# Patient Record
Sex: Female | Born: 1935 | Race: Black or African American | Hispanic: No | Smoking: Former smoker
Health system: Southern US, Community
[De-identification: ages and names within clinical notes are randomized; demographics above are authoritative.]

## PROBLEM LIST (undated history)

## (undated) DIAGNOSIS — E785 Hyperlipidemia, unspecified: Secondary | ICD-10-CM

## (undated) DIAGNOSIS — M1990 Unspecified osteoarthritis, unspecified site: Secondary | ICD-10-CM

## (undated) DIAGNOSIS — I739 Peripheral vascular disease, unspecified: Secondary | ICD-10-CM

## (undated) DIAGNOSIS — H547 Unspecified visual loss: Secondary | ICD-10-CM

## (undated) DIAGNOSIS — I2510 Atherosclerotic heart disease of native coronary artery without angina pectoris: Secondary | ICD-10-CM

## (undated) DIAGNOSIS — T7840XA Allergy, unspecified, initial encounter: Secondary | ICD-10-CM

## (undated) DIAGNOSIS — E119 Type 2 diabetes mellitus without complications: Secondary | ICD-10-CM

## (undated) DIAGNOSIS — I10 Essential (primary) hypertension: Secondary | ICD-10-CM

## (undated) DIAGNOSIS — I219 Acute myocardial infarction, unspecified: Secondary | ICD-10-CM

## (undated) DIAGNOSIS — I639 Cerebral infarction, unspecified: Secondary | ICD-10-CM

## (undated) DIAGNOSIS — H409 Unspecified glaucoma: Secondary | ICD-10-CM

## (undated) HISTORY — DX: Unspecified visual loss: H54.7

## (undated) HISTORY — DX: Unspecified glaucoma: H40.9

## (undated) HISTORY — DX: Cerebral infarction, unspecified: I63.9

## (undated) HISTORY — DX: Atherosclerotic heart disease of native coronary artery without angina pectoris: I25.10

## (undated) HISTORY — DX: Unspecified osteoarthritis, unspecified site: M19.90

## (undated) HISTORY — DX: Allergy, unspecified, initial encounter: T78.40XA

## (undated) HISTORY — DX: Hyperlipidemia, unspecified: E78.5

## (undated) HISTORY — DX: Peripheral vascular disease, unspecified: I73.9

## (undated) HISTORY — PX: EYE SURGERY: SHX253

## (undated) HISTORY — DX: Acute myocardial infarction, unspecified: I21.9

---

## 2014-04-24 ENCOUNTER — Emergency Department (HOSPITAL_COMMUNITY)
Admission: EM | Admit: 2014-04-24 | Discharge: 2014-04-24 | Disposition: A | Discharge status: Home / Self Care | Payer: Medicare Other | Attending: Emergency Medicine | Admitting: Emergency Medicine

## 2014-04-24 ENCOUNTER — Encounter (HOSPITAL_COMMUNITY): Payer: Self-pay | Admitting: Emergency Medicine

## 2014-04-24 DIAGNOSIS — Z79899 Other long term (current) drug therapy: Secondary | ICD-10-CM | POA: Diagnosis not present

## 2014-04-24 DIAGNOSIS — I10 Essential (primary) hypertension: Secondary | ICD-10-CM | POA: Diagnosis not present

## 2014-04-24 DIAGNOSIS — Z7902 Long term (current) use of antithrombotics/antiplatelets: Secondary | ICD-10-CM | POA: Diagnosis not present

## 2014-04-24 DIAGNOSIS — E119 Type 2 diabetes mellitus without complications: Secondary | ICD-10-CM | POA: Insufficient documentation

## 2014-04-24 DIAGNOSIS — I959 Hypotension, unspecified: Secondary | ICD-10-CM | POA: Diagnosis not present

## 2014-04-24 HISTORY — DX: Type 2 diabetes mellitus without complications: E11.9

## 2014-04-24 HISTORY — DX: Essential (primary) hypertension: I10

## 2014-04-24 LAB — COMPREHENSIVE METABOLIC PANEL
ALK PHOS: 102 U/L (ref 39–117)
ALT: 17 U/L (ref 0–35)
AST: 12 U/L (ref 0–37)
Albumin: 3 g/dL — ABNORMAL LOW (ref 3.5–5.2)
Anion gap: 12 (ref 5–15)
BUN: 8 mg/dL (ref 6–23)
CO2: 23 meq/L (ref 19–32)
Calcium: 9.1 mg/dL (ref 8.4–10.5)
Chloride: 101 mEq/L (ref 96–112)
Creatinine, Ser: 0.64 mg/dL (ref 0.50–1.10)
GFR, EST NON AFRICAN AMERICAN: 81 mL/min — AB (ref >90)
GLUCOSE: 224 mg/dL — AB (ref 70–99)
POTASSIUM: 3.7 meq/L (ref 3.7–5.3)
Sodium: 136 mEq/L — ABNORMAL LOW (ref 137–147)
TOTAL PROTEIN: 6.5 g/dL (ref 6.0–8.3)
Total Bilirubin: 0.6 mg/dL (ref 0.3–1.2)

## 2014-04-24 LAB — CBC WITH DIFFERENTIAL/PLATELET
BASOS ABS: 0 10*3/uL (ref 0.0–0.1)
Basophils Relative: 0 % (ref 0–1)
Eosinophils Absolute: 0.1 10*3/uL (ref 0.0–0.7)
Eosinophils Relative: 1 % (ref 0–5)
HCT: 33.5 % — ABNORMAL LOW (ref 36.0–46.0)
Hemoglobin: 11.2 g/dL — ABNORMAL LOW (ref 12.0–15.0)
LYMPHS ABS: 0.6 10*3/uL — AB (ref 0.7–4.0)
Lymphocytes Relative: 10 % — ABNORMAL LOW (ref 12–46)
MCH: 31.3 pg (ref 26.0–34.0)
MCHC: 33.4 g/dL (ref 30.0–36.0)
MCV: 93.6 fL (ref 78.0–100.0)
Monocytes Absolute: 0.4 10*3/uL (ref 0.1–1.0)
Monocytes Relative: 7 % (ref 3–12)
NEUTROS ABS: 4.8 10*3/uL (ref 1.7–7.7)
NEUTROS PCT: 82 % — AB (ref 43–77)
PLATELETS: 173 10*3/uL (ref 150–400)
RBC: 3.58 MIL/uL — AB (ref 3.87–5.11)
RDW: 12.9 % (ref 11.5–15.5)
WBC: 5.9 10*3/uL (ref 4.0–10.5)

## 2014-04-24 LAB — URINALYSIS, ROUTINE W REFLEX MICROSCOPIC
Bilirubin Urine: NEGATIVE
GLUCOSE, UA: NEGATIVE mg/dL
Hgb urine dipstick: NEGATIVE
KETONES UR: NEGATIVE mg/dL
LEUKOCYTES UA: NEGATIVE
NITRITE: NEGATIVE
PH: 7 (ref 5.0–8.0)
Protein, ur: NEGATIVE mg/dL
Specific Gravity, Urine: 1.011 (ref 1.005–1.030)
Urobilinogen, UA: 1 mg/dL (ref 0.0–1.0)

## 2014-04-24 LAB — I-STAT TROPONIN, ED: Troponin i, poc: 0.01 ng/mL (ref 0.00–0.08)

## 2014-04-24 NOTE — ED Notes (Signed)
Per EMS: Pt from home.  Moved here from [place] on Monday.  States that her BP was 110 systolic last night and this morning it was 60/40.  Initial EMS pressure on scene was 68 systolic.  IV and fluids started now up to 132/54.  Denies pain.

## 2014-04-24 NOTE — Progress Notes (Signed)
  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  BP was 110 systolic last night and this morning it was 60/40. Initial EMS pressure on scene was 68 systolic     Subjective/Objective Assessment Detail:   Pt without a [facility] pcp When pt left [place] she was provided with home health orders but has not had services started     Action/Plan:   ED CM consulted by EDP, [place] to provide medicare pcps ED CM spoke with pt and niece at bedside about medicare pcps Provided a list of in network medicare providers within zip [phone]   Action/Plan Detail:   Discussed that when pt not working her medicare is now primary coverage vs bcbs Discussed other resources for pt See note below [facility] choices provided Pending return call from Niece to provide [facility] Spoke with EDP about HH orders   Anticipated DC Date:  04/24/2014     Status Recommendation to Physician:   Result of Recommendation:    Other ED Services  Consult Working Plan    DC Planning Services  Other  Outpatient Services - Pt will follow up  PCP issues   [facility]  HOME HEALTH   Choice offered to / List presented to:  C-1 Patient     HH arranged  HH-1 RN  HH-2 PT  HH-6 SOCIAL WORKER       Status of service:  Completed, signed off  ED Comments:   ED Comments Detail:  Choice offered to / List presented to pt/niece CM reviewed in details medicare guidelines, home health [facility](HH) (length of stay in home, types of [facility] available, coverage, primary caregiver, up to 24 hrs before services may be started), CM provided family with a list of [place] home health agencies, information on delivering pharmacies, visiting doctors, chs outpatient pharmacies, DSS, health department, [facility]  services [facility] the [facility], [facility], financial assistance and medication resources Discussed EDP renewing home health orders that can be called in the home health agency of choice EDP entered home health EPIC orders for [facility], PT, SW

## 2014-04-24 NOTE — Discharge Instructions (Signed)
Hypotension  As your heart beats, it forces blood through your arteries. This force is your blood pressure. If your blood pressure is too low for you to go about your normal activities or to support the organs of your body, you have hypotension. Hypotension is also referred to as low blood pressure. When your blood pressure becomes too low, you may not get enough blood to your brain. As a result, you may feel weak, feel lightheaded, or develop a rapid heart rate. In a more severe case, you may faint.  CAUSES  Various conditions can cause hypotension. These include:  · Blood loss.  · Dehydration.  · Heart or endocrine problems.  · Pregnancy.  · Severe infection.  · Not having a well-balanced diet filled with needed nutrients.  · Severe allergic reactions (anaphylaxis).  Some medicines, such as blood pressure medicine or water pills (diuretics), may lower your blood pressure below normal. Sometimes taking too much medicine or taking medicine not as directed can cause hypotension.  TREATMENT   Hospitalization is sometimes required for hypotension if fluid or blood replacement is needed, if time is needed for medicines to wear off, or if further monitoring is needed. Treatment might include changing your diet, changing your medicines (including medicines aimed at raising your blood pressure), and use of support stockings.  HOME CARE INSTRUCTIONS   · Drink enough fluids to keep your urine clear or pale yellow.  · Take your medicines as directed by your health care provider.  · Get up slowly from reclining or sitting positions. This gives your blood pressure a chance to adjust.  · Wear support stockings as directed by your health care provider.  · Maintain a healthy diet by including nutritious food, such as fruits, vegetables, nuts, whole grains, and lean meats.  SEEK MEDICAL CARE IF:  · You have vomiting or diarrhea.  · You have a fever for more than 2-3 days.  · You feel more thirsty than usual.  · You feel weak and  tired.  SEEK IMMEDIATE MEDICAL CARE IF:   · You have chest pain or a fast or irregular heartbeat.  · You have a loss of feeling in some part of your body, or you lose movement in your arms or legs.  · You have trouble speaking.  · You become sweaty or feel lightheaded.  · You faint.  MAKE SURE YOU:   · Understand these instructions.  · Will watch your condition.  · Will get help right away if you are not doing well or get worse.  Document Released: 09/05/2005 Document Revised: 06/26/2013 Document Reviewed: 03/08/2013  ExitCare® Patient Information ©2015 [employer]. This information is not intended to replace advice given to you by your health care provider. Make sure you discuss any questions you have with your health care provider.

## 2014-04-24 NOTE — ED Provider Notes (Signed)
  Face-to-face evaluation   History: EMS was called, this morning, because the patient was talking slowly and "almost passed out". EMS arrived and found her to be hypotensive. She arrived from [place] about 4 days ago, and family members are helping her. She has a large bag of medicine, with many duplicate medicines and the family member with her, is not sure if she is taking some or all of these medicines. When I saw the patient, at 13:45, she was alert, conversant, and stated she was hungry. She denied chest pain, nausea, vomiting, weakness, or dizziness. She recalls speaking slowly, earlier, but feels that she is talking normally at this time.  Physical exam: Elderly female in no apparent distress. Heart regular in rhythm. No murmur. Lungs clear to auscultation. Neurologic is without focal asymmetry. She is alert and cooperative. There is no dysarthria or aphasia.  Medical screening examination/treatment/procedure(s) were conducted as a shared visit with non-physician practitioner(s) and myself.  I personally evaluated the patient during the encounter  Flint MelterElliott L Cleatus Gabriel, MD 04/25/14 2136

## 2014-04-24 NOTE — ED Notes (Signed)
I tried getting blood from patient but was un successful.

## 2014-04-24 NOTE — ED Notes (Signed)
Bed: [id number] Expected date:  Expected time:  Means of arrival:  Comments: 60/40

## 2014-04-24 NOTE — ED Provider Notes (Signed)
CSN: [id number]     Arrival date & time 04/24/14  1124 History   First MD Initiated Contact with Patient 04/24/14 1137     Chief Complaint  Patient presents with  . Hypotension     (Consider location/radiation/quality/duration/timing/severity/associated sxs/prior Treatment) HPI Comments: Patient brought in today with a chief complaint of hypotension.  She reports that she has been having fatigue and generalized weakness since last evening.  She reports that this morning she was talking more slowly than usual and her niece then called EMS.  Upon EMS arrival the patient was found to have a blood pressure of 60/40.  She reports that at that time she felt like she was going to "pass out."  She was given IVF and blood pressure improved.  Blood pressure 142/82 upon arrival in the ED.  She denies focal weakness, dizziness, lightheadedness, vomiting, diarrhea, chest pain, headache, vision changes, fever, chills, SOB, or abdominal pain.  She reports that no new changes to her medications.  She is currently on Clonidine, Amlodipine, and Labetalol for HTN.  She thinks that she has been taking medications as prescribed.  She reports that she has been eating and drinking normally.  [place] one week ago.    The history is provided by the patient.    Past Medical History  Diagnosis Date  . Diabetes mellitus without complication   . Hypertension    History reviewed. No pertinent past surgical history. History reviewed. No pertinent family history. History  Substance Use Topics  . Smoking status: Never Smoker   . Smokeless tobacco: Not on file  . Alcohol Use: No   OB History   Grav Para Term Preterm Abortions TAB SAB Ect Mult Living                 Review of Systems  All other systems reviewed and are negative.     Allergies  Milk-related compounds  Home Medications   Prior to Admission medications   Medication Sig Start Date End Date Taking?  Authorizing Provider  acetaminophen (TYLENOL) 500 MG tablet Take 500 mg by mouth every 6 (six) hours as needed.   Yes Historical Provider, MD  ALPRAZolam Prudy Feeler(XANAX) 0.5 MG tablet Take 0.5 mg by mouth 3 (three) times daily as needed for anxiety.   Yes Historical Provider, MD  amLODipine (NORVASC) 10 MG tablet Take 10 mg by mouth daily.   Yes Historical Provider, MD  calcium carbonate (TUMS - DOSED IN MG ELEMENTAL CALCIUM) 500 MG chewable tablet Chew 1 tablet by mouth daily as needed for indigestion or heartburn.   Yes Historical Provider, MD  cloNIDine (CATAPRES) 0.2 MG tablet Take 0.2 mg by mouth every 8 (eight) hours.   Yes Historical Provider, MD  clopidogrel (PLAVIX) 75 MG tablet Take 75 mg by mouth daily.   Yes Historical Provider, MD  docusate sodium (COLACE) 100 MG capsule Take 300 mg by mouth at bedtime.   Yes Historical Provider, MD  famotidine (PEPCID) 20 MG tablet Take 20 mg by mouth daily.   Yes Historical Provider, MD  haloperidol (HALDOL) 0.5 MG tablet Take 0.5-1 mg by mouth 3 (three) times daily.   Yes Historical Provider, MD  hydrALAZINE (APRESOLINE) 10 MG tablet Take 10 mg by mouth 3 (three) times daily.   Yes Historical Provider, MD  labetalol (NORMODYNE) 200 MG tablet Take 400 mg by mouth 3 (three) times daily.   Yes Historical Provider, MD  omeprazole (PRILOSEC) 20 MG  capsule Take 20 mg by mouth daily.   Yes Historical Provider, MD  phenytoin (DILANTIN) 100 MG ER capsule Take 100 mg by mouth 2 (two) times daily.   Yes Historical Provider, MD  simvastatin (ZOCOR) 20 MG tablet Take 20 mg by mouth at bedtime.   Yes Historical Provider, MD  nitrofurantoin, macrocrystal-monohydrate, (MACROBID) 100 MG capsule Take 100 mg by mouth 2 (two) times daily. Starting 04/16/2014 for 7 days. 04/16/14   Historical Provider, MD   BP 142/82  Pulse 65  Temp(Src) 97.4 F (36.3 C) (Oral)  Resp 15  SpO2 100% Physical Exam  Nursing note and vitals reviewed. Constitutional: She is oriented to person,  place, and time. She appears well-developed and well-nourished.  HENT:  Head: Normocephalic and atraumatic.  Mouth/Throat: Oropharynx is clear and moist.  Eyes: EOM are normal. Pupils are equal, round, and reactive to light.  Neck: Normal range of motion. Neck supple.  Cardiovascular: Normal rate, regular rhythm and normal heart sounds.   Pulmonary/Chest: Effort normal and breath sounds normal.  Abdominal: Soft. Bowel sounds are normal. She exhibits no distension and no mass. There is no tenderness. There is no rebound and no guarding.  Musculoskeletal: Normal range of motion.  Neurological: She is alert and oriented to person, place, and time. She has normal strength. No cranial nerve deficit or sensory deficit. Coordination normal.  Normal gait with walker  Skin: Skin is warm and dry.  Psychiatric: She has a normal mood and affect.    ED Course  Procedures (including critical care time) Labs Review Labs Reviewed  CBC WITH DIFFERENTIAL  COMPREHENSIVE METABOLIC PANEL  URINALYSIS, ROUTINE W REFLEX MICROSCOPIC  I-STAT TROPOININ, ED    Imaging Review No results found.   EKG Interpretation None     Filed Vitals:   04/24/14 1359  BP: 164/67  Pulse: 63  Temp:   Resp: 13   2:00 Pm Reassessed patient.  Patient has been able to tolerate PO fluids and eat. 2:30 PM Patient ambulated without difficulty using her walker.  MDM   Final diagnoses:  None   Patient presenting with a chief complaint of hypotension.  She was found to be hypotensive by EMS, however, she was given IVF and blood pressure improved.  Blood pressure 142/82 upon arrival in the ED.  Blood pressure remained stable during ED course.  Labs today unremarkable.  UA negative.  No focal deficits on exam.  Patient able to eat and drink prior to discharge.  Patient able to ambulate using her walker.  Patient stable for discharge.  Return precautions given.  Patient also evaluated by Dr. Effie Shy who is in agreement with  the plan.    Santiago Glad, PA-C 04/25/14 [phone]

## 2014-04-24 NOTE — ED Notes (Signed)
Patient tried to urinate, but was unable to.

## 2014-04-24 NOTE — ED Notes (Signed)
Pt ambulated well with walker.

## 2014-05-03 ENCOUNTER — Emergency Department (HOSPITAL_COMMUNITY): Payer: Federal, State, Local not specified - PPO

## 2014-05-03 ENCOUNTER — Emergency Department (HOSPITAL_COMMUNITY)
Admission: EM | Admit: 2014-05-03 | Discharge: 2014-05-03 | Disposition: A | Discharge status: Home / Self Care | Payer: Federal, State, Local not specified - PPO | Attending: Emergency Medicine | Admitting: Emergency Medicine

## 2014-05-03 ENCOUNTER — Encounter (HOSPITAL_COMMUNITY): Payer: Self-pay | Admitting: Emergency Medicine

## 2014-05-03 DIAGNOSIS — I10 Essential (primary) hypertension: Secondary | ICD-10-CM | POA: Insufficient documentation

## 2014-05-03 DIAGNOSIS — Z79899 Other long term (current) drug therapy: Secondary | ICD-10-CM | POA: Insufficient documentation

## 2014-05-03 DIAGNOSIS — M6281 Muscle weakness (generalized): Secondary | ICD-10-CM | POA: Diagnosis not present

## 2014-05-03 DIAGNOSIS — E119 Type 2 diabetes mellitus without complications: Secondary | ICD-10-CM | POA: Insufficient documentation

## 2014-05-03 DIAGNOSIS — R55 Syncope and collapse: Secondary | ICD-10-CM | POA: Diagnosis not present

## 2014-05-03 DIAGNOSIS — R9431 Abnormal electrocardiogram [ECG] [EKG]: Secondary | ICD-10-CM

## 2014-05-03 DIAGNOSIS — Z7901 Long term (current) use of anticoagulants: Secondary | ICD-10-CM | POA: Insufficient documentation

## 2014-05-03 LAB — COMPREHENSIVE METABOLIC PANEL
ALK PHOS: 110 U/L (ref 39–117)
ALT: 8 U/L (ref 0–35)
AST: 11 U/L (ref 0–37)
Albumin: 3.4 g/dL — ABNORMAL LOW (ref 3.5–5.2)
Anion gap: 14 (ref 5–15)
BUN: 14 mg/dL (ref 6–23)
CHLORIDE: 102 meq/L (ref 96–112)
CO2: 24 meq/L (ref 19–32)
Calcium: 9.6 mg/dL (ref 8.4–10.5)
Creatinine, Ser: 0.76 mg/dL (ref 0.50–1.10)
GFR calc Af Amer: 89 mL/min — ABNORMAL LOW (ref >90)
GFR, EST NON AFRICAN AMERICAN: 77 mL/min — AB (ref >90)
Glucose, Bld: 155 mg/dL — ABNORMAL HIGH (ref 70–99)
POTASSIUM: 4.2 meq/L (ref 3.7–5.3)
SODIUM: 140 meq/L (ref 137–147)
Total Bilirubin: 0.4 mg/dL (ref 0.3–1.2)
Total Protein: 7.1 g/dL (ref 6.0–8.3)

## 2014-05-03 LAB — CBC WITH DIFFERENTIAL/PLATELET
Basophils Absolute: 0 10*3/uL (ref 0.0–0.1)
Basophils Relative: 0 % (ref 0–1)
Eosinophils Absolute: 0.1 10*3/uL (ref 0.0–0.7)
Eosinophils Relative: 1 % (ref 0–5)
HEMATOCRIT: 38.6 % (ref 36.0–46.0)
HEMOGLOBIN: 13.1 g/dL (ref 12.0–15.0)
Lymphocytes Relative: 9 % — ABNORMAL LOW (ref 12–46)
Lymphs Abs: 0.7 10*3/uL (ref 0.7–4.0)
MCH: 31.8 pg (ref 26.0–34.0)
MCHC: 33.9 g/dL (ref 30.0–36.0)
MCV: 93.7 fL (ref 78.0–100.0)
MONOS PCT: 6 % (ref 3–12)
Monocytes Absolute: 0.5 10*3/uL (ref 0.1–1.0)
Neutro Abs: 6.5 10*3/uL (ref 1.7–7.7)
Neutrophils Relative %: 84 % — ABNORMAL HIGH (ref 43–77)
Platelets: 216 10*3/uL (ref 150–400)
RBC: 4.12 MIL/uL (ref 3.87–5.11)
RDW: 13.1 % (ref 11.5–15.5)
WBC: 7.8 10*3/uL (ref 4.0–10.5)

## 2014-05-03 LAB — TROPONIN I
Troponin I: <0.3 ng/mL (ref <0.30)
Troponin I: <0.3 ng/mL (ref <0.30)

## 2014-05-03 LAB — PRO B NATRIURETIC PEPTIDE: Pro B Natriuretic peptide (BNP): 81.7 pg/mL (ref 0–450)

## 2014-05-03 MED ORDER — SODIUM CHLORIDE 0.9 % IV BOLUS (SEPSIS)
500.0000 mL | Freq: Once | INTRAVENOUS | Status: AC
  Administered 2014-05-03: 500 mL via INTRAVENOUS

## 2014-05-03 NOTE — ED Notes (Signed)
Bed: [id number] Expected date: 05/03/14 Expected time: 9:52 AM Means of arrival: Ambulance Comments: Near syncope

## 2014-05-03 NOTE — Consult Note (Signed)
[facility] Medical Consultation  Kaitlin Smith RUE:[id number] DOB: 03/26/1930 DOA: 05/03/2014 PCP: No PCP Per Patient   Requesting physician: Dr. Elesa Massed Date of consultation: 05/03/14 Reason for consultation: near syncope and T-waves inversions on EKG  Impression/Recommendations 1. Near syncope: most likely vaso-vagal in nature vs secondary to medications causing orthostatic changes. Family report that when EMs arrived and check Vital signs BP was low. Patient is also no walking or doing much of activity at all. -troponin neg, no SOB, EKG w/o any other acute ischemic process -cardiology curbside for EKG abnormality and recommending outpatient follow up.  -will need event monitoring -had already appointment set up to establish care with PCP on 05/05/14; at that moment will recommend medication adjustments for BP and evaluation of adrenal function. -check TSH -patient otherwise stable and ok to be discharge from ED.  2. Abnormal EKG: non-specific and with lack of further symptoms or abnormalities will not require further inpatient evaluation at this time. See above cardiology rec's  *Rest of medical problems remains stable and will recommend to continue current medication regimen and follow to establish care with primary provider and with cardiology as an outpatient.  Please contact me if I can be of assistance in the meanwhile. Thank you for this consultation.  Chief Complaint: near syncope   HPI:  Patient with pmh of HTN, HLD, diabetes and CAD; came to Ed after experiencing near syncope event. Patient relocated recently from [place] and will be staying in town now with nieces and nephews. Patient reports she woke this morning, walk downstairs and went to the bathroom and after standing when done felt as if her knees were giving up and felt herself fainting. She never loss consciousness or hit her head. Niece who was with her reports patient was able to heard them and follow commands during  event. Patient is wheelchair bound mainly and not used to do as much activities as she did today prior to event. Patient reports some constipation (which is not new for her); otherwise denies CP, palpitations, SOB, nausea, vomiting, HA's, abd pain, dysuria or any other complaints.  In the ED work up unremarkable and after IVF's give, patient was asymptomatic.  Review of Systems:  Negative except as mentioned otherwise on HPI  Past Medical History  Diagnosis Date  . Diabetes mellitus without complication   . Hypertension    History reviewed. No pertinent past surgical history. Social History:  reports that she has never smoked. She does not have any smokeless tobacco history on file. She reports that she does not drink alcohol or use illicit drugs.  Allergies  Allergen Reactions  . Milk-Related Compounds     Lactose intolerant.     FH: positive for HTN; otherwise non-contributory  Prior to Admission medications   Medication Sig Start Date End Date Taking? Authorizing Provider  acetaminophen (TYLENOL) 500 MG tablet Take 500 mg by mouth every 6 (six) hours as needed.   Yes Historical Provider, MD  amLODipine (NORVASC) 10 MG tablet Take 10 mg by mouth daily.   Yes Historical Provider, MD  cloNIDine (CATAPRES) 0.2 MG tablet Take 0.2 mg by mouth every 8 (eight) hours.   Yes Historical Provider, MD  clopidogrel (PLAVIX) 75 MG tablet Take 75 mg by mouth daily.   Yes Historical Provider, MD  docusate sodium (COLACE) 100 MG capsule Take 300 mg by mouth at bedtime.   Yes Historical Provider, MD  haloperidol (HALDOL) 0.5 MG tablet Take 0.5-1 mg by mouth 3 (three) times daily.  Yes Historical Provider, MD  hydrALAZINE (APRESOLINE) 10 MG tablet Take 10 mg by mouth 3 (three) times daily.   Yes Historical Provider, MD  labetalol (NORMODYNE) 200 MG tablet Take 400 mg by mouth 3 (three) times daily.   Yes Historical Provider, MD  omeprazole (PRILOSEC) 20 MG capsule Take 20 mg by mouth daily.   Yes  Historical Provider, MD  simvastatin (ZOCOR) 20 MG tablet Take 20 mg by mouth at bedtime.   Yes Historical Provider, MD   Physical Exam: Blood pressure 167/60, pulse 73, temperature 98.2 F (36.8 C), temperature source Oral, resp. rate 18, SpO2 100.00%. Filed Vitals:   05/03/14 1238  BP: 167/60  Pulse: 73  Temp: 98.2 F (36.8 C)  Resp: 18     General:  Legally blind, AAOX3, afebrile, able to follow commands properly and denying any CP, SOB, palpitations, nausea, vomiting, abd pain or dysuria  Eyes: no icterus, no nystagmus, pupils are equally round and reactive to light  ENT: no erythema, no exudates; no thrush; patient w/o nasal congestion or ears discharge  Neck: no JVD, no bruits.  Cardiovascular: regular rate, no rubs or gallops, s1 and s2 appreciated   Respiratory: CTA bilaterally  Abdomen: soft, NT, ND, positive BS  Skin: no rash, petechiae or open wounds appreciated on exam  Musculoskeletal: no joint swelling or erythema  Psychiatric: normal mood and affect  Neurologic: patient blind, otherwise no focal motor or sensory deficit seen on exam   Labs on Admission:  Basic Metabolic Panel:  Recent Labs Lab 05/03/14 1025  NA 140  K 4.2  CL 102  CO2 24  GLUCOSE 155*  BUN 14  CREATININE 0.76  CALCIUM 9.6   Liver Function Tests:  Recent Labs Lab 05/03/14 1025  AST 11  ALT 8  ALKPHOS 110  BILITOT 0.4  PROT 7.1  ALBUMIN 3.4*   CBC:  Recent Labs Lab 05/03/14 1025  WBC 7.8  NEUTROABS 6.5  HGB 13.1  HCT 38.6  MCV 93.7  PLT 216   Cardiac Enzymes:  Recent Labs Lab 05/03/14 1025  TROPONINI <0.30   Radiological Exams on Admission: Dg Chest 2 View  05/03/2014   CLINICAL DATA:  Near syncope.  Hypertension.  EXAM: CHEST  2 VIEW  COMPARISON:  None.  FINDINGS: Cardiac silhouette areas normal in size. No mediastinal or hilar masses. Lungs are clear. No pleural effusion or pneumothorax.  Bony thorax is demineralized but intact.  IMPRESSION: No acute  cardiopulmonary disease.   Electronically Signed   By: Amie Portlandavid  Ormond M.D.   On: 05/03/2014 11:01    EKG:  Slight PR interval prolongation; left axis, sinus rhythm and regular rate. Non-specific T waves inversions on lateral leads.  Time spent: 45 minutes  Vassie LollMadera, Aviv Lengacher [facility] Pager [phone]  If 7PM-7AM, please contact night-coverage [email or web address] Password [email or web address] 05/03/2014, 2:07 PM

## 2014-05-03 NOTE — Discharge Instructions (Signed)
Please call your doctor for a followup appointment within 24-48 hours. When you talk to your doctor please let them know that you were seen in the emergency department and have them acquire all of your records so that they can discuss the findings with you and formulate a treatment plan to fully care for your new and ongoing problems. Please keep appointment with primary care provider on 05/05/2014 - please review over medications Please call and set-up an appointment with Cardiology - may ned to be placed on an event monitor Please continue to rest and stay hydrated  Please avoid any physical or strenuous activity  Please continue to monitor symptoms closely and if symptoms are to worsen or change (fever greater than 101, chills, sweating, nausea, vomiting, chest pain, shortness of breath, difficulty breathing, weakness, numbness, tingling, neck pain, neck stiffness, fall, injury, fainting) please report back to the ED immediately   Near-Syncope Near-syncope (commonly known as near fainting) is sudden weakness, dizziness, or feeling like you might pass out. During an episode of near-syncope, you may also develop pale skin, have tunnel vision, or feel sick to your stomach (nauseous). Near-syncope may occur when getting up after sitting or while standing for a long time. It is caused by a sudden decrease in blood flow to the brain. This decrease can result from various causes or triggers, most of which are not serious. However, because near-syncope can sometimes be a sign of something serious, a medical evaluation is required. The specific cause is often not determined. HOME CARE INSTRUCTIONS  Monitor your condition for any changes. The following actions may help to alleviate any discomfort you are experiencing:  Have someone stay with you until you feel stable.  Lie down right away and prop your feet up if you start feeling like you might faint. Breathe deeply and steadily. Wait until all the symptoms  have passed. Most of these episodes last only a few minutes. You may feel tired for several hours.   Drink enough fluids to keep your urine clear or pale yellow.   If you are taking blood pressure or heart medicine, get up slowly when seated or lying down. Take several minutes to sit and then stand. This can reduce dizziness.  Follow up with your health care provider as directed. SEEK IMMEDIATE MEDICAL CARE IF:   You have a severe headache.   You have unusual pain in the chest, abdomen, or back.   You are bleeding from the mouth or rectum, or you have black or tarry stool.   You have an irregular or very fast heartbeat.   You have repeated fainting or have seizure-like jerking during an episode.   You faint when sitting or lying down.   You have confusion.   You have difficulty walking.   You have severe weakness.   You have vision problems.  MAKE SURE YOU:   Understand these instructions.  Will watch your condition.  Will get help right away if you are not doing well or get worse. Document Released: 09/05/2005 Document Revised: 09/10/2013 Document Reviewed: 02/08/2013 [facility] Patient Information 2015 [place], [place]. This information is not intended to replace advice given to you by your health care provider. Make sure you discuss any questions you have with your health care provider.   Emergency Department Resource Guide 1) Find a Doctor and Pay Out of Pocket Although you won't have to find out who is covered by your insurance plan, it is a good idea to ask around and  get recommendations. You will then need to call the office and see if the doctor you have chosen will accept you as a new patient and what types of options they offer for patients who are self-pay. Some doctors offer discounts or will set up payment plans for their patients who do not have insurance, but you will need to ask so you aren't surprised when you get to your appointment.  2) Contact  Your Local Health Department Not all health departments have doctors that can see patients for sick visits, but many do, so it is worth a call to see if yours does. If you don't know where your local health department is, you can check in your phone book. The CDC also has a tool to help you locate your state's health department, and many state websites also have listings of all of their local health departments.  3) Find a Walk-in Clinic If your illness is not likely to be very severe or complicated, you may want to try a walk in clinic. These are popping up all over the country in pharmacies, drugstores, and shopping centers. They're usually staffed by nurse practitioners or physician assistants that have been trained to treat common illnesses and complaints. They're usually fairly quick and inexpensive. However, if you have serious medical issues or chronic medical problems, these are probably not your best option.  No Primary Care Doctor: - Call Health Connect at  [phone] - they can help you locate a primary care doctor that  accepts your insurance, provides certain services, etc. - Physician Referral Service- [phone]  Chronic Pain Problems: Organization         Address  Phone   Notes  [facility]  [phone]) [phone] Patients need to be referred by their primary care doctor.   Medication Assistance: Organization         Address  Phone   Notes  [facility] [address] [place], [place] [address] [phone] --Must be a resident of [place] -- Must have NO insurance coverage whatsoever (no Medicaid/ Medicare, etc.) -- The pt. MUST have a primary care doctor that directs their care regularly and follows them in the community   Med[facility]ist  [phone]   [employer]  [phone]    Agencies that provide inexpensive medical care: Organization         Address  Phone   Notes  [facility]  [phone])  [phone]   [facility]    [phone]   [facility] [address] [place], [place] [address] [phone]) [phone]   [facility] [address], [place] 660 [phone]   [facility]anned Parenthood    [phone](336) [phone]   [facility]    [phone]   [facility]  [address], [place] Phone:  [phone]) [phone], Fax:  [phone](336) [phone] Hours of Operation:  9 am - 6 pm, M-F.  Also accepts Medicaid/Medicare and self-pay.  [facility]  [address], [place] Phone: [phone]) [phone], Fax: [phone]) [phone]. Hours of Operation:  8:30 am - 5:30 pm, M-F.  Also accepts Medicaid and self-pay.  [facility] [place] [address], [place] Phone: [phone](336) [phone]   [facility] [address], [place], [place] [phone], Ext. [phone] Mondays & Thursdays: 7-9 AM.  First 15 patients are seen on a  first come, first serve basis.    Medicaid-accepting [facility] Providers:  Organization         Address  Phone   Notes  [facility] [address], [place] [phone] Also accepts self-pay patients.  [facility] [address] Laurell Josephs [place], [place]  [phone]   [facility] [address], [place] [phone]   [facility] [address], [place] [phone]   Renaye Rakers [address], [place]   [phone] Only accepts [place] Access [place] patients after they have their name applied to their card.   Self-Pay (no insurance) in [facility]:  Organization         Address  Phone   Notes  Sickle Cell Patients, [facility] [address] [place], [place] [phone]   [facility] [address] [place], [place] [phone]   [facility] [place]  [address], [place] [phone]   [facility]/Dr. Osei-Bonsu  [address], [place] or [address], [place] [phone] Phone number for both [place] and [place] locations is the same.  [facility] and [facility] [address] [place] [phone]   [facility] [address] [place] or [address] [phone]   [facility] [address], [place] [phone], phone; [phone], fax Sees patients 1st and 3rd Saturday of every month.  Must not qualify for public or private insurance (i.e. Medicaid, Medicare, [place] Health Choice, Veterans' Benefits)  Household income should be no more than 200% of the poverty level The clinic cannot treat you if you are pregnant or think you are pregnant  Sexually transmitted diseases are not treated at the clinic.    Dental Care: Organization         Address  Phone  Notes  [facility] Department of [facility] [address] [place], [place] [phone] Accepts children up to age 9 who are enrolled in [place] or [place] Health Choice; pregnant women with a Medicaid card; and children who have applied for Medicaid or [place] Health Choice, but were declined, whose parents can pay a reduced fee at time of service.  [facility] Department of [facility]  [address], [place] [phone] Accepts children up to age 47 who are enrolled in [place] or [place] Health Choice; pregnant women with a Medicaid card; and children who have applied for Medicaid or  Health Choice, but were declined, whose parents can pay a reduced fee at time of service.  [facility]  [address] [place], [place] [phone] Patients are seen by appointment only. Walk-ins are not accepted. [facility] will see patients 38 years of age and older. Monday - Tuesday (8am-5pm) Most  Wednesdays (8:30-5pm) $30 per visit, cash only  [facility] Access PRO[facility]  [address], [facility] [phone] Patients are seen by appointment only. Walk-ins are not accepted. [facility] will see patients 34 years of age and older. One Wednesday Evening (Monthly: Volunteer Based).  $30 per visit, cash only  [employer]  [phone] for adults; Children under age 92, call Graduate Pediatric Dentistry at [phone]. Children aged  4-14, please call [phone] to request a pediatric application.  Dental services are provided in all areas of dental care including fillings, crowns and bridges, complete and partial dentures, implants, gum treatment, root canals, and extractions. Preventive care is also provided. Treatment is provided to both adults and children. Patients are selected via a lottery and there is often a waiting list.   [facility] [address], [place]  [phone] [email or web address]   [facility] [address] [place], [place] [phone], Ext. [phone] Second and Fourth Thursday of each month, opens at 6:30 AM; Clinic ends at 9 AM.  Patients are seen on a first-come first-served basis, and a limited number are seen during each clinic.   [facility]  [address] Ether Griffins [place], [place] [phone]   Eligibility Requirements You must have lived in [place], [place], or [place] counties for at least the last three months.   You cannot be eligible for state or federal sponsored [place], including [employer], [place], or [employer].   You generally cannot be eligible for healthcare insurance through your employer.    How to apply: Eligibility screenings are held every Tuesday and Wednesday afternoon from 1:00 pm until 4:00 pm. You do not need an appointment for the interview!  [facility] [address], [place], [place] [phone]     [facility] Health Department  [phone]   [facility] Health Department  [phone]   [facility] Health Department  [phone]    Behavioral Health Resources in the Community: Intensive Outpatient Programs Organization         Address  Phone  Notes  [facility] Services [address] [place], [place] [phone]   [facility]patient [address] [place], [place] [phone]   [facility]: [facility] S[facility] [address], [place], [place]  [phone]   [facility] Mental Health [address]  [place], [place] [phone] or [phone]   Substance Abuse Resources Organization         Address  Phone  Notes  Al[facility] Drug Services  [phone]   [facility]  [phone]   [facility]  [phone]   Floydene Flock  [phone]   Residential & Outpatient Substance Abuse Program  [phone]   Psychological Services Organization         Address  Phone  Notes  [facility] Behavioral Health  [phone]   [facility] Services  [phone]   [facility] Mental Health [address] [place] [phone] or [phone]    Mobile Crisis Teams Organization         Address  Phone  Notes  [facility], Mobile Crisis Care Unit  [phone]   [facility]  [address] [place], [place] [phone]   Doristine Locks [address] [place] [phone]    Self-Help/Support Groups Organization         Address  Phone             Notes  [facility] [place] - variety of support groups  [phone]- I7437963 Call for more information  Narcotics Anonymous (NA), [facility] [address], [employer] [place]  2 meetings at this location   Statistician         Address  Phone  Notes  [facility] [address],    [place]  [phone]  [facility]  [address] [place]  [phone], [place], [place]   [place], [place] [phone] Admissions: 8am-3pm M-F  Incentives Substance [place] [address],    [place], [place] [phone]   [facility] [address] [place], [place], [place]   [facility] [address]  [place], [place]   [facility] - Intensive Outpatient [address], [place], [place]   [facility] ([address].,  [place], [place] [phone] or [phone]   [facility] (RTS) [address], [place], [place] Accepts Medicaid  [facility] [address]  [place] [phone] Substance Abuse/Addiction Treatment   [facility] Organization         Address  Phone  Notes  [facility]  [phone]   Domenic Schwab, PhD [address] Arlis Porta [place], [place]   [phone] or [phone]   [place] Jennette [place], [place] [phone]   [facility] [address] [place], [place] [phone] Insurance/Medicaid/sponsorship through [facility] and Families [address], [address]                                    [place], [place] [phone] [place] [address], [place] [phone]    Dr. Adele Schilder  [phone]   [facility] Dept. 1) [address], [place] 2) [place] 3)  [address], [place] [phone]  [phone]   [place] [phone] or [phone] (After Hours)

## 2014-05-03 NOTE — ED Provider Notes (Signed)
Medical screening examination/treatment/procedure(s) were conducted as a shared visit with non-physician practitioner(s) and myself.  I personally evaluated the patient during the encounter.   EKG Interpretation   Date/Time:  Saturday May 03 2014 10:10:17 EDT Ventricular Rate:  71 PR Interval:  239 QRS Duration: 99 QT Interval:  399 QTC Calculation: 434 R Axis:   -35 Text Interpretation:  Sinus rhythm Prolonged PR interval Left axis  deviation Probable anteroseptal infarct, old T wave inversions in lateral  leads Confirmed by Roshanna Cimino,  DO, Mck[place]ley Cottier ([id number]) on 05/03/2014 10:25:06 AM      Pt is a 80 y.o. F with history of hypertension, diabetes, CHF, CAD on Plavix who presents to the emergency department after a near syncopal event. She states that she went to the bathroom and when she got up she felt very lightheaded. She walked down the stairs but did not syncopized. She denies any chest pain, shortness of breath or palpitations. She is not orthostatic in the emergency department. She is recently here from out of town living with her family. She has EKG abnormalities but no old for comparison. Labs are unremarkable including negative troponin x 2 and normal BNP. Chest x-ray clear. Patient is still asymptomatic and hemodynamically stable. Orthostatic vital signs are negative. Discussed with cardiology on call Dr. Melburn PopperNasher who feels this can be managed as an outpatient. Hospitalist Dr Gwenlyn PerkingMadera has seen the patient and agrees. Patient has an appointment with her PCP in 2 days. Family comfortable with this plan. Discussed return precautions.  Layla MawKristen N Tremell Reimers, DO 05/03/14 1429

## 2014-05-03 NOTE — ED Notes (Signed)
PA at bedside.

## 2014-05-03 NOTE — ED Notes (Signed)
Provided pt with a sandwich per PA request. Also provided family with drinks and snacks while they are waiting on labs to result and d/c papers

## 2014-05-03 NOTE — ED Provider Notes (Signed)
CSN: [id number]     Arrival date & time 05/03/14  4098 History   First MD Initiated Contact with Patient 05/03/14 [id number]     Chief Complaint  Patient presents with  . Near Syncope     (Consider location/radiation/quality/duration/timing/severity/associated sxs/prior Treatment) The history is provided by the patient. No language interpreter was used.  Kaitlin Smith is an 80 y/o F with PMHx of DM, HTN presenting to the ED with a near syncopal episode that occurred this morning prior to arrival to the ED. Patient reported that she went to the bathroom and while washing her hands she had sudden onset of generalized weakness and felt as if she was going to faint. Patient reported that she is currently living with her niece, but stated that she was at her nephew's house this morning who was the individual that called EMS. Stated that she feels generalized weakness all over. Stated that she is wheelchair bound. Denied, fall, injuries, head injury, LOC, syncopal episode, chest pain, shortness of breath, difficulty breathing, numbness, tingling, neck pain, neck stiffness, abdominal pain, nausea, vomiting, diarrhea, melena, hematochezia, urinary symptoms, headache, dizziness, visual changes.  PCP none   Past Medical History  Diagnosis Date  . Diabetes mellitus without complication   . Hypertension    History reviewed. No pertinent past surgical history. No family history on file. History  Substance Use Topics  . Smoking status: Never Smoker   . Smokeless tobacco: Not on file  . Alcohol Use: No   OB History   Grav Para Term Preterm Abortions TAB SAB Ect Mult Living                 Review of Systems  Constitutional: Negative for fever and chills.  Respiratory: Negative for cough, chest tightness and shortness of breath.   Cardiovascular: Negative for chest pain.  Gastrointestinal: Negative for nausea, vomiting, abdominal pain, diarrhea, constipation, blood in stool and anal bleeding.   Musculoskeletal: Negative for back pain, neck pain and neck stiffness.  Neurological: Positive for weakness (Generalized). Negative for dizziness, syncope, numbness and headaches.      Allergies  Milk-related compounds  Home Medications   Prior to Admission medications   Medication Sig Start Date End Date Taking? Authorizing Provider  acetaminophen (TYLENOL) 500 MG tablet Take 500 mg by mouth every 6 (six) hours as needed.   Yes Historical Provider, MD  amLODipine (NORVASC) 10 MG tablet Take 10 mg by mouth daily.   Yes Historical Provider, MD  cloNIDine (CATAPRES) 0.2 MG tablet Take 0.2 mg by mouth every 8 (eight) hours.   Yes Historical Provider, MD  clopidogrel (PLAVIX) 75 MG tablet Take 75 mg by mouth daily.   Yes Historical Provider, MD  docusate sodium (COLACE) 100 MG capsule Take 300 mg by mouth at bedtime.   Yes Historical Provider, MD  haloperidol (HALDOL) 0.5 MG tablet Take 0.5-1 mg by mouth 3 (three) times daily.   Yes Historical Provider, MD  hydrALAZINE (APRESOLINE) 10 MG tablet Take 10 mg by mouth 3 (three) times daily.   Yes Historical Provider, MD  labetalol (NORMODYNE) 200 MG tablet Take 400 mg by mouth 3 (three) times daily.   Yes Historical Provider, MD  omeprazole (PRILOSEC) 20 MG capsule Take 20 mg by mouth daily.   Yes Historical Provider, MD  simvastatin (ZOCOR) 20 MG tablet Take 20 mg by mouth at bedtime.   Yes Historical Provider, MD   BP 145/62  Pulse 77  Temp(Src) 98.2 F (36.8 C) (Oral)  Resp 18  SpO2 97% Physical Exam  Nursing note and vitals reviewed. Constitutional: She is oriented to person, place, and time. She appears well-developed and well-nourished. No distress.  Patient laying comfortably in bed without any signs of distress  HENT:  Head: Normocephalic and atraumatic.  Mouth/Throat: Oropharynx is clear and moist. No oropharyngeal exudate.  Eyes: Conjunctivae and EOM are normal. Pupils are equal, round, and reactive to light. Right eye  exhibits no discharge. Left eye exhibits no discharge.  Neck: Normal range of motion. Neck supple. No tracheal deviation present.  Negative neck stiffness Negative nuchal rigidity  Negative cervical lymphadenopathy  Negative meningeal signs   Cardiovascular: Normal rate, regular rhythm and normal heart sounds.  Exam reveals no friction rub.   No murmur heard. Cap refill < 3 seconds Negative swelling or pitting edema noted to the lower extremities bilaterally   Pulmonary/Chest: Effort normal and breath sounds normal. No respiratory distress. She has no wheezes. She has no rales.  Musculoskeletal: Normal range of motion. She exhibits no edema and no tenderness.  Full ROM to upper and lower extremities without difficulty noted, negative ataxia noted.  Lymphadenopathy:    She has no cervical adenopathy.  Neurological: She is alert and oriented to person, place, and time. No cranial nerve deficit. She exhibits normal muscle tone. Coordination normal. GCS eye subscore is 4. GCS verbal subscore is 5. GCS motor subscore is 6.  Cranial nerves III-XII grossly intact Strength 5+/5+ to upper and lower extremities bilaterally with resistance applied, equal distribution noted Equal grip strength bilaterally  Negative facial drooping Negative slurred speech  Negative aphasia GCS 15 Patient is able to follow commands well  Patient answers questions appropriately   Skin: Skin is warm and dry. No rash noted. She is not diaphoretic. No erythema.  Psychiatric: She has a normal mood and affect. Her behavior is normal. Thought content normal.    ED Course  Procedures (including critical care time)  12:13 PM This provider spoke with Dr. Gwenlyn PerkingMadera, [facility] - discussed case, labs, imaging in great detail. Discussed irregularity in EKG and history. As per physician, reported that patient does not necessarily needs to come into the hospital. Reported that this is more so a outpatient work-up. Reported  that patient does not need to have admission.   12:18 PM This provider spoke with Cardiologist, Nahser. As per Cardiologist reported that patient does not need to be admitted - reported that this can be managed and followed as an outpatient. Reported that he would not perform a stress test on this female. Stated that this is a benign, nonspecific finding in the EKG.   1:01 PM Discussed case and Cardiology recommendations with Dr. Gwenlyn PerkingMadera. Dr. Gwenlyn PerkingMadera agreed to come and assess the patient.   2:04 PM This provider spoke with Dr. Gwenlyn PerkingMadera, hospitalist who saw and assessed patient. As per physician, reported that patient does not need to be admitted to the hospital regarding this issue. Reported that patient has an appointment with a primary care provider on Monday of this coming week. Reported that he's not concerned and feels that the patient is safe for discharge.  Results for orders placed during the hospital encounter of 05/03/14  CBC WITH DIFFERENTIAL      Result Value Ref Range   WBC 7.8  4.0 - 10.5 K/uL   RBC 4.12  3.87 - 5.11 MIL/uL   Hemoglobin 13.1  12.0 - 15.0 g/dL   HCT 40.938.6  81.136.0 - 91.446.0 %   MCV  93.7  78.0 - 100.0 fL   MCH 31.8  26.0 - 34.0 pg   MCHC 33.9  30.0 - 36.0 g/dL   RDW 84.6  96.2 - 95.2 %   Platelets 216  150 - 400 K/uL   Neutrophils Relative % 84 (*) 43 - 77 %   Neutro Abs 6.5  1.7 - 7.7 K/uL   Lymphocytes Relative 9 (*) 12 - 46 %   Lymphs Abs 0.7  0.7 - 4.0 K/uL   Monocytes Relative 6  3 - 12 %   Monocytes Absolute 0.5  0.1 - 1.0 K/uL   Eosinophils Relative 1  0 - 5 %   Eosinophils Absolute 0.1  0.0 - 0.7 K/uL   Basophils Relative 0  0 - 1 %   Basophils Absolute 0.0  0.0 - 0.1 K/uL  COMPREHENSIVE METABOLIC PANEL      Result Value Ref Range   Sodium 140  137 - 147 mEq/L   Potassium 4.2  3.7 - 5.3 mEq/L   Chloride 102  96 - 112 mEq/L   CO2 24  19 - 32 mEq/L   Glucose, Bld 155 (*) 70 - 99 mg/dL   BUN 14  6 - 23 mg/dL   Creatinine, Ser 8.41  0.50 - 1.10 mg/dL    Calcium 9.6  8.4 - 32.4 mg/dL   Total Protein 7.1  6.0 - 8.3 g/dL   Albumin 3.4 (*) 3.5 - 5.2 g/dL   AST 11  0 - 37 U/L   ALT 8  0 - 35 U/L   Alkaline Phosphatase 110  39 - 117 U/L   Total Bilirubin 0.4  0.3 - 1.2 mg/dL   GFR calc non Af Amer 77 (*) >90 mL/min   GFR calc Af Amer 89 (*) >90 mL/min   Anion gap 14  5 - 15  TROPONIN I      Result Value Ref Range   Troponin I <0.30  <0.30 ng/mL  PRO B NATRIURETIC PEPTIDE      Result Value Ref Range   Pro B Natriuretic peptide (BNP) 81.7  0 - 450 pg/mL  TROPONIN I      Result Value Ref Range   Troponin I <0.30  <0.30 ng/mL    Labs Review Labs Reviewed  CBC WITH DIFFERENTIAL - Abnormal; Notable for the following:    Neutrophils Relative % 84 (*)    Lymphocytes Relative 9 (*)    All other components within normal limits  COMPREHENSIVE METABOLIC PANEL - Abnormal; Notable for the following:    Glucose, Bld 155 (*)    Albumin 3.4 (*)    GFR calc non Af Amer 77 (*)    GFR calc Af Amer 89 (*)    All other components within normal limits  TROPONIN I  PRO B NATRIURETIC PEPTIDE  TROPONIN I    Imaging Review Dg Chest 2 View  05/03/2014   CLINICAL DATA:  Near syncope.  Hypertension.  EXAM: CHEST  2 VIEW  COMPARISON:  None.  FINDINGS: Cardiac silhouette areas normal in size. No mediastinal or hilar masses. Lungs are clear. No pleural effusion or pneumothorax.  Bony thorax is demineralized but intact.  IMPRESSION: No acute cardiopulmonary disease.   Electronically Signed   By: Amie Portland M.D.   On: 05/03/2014 11:01     EKG Interpretation   Date/Time:  Saturday May 03 2014 10:10:17 EDT Ventricular Rate:  71 PR Interval:  239 QRS Duration: 99 QT Interval:  399 QTC  Calculation: 434 R Axis:   -35 Text Interpretation:  Sinus rhythm Prolonged PR interval Left axis  deviation Probable anteroseptal infarct, old T wave inversions in lateral  leads Confirmed by WARD,  DO, KRISTEN ([id number]) on 05/03/2014 10:25:06 AM      MDM   Final  diagnoses:  Near syncope  Vaso vagal episode    Medications  sodium chloride 0.9 % bolus 500 mL (0 mLs Intravenous Stopped 05/03/14 1316)    Filed Vitals:   05/03/14 1230 05/03/14 1238 05/03/14 1245 05/03/14 1300  BP: 167/60 167/60  145/62  Pulse: 77 73 78 77  Temp:  98.2 F (36.8 C)    TempSrc:  Oral    Resp:  18    SpO2: 97% 100% 98% 97%   This provider reviewed patient's chart. Patient was seen and assessed in ED setting on 04/20/2014 regarding hypotension and near syncopal episode. Patient was given IV fluids and discharged home. EKG noted sinus rhythm with a heart rate of 71 beats per minutes-prolonged PR interval with left axis deviation. T-wave inversions in the lateral leads. Troponin negative elevation. Seconds troponin negative elevation. CBC negative findings-hemoglobin and hematocrit within normal limits. CMP unremarkable. BNP negative elevation. Chest x-ray unremarkable. Initially wanted to admit the patient, but Cardiology consulted with EKG findings and reported that these are benign nonspecific findings. [facility] saw and assessed patient and does not believe that patient needs to be admitted. Cardiology and Hospitalist reported that patient does not need to be admitted and that patient is safe for discharge.  Suspicion to be vaso-vagal maneuver. Labs re-assuring. Orthostatics unremarkable. Patient reported that she is well and feels okay to be discharged - family okay to discharge home. Patient stable, afebrile. Patient not septic appearing. Vitals stable in the ED setting - negative episodes of hypotension. Discharged patient. Discussed with patient to rest and stay hydrated. Discussed with patient to keep appointment for Monday, 05/05/2014. Discussed with patient to closely monitor symptoms and if symptoms are to worsen or change to report back to the ED - strict return instructions given.  Patient agreed to plan of care, understood, all questions answered.   Raymon Mutton, PA-C 05/03/14 1600

## 2014-05-03 NOTE — ED Notes (Signed)
Pt from home via EMS- Per EMS, pt states that she went to [facility], walked downstairs and then felt very tired like she was going to pass out. Pt did not have LOC. Pt is A&O and in NAD. Pt has no other c/o. Pt has hx of CHF, DM, HTN

## 2014-05-03 NOTE — Progress Notes (Signed)
CARE MANAGEMENT NOTE 05/03/2014  Patient:  Kaitlin Smith,Kaitlin Smith   Account Number:  [id number]  Date Initiated:  05/03/2014  Documentation initiated by:  [facility],Norman Bier  Subjective/Objective Assessment:   syncope     Action/Plan:   Anticipated DC Date:  05/03/2014   Anticipated DC Plan:  HOME/SELF CARE      DC Planning Services  CM consult  PCP issues      Choice offered to / List presented to:             Status of service:  Completed, signed off Medicare Important Message given?  NA - LOS <3 / Initial given by admissions (If response is "NO", the following Medicare IM given date fields will be blank) Date Medicare IM given:   Medicare IM given by:   Date Additional Medicare IM given:   Additional Medicare IM given by:    Discharge Disposition:  HOME/SELF CARE  Per UR Regulation:    If discussed at Long Length of Stay Meetings, dates discussed:    Comments:  05/03/2014 1755 Attempted call to number listed for pt. Left message on voicemail. Isidoro DonningAlesia Stacie Knutzen RN CCM Case Mgmt phone [phone]

## 2014-05-05 ENCOUNTER — Ambulatory Visit: Payer: Medicare Other | Admitting: Internal Medicine

## 2014-05-12 ENCOUNTER — Encounter: Payer: Self-pay | Admitting: Internal Medicine

## 2014-05-12 ENCOUNTER — Ambulatory Visit: Payer: Federal, State, Local not specified - PPO | Attending: Internal Medicine | Admitting: Internal Medicine

## 2014-05-12 VITALS — BP 142/82 | HR 75 | Temp 98.9°F | Resp 16 | Ht 67.0 in | Wt 159.0 lb

## 2014-05-12 DIAGNOSIS — M1710 Unilateral primary osteoarthritis, unspecified knee: Secondary | ICD-10-CM | POA: Insufficient documentation

## 2014-05-12 DIAGNOSIS — I252 Old myocardial infarction: Secondary | ICD-10-CM | POA: Insufficient documentation

## 2014-05-12 DIAGNOSIS — I10 Essential (primary) hypertension: Secondary | ICD-10-CM | POA: Insufficient documentation

## 2014-05-12 DIAGNOSIS — IMO0001 Reserved for inherently not codable concepts without codable children: Secondary | ICD-10-CM

## 2014-05-12 DIAGNOSIS — IMO0002 Reserved for concepts with insufficient information to code with codable children: Secondary | ICD-10-CM

## 2014-05-12 DIAGNOSIS — R739 Hyperglycemia, unspecified: Secondary | ICD-10-CM

## 2014-05-12 DIAGNOSIS — E119 Type 2 diabetes mellitus without complications: Secondary | ICD-10-CM | POA: Insufficient documentation

## 2014-05-12 DIAGNOSIS — Z8673 Personal history of transient ischemic attack (TIA), and cerebral infarction without residual deficits: Secondary | ICD-10-CM | POA: Diagnosis not present

## 2014-05-12 DIAGNOSIS — R7309 Other abnormal glucose: Secondary | ICD-10-CM

## 2014-05-12 DIAGNOSIS — H269 Unspecified cataract: Secondary | ICD-10-CM

## 2014-05-12 DIAGNOSIS — E739 Lactose intolerance, unspecified: Secondary | ICD-10-CM | POA: Diagnosis not present

## 2014-05-12 DIAGNOSIS — M1711 Unilateral primary osteoarthritis, right knee: Secondary | ICD-10-CM

## 2014-05-12 DIAGNOSIS — E1165 Type 2 diabetes mellitus with hyperglycemia: Secondary | ICD-10-CM

## 2014-05-12 LAB — POCT GLYCOSYLATED HEMOGLOBIN (HGB A1C): Hemoglobin A1C: 5.4

## 2014-05-12 LAB — GLUCOSE, POCT (MANUAL RESULT ENTRY): POC GLUCOSE: 141 mg/dL — AB (ref 70–99)

## 2014-05-12 NOTE — Patient Instructions (Signed)
DASH Eating Plan °DASH stands for "Dietary Approaches to Stop Hypertension." The DASH eating plan is a healthy eating plan that has been shown to reduce high blood pressure (hypertension). Additional health benefits may include reducing the risk of type 2 diabetes mellitus, heart disease, and stroke. The DASH eating plan may also help with weight loss. °WHAT DO I NEED TO KNOW ABOUT THE DASH EATING PLAN? °For the DASH eating plan, you will follow these general guidelines: °· Choose foods with a percent daily value for sodium of less than 5% (as listed on the food label). °· Use salt-free seasonings or herbs instead of table salt or sea salt. °· Check with your health care provider or pharmacist before using salt substitutes. °· Eat lower-sodium products, often labeled as "lower sodium" or "no salt added." °· Eat fresh foods. °· Eat more vegetables, fruits, and low-fat dairy products. °· Choose whole grains. Look for the word "whole" as the first word in the ingredient list. °· Choose fish and skinless chicken or turkey more often than red meat. Limit fish, poultry, and meat to 6 oz (170 g) each day. °· Limit sweets, desserts, sugars, and sugary drinks. °· Choose heart-healthy fats. °· Limit cheese to 1 oz (28 g) per day. °· Eat more home-cooked food and less restaurant, buffet, and fast food. °· Limit fried foods. °· Cook foods using methods other than frying. °· Limit canned vegetables. If you do use them, rinse them well to decrease the sodium. °· When eating at a restaurant, ask that your food be prepared with less salt, or no salt if possible. °WHAT FOODS CAN I EAT? °Seek help from a dietitian for individual calorie needs. °Grains °Whole grain or whole wheat bread. Brown rice. Whole grain or whole wheat pasta. Quinoa, bulgur, and whole grain cereals. Low-sodium cereals. Corn or whole wheat flour tortillas. Whole grain cornbread. Whole grain crackers. Low-sodium crackers. °Vegetables °Fresh or frozen vegetables  (raw, steamed, roasted, or grilled). Low-sodium or reduced-sodium tomato and vegetable juices. Low-sodium or reduced-sodium tomato sauce and paste. Low-sodium or reduced-sodium canned vegetables.  °Fruits °All fresh, canned (in natural juice), or frozen fruits. °Meat and Other Protein Products °Ground beef (85% or leaner), grass-fed beef, or beef trimmed of fat. Skinless chicken or turkey. Ground chicken or turkey. Pork trimmed of fat. All fish and seafood. Eggs. Dried beans, peas, or lentils. Unsalted nuts and seeds. Unsalted canned beans. °Dairy °Low-fat dairy products, such as skim or 1% milk, 2% or reduced-fat cheeses, low-fat ricotta or cottage cheese, or plain low-fat yogurt. Low-sodium or reduced-sodium cheeses. °Fats and Oils °Tub margarines without trans fats. Light or reduced-fat mayonnaise and salad dressings (reduced sodium). Avocado. Safflower, olive, or canola oils. Natural peanut or almond butter. °Other °Unsalted popcorn and pretzels. °The items listed above may not be a complete list of recommended foods or beverages. Contact your dietitian for more options. °WHAT FOODS ARE NOT RECOMMENDED? °Grains °White bread. White pasta. White rice. Refined cornbread. Bagels and croissants. Crackers that contain trans fat. °Vegetables °Creamed or fried vegetables. Vegetables in a cheese sauce. Regular canned vegetables. Regular canned tomato sauce and paste. Regular tomato and vegetable juices. °Fruits °Dried fruits. Canned fruit in light or heavy syrup. Fruit juice. °Meat and Other Protein Products °Fatty cuts of meat. Ribs, chicken wings, bacon, sausage, bologna, salami, chitterlings, fatback, hot dogs, bratwurst, and packaged luncheon meats. Salted nuts and seeds. Canned beans with salt. °Dairy °Whole or 2% milk, cream, half-and-half, and cream cheese. Whole-fat or sweetened yogurt. Full-fat   cheeses or blue cheese. Nondairy creamers and whipped toppings. Processed cheese, cheese spreads, or cheese  curds. °Condiments °Onion and garlic salt, seasoned salt, table salt, and sea salt. Canned and packaged gravies. Worcestershire sauce. Tartar sauce. Barbecue sauce. Teriyaki sauce. Soy sauce, including reduced sodium. Steak sauce. Fish sauce. Oyster sauce. Cocktail sauce. Horseradish. Ketchup and mustard. Meat flavorings and tenderizers. Bouillon cubes. Hot sauce. Tabasco sauce. Marinades. Taco seasonings. Relishes. °Fats and Oils °Butter, stick margarine, lard, shortening, ghee, and bacon fat. Coconut, palm kernel, or palm oils. Regular salad dressings. °Other °Pickles and olives. Salted popcorn and pretzels. °The items listed above may not be a complete list of foods and beverages to avoid. Contact your dietitian for more information. °WHERE CAN I FIND MORE INFORMATION? °[employer], [employer]: www.nhlbi.nih.gov/health/health-topics/topics/dash/ °Document Released: 08/25/2011 Document Revised: 01/20/2014 Document Reviewed: 07/10/2013 °[employer]® Patient Information ©2015 [employer]. This information is not intended to replace advice given to you by your health care provider. Make sure you discuss any questions you have with your health care provider. ° °

## 2014-05-12 NOTE — Progress Notes (Signed)
Patient ID: Kaitlin Smith, female   DOB: [birth year], 80 y.o.   MRN: [id number]  NWG:[id number]  [id number]:[id number]  DOB - [birth year]  CC:  Chief Complaint  Patient presents with  . Establish Care       HPI: Kaitlin Smith is a 80 y.o. female here today to establish medical care.  Patient presents today with a past medical history of DM, HTN, CVA, and MI.  Patient just moved here from [place] and is living with her niece.  She reports that she was seen at [facility] and many of her BP medications were changed while inpatient.  Her niece reports that she checks her BP 2-3 times per day and it usually ranges in 120-130 in early day and night time it jumps to 150/87.  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 She currently does have difficulty walking since stroke.  She is legally blind in both eyes for the past year due to bilateral cataracts.  She c/o of arthritis in right knee with history of injections that worked well for her.  Allergies  Allergen Reactions  . Milk-Related Compounds     Lactose intolerant.     Past Medical History  Diagnosis Date  . Diabetes mellitus without complication   . Hypertension   . Stroke   . Myocardial infarction    Current Outpatient Prescriptions on File Prior to Visit  Medication Sig Dispense Refill  . acetaminophen (TYLENOL) 500 MG tablet Take 500 mg by mouth every 6 (six) hours as needed.      Marland Kitchen amLODipine (NORVASC) 10 MG tablet Take 10 mg by mouth daily.      . cloNIDine (CATAPRES) 0.2 MG tablet Take 0.2 mg by mouth every 8 (eight) hours.      . clopidogrel (PLAVIX) 75 MG tablet Take 75 mg by mouth daily.      Marland Kitchen docusate sodium (COLACE) 100 MG capsule Take 300 mg by mouth at bedtime.      . haloperidol (HALDOL) 0.5 MG tablet Take 0.5-1 mg by mouth 3 (three) times daily.      . hydrALAZINE (APRESOLINE) 10 MG tablet Take 10 mg by mouth 3 (three) times daily.      Marland Kitchen labetalol (NORMODYNE) 200 MG tablet Take 400 mg  by mouth 3 (three) times daily.      Marland Kitchen omeprazole (PRILOSEC) 20 MG capsule Take 20 mg by mouth daily.      . simvastatin (ZOCOR) 20 MG tablet Take 20 mg by mouth at bedtime.       No current facility-administered medications on file prior to visit.   Family History  Problem Relation Age of Onset  . Diabetes Mother   . Stroke Mother   . Cancer Brother   . Diabetes Brother   . Alcohol abuse Brother   . Diabetes Sister    History   Social History  . Marital Status: Widowed    Spouse Name: N/A    Number of Children: N/A  . Years of Education: N/A   Occupational History  . Not on file.   Social History Main Topics  . Smoking status: Never Smoker   . Smokeless tobacco: Not on file  . Alcohol Use: No  . Drug Use: No  . Sexual Activity: Not on file   Other Topics Concern  . Not on file   Social History Narrative  . No narrative on file    Review of Systems: Constitutional: Negative  for fever, chills, diaphoresis, activity change, appetite change and fatigue. HENT: Negative for ear pain, nosebleeds, congestion, facial swelling, rhinorrhea, neck pain, neck stiffness and ear discharge.  Eyes: Negative for pain, discharge, redness, itching and visual disturbance. Respiratory: Negative for cough, choking, chest tightness, shortness of breath, wheezing and stridor.  Cardiovascular: Negative for chest pain, palpitations and leg swelling. Gastrointestinal: Negative for abdominal distention. Genitourinary: Negative for dysuria, urgency, frequency, hematuria, flank pain, decreased urine volume, difficulty urinating and dyspareunia.  Musculoskeletal: Negative for back pain, joint swelling, arthralgia. + gait problem.  Neurological: Negative for dizziness, tremors, seizures, syncope, facial asymmetry, speech difficulty, weakness, light-headedness, numbness and headaches.  Hematological: Negative for adenopathy. Does not bruise/bleed easily. Psychiatric/Behavioral: Negative for  hallucinations, behavioral problems, confusion, dysphoric mood, decreased concentration and agitation.    Objective:   Filed Vitals:   05/12/14 1524  BP: 181/88  Pulse: 75  Temp: 98.9 F (37.2 C)  Resp: 16    Physical Exam: Constitutional: Patient appears well-developed and well-nourished. No distress. HENT: Normocephalic, atraumatic, External right and left ear normal. Oropharynx is clear and moist.  Eyes: Conjunctivae and EOM are normal. PERRLA, no scleral icterus. Neck: Normal ROM. Neck supple. No JVD. No tracheal deviation. No thyromegaly. CVS: RRR, S1/S2 +, no murmurs, no gallops, no carotid bruit.  Pulmonary: Effort and breath sounds normal, no stridor, rhonchi, wheezes, rales.  Abdominal: Soft. BS +, no distension, tenderness, rebound or guarding.  Musculoskeletal: Normal range of motion. No edema and no tenderness. Motor strength 5 in all extremities   Lymphadenopathy: No lymphadenopathy noted, cervical Neuro: Alert. Normal reflexes, muscle tone coordination. No cranial nerve deficit. Skin: Skin is warm and dry. No rash noted. Not diaphoretic. No erythema. No pallor. Psychiatric: Normal mood and affect. Behavior, judgment, thought content normal.  Lab Results  Component Value Date   WBC 7.8 05/03/2014   HGB 13.1 05/03/2014   HCT 38.6 05/03/2014   MCV 93.7 05/03/2014   PLT 216 05/03/2014   Lab Results  Component Value Date   CREATININE 0.76 05/03/2014   BUN 14 05/03/2014   NA 140 05/03/2014   K 4.2 05/03/2014   CL 102 05/03/2014   CO2 24 05/03/2014    No results found for this basename: HGBA1C   Lipid Panel  No results found for this basename: chol, trig, hdl, cholhdl, vldl, ldlcalc       Assessment and plan:   Kaitlin Smith was seen today for establish care.  Diagnoses and associated orders for this visit:  Essential hypertension Continue current medications Type II or unspecified type diabetes mellitus without mention of complication, uncontrolled Continue with  diet control, A1c was 5.4 History of CVA (cerebrovascular accident) - Ambulatory referral to Vascular Surgery - Doppler carotid; Future--will repeat first  History of MI (myocardial infarction) - Ambulatory referral to Cardiology per patient. She reports that she wants to continue care with Cardiologist here.  Cataracts, bilateral - Ambulatory referral to Ophthalmology-for cataract evaluation  Osteoarthritis of right knee, unspecified osteoarthritis type - Ambulatory referral to Orthopedic Surgery--per patient would like to have injections again to improve mobility  Hyperglycemia - POCT A1C - Glucose (CBG)   Return in about 3 months (around 08/12/2014).    Holland Commons, NP-C [facility] [phone] 05/12/2014, 3:45 PM

## 2014-05-12 NOTE — Progress Notes (Signed)
Establish Care, need referrals

## 2014-05-13 ENCOUNTER — Ambulatory Visit (HOSPITAL_COMMUNITY)
Admission: RE | Admit: 2014-05-13 | Discharge: 2014-05-13 | Disposition: A | Discharge status: Home / Self Care | Payer: Federal, State, Local not specified - PPO | Source: Ambulatory Visit | Attending: Internal Medicine | Admitting: Internal Medicine

## 2014-05-13 ENCOUNTER — Other Ambulatory Visit (HOSPITAL_COMMUNITY): Payer: Self-pay | Admitting: Internal Medicine

## 2014-05-13 DIAGNOSIS — I6350 Cerebral infarction due to unspecified occlusion or stenosis of unspecified cerebral artery: Secondary | ICD-10-CM | POA: Diagnosis present

## 2014-05-13 DIAGNOSIS — I639 Cerebral infarction, unspecified: Secondary | ICD-10-CM

## 2014-05-13 DIAGNOSIS — E1165 Type 2 diabetes mellitus with hyperglycemia: Secondary | ICD-10-CM

## 2014-05-13 DIAGNOSIS — I6529 Occlusion and stenosis of unspecified carotid artery: Secondary | ICD-10-CM | POA: Insufficient documentation

## 2014-05-13 DIAGNOSIS — I252 Old myocardial infarction: Secondary | ICD-10-CM | POA: Insufficient documentation

## 2014-05-13 DIAGNOSIS — Z8679 Personal history of other diseases of the circulatory system: Secondary | ICD-10-CM

## 2014-05-13 DIAGNOSIS — Z8673 Personal history of transient ischemic attack (TIA), and cerebral infarction without residual deficits: Secondary | ICD-10-CM | POA: Insufficient documentation

## 2014-05-13 DIAGNOSIS — E119 Type 2 diabetes mellitus without complications: Secondary | ICD-10-CM | POA: Insufficient documentation

## 2014-05-13 NOTE — Progress Notes (Signed)
*  PRELIMINARY RESULTS* Vascular Ultrasound Carotid Duplex (Doppler) has been completed.  Preliminary findings: Technically difficult and limited due to patient anatomy and high bifurcation. Right = Based on proximal waveforms, there is evidence of ICA occlusion. Left = There appears to be 80-99% ICA stenosis, based on limited views.   Farrel Demark, RDMS, RVT  05/13/2014, 3:24 PM

## 2014-05-16 ENCOUNTER — Telehealth: Payer: Self-pay | Admitting: *Deleted

## 2014-05-16 ENCOUNTER — Telehealth: Payer: Self-pay | Admitting: Emergency Medicine

## 2014-05-16 NOTE — Telephone Encounter (Signed)
Message copied by Darlis Loan on Fri May 16, 2014 11:29 AM ------      Message from: Holland Commons A      Created: Thu May 15, 2014 11:38 PM       Left Carotid stenosis of 80-99% occlusion, Unable to clearly view right carotin but evidence of some stenosis. Have sent referral to vascular ------

## 2014-05-16 NOTE — Telephone Encounter (Signed)
Left message for pt to call when message received. Vascular referral sent per provider

## 2014-05-16 NOTE — Telephone Encounter (Signed)
Informed Kaitlin Smith that Kaitlin Smith test  showed Left Carotid stenosis of 80-99% occlusion, and they wereuUnable to clearly view right carotid but evidence of some stenosis. Informed her that patient does have a vascular referral in the system.  Kaitlin Smith states the patient also needs a refill on syringes. I did not see this on medication list but she did tell me which one the patient uses.

## 2014-05-18 ENCOUNTER — Emergency Department (HOSPITAL_COMMUNITY): Payer: Federal, State, Local not specified - PPO

## 2014-05-18 ENCOUNTER — Encounter (HOSPITAL_COMMUNITY): Payer: Self-pay | Admitting: Emergency Medicine

## 2014-05-18 ENCOUNTER — Emergency Department (HOSPITAL_COMMUNITY)
Admission: EM | Admit: 2014-05-18 | Discharge: 2014-05-18 | Disposition: A | Discharge status: Home / Self Care | Payer: Federal, State, Local not specified - PPO | Attending: Emergency Medicine | Admitting: Emergency Medicine

## 2014-05-18 DIAGNOSIS — Z79899 Other long term (current) drug therapy: Secondary | ICD-10-CM | POA: Diagnosis not present

## 2014-05-18 DIAGNOSIS — N3000 Acute cystitis without hematuria: Secondary | ICD-10-CM | POA: Diagnosis not present

## 2014-05-18 DIAGNOSIS — Z7902 Long term (current) use of antithrombotics/antiplatelets: Secondary | ICD-10-CM | POA: Diagnosis not present

## 2014-05-18 DIAGNOSIS — I252 Old myocardial infarction: Secondary | ICD-10-CM | POA: Insufficient documentation

## 2014-05-18 DIAGNOSIS — H9190 Unspecified hearing loss, unspecified ear: Secondary | ICD-10-CM | POA: Insufficient documentation

## 2014-05-18 DIAGNOSIS — E119 Type 2 diabetes mellitus without complications: Secondary | ICD-10-CM | POA: Diagnosis not present

## 2014-05-18 DIAGNOSIS — Z8673 Personal history of transient ischemic attack (TIA), and cerebral infarction without residual deficits: Secondary | ICD-10-CM | POA: Insufficient documentation

## 2014-05-18 DIAGNOSIS — K5900 Constipation, unspecified: Secondary | ICD-10-CM | POA: Diagnosis present

## 2014-05-18 DIAGNOSIS — I10 Essential (primary) hypertension: Secondary | ICD-10-CM | POA: Diagnosis not present

## 2014-05-18 LAB — URINALYSIS, ROUTINE W REFLEX MICROSCOPIC
BILIRUBIN URINE: NEGATIVE
Glucose, UA: NEGATIVE mg/dL
Hgb urine dipstick: NEGATIVE
Ketones, ur: NEGATIVE mg/dL
NITRITE: POSITIVE — AB
PROTEIN: NEGATIVE mg/dL
SPECIFIC GRAVITY, URINE: 1.012 (ref 1.005–1.030)
UROBILINOGEN UA: 1 mg/dL (ref 0.0–1.0)
pH: 6 (ref 5.0–8.0)

## 2014-05-18 LAB — URINE MICROSCOPIC-ADD ON

## 2014-05-18 LAB — CBG MONITORING, ED: GLUCOSE-CAPILLARY: 137 mg/dL — AB (ref 70–99)

## 2014-05-18 MED ORDER — POLYETHYLENE GLYCOL 3350 17 G PO PACK: 17.0000 g | PACK | Freq: Every day | ORAL | Status: DC | PRN

## 2014-05-18 MED ORDER — CIPROFLOXACIN HCL 500 MG PO TABS
500.0000 mg | ORAL_TABLET | Freq: Once | ORAL | Status: AC
  Administered 2014-05-18: 500 mg via ORAL
  Filled 2014-05-18: qty 1

## 2014-05-18 MED ORDER — CIPROFLOXACIN HCL 250 MG PO TABS: 250.0000 mg | ORAL_TABLET | Freq: Two times a day (BID) | ORAL | Status: DC

## 2014-05-18 NOTE — ED Provider Notes (Signed)
CSN: [id number]     Arrival date & time 05/18/14  0007 History   First MD Initiated Contact with Patient 05/18/14 0045     Chief Complaint  Patient presents with  . Constipation     (Consider location/radiation/quality/duration/timing/severity/associated sxs/prior Treatment) HPI This is an 80 year old female who recently relocated here from [place]. She normally has 2 bowel movements a day. She is here complaining that she has not had a bowel movement or been able to pass gas for 2 days. Her daughter gave her Dulcolax without relief. She denies frank pain but is having discomfort in her rectal area due to the sensation that she needs to pass stool or gas. She denies fever, chills, nausea, vomiting or dysuria. Her daughter states she has had malodorous urine. The patient's discomfort is mild to moderate.  Past Medical History  Diagnosis Date  . Diabetes mellitus without complication   . Hypertension   . Stroke   . Myocardial infarction    History reviewed. No pertinent past surgical history. Family History  Problem Relation Age of Onset  . Diabetes Mother   . Stroke Mother   . Cancer Brother   . Diabetes Brother   . Alcohol abuse Brother   . Diabetes Sister    History  Substance Use Topics  . Smoking status: Never Smoker   . Smokeless tobacco: Not on file  . Alcohol Use: No   OB History   Grav Para Term Preterm Abortions TAB SAB Ect Mult Living                 Review of Systems  All other systems reviewed and are negative.   Allergies  Milk-related compounds  Home Medications   Prior to Admission medications   Medication Sig Start Date End Date Taking? Authorizing Provider  acetaminophen (TYLENOL) 500 MG tablet Take 500 mg by mouth every 6 (six) hours as needed for moderate pain.    Yes Historical Provider, MD  amLODipine (NORVASC) 10 MG tablet Take 10 mg by mouth daily.   Yes Historical Provider, MD  cloNIDine (CATAPRES) 0.2 MG tablet Take 0.2 mg by mouth every  8 (eight) hours.   Yes Historical Provider, MD  clopidogrel (PLAVIX) 75 MG tablet Take 75 mg by mouth daily.   Yes Historical Provider, MD  docusate sodium (COLACE) 100 MG capsule Take 300 mg by mouth at bedtime.   Yes Historical Provider, MD  haloperidol (HALDOL) 0.5 MG tablet Take 0.5-1 mg by mouth 3 (three) times daily.   Yes Historical Provider, MD  hydrALAZINE (APRESOLINE) 10 MG tablet Take 10 mg by mouth 3 (three) times daily.   Yes Historical Provider, MD  labetalol (NORMODYNE) 200 MG tablet Take 400 mg by mouth 3 (three) times daily.   Yes Historical Provider, MD  omeprazole (PRILOSEC) 20 MG capsule Take 20 mg by mouth daily.   Yes Historical Provider, MD  simvastatin (ZOCOR) 20 MG tablet Take 20 mg by mouth at bedtime.   Yes Historical Provider, MD   BP 131/66  Pulse 70  Temp(Src) 98.9 F (37.2 C) (Oral)  Resp 16  SpO2 99%  Physical Exam General: Well-developed, well-nourished female in no acute distress; appearance consistent with age of record HENT: normocephalic; atraumatic Eyes: pupils equal, round and reactive to light but sluggish; decreased visual acuity; bilateral cataract Neck: supple Heart: regular rate and rhythm Lungs: clear to auscultation bilaterally Abdomen: soft; nondistended; nontender; no masses or hepatosplenomegaly; bowel sounds present Rectal: Normal sphincter tone; no fecal  impaction; stool on examining glove brown without gross blood Extremities: No deformity; pulses normal; trace edema of lower legs Neurologic: Awake, alert; motor function intact in all extremities; no facial droop Skin: Warm and dry Psychiatric: Flat affect    ED Course  Procedures (including critical care time)   MDM   Nursing notes and vitals signs, including pulse oximetry, reviewed.  Summary of this visit's results, reviewed by myself:  Labs:  Results for orders placed during the hospital encounter of 05/18/14 (from the past 24 hour(s))  CBG MONITORING, ED     Status:  Abnormal   Collection Time    05/18/14 12:16 AM      Result Value Ref Range   Glucose-Capillary 137 (*) 70 - 99 mg/dL   Comment 1 Notify RN    URINALYSIS, ROUTINE W REFLEX MICROSCOPIC     Status: Abnormal   Collection Time    05/18/14  3:12 AM      Result Value Ref Range   Color, Urine YELLOW  YELLOW   APPearance CLOUDY (*) CLEAR   Specific Gravity, Urine 1.012  1.005 - 1.030   pH 6.0  5.0 - 8.0   Glucose, UA NEGATIVE  NEGATIVE mg/dL   Hgb urine dipstick NEGATIVE  NEGATIVE   Bilirubin Urine NEGATIVE  NEGATIVE   Ketones, ur NEGATIVE  NEGATIVE mg/dL   Protein, ur NEGATIVE  NEGATIVE mg/dL   Urobilinogen, UA 1.0  0.0 - 1.0 mg/dL   Nitrite POSITIVE (*) NEGATIVE   Leukocytes, UA SMALL (*) NEGATIVE  URINE MICROSCOPIC-ADD ON     Status: Abnormal   Collection Time    05/18/14  3:12 AM      Result Value Ref Range   Squamous Epithelial / LPF FEW (*) RARE   WBC, UA 7-10  <3 WBC/hpf   RBC / HPF 0-2  <3 RBC/hpf   Bacteria, UA MANY (*) RARE    Imaging Studies: Dg Abd Acute W/chest  05/18/2014   CLINICAL DATA:  Constipation for 1 week.  EXAM: ACUTE ABDOMEN SERIES (ABDOMEN 2 VIEW & CHEST 1 VIEW)  COMPARISON:  Chest 05/03/2014  FINDINGS: Normal heart size and pulmonary vascularity. No focal airspace disease or consolidation in the lungs. No blunting of costophrenic angles. No pneumothorax. Mediastinal contours appear intact. Calcified and tortuous aorta.  Scattered gas and stool in the colon. No small or large bowel distention. No free intra-abdominal air. No abnormal air-fluid levels. Vascular calcifications. Calcified phleboliths in the pelvis. Right upper quadrant calcification is probably dystrophic. Gallstone not excluded. Visualized bones appear intact.  IMPRESSION: No evidence of active pulmonary disease. Nonobstructive bowel gas pattern. Calcification in the right upper quadrant is likely dystrophic but gallstone not excluded.   Electronically Signed   By: Burman Nieves M.D.   On:  05/18/2014 01:31   Recommended Miralax to patient's daughter. Will also treat for UTI, as this may be contributing to patient's symptoms of feeling full.     Hanley Seamen, MD 05/18/14 [phone]

## 2014-05-18 NOTE — Discharge Instructions (Signed)

## 2014-05-18 NOTE — ED Notes (Signed)
Let pt know that a urine sample is needed from her, pt states that she does not have to use the restroom right now. Will come back later for it.

## 2014-05-18 NOTE — ED Notes (Signed)
Pt arrived to the ED with a complaint of rectal pain.  Pt states the pain is more of a discomfort that is causing her the inability to sleep.  Pt states she had a stool yesterday.  Pt takes a daily stool softener which is effective.  The pt's daughter states the pt's stool did not have any evidence of blood.

## 2014-05-20 ENCOUNTER — Other Ambulatory Visit: Payer: Self-pay | Admitting: *Deleted

## 2014-05-20 DIAGNOSIS — I6529 Occlusion and stenosis of unspecified carotid artery: Secondary | ICD-10-CM

## 2014-05-20 DIAGNOSIS — Z01810 Encounter for preprocedural cardiovascular examination: Secondary | ICD-10-CM

## 2014-05-27 ENCOUNTER — Telehealth: Payer: Self-pay | Admitting: Internal Medicine

## 2014-05-27 NOTE — Telephone Encounter (Signed)
After hospital visit pt has been taking mirolax for constipation but states she is having regular small bowel movements but still feels pressure, with no pain, in rectal area. Also pt has urge to urinate but takes a while to be able to do so.  Please f/u.

## 2014-05-28 NOTE — Telephone Encounter (Signed)
Returned pt call, left voice message

## 2014-06-03 ENCOUNTER — Telehealth: Payer: Self-pay | Admitting: *Deleted

## 2014-06-03 NOTE — Telephone Encounter (Signed)
Patient's niece left a VM regarding patient having rectal pressure and inability to pass gas. Stated that patient is having BMs without difficulty. Return call made. Left message on VM for return call.

## 2014-06-05 ENCOUNTER — Encounter: Payer: Self-pay | Admitting: Vascular Surgery

## 2014-06-06 ENCOUNTER — Ambulatory Visit (INDEPENDENT_AMBULATORY_CARE_PROVIDER_SITE_OTHER): Payer: Federal, State, Local not specified - PPO | Admitting: Vascular Surgery

## 2014-06-06 ENCOUNTER — Encounter: Payer: Self-pay | Admitting: Vascular Surgery

## 2014-06-06 ENCOUNTER — Ambulatory Visit (HOSPITAL_COMMUNITY)
Admission: RE | Admit: 2014-06-06 | Discharge: 2014-06-06 | Disposition: A | Discharge status: Home / Self Care | Payer: Federal, State, Local not specified - PPO | Source: Ambulatory Visit | Attending: Vascular Surgery | Admitting: Vascular Surgery

## 2014-06-06 VITALS — BP 93/63 | HR 54 | Ht 67.0 in | Wt 159.0 lb

## 2014-06-06 DIAGNOSIS — I739 Peripheral vascular disease, unspecified: Secondary | ICD-10-CM

## 2014-06-06 DIAGNOSIS — I779 Disorder of arteries and arterioles, unspecified: Secondary | ICD-10-CM | POA: Insufficient documentation

## 2014-06-06 DIAGNOSIS — I6529 Occlusion and stenosis of unspecified carotid artery: Secondary | ICD-10-CM

## 2014-06-06 DIAGNOSIS — Z8673 Personal history of transient ischemic attack (TIA), and cerebral infarction without residual deficits: Secondary | ICD-10-CM

## 2014-06-06 DIAGNOSIS — Z01810 Encounter for preprocedural cardiovascular examination: Secondary | ICD-10-CM

## 2014-06-06 NOTE — Progress Notes (Signed)
New Carotid Patient  Referred by:  [name], NP [address] [place], [place] [address]  Reason for referral: L carotid stenosis  History of Present Illness  Kaitlin Smith is a 80 y.o. (Jan 29, 1930) female who presents with chief complaint: "blockage in neck".  Previous carotid studies demonstrated: RICA occlusion , LICA >80% stenosis.  Patient has prior history of CVA in April 2015.  The patient is not certain what her sx was at that time.  She was apparently told she would need a carotid stent at that time.  The patient is legally blind due to B cataracts.  The patient has never had facial drooping or hemiplegia.  The patient has never had receptive or expressive aphasia.   The patient's risks factors for carotid disease include: DM, HTN, and prior smoking.  Additionally, she notes one month before her CVA she had a MI.  Past Medical History  Diagnosis Date  . Diabetes mellitus without complication   . Hypertension   . Stroke   . Myocardial infarction   . Peripheral vascular disease    History   Social History  . Marital Status: Widowed    Spouse Name: N/A    Number of Children: N/A  . Years of Education: N/A   Occupational History  . Not on file.   Social History Main Topics  . Smoking status: Former Smoker    Types: Cigarettes    Quit date: 06/06/2002  . Smokeless tobacco: Not on file  . Alcohol Use: No  . Drug Use: No  . Sexual Activity: Not on file   Other Topics Concern  . Not on file   Social History Narrative  . No narrative on file    Family History  Problem Relation Age of Onset  . Diabetes Mother   . Stroke Mother   . Peripheral vascular disease Mother     amputation  . Cancer Brother   . Diabetes Brother   . Alcohol abuse Brother   . Varicose Veins Brother   . Diabetes Sister   . Hypertension Father   . Varicose Veins Father   . Heart attack Father    Current Outpatient Prescriptions on File Prior to Visit  Medication Sig Dispense  Refill  . acetaminophen (TYLENOL) 500 MG tablet Take 500 mg by mouth every 6 (six) hours as needed for moderate pain.       Marland Kitchen amLODipine (NORVASC) 10 MG tablet Take 10 mg by mouth daily.      . ciprofloxacin (CIPRO) 250 MG tablet Take 1 tablet (250 mg total) by mouth every 12 (twelve) hours.  6 tablet  0  . cloNIDine (CATAPRES) 0.2 MG tablet Take 0.2 mg by mouth every 8 (eight) hours.      . clopidogrel (PLAVIX) 75 MG tablet Take 75 mg by mouth daily.      Marland Kitchen docusate sodium (COLACE) 100 MG capsule Take 300 mg by mouth at bedtime.      . haloperidol (HALDOL) 0.5 MG tablet Take 0.5-1 mg by mouth 3 (three) times daily.      . hydrALAZINE (APRESOLINE) 10 MG tablet Take 10 mg by mouth 3 (three) times daily.      Marland Kitchen labetalol (NORMODYNE) 200 MG tablet Take 400 mg by mouth 3 (three) times daily.      Marland Kitchen omeprazole (PRILOSEC) 20 MG capsule Take 20 mg by mouth daily.      . polyethylene glycol (MIRALAX / GLYCOLAX) packet Take 17 g by  mouth daily as needed (constipation).      . simvastatin (ZOCOR) 20 MG tablet Take 20 mg by mouth at bedtime.       No current facility-administered medications on file prior to visit.    Allergies  Allergen Reactions  . Milk-Related Compounds     Lactose intolerant.      REVIEW OF SYSTEMS:  (Positives checked otherwise negative)  CARDIOVASCULAR:   chest pain,  chest pressure,  palpitations,  shortness of breath when laying flat,  shortness of breath with exertion,   pain in feet when walking,  pain in feet when laying flat,  history of blood clot in veins (DVT),  history of phlebitis,  swelling in legs,  varicose veins  PULMONARY:   productive cough,  asthma,  wheezing  NEUROLOGIC:   weakness in arms or legs,  numbness in arms or legs,  difficulty speaking or slurred speech,  loss of vision in one eye,  dizziness  HEMATOLOGIC:   bleeding problems,  problems with blood clotting too easily  MUSCULOSKEL:   joint pain,   joint swelling  GASTROINTEST:   vomiting blood,  blood in stool     GENITOURINARY:   burning with urination,  blood in urine  PSYCHIATRIC:   history of major depression  INTEGUMENTARY:   rashes,  ulcers  CONSTITUTIONAL:   fever,  chills  For VQI Use Only  PRE-ADM LIVING: Home  AMB STATUS: Wheelchair  CAD Sx: History of MI, but no symptoms MI < 6 months ago  PRIOR CHF: None  STRESS TEST:  No,  Normal,  + ischemia,  + MI,  Both   Physical Examination  Filed Vitals:   06/06/14 1408 06/06/14 1410  BP: 107/61 93/63  Pulse: 54   Height:  (1.702 m)   Weight: 159 lb (72.122 kg)   SpO2: 98%    Body mass index is 24.9 kg/(m^2).  General: A&O x 3, WD, thin  Head: [place]/AT  Ear/Nose/Throat: Hearing grossly intact, nares w/o erythema or drainage, oropharynx w/o Erythema/Exudate, Mallampati score: 3  Eyes: EOMI to instructions, no pupillary reaction, cataracts in bilateral eyes  Neck: Supple, no nuchal rigidity, no palpable LAD  Pulmonary: Sym exp, good air movt, CTAB, no rales, rhonchi, & wheezing  Cardiac: RRR, Nl S1, S2, no Murmurs, rubs or gallops  Vascular: Vessel Right Left  Radial Palpable Palpable  Brachial Palpable Palpable  Carotid Palpable, without bruit Palpable, without bruit  Aorta Not palpable N/A  Femoral Palpable Palpable  Popliteal Not palpable Not palpable  PT Not Palpable Not Palpable  DP Not Palpable Not Palpable   Gastrointestinal: soft, NTND, -G/R, - HSM, - masses, - CVAT B  Musculoskeletal: M/S 5/5 throughout , Extremities without ischemic changes , RLE 1+ edema  Neurologic: CN 2-12 intact except as noted above in EYE, Pain and light touch intact in extremities   Psychiatric: Judgment intact, Mood & affect appropriate for pt's clinical situation  Dermatologic: See M/S exam for extremity exam, no rashes otherwise noted  Lymph : No Cervical, Axillary, or Inguinal lymphadenopathy   Non-Invasive  Vascular Imaging  L CAROTID DUPLEX (Date: 06/06/2014):   L ICA stenosis: 80-99% (492/191 c/s)  L VA: patent and antegrade  Bifurcation is noted to be very high  Outside Studies/Documentation 8 pages of outside documents were reviewed including: outside PCP chart.  Medical Decision Making  Afton Lavalle is a 80 y.o. female who presents with: known R ICA occlusion, asx LICA stenosis >80%.  This is the second opinion that this patient likely will need a carotid stent, so further evaluation of such is indicated.  The carotid angiogram has been deemed an essential component in the diagnostic work-upto determine feasibility of carotid stenting at this facility.  Based on the patient's vascular studies and examination, I have offered the patient: L carotid and cerebral angiogram. I discussed with the patient the nature of angiographic procedures, especially the limited patencies of any endovascular intervention.  The patient is aware of that the risks of an angiographic procedure include but are not limited to: bleeding, infection, access site complications, renal failure, embolization, rupture of vessel, dissection, possible need for emergent surgical intervention, possible need for surgical procedures to treat the patient's pathology, anaphylactic reaction to contrast, and stroke and death.   I discussed also a national reported 1% stroke rate with carotid angiography.  The patient and family are going to discuss whether to proceed with the angiogram vs. Maximal medical mgmt.  I discussed in depth with the patient the nature of atherosclerosis, and emphasized the importance of maximal medical management including strict control of blood pressure, blood glucose, and lipid levels, obtaining regular exercise, antiplatelet agents, and cessation of smoking.   The patient is currently on a statin: Zocor.  The patient is currently on an anti-platelet: Plavix.  The patient is aware that without  maximal medical management the underlying atherosclerotic disease process will progress, limiting the benefit of any interventions.  Thank you for allowing [place] to participate in this patient's care.  Leonides Sake, MD [facility] Office: [phone] Pager: [phone]  06/06/2014, 2:43 PM

## 2014-06-13 ENCOUNTER — Telehealth: Payer: Self-pay | Admitting: Internal Medicine

## 2014-06-13 NOTE — Telephone Encounter (Signed)
Patient's niece call to speak to nurse stating that pt is experiencing irregular bowels. She states that pt is taking stool softeners daily and mirolax as needed. She states that she gave patient a suppository today. Nothing seems to be aiding pt with bowls. Please follow up.

## 2014-06-14 ENCOUNTER — Emergency Department (HOSPITAL_COMMUNITY): Payer: Federal, State, Local not specified - PPO

## 2014-06-14 ENCOUNTER — Encounter (HOSPITAL_COMMUNITY): Payer: Self-pay | Admitting: Emergency Medicine

## 2014-06-14 ENCOUNTER — Emergency Department (HOSPITAL_COMMUNITY)
Admission: EM | Admit: 2014-06-14 | Discharge: 2014-06-14 | Disposition: A | Discharge status: Home / Self Care | Payer: Federal, State, Local not specified - PPO | Attending: Emergency Medicine | Admitting: Emergency Medicine

## 2014-06-14 DIAGNOSIS — K5900 Constipation, unspecified: Secondary | ICD-10-CM | POA: Diagnosis not present

## 2014-06-14 DIAGNOSIS — R404 Transient alteration of awareness: Secondary | ICD-10-CM | POA: Diagnosis present

## 2014-06-14 DIAGNOSIS — R110 Nausea: Secondary | ICD-10-CM | POA: Insufficient documentation

## 2014-06-14 DIAGNOSIS — R55 Syncope and collapse: Secondary | ICD-10-CM | POA: Diagnosis not present

## 2014-06-14 DIAGNOSIS — E119 Type 2 diabetes mellitus without complications: Secondary | ICD-10-CM | POA: Insufficient documentation

## 2014-06-14 DIAGNOSIS — I252 Old myocardial infarction: Secondary | ICD-10-CM | POA: Insufficient documentation

## 2014-06-14 DIAGNOSIS — Z7902 Long term (current) use of antithrombotics/antiplatelets: Secondary | ICD-10-CM | POA: Diagnosis not present

## 2014-06-14 DIAGNOSIS — I10 Essential (primary) hypertension: Secondary | ICD-10-CM | POA: Insufficient documentation

## 2014-06-14 DIAGNOSIS — Z792 Long term (current) use of antibiotics: Secondary | ICD-10-CM | POA: Diagnosis not present

## 2014-06-14 DIAGNOSIS — Z87891 Personal history of nicotine dependence: Secondary | ICD-10-CM | POA: Diagnosis not present

## 2014-06-14 DIAGNOSIS — N390 Urinary tract infection, site not specified: Secondary | ICD-10-CM

## 2014-06-14 DIAGNOSIS — Z8673 Personal history of transient ischemic attack (TIA), and cerebral infarction without residual deficits: Secondary | ICD-10-CM | POA: Insufficient documentation

## 2014-06-14 DIAGNOSIS — Z79899 Other long term (current) drug therapy: Secondary | ICD-10-CM | POA: Diagnosis not present

## 2014-06-14 LAB — COMPREHENSIVE METABOLIC PANEL
ALBUMIN: 3.8 g/dL (ref 3.5–5.2)
ALK PHOS: 137 U/L — AB (ref 39–117)
ALT: 12 U/L (ref 0–35)
AST: 16 U/L (ref 0–37)
Anion gap: 12 (ref 5–15)
BILIRUBIN TOTAL: 0.7 mg/dL (ref 0.3–1.2)
BUN: 10 mg/dL (ref 6–23)
CHLORIDE: 97 meq/L (ref 96–112)
CO2: 28 mEq/L (ref 19–32)
Calcium: 10.4 mg/dL (ref 8.4–10.5)
Creatinine, Ser: 0.68 mg/dL (ref 0.50–1.10)
GFR, EST NON AFRICAN AMERICAN: 79 mL/min — AB (ref >90)
GLUCOSE: 128 mg/dL — AB (ref 70–99)
POTASSIUM: 4.6 meq/L (ref 3.7–5.3)
Sodium: 137 mEq/L (ref 137–147)
Total Protein: 8.2 g/dL (ref 6.0–8.3)

## 2014-06-14 LAB — URINALYSIS, ROUTINE W REFLEX MICROSCOPIC
BILIRUBIN URINE: NEGATIVE
GLUCOSE, UA: NEGATIVE mg/dL
Hgb urine dipstick: NEGATIVE
KETONES UR: NEGATIVE mg/dL
Nitrite: POSITIVE — AB
Protein, ur: NEGATIVE mg/dL
Specific Gravity, Urine: 1.008 (ref 1.005–1.030)
UROBILINOGEN UA: 0.2 mg/dL (ref 0.0–1.0)
pH: 7 (ref 5.0–8.0)

## 2014-06-14 LAB — CBC WITH DIFFERENTIAL/PLATELET
BASOS PCT: 0 % (ref 0–1)
Basophils Absolute: 0 10*3/uL (ref 0.0–0.1)
Eosinophils Absolute: 0.1 10*3/uL (ref 0.0–0.7)
Eosinophils Relative: 1 % (ref 0–5)
HEMATOCRIT: 43.4 % (ref 36.0–46.0)
Hemoglobin: 14.9 g/dL (ref 12.0–15.0)
LYMPHS ABS: 1 10*3/uL (ref 0.7–4.0)
Lymphocytes Relative: 12 % (ref 12–46)
MCH: 31.6 pg (ref 26.0–34.0)
MCHC: 34.3 g/dL (ref 30.0–36.0)
MCV: 92.1 fL (ref 78.0–100.0)
MONO ABS: 0.8 10*3/uL (ref 0.1–1.0)
MONOS PCT: 10 % (ref 3–12)
NEUTROS ABS: 6.5 10*3/uL (ref 1.7–7.7)
NEUTROS PCT: 77 % (ref 43–77)
Platelets: 253 10*3/uL (ref 150–400)
RBC: 4.71 MIL/uL (ref 3.87–5.11)
RDW: 12.5 % (ref 11.5–15.5)
WBC: 8.4 10*3/uL (ref 4.0–10.5)

## 2014-06-14 LAB — URINE MICROSCOPIC-ADD ON

## 2014-06-14 LAB — LIPASE, BLOOD: LIPASE: 25 U/L (ref 11–59)

## 2014-06-14 LAB — I-STAT TROPONIN, ED: TROPONIN I, POC: 0.01 ng/mL (ref 0.00–0.08)

## 2014-06-14 LAB — CBG MONITORING, ED: GLUCOSE-CAPILLARY: 166 mg/dL — AB (ref 70–99)

## 2014-06-14 LAB — I-STAT CG4 LACTIC ACID, ED: LACTIC ACID, VENOUS: 1.22 mmol/L (ref 0.5–2.2)

## 2014-06-14 MED ORDER — IOHEXOL 300 MG/ML  SOLN
50.0000 mL | Freq: Once | INTRAMUSCULAR | Status: AC | PRN
  Administered 2014-06-14: 50 mL via ORAL

## 2014-06-14 MED ORDER — IOHEXOL 300 MG/ML  SOLN
100.0000 mL | Freq: Once | INTRAMUSCULAR | Status: AC | PRN
  Administered 2014-06-14: 100 mL via INTRAVENOUS

## 2014-06-14 MED ORDER — CEPHALEXIN 500 MG PO TABS: 500.0000 mg | ORAL_TABLET | Freq: Four times a day (QID) | ORAL | Status: DC

## 2014-06-14 MED ORDER — ONDANSETRON HCL 4 MG/2ML IJ SOLN
4.0000 mg | Freq: Once | INTRAMUSCULAR | Status: AC
  Administered 2014-06-14: 4 mg via INTRAVENOUS
  Filled 2014-06-14: qty 2

## 2014-06-14 MED ORDER — MILK AND MOLASSES ENEMA
1.0000 | Freq: Once | RECTAL | Status: AC
  Administered 2014-06-14: 250 mL via RECTAL
  Filled 2014-06-14: qty 250

## 2014-06-14 MED ORDER — SODIUM CHLORIDE 0.9 % IV BOLUS (SEPSIS)
1000.0000 mL | Freq: Once | INTRAVENOUS | Status: AC
  Administered 2014-06-14: 1000 mL via INTRAVENOUS

## 2014-06-14 NOTE — ED Notes (Signed)
IV start with lab draw attempted x 2 without success.  Will notify charge RN

## 2014-06-14 NOTE — ED Notes (Signed)
Bed: ZO10 Expected date: 06/14/14 Expected time: 9:43 AM Means of arrival: Ambulance Comments: Syncopal episode

## 2014-06-14 NOTE — ED Notes (Signed)
Pt lives at home with her niece (caregiver).  Per report, she was getting ready for a bath, c/o abdominal pain and had syncopal episode and was incontinent of stool.  Was out approx 3 mins but was AxO upon ems arrival.  Recently finished cipro for UTI.  Hx DM with EMS CBG of 145.  Pt is legally blind and needs assistance to walk, but did ambulate to the EMS stretcher.

## 2014-06-14 NOTE — Discharge Instructions (Signed)
Syncope Syncope means a person passes out (faints). The person usually wakes up in less than 5 minutes. It is important to seek medical care for syncope. HOME CARE  Have someone stay with you until you feel normal.  Do not drive, use machines, or play sports until your doctor says it is okay.  Keep all doctor visits as told.  Lie down when you feel like you might pass out. Take deep breaths. Wait until you feel normal before standing up.  Drink enough fluids to keep your pee (urine) clear or pale yellow.  If you take blood pressure or heart medicine, get up slowly. Take several minutes to sit and then stand. GET HELP RIGHT AWAY IF:   You have a severe headache.  You have pain in the chest, belly (abdomen), or back.  You are bleeding from the mouth or butt (rectum).  You have black or tarry poop (stool).  You have an irregular or very fast heartbeat.  You have pain with breathing.  You keep passing out, or you have shaking (seizures) when you pass out.  You pass out when sitting or lying down.  You feel confused.  You have trouble walking.  You have severe weakness.  You have vision problems. If you fainted, call your local emergency services (911 in [place] Do not drive yourself to the hospital. MAKE SURE YOU:   Understand these instructions.  Will watch your condition.  Will get help right away if you are not doing well or get worse. Document Released: 02/22/2008 Document Revised: 03/06/2012 Document Reviewed: 11/04/2011 [facility] Patient Information 2015 [place], [place]. This information is not intended to replace advice given to you by your health care provider. Make sure you discuss any questions you have with your health care provider.  Urinary Tract Infection A urinary tract infection (UTI) can occur any place along the urinary tract. The tract includes the kidneys, ureters, bladder, and urethra. A type of germ called bacteria often causes a UTI. UTIs are often  helped with antibiotic medicine.  HOME CARE   If given, take antibiotics as told by your doctor. Finish them even if you start to feel better.  Drink enough fluids to keep your pee (urine) clear or pale yellow.  Avoid tea, drinks with caffeine, and bubbly (carbonated) drinks.  Pee often. Avoid holding your pee in for a long time.  Pee before and after having sex (intercourse).  Wipe from front to back after you poop (bowel movement) if you are a woman. Use each tissue only once. GET HELP RIGHT AWAY IF:   You have back pain.  You have lower belly (abdominal) pain.  You have chills.  You feel sick to your stomach (nauseous).  You throw up (vomit).  Your burning or discomfort with peeing does not go away.  You have a fever.  Your symptoms are not better in 3 days. MAKE SURE YOU:   Understand these instructions.  Will watch your condition.  Will get help right away if you are not doing well or get worse. Document Released: 02/22/2008 Document Revised: 05/30/2012 Document Reviewed: 04/05/2012 [facility] Patient Information 2015 [place], [place]. This information is not intended to replace advice given to you by your health care provider. Make sure you discuss any questions you have with your health care provider.  Constipation Constipation is when a person:  Poops (has a bowel movement) less than 3 times a week.  Has a hard time pooping.  Has poop that is dry, hard,  or bigger than normal. HOME CARE   Eat foods with a lot of fiber in them. This includes fruits, vegetables, beans, and whole grains such as brown rice.  Avoid fatty foods and foods with a lot of sugar. This includes french fries, hamburgers, cookies, candy, and soda.  If you are not getting enough fiber from food, take products with added fiber in them (supplements).  Drink enough fluid to keep your pee (urine) clear or pale yellow.  Exercise on a regular basis, or as told by your doctor.  Go to the  restroom when you feel like you need to poop. Do not hold it.  Only take medicine as told by your doctor. Do not take medicines that help you poop (laxatives) without talking to your doctor first. GET HELP RIGHT AWAY IF:   You have bright red blood in your poop (stool).  Your constipation lasts more than 4 days or gets worse.  You have belly (abdominal) or butt (rectal) pain.  You have thin poop (as thin as a pencil).  You lose weight, and it cannot be explained. MAKE SURE YOU:   Understand these instructions.  Will watch your condition.  Will get help right away if you are not doing well or get worse. Document Released: 02/22/2008 Document Revised: 09/10/2013 Document Reviewed: 06/17/2013 [facility] Patient Information 2015 [place], [place]. This information is not intended to replace advice given to you by your health care provider. Make sure you discuss any questions you have with your health care provider.

## 2014-06-14 NOTE — ED Notes (Signed)
Patient transported to CT 

## 2014-06-14 NOTE — ED Provider Notes (Signed)
CSN: [id number]     Arrival date & time 06/14/14  4098 History   First MD Initiated Contact with Patient 06/14/14 [phone]     Chief Complaint  Patient presents with  . Loss of Consciousness     (Consider location/radiation/quality/duration/timing/severity/associated sxs/prior Treatment) HPI Comments: Patient is an 80 year old female with history of diabetes, hypertension, stroke, MI, PVD, and syncope who presents to the ED today after a syncopal episode. She reports that she was feeling well, other than tossing and turning all night when she went to get in the shower. She was sitting down and her niece came in to help her with her bath. She became diaphoretic, nauseous, and syncopized. She denies abdominal pain, although her niece reported she complained of this prior to losing consciousness. She lost consciousness for approximately 3 minutes. She was incontinent of stool. She explicitly denies chest pain, shortness of breath. This is the 3rd time this has happened. Each time associated with low BP.   Patient is a 80 y.o. female presenting with syncope. The history is provided by the patient. No language interpreter was used.  Loss of Consciousness Associated symptoms: nausea   Associated symptoms: no chest pain, no fever, no shortness of breath and no vomiting     Past Medical History  Diagnosis Date  . Diabetes mellitus without complication   . Hypertension   . Stroke   . Myocardial infarction   . Peripheral vascular disease    History reviewed. No pertinent past surgical history. Family History  Problem Relation Age of Onset  . Diabetes Mother   . Stroke Mother   . Peripheral vascular disease Mother     amputation  . Cancer Brother   . Diabetes Brother   . Alcohol abuse Brother   . Varicose Veins Brother   . Diabetes Sister   . Hypertension Father   . Varicose Veins Father   . Heart attack Father    History  Substance Use Topics  . Smoking status: Former Smoker    Types:  Cigarettes    Quit date: 06/06/2002  . Smokeless tobacco: Not on file  . Alcohol Use: No   OB History   Grav Para Term Preterm Abortions TAB SAB Ect Mult Living                 Review of Systems  Constitutional: Negative for fever and chills.  Respiratory: Negative for shortness of breath.   Cardiovascular: Positive for syncope. Negative for chest pain.  Gastrointestinal: Positive for nausea. Negative for vomiting and abdominal pain.  Neurological: Positive for syncope.  All other systems reviewed and are negative.     Allergies  Milk-related compounds  Home Medications   Prior to Admission medications   Medication Sig Start Date End Date Taking? Authorizing Provider  acetaminophen (TYLENOL) 500 MG tablet Take 500 mg by mouth every 6 (six) hours as needed for moderate pain.     Historical Provider, MD  ALPRAZolam Prudy Feeler) 0.5 MG tablet  04/14/14   Historical Provider, MD  amLODipine (NORVASC) 10 MG tablet Take 10 mg by mouth daily.    Historical Provider, MD  ciprofloxacin (CIPRO) 250 MG tablet Take 1 tablet (250 mg total) by mouth every 12 (twelve) hours. 05/18/14   Carlisle Beers Molpus, MD  cloNIDine (CATAPRES) 0.2 MG tablet Take 0.2 mg by mouth every 8 (eight) hours.    Historical Provider, MD  clopidogrel (PLAVIX) 75 MG tablet Take 75 mg by mouth daily.  Historical Provider, MD  docusate sodium (COLACE) 100 MG capsule Take 300 mg by mouth at bedtime.    Historical Provider, MD  haloperidol (HALDOL) 0.5 MG tablet Take 0.5-1 mg by mouth 3 (three) times daily.    Historical Provider, MD  hydrALAZINE (APRESOLINE) 10 MG tablet Take 10 mg by mouth 3 (three) times daily.    Historical Provider, MD  labetalol (NORMODYNE) 200 MG tablet Take 400 mg by mouth 3 (three) times daily.    Historical Provider, MD  labetalol (NORMODYNE) 300 MG tablet  04/02/14   Historical Provider, MD  LANTUS 100 UNIT/ML injection  04/03/14   Historical Provider, MD  nitrofurantoin, macrocrystal-monohydrate,  (MACROBID) 100 MG capsule  04/16/14   Historical Provider, MD  omeprazole (PRILOSEC) 20 MG capsule Take 20 mg by mouth daily.    Historical Provider, MD  phenytoin (DILANTIN) 100 MG ER capsule  04/16/14   Historical Provider, MD  polyethylene glycol (MIRALAX / GLYCOLAX) packet Take 17 g by mouth daily as needed (constipation). 05/18/14   Carlisle Beers Molpus, MD  simvastatin (ZOCOR) 20 MG tablet Take 20 mg by mouth at bedtime.    Historical Provider, MD  simvastatin (ZOCOR) 40 MG tablet  04/02/14   Historical Provider, MD   BP 142/62  Pulse 69  Temp(Src) 98.2 F (36.8 C) (Oral)  Resp 15  SpO2 97% Physical Exam  Nursing note and vitals reviewed. Constitutional: She is oriented to person, place, and time. She appears well-developed and well-nourished. No distress.  HENT:  Head: Normocephalic and atraumatic.  Right Ear: External ear normal.  Left Ear: External ear normal.  Nose: Nose normal.  Mouth/Throat: Uvula is midline and oropharynx is clear and moist.  Eyes: Conjunctivae, EOM and lids are normal.  Patient is blind  Neck: Normal range of motion. No spinous process tenderness and no muscular tenderness present.  Cardiovascular: Normal rate, regular rhythm, normal heart sounds, intact distal pulses and normal pulses.   Pulses:      Radial pulses are 2+ on the right side, and 2+ on the left side.       Posterior tibial pulses are 2+ on the right side, and 2+ on the left side.  Pulmonary/Chest: Effort normal and breath sounds normal. No stridor. No respiratory distress. She has no wheezes. She has no rales.  Abdominal: Soft. She exhibits no distension. There is no tenderness.  Genitourinary:  Soft stool felt at distal tip of my finger. Attempted to remove stool ball, but too proximal in colon.   Musculoskeletal: Normal range of motion.  Neurological: She is alert and oriented to person, place, and time. She has normal strength.  Skin: Skin is warm and dry. She is not diaphoretic. No erythema.   Psychiatric: She has a normal mood and affect. Her behavior is normal.    ED Course  Procedures (including critical care time) Labs Review Labs Reviewed  COMPREHENSIVE METABOLIC PANEL - Abnormal; Notable for the following:    Glucose, Bld 128 (*)    Alkaline Phosphatase 137 (*)    GFR calc non Af Amer 79 (*)    All other components within normal limits  URINALYSIS, ROUTINE W REFLEX MICROSCOPIC - Abnormal; Notable for the following:    APPearance CLOUDY (*)    Nitrite POSITIVE (*)    Leukocytes, UA SMALL (*)    All other components within normal limits  URINE MICROSCOPIC-ADD ON - Abnormal; Notable for the following:    Bacteria, UA MANY (*)    All other  components within normal limits  CBG MONITORING, ED - Abnormal; Notable for the following:    Glucose-Capillary 166 (*)    All other components within normal limits  URINE CULTURE  CBC WITH DIFFERENTIAL  LIPASE, BLOOD  POCT CBG (FASTING - GLUCOSE)-MANUAL ENTRY  I-STAT TROPOININ, ED  I-STAT CG4 LACTIC ACID, ED    Imaging Review Dg Chest 2 View  06/14/2014   CLINICAL DATA:  Loss of consciousness.  EXAM: CHEST  2 VIEW  COMPARISON:  05/18/2014  FINDINGS: Heart and mediastinum are within normal limits. Lungs are clear bilaterally. Negative for a pneumothorax. No acute bone abnormality. Atherosclerotic calcifications in the abdominal aorta.  IMPRESSION: No active cardiopulmonary disease.   Electronically Signed   By: Richarda Overlie M.D.   On: 06/14/2014 10:58   Ct Abdomen Pelvis W Contrast  06/14/2014   CLINICAL DATA:  Single episode, abdominal pain, stool incontinence  EXAM: CT ABDOMEN AND PELVIS WITH CONTRAST  TECHNIQUE: Multidetector CT imaging of the abdomen and pelvis was performed using the standard protocol following bolus administration of intravenous contrast.  CONTRAST:  OMNIPAQUE IOHEXOL 300 MG/ML  SOLN  COMPARISON:  05/18/2014  FINDINGS: Clear lung bases. Normal heart size for age. Small hiatal hernia noted. No pericardial  or pleural effusion. Descending thoracic aorta is tortuous and atherosclerotic.  Abdomen: Small scattered indeterminate hypodense areas in the the left hepatic lobe, suspect small cysts largest measures 12 mm, image 13. No biliary dilatation. Gallbladder demonstrates hyperdense layering material suspect small gallstones. No biliary dilatation.  Pancreas, spleen, and adrenal glands within normal limits for age and noncontrast imaging.  Kidneys demonstrate normal enhancement and excretion without obstruction or hydronephrosis. Small scattered incidental sub cm renal cortical cysts in the right kidney.  Calcific atherosclerosis of the aorta without significant aneurysm. No retroperitoneal abnormality.  Negative for bowel obstruction, significant dilatation, ileus, or free air.  No abdominal free fluid, fluid collection, hemorrhage, abscess, or adenopathy.  Normal appendix demonstrated in the right lower quadrant.  Scattered colonic diverticulosis without acute inflammatory process.  Pelvis: No pelvic free fluid, fluid collection, hemorrhage, abscess, adenopathy, inguinal abnormality, or hernia. Small calcified degenerated uterine fibroids noted. No definite adnexal abnormality. Urinary bladder unremarkable. Rectum is moderately stool filled, suspect fecal impaction.  Extensive iliac atherosclerosis.  In the presacral space, they are well circumscribed rounded mixed density lesions with soft tissue and fat density roughly measuring 5.6 x 3.3 cm, image 63. Smaller adjacent similar presacral nodules are noted measuring 2.5 cm, image 69. No surrounding adenopathy or osseous abnormality. Lesions have a nonaggressive appearance and presacral myelolipomas are favored.  IMPRESSION: No acute intra-abdominal or pelvic finding.  Colonic diverticulosis without acute process.  Suspect cholelithiasis  Normal appendix  Stool-filled rectum, suspect fecal impaction  Mixed soft tissue and fat density presacral masses, favor adrenal  myelolipomas.   Electronically Signed   By: Ruel Favors M.D.   On: 06/14/2014 14:33     EKG Interpretation   Date/Time:  Saturday June 14 2014 09:57:59 EDT Ventricular Rate:  67 PR Interval:  215 QRS Duration: 92 QT Interval:  370 QTC Calculation: 390 R Axis:   -55 Text Interpretation:  Sinus rhythm Borderline prolonged PR interval  Inferior infarct, old Anterior infarct, old Lateral leads are also  involved No significant change since last tracing Confirmed by POLLINA   MD, CHRISTOPHER [phone]) on 06/14/2014 11:25:13 AM      MDM   Final diagnoses:  UTI (lower urinary tract infection)  Constipation, unspecified constipation type  Syncope, unspecified syncope type    Patient presents to ED after syncopal episode. Hx of the same. Likely vasovagal. Workup shows UTI and fecal impaction. Will culture urine and given abx. Unable to successfully disimpact patient as stool was slightly too proximal for my finger to reach. Will give milk and molasses enema. Patient encouraged to continue Miralax at home. Patient signed out to [place], PA-C at change of shift. Waiting on patient to get enema at this time.     Mora Bellman, PA-C 06/14/14 [phone]

## 2014-06-14 NOTE — ED Notes (Addendum)
I attempted x1 to obtain blood, was unsuccessful x1. RN Debbie notified. Main lab has been called

## 2014-06-14 NOTE — ED Notes (Signed)
Pt still cannot void, but stated "when I feel like I need to go I will let someone know"

## 2014-06-14 NOTE — ED Provider Notes (Signed)
4:00 Pm Patient signed out by Junious Silk, PA-C at shift change.  Patient presenting today after a syncopal event.  Patient also was complaining of abdominal pain.  CT ab/pelvis showed fecal impaction.  Junious Silk, PA-C attempted to disimpact the patient, but was not able to.  Enema ordered.  Plan is for the patient to be discharged once she has a BM.    5:31 PM Patient had a large bowel movement.  Abdominal pain improved.  Patient stable for discharge.  Return precautions given.  Santiago Glad, PA-C 06/14/14 2048

## 2014-06-14 NOTE — ED Provider Notes (Addendum)
Patient presented to the ER with syncope. This is the third time patient has presented with syncope. The patient reports that each time she was found to have a temporary low blood pressures causing the passing out. Patient also reports that she has been having a lot of problems with her bowels. She has issue of constipation, takes laxatives. Patient did have abdominal pain immediately preceding her episode today.  Face to face Exam: HEENT - PERRLA Lungs - CTAB Heart - RRR, no M/R/G Abd - S/NT/ND Neuro - alert, oriented x3  Plan: Patient with recurrent syncope, likely vasovagal in nature. Records reviewed, she has been diagnosed with vasovagal syncope with each of her previous presentations. Patient complaining of abdominal discomfort. Will workup including CT scan.  Angiocath insertion Performed by: Gilda Crease  Consent: Verbal consent obtained. Risks and benefits: risks, benefits and alternatives were discussed Time out: Immediately prior to procedure a "time out" was called to verify the correct patient, procedure, equipment, support staff and site/side marked as required.  Preparation: Patient was prepped and draped in the usual sterile fashion.  Vein Location: right antecub  Ultrasound Guided  Gauge: 20G  Normal blood return and flush without difficulty Patient tolerance: Patient tolerated the procedure well with no immediate complications.    Gilda Crease, MD 06/14/14 1123  Gilda Crease, MD 06/14/14 1218

## 2014-06-15 NOTE — ED Provider Notes (Signed)
Medical screening examination/treatment/procedure(s) were conducted as a shared visit with non-physician practitioner(s) and myself.  I personally evaluated the patient during the encounter.  Please see separate associated note for evaluation and plan.    EKG Interpretation   Date/Time:  Saturday June 14 2014 09:57:59 EDT Ventricular Rate:  67 PR Interval:  215 QRS Duration: 92 QT Interval:  370 QTC Calculation: 390 R Axis:   -55 Text Interpretation:  Sinus rhythm Borderline prolonged PR interval  Inferior infarct, old Anterior infarct, old Lateral leads are also  involved No significant change since last tracing Confirmed by [facility]   MD, Keiarah Orlowski [phone]) on 06/14/2014 11:25:13 AM       Gilda Crease, MD 06/15/14 [phone]

## 2014-06-17 LAB — URINE CULTURE: Colony Count: >=100000

## 2014-06-18 ENCOUNTER — Telehealth (HOSPITAL_COMMUNITY): Payer: Self-pay | Admitting: *Deleted

## 2014-06-18 NOTE — ED Notes (Signed)
(+)  Urine culture, treated wti Cephalexin, [facility]

## 2014-06-19 NOTE — ED Provider Notes (Signed)
Medical screening examination/treatment/procedure(s) were performed by non-physician practitioner and as supervising physician I was immediately available for consultation/collaboration.   EKG Interpretation   Date/Time:  Saturday June 14 2014 09:57:59 EDT Ventricular Rate:  67 PR Interval:  215 QRS Duration: 92 QT Interval:  370 QTC Calculation: 390 R Axis:   -55 Text Interpretation:  Sinus rhythm Borderline prolonged PR interval  Inferior infarct, old Anterior infarct, old Lateral leads are also  involved ED PHYSICIAN INTERPRETATION AVAILABLE IN [facility]  Confirmed by TEST, Record ([id number]) on 06/16/2014 7:17:41 AM       Raeford RazorStephen Emelly Wurtz, MD 06/19/14 1347

## 2014-06-23 ENCOUNTER — Ambulatory Visit: Payer: Federal, State, Local not specified - PPO | Admitting: Internal Medicine

## 2014-06-23 ENCOUNTER — Ambulatory Visit: Payer: Federal, State, Local not specified - PPO | Admitting: Podiatry

## 2014-07-01 ENCOUNTER — Telehealth: Payer: Self-pay | Admitting: *Deleted

## 2014-07-01 NOTE — Telephone Encounter (Signed)
Niece called re: getting refills on Mrs. Kaitlin Smith' Amlodipine and Haloperidol prescriptions. I called her back and left a message for her to contact her PCP for these refills. Epic has Kaitlin FairlyValererie Keck, NP as her PCP.

## 2014-07-09 ENCOUNTER — Encounter: Payer: Self-pay | Admitting: Podiatry

## 2014-07-09 ENCOUNTER — Ambulatory Visit (INDEPENDENT_AMBULATORY_CARE_PROVIDER_SITE_OTHER): Payer: Federal, State, Local not specified - PPO | Admitting: Podiatry

## 2014-07-09 VITALS — BP 154/96 | HR 86 | Resp 12

## 2014-07-09 DIAGNOSIS — R0989 Other specified symptoms and signs involving the circulatory and respiratory systems: Secondary | ICD-10-CM

## 2014-07-09 DIAGNOSIS — M79676 Pain in unspecified toe(s): Secondary | ICD-10-CM

## 2014-07-09 DIAGNOSIS — B351 Tinea unguium: Secondary | ICD-10-CM

## 2014-07-09 NOTE — Progress Notes (Signed)
   Subjective:    Patient ID: Kaitlin Smith, female    DOB: 1930/06/26, 80 y.o.   MRN: [id number]  HPI  N-SORE, THICK, DISCOLORATION L-B/L TOENAILS D-LONG TERM O-SLOWLY C-WORSE A-PRESSURE T-NONE  Review of Systems  HENT: Positive for sinus pressure.   Eyes: Positive for itching and visual disturbance.  Gastrointestinal: Positive for constipation.  Musculoskeletal: Positive for gait problem.  Psychiatric/Behavioral: The patient is nervous/anxious.   All other systems reviewed and are negative.  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 Notify patient on receipt of vascular lab  Reappoint at three-month intervals for nail debridement

## 2014-07-09 NOTE — Patient Instructions (Signed)
The vascular lab we'll contact you to schedule a lower extremity arterial Doppler and our office will notify you of the results  Diabetes and Foot Care Diabetes may cause you to have problems because of poor blood supply (circulation) to your feet and legs. This may cause the skin on your feet to become thinner, break easier, and heal more slowly. Your skin may become dry, and the skin may peel and crack. You may also have nerve damage in your legs and feet causing decreased feeling in them. You may not notice minor injuries to your feet that could lead to infections or more serious problems. Taking care of your feet is one of the most important things you can do for yourself.  HOME CARE INSTRUCTIONS  Wear shoes at all times, even in the house. Do not go barefoot. Bare feet are easily injured.  Check your feet daily for blisters, cuts, and redness. If you cannot see the bottom of your feet, use a mirror or ask someone for help.  Wash your feet with warm water (do not use hot water) and mild soap. Then pat your feet and the areas between your toes until they are completely dry. Do not soak your feet as this can dry your skin.  Apply a moisturizing lotion or petroleum jelly (that does not contain alcohol and is unscented) to the skin on your feet and to dry, brittle toenails. Do not apply lotion between your toes.  Trim your toenails straight across. Do not dig under them or around the cuticle. File the edges of your nails with an emery board or nail file.  Do not cut corns or calluses or try to remove them with medicine.  Wear clean socks or stockings every day. Make sure they are not too tight. Do not wear knee-high stockings since they may decrease blood flow to your legs.  Wear shoes that fit properly and have enough cushioning. To break in new shoes, wear them for just a few hours a day. This prevents you from injuring your feet. Always look in your shoes before you put them on to be sure  there are no objects inside.  Do not cross your legs. This may decrease the blood flow to your feet.  If you find a minor scrape, cut, or break in the skin on your feet, keep it and the skin around it clean and dry. These areas may be cleansed with mild soap and water. Do not cleanse the area with peroxide, alcohol, or iodine.  When you remove an adhesive bandage, be sure not to damage the skin around it.  If you have a wound, look at it several times a day to make sure it is healing.  Do not use heating pads or hot water bottles. They may burn your skin. If you have lost feeling in your feet or legs, you may not know it is happening until it is too late.  Make sure your health care provider performs a complete foot exam at least annually or more often if you have foot problems. Report any cuts, sores, or bruises to your health care provider immediately. SEEK MEDICAL CARE IF:   You have an injury that is not healing.  You have cuts or breaks in the skin.  You have an ingrown nail.  You notice redness on your legs or feet.  You feel burning or tingling in your legs or feet.  You have pain or cramps in your legs and feet.  Your legs or feet are numb.  Your feet always feel cold. SEEK IMMEDIATE MEDICAL CARE IF:   There is increasing redness, swelling, or pain in or around a wound.  There is a red line that goes up your leg.  Pus is coming from a wound.  You develop a fever or as directed by your health care provider.  You notice a bad smell coming from an ulcer or wound. Document Released: 09/02/2000 Document Revised: 05/08/2013 Document Reviewed: 02/12/2013 [facility] Patient Information 2015 [place], [place]. This information is not intended to replace advice given to you by your health care provider. Make sure you discuss any questions you have with your health care provider.

## 2014-07-10 ENCOUNTER — Telehealth (HOSPITAL_COMMUNITY): Payer: Self-pay | Admitting: *Deleted

## 2014-07-11 ENCOUNTER — Telehealth (HOSPITAL_COMMUNITY): Payer: Self-pay | Admitting: *Deleted

## 2014-07-14 ENCOUNTER — Ambulatory Visit: Payer: Federal, State, Local not specified - PPO | Admitting: Internal Medicine

## 2014-07-17 ENCOUNTER — Ambulatory Visit: Payer: Federal, State, Local not specified - PPO | Admitting: Internal Medicine

## 2014-07-18 ENCOUNTER — Encounter: Payer: Self-pay | Admitting: Internal Medicine

## 2014-07-18 ENCOUNTER — Ambulatory Visit: Payer: Federal, State, Local not specified - PPO | Attending: Internal Medicine | Admitting: Internal Medicine

## 2014-07-18 VITALS — BP 120/71 | HR 70 | Temp 98.4°F | Resp 14

## 2014-07-18 DIAGNOSIS — N3941 Urge incontinence: Secondary | ICD-10-CM | POA: Diagnosis not present

## 2014-07-18 DIAGNOSIS — Y939 Activity, unspecified: Secondary | ICD-10-CM | POA: Insufficient documentation

## 2014-07-18 DIAGNOSIS — I252 Old myocardial infarction: Secondary | ICD-10-CM | POA: Insufficient documentation

## 2014-07-18 DIAGNOSIS — X58XXXA Exposure to other specified factors, initial encounter: Secondary | ICD-10-CM | POA: Diagnosis not present

## 2014-07-18 DIAGNOSIS — Z8673 Personal history of transient ischemic attack (TIA), and cerebral infarction without residual deficits: Secondary | ICD-10-CM | POA: Insufficient documentation

## 2014-07-18 DIAGNOSIS — K219 Gastro-esophageal reflux disease without esophagitis: Secondary | ICD-10-CM

## 2014-07-18 DIAGNOSIS — I6529 Occlusion and stenosis of unspecified carotid artery: Secondary | ICD-10-CM

## 2014-07-18 DIAGNOSIS — I739 Peripheral vascular disease, unspecified: Secondary | ICD-10-CM | POA: Insufficient documentation

## 2014-07-18 DIAGNOSIS — S30820A Blister (nonthermal) of lower back and pelvis, initial encounter: Secondary | ICD-10-CM | POA: Insufficient documentation

## 2014-07-18 DIAGNOSIS — E119 Type 2 diabetes mellitus without complications: Secondary | ICD-10-CM

## 2014-07-18 DIAGNOSIS — Z87440 Personal history of urinary (tract) infections: Secondary | ICD-10-CM | POA: Insufficient documentation

## 2014-07-18 DIAGNOSIS — Z87891 Personal history of nicotine dependence: Secondary | ICD-10-CM | POA: Insufficient documentation

## 2014-07-18 DIAGNOSIS — E785 Hyperlipidemia, unspecified: Secondary | ICD-10-CM | POA: Diagnosis not present

## 2014-07-18 DIAGNOSIS — I10 Essential (primary) hypertension: Secondary | ICD-10-CM | POA: Diagnosis not present

## 2014-07-18 DIAGNOSIS — Y929 Unspecified place or not applicable: Secondary | ICD-10-CM | POA: Insufficient documentation

## 2014-07-18 DIAGNOSIS — R3915 Urgency of urination: Secondary | ICD-10-CM

## 2014-07-18 LAB — POCT URINALYSIS DIPSTICK
Bilirubin, UA: NEGATIVE
Glucose, UA: NEGATIVE
Ketones, UA: NEGATIVE
Leukocytes, UA: NEGATIVE
Nitrite, UA: NEGATIVE
PH UA: 7.5
PROTEIN UA: NEGATIVE
RBC UA: NEGATIVE
SPEC GRAV UA: 1.01
UROBILINOGEN UA: 0.2

## 2014-07-18 LAB — GLUCOSE, POCT (MANUAL RESULT ENTRY): POC Glucose: 152 mg/dl — AB (ref 70–99)

## 2014-07-18 MED ORDER — AMLODIPINE BESYLATE 10 MG PO TABS: 10.0000 mg | ORAL_TABLET | Freq: Every day | ORAL | Status: DC

## 2014-07-18 MED ORDER — SIMVASTATIN 20 MG PO TABS: 20.0000 mg | ORAL_TABLET | Freq: Every day | ORAL | Status: DC

## 2014-07-18 MED ORDER — CLONIDINE HCL 0.2 MG PO TABS: 0.2000 mg | ORAL_TABLET | Freq: Every day | ORAL | Status: DC

## 2014-07-18 MED ORDER — HYDRALAZINE HCL 10 MG PO TABS: 10.0000 mg | ORAL_TABLET | Freq: Two times a day (BID) | ORAL | Status: DC

## 2014-07-18 MED ORDER — CLOPIDOGREL BISULFATE 75 MG PO TABS: 75.0000 mg | ORAL_TABLET | Freq: Every day | ORAL | Status: DC

## 2014-07-18 MED ORDER — PANTOPRAZOLE SODIUM 40 MG PO TBEC: 40.0000 mg | DELAYED_RELEASE_TABLET | Freq: Every day | ORAL | Status: DC

## 2014-07-18 NOTE — Progress Notes (Signed)
Pt is here concerned that she is having polyuria and wakes up multiple times a night with urgency.  Pt states that she has a cyst on her buttocks. She also want to check her blood work because she thinks that she is taking too much medications.

## 2014-07-18 NOTE — Progress Notes (Signed)
Patient ID: Kaitlin Smith, female   DOB: 1929/12/31, 80 y.o.   MRN: [id number]  CC: urinary urgency and abscess  HPI:  Patient would like blood work because she does not feel she needs so many medications. She has been having urinary pressure and hesitancy for one month.  Patient reports that she has had several UTI's in the past and has had multiple antibiotics.  Patient reports that on her last hospital visit she had to have a molasses enema to help with constipation.  She denies fever, chills, hematuria.  She states that she has a blister on her buttock for one week. She believes the depends has been rubbing against the area to make it sore.  She would like a multi-vitamin.   On Labetalol 100 mg only when pressure is 160 or over.   Allergies  Allergen Reactions  . Milk-Related Compounds     Lactose intolerant.     Past Medical History  Diagnosis Date  . Diabetes mellitus without complication   . Hypertension   . Stroke   . Myocardial infarction   . Peripheral vascular disease    Current Outpatient Prescriptions on File Prior to Visit  Medication Sig Dispense Refill  . acetaminophen (TYLENOL) 500 MG tablet Take 500 mg by mouth every 6 (six) hours as needed for moderate pain.       Marland Kitchen. amLODipine (NORVASC) 10 MG tablet Take 10 mg by mouth daily.      . cloNIDine (CATAPRES) 0.2 MG tablet Take 0.2 mg by mouth daily.       Marland Kitchen. docusate sodium (COLACE) 100 MG capsule Take 100 mg by mouth daily with breakfast.       . haloperidol (HALDOL) 0.5 MG tablet Take 0.5-1 mg by mouth daily as needed (for hallucanations). Takes 1mg  in the morning and may take another tablet in the afternoon if needed      . hydrALAZINE (APRESOLINE) 10 MG tablet Take 10 mg by mouth 2 (two) times daily.       Marland Kitchen. labetalol (NORMODYNE) 200 MG tablet Take 200 mg by mouth daily as needed (for blood pressure).       Marland Kitchen. LANTUS 100 UNIT/ML injection Inject 0-10 Units into the skin at bedtime. Sliding scale      . omeprazole (PRILOSEC)  20 MG capsule Take 20 mg by mouth daily with breakfast.       . polyethylene glycol (MIRALAX / GLYCOLAX) packet Take 17 g by mouth daily as needed (constipation).      . simvastatin (ZOCOR) 20 MG tablet Take 20 mg by mouth at bedtime.      . Cephalexin 500 MG tablet Take 1 tablet (500 mg total) by mouth 4 (four) times daily.  28 tablet  0  . clopidogrel (PLAVIX) 75 MG tablet Take 75 mg by mouth daily.      . Triamcinolone Acetonide (KENALOG IJ) Inject as directed. Had at Dr's office in her right knee       No current facility-administered medications on file prior to visit.   Family History  Problem Relation Age of Onset  . Diabetes Mother   . Stroke Mother   . Peripheral vascular disease Mother     amputation  . Cancer Brother   . Diabetes Brother   . Alcohol abuse Brother   . Varicose Veins Brother   . Diabetes Sister   . Hypertension Father   . Varicose Veins Father   . Heart attack Father    History  Social History  . Marital Status: Widowed    Spouse Name: N/A    Number of Children: N/A  . Years of Education: N/A   Occupational History  . Not on file.   Social History Main Topics  . Smoking status: Former Smoker    Types: Cigarettes    Quit date: 06/06/2002  . Smokeless tobacco: Not on file  . Alcohol Use: No  . Drug Use: No  . Sexual Activity: Not on file   Other Topics Concern  . Not on file   Social History Narrative  . No narrative on file    Review of Systems: Constitutional: Negative for fever, chills, diaphoresis, activity change, appetite change and fatigue. HENT: Negative for ear pain, nosebleeds, congestion, facial swelling, rhinorrhea, neck pain, neck stiffness and ear discharge.  Eyes: Negative for pain, discharge, redness, itching and visual disturbance. Respiratory: Negative for cough, choking, chest tightness, shortness of breath, wheezing and stridor.  Cardiovascular: Negative for chest pain, palpitations and leg  swelling. Gastrointestinal: Negative for abdominal distention. Genitourinary: Negative for dysuria, urgency, frequency, hematuria, flank pain, decreased urine volume, difficulty urinating and dyspareunia.  Musculoskeletal: Negative for back pain, joint swelling, arthralgias and gait problem. Neurological: Negative for dizziness, tremors, seizures, syncope, facial asymmetry, speech difficulty, weakness, light-headedness, numbness and headaches.  Hematological: Negative for adenopathy. Does not bruise/bleed easily. Psychiatric/Behavioral: Negative for hallucinations, behavioral problems, confusion, dysphoric mood, decreased concentration and agitation.    Objective:   Filed Vitals:   07/18/14 1231  BP: 120/71  Pulse: 70  Temp: 98.4 F (36.9 C)  Resp: 14    Physical Exam  Constitutional: She is oriented to person, place, and time.  HENT:  Right Ear: External ear normal.  Left Ear: External ear normal.  Mouth/Throat: Oropharynx is clear and moist.  Neck: Normal range of motion.  Cardiovascular: Normal rate, regular rhythm and normal heart sounds.   Pulmonary/Chest: Effort normal and breath sounds normal.  Abdominal: Soft. Bowel sounds are normal. She exhibits no distension. There is no tenderness.  Musculoskeletal: Normal range of motion. She exhibits no edema.  Neurological: She is alert and oriented to person, place, and time.  Skin: Skin is warm and dry.        Lab Results  Component Value Date   WBC 8.4 06/14/2014   HGB 14.9 06/14/2014   HCT 43.4 06/14/2014   MCV 92.1 06/14/2014   PLT 253 06/14/2014   Lab Results  Component Value Date   CREATININE 0.68 06/14/2014   BUN 10 06/14/2014   NA 137 06/14/2014   K 4.6 06/14/2014   CL 97 06/14/2014   CO2 28 06/14/2014    Lab Results  Component Value Date   HGBA1C 5.4 05/12/2014   Lipid Panel  No results found for this basename: chol, trig, hdl, cholhdl, vldl, ldlcalc       Assessment and plan:   Kaitlin Smith was seen today for  follow-up.  Diagnoses and associated orders for this visit:  Type 2 diabetes mellitus without complication - Glucose (CBG)  Essential hypertension - hydrALAZINE (APRESOLINE) 10 MG tablet; Take 1 tablet (10 mg total) by mouth 2 (two) times daily. - amLODipine (NORVASC) 10 MG tablet; Take 1 tablet (10 mg total) by mouth daily. - cloNIDine (CATAPRES) 0.2 MG tablet; Take 1 tablet (0.2 mg total) by mouth daily. - Lipid panel; Future Patient blood pressure is stable and may continue on current medication.  Education on diet, exercise, and modifiable risk factors discussed. Will obtain appropriate labs as  needed. Will follow up in 3-6 months.   HLD (hyperlipidemia) - simvastatin (ZOCOR) 20 MG tablet; Take 1 tablet (20 mg total) by mouth at bedtime. Education provided on proper lifestyle changes in order to lower cholesterol. Patient advised to maintain healthy weight and to keep total fat intake at 25-35% of total calories and carbohydrates 50-60% of total daily calories. Explained how high cholesterol places patient at risk for heart disease. Patient placed on appropriate medication and repeat labs in 6 months   Urinary urgency - POCT urinalysis dipstick - Ambulatory referral to Urology  History of CVA (cerebrovascular accident) - clopidogrel (PLAVIX) 75 MG tablet; Take 1 tablet (75 mg total) by mouth daily.  Gastroesophageal reflux disease, esophagitis presence not specified - pantoprazole (PROTONIX) 40 MG tablet; Take 1 tablet (40 mg total) by mouth daily. Discussed diet and weight with patient relating to acid reflux.  Went over things that may exacerbate acid reflux such as tomatoes, spicy foods, coffee, carbonated beverages, chocolates, etc.  Advised patient to avoid laying down at least two hours after meals and sleep with HOB elevated.    Return in about 3 months (around 10/18/2014).        Holland CommonsKECK, VALERIE, NP-C [facility] [phone] 07/18/2014, 12:37 PM

## 2014-07-22 ENCOUNTER — Encounter (HOSPITAL_COMMUNITY): Payer: Federal, State, Local not specified - PPO

## 2014-07-31 ENCOUNTER — Ambulatory Visit (HOSPITAL_COMMUNITY)
Admission: RE | Admit: 2014-07-31 | Discharge: 2014-07-31 | Disposition: A | Discharge status: Home / Self Care | Payer: Federal, State, Local not specified - PPO | Source: Ambulatory Visit | Attending: Cardiovascular Disease | Admitting: Cardiovascular Disease

## 2014-07-31 DIAGNOSIS — I70208 Unspecified atherosclerosis of native arteries of extremities, other extremity: Secondary | ICD-10-CM | POA: Insufficient documentation

## 2014-07-31 DIAGNOSIS — R0989 Other specified symptoms and signs involving the circulatory and respiratory systems: Secondary | ICD-10-CM | POA: Diagnosis present

## 2014-07-31 DIAGNOSIS — I779 Disorder of arteries and arterioles, unspecified: Secondary | ICD-10-CM | POA: Diagnosis not present

## 2014-07-31 DIAGNOSIS — E119 Type 2 diabetes mellitus without complications: Secondary | ICD-10-CM | POA: Insufficient documentation

## 2014-07-31 NOTE — Progress Notes (Signed)
Arterial Duplex Lower Ext. Completed. Ernst Cumpston, BS, RDMS, RVT  

## 2014-08-05 ENCOUNTER — Telehealth: Payer: Self-pay | Admitting: Cardiovascular Disease

## 2014-08-08 ENCOUNTER — Telehealth: Payer: Self-pay | Admitting: *Deleted

## 2014-08-08 NOTE — Telephone Encounter (Signed)
-----   Message from Santiago Bumpersichard Tuchman, DPM sent at 08/06/2014  8:01 AM EST ----- Lower extremity arterial Doppler dated 08/04/2014 Results demonstrate arterial insufficiency A follow-up consult was arranged with Dr. Allyson SabalBerry directly with the patient Confirm with patient that she is aware of a schedule follow-up

## 2014-08-08 NOTE — Telephone Encounter (Signed)
I called and left the patient a message that I was following up per Dr. Leeanne Deeduchman.  He wants to make sure you are aware of you appointment with Dr. Nanetta BattyJonathan Berry on 08/20/2014 at 12N.  Feel free to give us a call if you have any questions or concerns.

## 2014-08-12 NOTE — Telephone Encounter (Signed)
Closed encounter °

## 2014-08-20 ENCOUNTER — Encounter: Payer: Self-pay | Admitting: Cardiovascular Disease

## 2014-08-20 ENCOUNTER — Ambulatory Visit (INDEPENDENT_AMBULATORY_CARE_PROVIDER_SITE_OTHER): Payer: Federal, State, Local not specified - PPO | Admitting: Cardiovascular Disease

## 2014-08-20 VITALS — BP 138/70 | HR 71 | Ht 67.0 in | Wt 146.0 lb

## 2014-08-20 DIAGNOSIS — I252 Old myocardial infarction: Secondary | ICD-10-CM

## 2014-08-20 DIAGNOSIS — I10 Essential (primary) hypertension: Secondary | ICD-10-CM

## 2014-08-20 DIAGNOSIS — I739 Peripheral vascular disease, unspecified: Secondary | ICD-10-CM | POA: Insufficient documentation

## 2014-08-20 DIAGNOSIS — Z79899 Other long term (current) drug therapy: Secondary | ICD-10-CM

## 2014-08-20 DIAGNOSIS — E785 Hyperlipidemia, unspecified: Secondary | ICD-10-CM | POA: Insufficient documentation

## 2014-08-20 NOTE — Assessment & Plan Note (Signed)
The patient gives a history of "silent myocardial infarction sometime last year. She was never formally evaluated for this. She gets occasional atypical chest pain.

## 2014-08-20 NOTE — Assessment & Plan Note (Signed)
History of hyperlipidemia on simvastatin 20 mg a day. We will check a lipid and liver profile

## 2014-08-20 NOTE — Assessment & Plan Note (Signed)
History of hypertension with blood pressure measured today in the office at 138/70. She is on amlodipine, clonidine, hydralazine. Continue current medications at current doses

## 2014-08-20 NOTE — Patient Instructions (Addendum)
Dr. Allyson SabalBerry has ordered for you to have lab work done in the next week or two weeks and you MUST be FASTING.  Your physician wants you to follow-up in 6 months with Dr. Allyson SabalBerry.. You will receive a reminder letter in the mail 2 months in advance. If you do not receive a letter, please call our office to schedule the follow-up appointment.

## 2014-08-20 NOTE — Progress Notes (Signed)
08/20/2014 Kaitlin Smith   06-Dec-1929  [id number]  Primary Physician Kaitlin Smith, Kaitlin Smith, Kaitlin Smith Primary Cardiologist: Kaitlin GessJonathan J. Skylynne Schlechter MD Kaitlin Smith,FACC,FAHA, FSCAI   HPI:  [name] is a 80 year old mildly overweight widowed African-American female with no children who lives with her niece who accompanies her today. [place]. She was referred by Dr. Leeanne Smith, her podiatrist, for peripheral vascular evaluation because of nonpalpable pulses. Her cardiovascular risk factor profile is notable for hyperlipidemia, diabetes and hypertension. She apparently set a stroke in March of this year with known carotid artery disease. She relates occlusion of her right carotid and known left carotid disease but she declined revascularization. She apparently also had a myocardial infarction in 2014 but was unaware of this. She gets occasional chest pain. She stopped smoking 2 years ago but was an infrequent smoker. She denies claudication. She does have symptoms compatible with diabetic peripheral neuropathy. Recent lower extremity arterial Doppler studies performed in our office 07/31/14 revealed ABIs of 0.8 bilaterally with high-grade bilateral SFA disease and severe tibial vessel disease. Her TBIs  were any 0.5 range bilaterally as well.   Current Outpatient Prescriptions  Medication Sig Dispense Refill  . amLODipine (NORVASC) 10 MG tablet Take 1 tablet (10 mg total) by mouth daily. 30 tablet 3  . cloNIDine (CATAPRES) 0.2 MG tablet Take 1 tablet (0.2 mg total) by mouth daily. 30 tablet 3  . clopidogrel (PLAVIX) 75 MG tablet Take 1 tablet (75 mg total) by mouth daily. 30 tablet 3  . docusate sodium (COLACE) 100 MG capsule Take 100 mg by mouth daily with breakfast.     . hydrALAZINE (APRESOLINE) 10 MG tablet Take 1 tablet (10 mg total) by mouth 2 (two) times daily. 60 tablet 3  . LANTUS 100 UNIT/ML injection Inject 0-10 Units into the skin at bedtime. Sliding scale    .  LUMIGAN 0.01 % SOLN     . pantoprazole (PROTONIX) 40 MG tablet Take 1 tablet (40 mg total) by mouth daily. 30 tablet 3  . polyethylene glycol (MIRALAX / GLYCOLAX) packet Take 17 g by mouth daily as needed (constipation).    Marland Kitchen. SIMBRINZA 1-0.2 % SUSP     . simvastatin (ZOCOR) 20 MG tablet Take 1 tablet (20 mg total) by mouth at bedtime. 30 tablet 3  . Triamcinolone Acetonide (KENALOG IJ) Inject as directed. Had at Dr's office in her right knee     No current facility-administered medications for this visit.    Allergies  Allergen Reactions  . Milk-Related Compounds     Lactose intolerant.      History   Social History  . Marital Status: Widowed    Spouse Name: N/A    Number of Children: N/A  . Years of Education: N/A   Occupational History  . Not on file.   Social History Main Topics  . Smoking status: Former Smoker    Types: Cigarettes    Quit date: 06/06/2002  . Smokeless tobacco: Not on file  . Alcohol Use: No  . Drug Use: No  . Sexual Activity: Not on file   Other Topics Concern  . Not on file   Social History Narrative     Review of Systems: General: negative for chills, fever, night sweats or weight changes.  Cardiovascular: negative for chest pain, dyspnea on exertion, edema, orthopnea, palpitations, paroxysmal nocturnal dyspnea or shortness of breath Dermatological: negative for rash Respiratory: negative for cough or wheezing Urologic: negative for  hematuria Abdominal: negative for nausea, vomiting, diarrhea, bright red blood per rectum, melena, or hematemesis Neurologic: negative for visual changes, syncope, or dizziness All other systems reviewed and are otherwise negative except as noted above.    Blood pressure 138/70, pulse 71, height 5\' 7"  (1.702 m), weight 146 lb (66.225 kg).  General appearance: alert and no distress Neck: no adenopathy, no JVD, supple, symmetrical, trachea midline, thyroid not enlarged, symmetric, no tenderness/mass/nodules and  soft left carotid bruit Lungs: clear to auscultation bilaterally Heart: regular rate and rhythm, S1, S2 normal, no murmur, click, rub or gallop Extremities: extremities normal, atraumatic, no cyanosis or edema and absent pedal pulses  EKG normal sinus rhythm at 71 with left anterior fascicular block and septal Q waves. I personally reviewed his EKG  ASSESSMENT AND PLAN:   Essential hypertension History of hypertension with blood pressure measured today in the office at 138/70. She is on amlodipine, clonidine, hydralazine. Continue current medications at current doses  Peripheral arterial disease Recent lower extremity arterial Dopplers performed on 07/31/14, ordered by Dr. Leeanne Smith, revealed ABIs in the low 0.8 range bilaterally with high frequency signals in both mid SFAs and severe infrapopliteal disease with occluded dorsalis pedis and peroneal arteries. Her T ABIs were 0.59 on the right and 0.51 on the left making her perfusion.For performing any podiatry procedures. At this point she has no open wounds nor does she complain of claudication. Conservative therapy is recommended.  Occlusion and stenosis of carotid artery without mention of cerebral infarction The patient had a recent stroke in March of this year. Apparently she hasn't occluded right carotid with left carotid disease as well. She was offered revascularization but declined.  Hyperlipidemia History of hyperlipidemia on simvastatin 20 mg a day. We will check a lipid and liver profile  History of MI (myocardial infarction) The patient gives a history of "silent myocardial infarction sometime last year. She was never formally evaluated for this. She gets occasional atypical chest pain.      Kaitlin GessJonathan J. Kaylany Tesoriero MD FACP,FACC,FAHA, [facility]SCAI 08/20/2014 12:52 PM

## 2014-08-20 NOTE — Assessment & Plan Note (Signed)
Recent lower extremity arterial Dopplers performed on 07/31/14, ordered by Dr. Leeanne Deeduchman, revealed ABIs in the low 0.8 range bilaterally with high frequency signals in both mid SFAs and severe infrapopliteal disease with occluded dorsalis pedis and peroneal arteries. Her T ABIs were 0.59 on the right and 0.51 on the left making her perfusion.For performing any podiatry procedures. At this point she has no open wounds nor does she complain of claudication. Conservative therapy is recommended.

## 2014-08-20 NOTE — Assessment & Plan Note (Signed)
The patient had a recent stroke in March of this year. Apparently she hasn't occluded right carotid with left carotid disease as well. She was offered revascularization but declined.

## 2014-08-26 LAB — LIPID PANEL
Cholesterol: 184 mg/dL (ref 0–200)
HDL: 62 mg/dL (ref >39)
LDL CALC: 106 mg/dL — AB (ref 0–99)
Total CHOL/HDL Ratio: 3 Ratio
Triglycerides: 78 mg/dL (ref <150)
VLDL: 16 mg/dL (ref 0–40)

## 2014-08-26 LAB — HEPATIC FUNCTION PANEL
ALT: 8 U/L (ref 0–35)
AST: 13 U/L (ref 0–37)
Albumin: 3.9 g/dL (ref 3.5–5.2)
Alkaline Phosphatase: 92 U/L (ref 39–117)
BILIRUBIN DIRECT: 0.1 mg/dL (ref 0.0–0.3)
Indirect Bilirubin: 0.6 mg/dL (ref 0.2–1.2)
Total Bilirubin: 0.7 mg/dL (ref 0.2–1.2)
Total Protein: 6.6 g/dL (ref 6.0–8.3)

## 2014-08-27 ENCOUNTER — Telehealth: Payer: Self-pay | Admitting: *Deleted

## 2014-08-27 DIAGNOSIS — E785 Hyperlipidemia, unspecified: Secondary | ICD-10-CM

## 2014-08-27 DIAGNOSIS — Z79899 Other long term (current) drug therapy: Secondary | ICD-10-CM

## 2014-08-27 MED ORDER — EZETIMIBE 10 MG PO TABS: 10.0000 mg | ORAL_TABLET | Freq: Every day | ORAL | Status: DC

## 2014-08-27 NOTE — Telephone Encounter (Signed)
DPR says that I may leave detailed message. Did so, saying that cholesterol numbers were elevated and Dr. Allyson SabalBerry wanted to start Zetia 10 and that it has been called in. Also to have lipid and liver numbers retested in 2 months after starting the medication so around [facility](Feb/early March 2016).

## 2014-08-27 NOTE — Telephone Encounter (Signed)
-----   Message from Runell GessJonathan J Berry, MD sent at 08/26/2014  3:45 PM EST ----- Add zetia 10 and re check.

## 2014-08-27 NOTE — Telephone Encounter (Signed)
Mailing lab slips. Prescription called into the pharmacy.

## 2014-08-27 NOTE — Telephone Encounter (Signed)
Returning your call. °

## 2014-09-26 ENCOUNTER — Institutional Professional Consult (permissible substitution): Payer: Federal, State, Local not specified - PPO | Admitting: Cardiovascular Disease

## 2014-10-08 ENCOUNTER — Ambulatory Visit (INDEPENDENT_AMBULATORY_CARE_PROVIDER_SITE_OTHER): Payer: Federal, State, Local not specified - PPO | Admitting: Podiatry

## 2014-10-08 ENCOUNTER — Encounter: Payer: Self-pay | Admitting: Podiatry

## 2014-10-08 DIAGNOSIS — B351 Tinea unguium: Secondary | ICD-10-CM

## 2014-10-08 DIAGNOSIS — M79676 Pain in unspecified toe(s): Secondary | ICD-10-CM

## 2014-10-08 NOTE — Progress Notes (Signed)
Patient ID: Kaitlin DikeLannie Smith, female   DOB: 03-Aug-1930, 81 y.o.   MRN: [id number]  Subjective: This patient presents today complaining of painful toenails walking wearing shoes  Objective: The toenails are hypertrophic, elongated, incurvated, discolored and tender to palpation 6-10  Assessment: Symptomatic onychomycoses 6-10 Type II diabetic with diminished pedal pulses bilaterally  Plan: Debridement of toenails 10 without a bleeding  Reappoint 3 months

## 2014-10-09 ENCOUNTER — Ambulatory Visit: Payer: Federal, State, Local not specified - PPO | Attending: Internal Medicine | Admitting: Internal Medicine

## 2014-10-09 ENCOUNTER — Encounter: Payer: Self-pay | Admitting: Internal Medicine

## 2014-10-09 VITALS — BP 105/63 | HR 59 | Temp 98.1°F | Resp 114

## 2014-10-09 DIAGNOSIS — R443 Hallucinations, unspecified: Secondary | ICD-10-CM

## 2014-10-09 DIAGNOSIS — K5900 Constipation, unspecified: Secondary | ICD-10-CM

## 2014-10-09 DIAGNOSIS — Z794 Long term (current) use of insulin: Secondary | ICD-10-CM | POA: Diagnosis not present

## 2014-10-09 DIAGNOSIS — E785 Hyperlipidemia, unspecified: Secondary | ICD-10-CM | POA: Insufficient documentation

## 2014-10-09 DIAGNOSIS — I10 Essential (primary) hypertension: Secondary | ICD-10-CM | POA: Diagnosis not present

## 2014-10-09 DIAGNOSIS — E119 Type 2 diabetes mellitus without complications: Secondary | ICD-10-CM | POA: Diagnosis present

## 2014-10-09 DIAGNOSIS — I252 Old myocardial infarction: Secondary | ICD-10-CM | POA: Insufficient documentation

## 2014-10-09 DIAGNOSIS — Z87891 Personal history of nicotine dependence: Secondary | ICD-10-CM | POA: Insufficient documentation

## 2014-10-09 DIAGNOSIS — I739 Peripheral vascular disease, unspecified: Secondary | ICD-10-CM | POA: Insufficient documentation

## 2014-10-09 DIAGNOSIS — Z8673 Personal history of transient ischemic attack (TIA), and cerebral infarction without residual deficits: Secondary | ICD-10-CM | POA: Diagnosis not present

## 2014-10-09 DIAGNOSIS — Z79899 Other long term (current) drug therapy: Secondary | ICD-10-CM | POA: Diagnosis not present

## 2014-10-09 DIAGNOSIS — K219 Gastro-esophageal reflux disease without esophagitis: Secondary | ICD-10-CM | POA: Insufficient documentation

## 2014-10-09 LAB — POCT GLYCOSYLATED HEMOGLOBIN (HGB A1C): Hemoglobin A1C: 5.4

## 2014-10-09 LAB — GLUCOSE, POCT (MANUAL RESULT ENTRY): POC Glucose: 163 mg/dl — AB (ref 70–99)

## 2014-10-09 MED ORDER — CLONIDINE HCL 0.2 MG PO TABS: 0.2000 mg | ORAL_TABLET | Freq: Every day | ORAL | Status: DC

## 2014-10-09 MED ORDER — EZETIMIBE 10 MG PO TABS: 10.0000 mg | ORAL_TABLET | Freq: Every day | ORAL | Status: DC

## 2014-10-09 MED ORDER — CLOPIDOGREL BISULFATE 75 MG PO TABS: 75.0000 mg | ORAL_TABLET | Freq: Every day | ORAL | Status: DC

## 2014-10-09 MED ORDER — POLYETHYLENE GLYCOL 3350 17 G PO PACK: 17.0000 g | PACK | Freq: Every day | ORAL | Status: DC | PRN

## 2014-10-09 MED ORDER — HYDRALAZINE HCL 10 MG PO TABS: 10.0000 mg | ORAL_TABLET | Freq: Two times a day (BID) | ORAL | Status: DC

## 2014-10-09 MED ORDER — PANTOPRAZOLE SODIUM 40 MG PO TBEC: 40.0000 mg | DELAYED_RELEASE_TABLET | Freq: Every day | ORAL | Status: DC

## 2014-10-09 MED ORDER — INSULIN GLARGINE 100 UNIT/ML ~~LOC~~ SOLN: 0.0000 [IU] | Freq: Every day | SUBCUTANEOUS | Status: DC

## 2014-10-09 MED ORDER — AMLODIPINE BESYLATE 10 MG PO TABS: 10.0000 mg | ORAL_TABLET | Freq: Every day | ORAL | Status: DC

## 2014-10-09 MED ORDER — DOCUSATE SODIUM 100 MG PO CAPS: 100.0000 mg | ORAL_CAPSULE | Freq: Every day | ORAL | Status: DC

## 2014-10-09 NOTE — Progress Notes (Signed)
Pt is here following up on her hyperlipidemia, HTN and her diabetes. Pt reports having body aches and pains mostly at night time.  Pt needs her medications refilled.

## 2014-10-09 NOTE — Progress Notes (Signed)
Patient ID: Kaitlin Smith, female   DOB: Jun 02, 1930, 81 y.o.   MRN: [id number]  CC: Follow up  HPI: Kaitlin Smith is a 81 y.o. female here today for a follow up visit.  Patient has past medical history of hypertension, T2DM, stroke, MI, PVD, HLD, and blindness.  Patient presents to clinic today for a follow up of chronic disease.  She states that she takes her Lantus nightly without any complication and follows a diabetic diet most of the time.  She currently takes 5-10 units of Lantus nightly per sliding scale.  She reports fasting blood sugars of 105-171. Her niece is her primary caregiver. She denies polyuria, polydipsia, neuropathy, headaches, or dizziness.  She takes her anti-hypertensives daily without complications of edema, chest pain, or palpitations.  Today she presents for medication refills and for generalized body aches at night that have been present for 3-4 weeks.  Patient is requesting a CNA to help assist her with baths and ADL's.  She is unable to do many of these task due to residual from a stroke in 11/2013.  Her niece reports that the patient is beginning to have hallucinations again for the past 3 weeks. She reports seeing or talking to people who are not present.  The patient is usually alert and mostly oriented during these events.     Allergies  Allergen Reactions  . Milk-Related Compounds     Lactose intolerant.     Past Medical History  Diagnosis Date  . Diabetes mellitus without complication   . Hypertension   . Stroke   . Myocardial infarction   . Peripheral vascular disease   . Hyperlipidemia   . Blindness    Current Outpatient Prescriptions on File Prior to Visit  Medication Sig Dispense Refill  . amLODipine (NORVASC) 10 MG tablet Take 1 tablet (10 mg total) by mouth daily. 30 tablet 3  . cloNIDine (CATAPRES) 0.2 MG tablet Take 1 tablet (0.2 mg total) by mouth daily. 30 tablet 3  . clopidogrel (PLAVIX) 75 MG tablet Take 1 tablet (75 mg total) by mouth daily. 30  tablet 3  . docusate sodium (COLACE) 100 MG capsule Take 100 mg by mouth daily with breakfast.     . ezetimibe (ZETIA) 10 MG tablet Take 1 tablet (10 mg total) by mouth daily. 30 tablet 6  . hydrALAZINE (APRESOLINE) 10 MG tablet Take 1 tablet (10 mg total) by mouth 2 (two) times daily. 60 tablet 3  . LANTUS 100 UNIT/ML injection Inject 0-10 Units into the skin at bedtime. Sliding scale    . LUMIGAN 0.01 % SOLN     . pantoprazole (PROTONIX) 40 MG tablet Take 1 tablet (40 mg total) by mouth daily. 30 tablet 3  . polyethylene glycol (MIRALAX / GLYCOLAX) packet Take 17 g by mouth daily as needed (constipation).    Marland Kitchen SIMBRINZA 1-0.2 % SUSP     . simvastatin (ZOCOR) 20 MG tablet Take 1 tablet (20 mg total) by mouth at bedtime. (Patient not taking: Reported on 10/09/2014) 30 tablet 3  . Triamcinolone Acetonide (KENALOG IJ) Inject as directed. Had at Dr's office in her right knee     No current facility-administered medications on file prior to visit.   Family History  Problem Relation Age of Onset  . Diabetes Mother   . Stroke Mother   . Peripheral vascular disease Mother     amputation  . Cancer Brother   . Diabetes Brother   . Alcohol abuse Brother   .  Varicose Veins Brother   . Diabetes Sister   . Hypertension Father   . Varicose Veins Father   . Heart attack Father    History   Social History  . Marital Status: Widowed    Spouse Name: N/A    Number of Children: N/A  . Years of Education: N/A   Occupational History  . Not on file.   Social History Main Topics  . Smoking status: Former Smoker    Types: Cigarettes    Quit date: 06/06/2002  . Smokeless tobacco: Not on file  . Alcohol Use: No  . Drug Use: No  . Sexual Activity: Not on file   Other Topics Concern  . Not on file   Social History Narrative    Review of Systems: See HPI   Objective:   Filed Vitals:   10/09/14 1206  BP: 105/63  Pulse: 59  Temp: 98.1 F (36.7 C)  Resp: 114    Physical Exam   Constitutional: She is oriented to person, place, and time.  Cardiovascular: Normal rate, regular rhythm and normal heart sounds.   Pulmonary/Chest: Effort normal and breath sounds normal.  Abdominal: Soft. Bowel sounds are normal.  Neurological: She is alert and oriented to person, place, and time.  Skin: Skin is warm and dry.     Lab Results  Component Value Date   WBC 8.4 06/14/2014   HGB 14.9 06/14/2014   HCT 43.4 06/14/2014   MCV 92.1 06/14/2014   PLT 253 06/14/2014   Lab Results  Component Value Date   CREATININE 0.68 06/14/2014   BUN 10 06/14/2014   NA 137 06/14/2014   K 4.6 06/14/2014   CL 97 06/14/2014   CO2 28 06/14/2014    Lab Results  Component Value Date   HGBA1C 5.4 05/12/2014   Lipid Panel     Component Value Date/Time   CHOL 184 08/25/2014 1051   TRIG 78 08/25/2014 1051   HDL 62 08/25/2014 1051   CHOLHDL 3.0 08/25/2014 1051   VLDL 16 08/25/2014 1051   LDLCALC 106* 08/25/2014 1051       Assessment and plan:   Kaitlin Smith was seen today for follow-up.  Diagnoses and associated orders for this visit:  Type 2 diabetes mellitus without complication - Glucose (CBG) - HgB A1c - insulin glargine (LANTUS) 100 UNIT/ML injection; Inject 0-0.1 mLs (0-10 Units total) into the skin at bedtime. Sliding scale Patients diabetes is well control as evidence by consistently low a1c.  Patient will continue with current therapy and continue to make necessary lifestyle changes.  Reviewed foot care, diet, exercise, annual health maintenance with patient.   Essential hypertension - amLODipine (NORVASC) 10 MG tablet; Take 1 tablet (10 mg total) by mouth daily. - cloNIDine (CATAPRES) 0.2 MG tablet; Take 1 tablet (0.2 mg total) by mouth daily. - hydrALAZINE (APRESOLINE) 10 MG tablet; Take 1 tablet (10 mg total) by mouth 2 (two) times daily. Patient blood pressure is stable and may continue on current medication.  Education on diet, exercise, and modifiable risk factors  discussed. Will obtain appropriate labs as needed. Will follow up in 3-6 months.   History of CVA (cerebrovascular accident) - clopidogrel (PLAVIX) 75 MG tablet; Take 1 tablet (75 mg total) by mouth daily. - ezetimibe (ZETIA) 10 MG tablet; Take 1 tablet (10 mg total) by mouth daily. - Ambulatory referral to Physical Therapy  Hallucinations - Ambulatory referral to Neurology--r/o dementia, etc  Gastroesophageal reflux disease, esophagitis presence not specified - pantoprazole (PROTONIX)  40 MG tablet; Take 1 tablet (40 mg total) by mouth daily.  Constipation, unspecified constipation type - docusate sodium (COLACE) 100 MG capsule; Take 1 capsule (100 mg total) by mouth daily. - polyethylene glycol (MIRALAX / GLYCOLAX) packet; Take 17 g by mouth daily as needed (constipation).   Return in about 3 months (around 01/08/2015).        Holland CommonsKECK, Kailee Essman, NP-C [facility] [phone] 10/09/2014, 12:14 PM

## 2014-10-23 ENCOUNTER — Encounter: Payer: Self-pay | Admitting: Cardiology

## 2014-10-23 ENCOUNTER — Ambulatory Visit (INDEPENDENT_AMBULATORY_CARE_PROVIDER_SITE_OTHER): Payer: Federal, State, Local not specified - PPO | Admitting: Cardiology

## 2014-10-23 VITALS — BP 208/92 | HR 75 | Ht 65.0 in | Wt 148.7 lb

## 2014-10-23 DIAGNOSIS — M25512 Pain in left shoulder: Secondary | ICD-10-CM

## 2014-10-23 DIAGNOSIS — I10 Essential (primary) hypertension: Secondary | ICD-10-CM

## 2014-10-23 NOTE — Patient Instructions (Signed)
Your physician recommends that you schedule a follow-up appointment in: After test with Vernona RiegerLaura or Dr Allyson SabalBerry  Your physician has requested that you have a lexiscan myoview. For further information please visit [email or web address]. Please follow instruction sheet, as given.

## 2014-10-23 NOTE — Progress Notes (Signed)
Cardiology Office Note   Date:  10/23/2014   ID:  Kaitlin Smith, DOB September 13, 1930, MRN [id number]  PCP:  Holland Commons, NP  Cardiologist:  Dr. Allyson Sabal    Chief Complaint  Patient presents with  . Chest Pain    described as pain in both arm mostly left arm, pt denied chest pain and SOB      History of Present Illness: Kaitlin Smith is a 81 y.o. female who presents for evaluation of mostly left arm pain but both arms that only occurs at rest. She has no history of arthritis in her shoulders previously she has no actual chest pain and no shortness of breath but previously she has had a silent myocardial infarctions she's concerned this may be her heart and his angina.  This is been occurring for 3-4 weeks. It bothers her for some time at night. She has no associated symptoms of nausea dizziness diaphoresis or shortness of breath. She notes she has not had any recent stress test.  Today her blood pressure is quite elevated she had abruptly soup for lunch and she's concerned there was too much salt she's also been very busy today with a dental appointment.  Patient was originally seen by Dr. Gery Pray in December after moving down from [place].  Her cardiovascular risk factor profile is notable for hyperlipidemia, diabetes and hypertension. She apparently had a stroke in March of 2015 with known carotid artery disease. She relates occlusion of her right carotid and known left carotid disease but she declined revascularization. She apparently also had a myocardial infarction in 2014 but was unaware of this. She gets occasional chest pain. She stopped smoking 2 years ago but was an infrequent smoker. She denies claudication. She does have symptoms compatible with diabetic peripheral neuropathy. Recent lower extremity arterial Doppler studies performed in our office 07/31/14 revealed ABIs of 0.8 bilaterally with high-grade bilateral SFA disease and severe tibial vessel disease. Her TBIs were any 0.5 range  bilaterally as well  Her last lipid panel LDL was 106 she is on Zetia.  She previously had been on Zocor I am not quite sure why she stopped it but she should be on both Zocor and Zetia.  Past Medical History  Diagnosis Date  . Diabetes mellitus without complication   . Hypertension   . Stroke   . Myocardial infarction   . Peripheral vascular disease   . Hyperlipidemia   . Blindness     No past surgical history on file.   Current Outpatient Prescriptions  Medication Sig Dispense Refill  . amLODipine (NORVASC) 10 MG tablet Take 1 tablet (10 mg total) by mouth daily. 30 tablet 3  . ciprofloxacin (CILOXAN) 0.3 % ophthalmic solution Place 1 drop into the right eye 4 (four) times daily. Administer 1 drop, every 2 hours, while awake, for 2 days. Then 1 drop, every 4 hours, while awake, for the next 5 days.    . cloNIDine (CATAPRES) 0.2 MG tablet Take 1 tablet (0.2 mg total) by mouth daily. 30 tablet 3  . clopidogrel (PLAVIX) 75 MG tablet Take 1 tablet (75 mg total) by mouth daily. 30 tablet 3  . docusate sodium (COLACE) 100 MG capsule Take 1 capsule (100 mg total) by mouth daily. 60 capsule 3  . DUREZOL 0.05 % EMUL Place 1 tablet into the right eye 4 (four) times daily.    Marland Kitchen ezetimibe (ZETIA) 10 MG tablet Take 1 tablet (10 mg total) by mouth daily. 30 tablet 6  .  hydrALAZINE (APRESOLINE) 10 MG tablet Take 1 tablet (10 mg total) by mouth 2 (two) times daily. 60 tablet 3  . ILEVRO 0.3 % SUSP Place 1 tablet into the right eye daily.    . insulin glargine (LANTUS) 100 UNIT/ML injection Inject 0-0.1 mLs (0-10 Units total) into the skin at bedtime. Sliding scale 10 mL 3  . LUMIGAN 0.01 % SOLN     . pantoprazole (PROTONIX) 40 MG tablet Take 1 tablet (40 mg total) by mouth daily. 30 tablet 3  . polyethylene glycol (MIRALAX / GLYCOLAX) packet Take 17 g by mouth daily as needed (constipation). 14 each   . SIMBRINZA 1-0.2 % SUSP     . Triamcinolone Acetonide (KENALOG IJ) Inject as directed. Had at  Dr's office in her right knee     No current facility-administered medications for this visit.    Allergies:   Milk-related compounds    Social History:  The patient  reports that she quit smoking about 12 years ago. Her smoking use included Cigarettes. She does not have any smokeless tobacco history on file. She reports that she does not drink alcohol or use illicit drugs.   Family History:  The patient's family history includes Alcohol abuse in her brother; Cancer in her brother; Diabetes in her brother, mother, and sister; Heart attack in her father; Hypertension in her father; Peripheral vascular disease in her mother; Stroke in her mother; Varicose Veins in her brother and father.    ROS:  General:no colds or fevers, no weight changes Skin:no rashes or ulcers HEENT:no blurred vision, no congestion CV:see HPI PUL:see HPI GI:no diarrhea constipation or melena, no indigestion GU:no hematuria, no dysuria MS:no joint pain, no claudication, chronic rt ankle pain Neuro:no syncope, no lightheadedness Endo:+ diabetes  stable, no thyroid disease   Wt Readings from Last 3 Encounters:  10/23/14 148 lb 11.2 oz (67.45 kg)  08/20/14 146 lb (66.225 kg)  06/06/14 159 lb (72.122 kg)     PHYSICAL EXAM: VS:  BP 208/92 mmHg  Pulse 75  Ht  (1.651 m)  Wt 148 lb 11.2 oz (67.45 kg)  BMI 24.75 kg/m2 , BMI Body mass index is 24.75 kg/(m^2). General:Pleasant affect, NAD Skin:Warm and dry, brisk capillary refill HEENT:normocephalic, sclera clear, mucus membranes moist Neck:supple, no JVD, + lt carotid bruit, no adenopathy  Heart:S1S2 RRR without murmur, gallup, rub or click Lungs:clear without rales, rhonchi, or wheezes GUY:QIHK, non tender, + BS, do not palpate liver spleen or masses Ext:+ rt ankle edema other wise no lower ext edema,  2+ radial pulses Neuro:alert and oriented X 3, MAE, follows commands, + facial symmetry   EKG:  EKG is ordered today. The ekg ordered today demonstrates  SR with 1st degree AV block LVH LAD, slightly increased 1st degree av block   Recent Labs: 05/03/2014: Pro B Natriuretic peptide (BNP) 81.7 06/14/2014: BUN 10; Creatinine 0.68; Hemoglobin 14.9; Platelets 253; Potassium 4.6; Sodium 137 08/25/2014: ALT 8    Lipid Panel    Component Value Date/Time   CHOL 184 08/25/2014 1051   TRIG 78 08/25/2014 1051   HDL 62 08/25/2014 1051   CHOLHDL 3.0 08/25/2014 1051   VLDL 16 08/25/2014 1051   LDLCALC 106* 08/25/2014 1051       Other studies Reviewed: Additional studies/ records that were reviewed today include: none.   ASSESSMENT AND PLAN:  1.  Shoulder pain at night, no hx of arthritis there, previous silent MI and numerous risk factors for coronary disease. We'll plan  Lexiscan Myoview was patient cannot ambulate on a treadmill. She will follow-up with Dr. Allyson SabalBerry or an APP post testing.  2. Hx of MI  See above.  3.  PVD with carotid disease-  Recent lower extremity arterial Dopplers performed on 07/31/14, ordered by Dr. Leeanne Deeduchman, revealed ABIs in the low 0.8 range bilaterally with high frequency signals in both mid SFAs and severe infrapopliteal disease with occluded dorsalis pedis and peroneal arteries. Her T ABIs were 0.59 on the right and 0.51 on the left making her perfusion.For performing any podiatry procedures. At this point she has no open wounds nor does she complain of claudication. Conservative therapy is recommended.  4. Hyperlipidemia, needs improved control of lipids will make sure pt on zetia and zocor.  5.  DM-2 followed by PCP  6. HTN, essential uncontrolled today though on recheck 180/90. She'll go home and take another blood pressure hydralazine. Her niece will monitor her blood pressures and call if the systolic is staying greater than 150..  I discussed that with the patient and her niece.   Current medicines are reviewed at length with the patient today.  The patient has no concerns regarding medicines they were reviewed  with pt's niece as well who follows the meds..  The following changes have been made:  see above  Labs/ tests ordered today include:see above  Disposition:   FU:  see above  Signed, Leone BrandINGOLD,Kennice Finnie R, NP  10/23/2014 7:05 PM    [facility] [address], [place], [place]  [address]/ [address] [place], [place] Phone: [phone]; Fax: [phone]  [phone]

## 2014-10-27 ENCOUNTER — Telehealth: Payer: Self-pay | Admitting: *Deleted

## 2014-10-27 NOTE — Telephone Encounter (Signed)
Spoke with Ricki Rodriguezdriana at [phone] Prior Auth approved 90 for 90 for pantoprazole 40 mg daily Message left on [facility] prescriber line notifying approval received

## 2014-11-04 ENCOUNTER — Telehealth (HOSPITAL_COMMUNITY): Payer: Self-pay

## 2014-11-04 NOTE — Telephone Encounter (Signed)
Encounter complete. 

## 2014-11-05 ENCOUNTER — Telehealth (HOSPITAL_COMMUNITY): Payer: Self-pay

## 2014-11-05 NOTE — Telephone Encounter (Signed)
Encounter complete. 

## 2014-11-06 ENCOUNTER — Ambulatory Visit (HOSPITAL_COMMUNITY)
Admission: RE | Admit: 2014-11-06 | Discharge: 2014-11-06 | Disposition: A | Discharge status: Home / Self Care | Payer: Federal, State, Local not specified - PPO | Source: Ambulatory Visit | Attending: Cardiology | Admitting: Cardiology

## 2014-11-06 DIAGNOSIS — M25512 Pain in left shoulder: Secondary | ICD-10-CM | POA: Diagnosis not present

## 2014-11-06 DIAGNOSIS — I739 Peripheral vascular disease, unspecified: Secondary | ICD-10-CM | POA: Insufficient documentation

## 2014-11-06 DIAGNOSIS — R9431 Abnormal electrocardiogram [ECG] [EKG]: Secondary | ICD-10-CM | POA: Insufficient documentation

## 2014-11-06 DIAGNOSIS — Z87891 Personal history of nicotine dependence: Secondary | ICD-10-CM | POA: Insufficient documentation

## 2014-11-06 DIAGNOSIS — M25511 Pain in right shoulder: Secondary | ICD-10-CM | POA: Insufficient documentation

## 2014-11-06 DIAGNOSIS — E785 Hyperlipidemia, unspecified: Secondary | ICD-10-CM | POA: Diagnosis not present

## 2014-11-06 DIAGNOSIS — I10 Essential (primary) hypertension: Secondary | ICD-10-CM

## 2014-11-06 MED ORDER — TECHNETIUM TC 99M SESTAMIBI GENERIC - CARDIOLITE
29.4000 | Freq: Once | INTRAVENOUS | Status: AC | PRN
  Administered 2014-11-06: 29 via INTRAVENOUS

## 2014-11-06 MED ORDER — TECHNETIUM TC 99M SESTAMIBI GENERIC - CARDIOLITE
10.9000 | Freq: Once | INTRAVENOUS | Status: AC | PRN
  Administered 2014-11-06: 10.9 via INTRAVENOUS

## 2014-11-06 MED ORDER — REGADENOSON 0.4 MG/5ML IV SOLN
0.4000 mg | Freq: Once | INTRAVENOUS | Status: AC
  Administered 2014-11-06: 0.4 mg via INTRAVENOUS

## 2014-11-06 NOTE — Procedures (Addendum)
[facility] [address] [place] [address] [phone]  Cardiology Nuclear Med Study  Ascencion DikeLannie Smith is a 81 y.o. female     MRN : [id number]     DOB: Jan 13, 1930  Procedure Date: 11/06/2014  Nuclear Med Background Indication for Stress Test:  Evaluation for Ischemia and Abnormal EKG History:  CAD;MI-2015;No prior NUC MPI for comparison;No prior respiratory history reported Cardiac Risk Factors: Carotid Disease, CVA, History of Smoking, Hypertension, Lipids, PVD and SFA's;Inferior occluded dorsalis pedis & peroneal  Symptoms:  bilateral shoulder pain   Nuclear Pre-Procedure Caffeine/Decaff Intake:  1:00am NPO After: 11am   IV Site: R Forearm  IV 0.9% NS with Angio Cath:  22g  Chest Size (in):  n/a IV Started by: Berdie OgrenAmanda Wease, RN  Height: 5\' 5"  (1.651 m)  Cup Size: C  BMI:  Body mass index is 24.63 kg/(m^2). Weight:  148 lb (67.132 kg)   Tech Comments:  n/a    Nuclear Med Study 1 or 2 day study: 1 day  Stress Test Type:  Lexiscan  Order Authorizing Provider:  Nanetta BattyJonathan Berry, MD   Resting Radionuclide: Technetium 605m Sestamibi  Resting Radionuclide Dose: 10.9 mCi   Stress Radionuclide:  Technetium 105m Sestamibi  Stress Radionuclide Dose: 29.4 mCi           Stress Protocol Rest HR: 65 Stress HR: 99  Rest BP: 192/82 Stress BP: 161/70  Exercise Time (min): n/a METS: n/a   Predicted Max HR: 138 bpm % Max HR: 71.74 bpm Rate Pressure Product: [id number]  Dose of Adenosine (mg):  n/a Dose of Lexiscan: 0.4 mg  Dose of Atropine (mg): n/a Dose of Dobutamine: n/a mcg/kg/min (at max HR)  Stress Test Technologist: Esperanza Sheetserry-Marie Martin, CCT Nuclear Technologist: Gonzella LexPam Phillips, CNMT   Rest Procedure:  Myocardial perfusion imaging was performed at rest 45 minutes following the intravenous administration of Technetium 475m Sestamibi. Stress Procedure:  The patient received IV Lexiscan 0.4 mg over 15-seconds.  Technetium 725m Sestamibi  injected at 30-seconds.  There were no significant changes with Lexiscan.  Quantitative spect images were obtained after a 45 minute delay.  Transient Ischemic Dilatation (Normal <1.22):  0.93  QGS EDV:  80 ml QGS ESV:  29 ml LV Ejection Fraction: 64%    Rest ECG: NSR with first degree AV block.  Stress ECG: No significant ST segment change suggestive of ischemia.  QPS Raw Data Images:  Acquisition technically good; normal left ventricular size. Stress Images:  Normal homogeneous uptake in all areas of the myocardium. Rest Images:  Normal homogeneous uptake in all areas of the myocardium. Subtraction (SDS):  No evidence of ischemia.  Impression Exercise Capacity:  Lexiscan with no exercise. BP Response:  Normal blood pressure response. Clinical Symptoms:  No significant symptoms noted. ECG Impression:  No significant ST segment change suggestive of ischemia. Comparison with Prior Nuclear Study: No previous nuclear study performed  Overall Impression:  Normal stress nuclear study.  LV Wall Motion:  NL LV Function; NL Wall Motion   Olga MillersBrian Crenshaw, MD  11/07/2014 7:49 AM

## 2014-11-20 ENCOUNTER — Ambulatory Visit: Payer: Federal, State, Local not specified - PPO | Admitting: Cardiology

## 2014-11-20 ENCOUNTER — Ambulatory Visit (INDEPENDENT_AMBULATORY_CARE_PROVIDER_SITE_OTHER): Payer: Medicare Other | Admitting: Cardiology

## 2014-11-20 ENCOUNTER — Encounter: Payer: Self-pay | Admitting: Cardiology

## 2014-11-20 VITALS — BP 126/72 | HR 64 | Ht 65.0 in | Wt 145.9 lb

## 2014-11-20 DIAGNOSIS — I10 Essential (primary) hypertension: Secondary | ICD-10-CM

## 2014-11-20 DIAGNOSIS — M25512 Pain in left shoulder: Secondary | ICD-10-CM

## 2014-11-20 DIAGNOSIS — M25511 Pain in right shoulder: Secondary | ICD-10-CM

## 2014-11-20 MED ORDER — SIMVASTATIN 20 MG PO TABS: 20.0000 mg | ORAL_TABLET | Freq: Every day | ORAL | Status: DC

## 2014-11-20 MED ORDER — DICLOFENAC SODIUM 1 % TD GEL: 4.0000 g | Freq: Two times a day (BID) | TRANSDERMAL | Status: DC | PRN

## 2014-11-20 NOTE — Progress Notes (Signed)
Cardiology Office Note   Date:  11/20/2014   ID:  Kaitlin Smith, DOB 1929/10/23, MRN [id number]  PCP:  Kaitlin CommonsKECK, VALERIE, NP  Cardiologist:  Dr. Erlene QuanJ. Smith     Chief Complaint  Patient presents with  . Hypertension    recent Nuclear Study on 2/18; no cardiac complaints      History of Present Illness: Kaitlin Smith is a 81 y.o. female who presents for results of stress test and BP eval.  Recent hx of mostly left arm pain but both arms at times that only occurs at rest. She has no history of arthritis in her shoulders previously she has no actual chest pain and no shortness of breath but previously she has had a silent myocardial infarctions she's concerned this may be her heart and his angina. This is been occurring for 3-4 weeks. It bothers her for some time at night. She has no associated symptoms of nausea dizziness diaphoresis or shortness of breath. She notes she has not had any recent stress test.  On the last visit her blood pressure was quite elevated she had broccili soup for lunch and she's concerned there was too much salt she's ahaso been very busy today with a dental appointment.  Patient was originally seen by Dr. Allyson SabalBerry in December [place]. Her cardiovascular risk factor profile is notable for hyperlipidemia, diabetes and hypertension. She apparently had a stroke in March of 2015 with known carotid artery disease. She relates occlusion of her right carotid and known left carotid disease but she declined revascularization. She apparently also had a myocardial infarction in 2014 but was unaware of this. She gets occasional chest pain. She stopped smoking 2 years ago but was an infrequent smoker. She denies claudication. She does have symptoms compatible with diabetic peripheral neuropathy. Recent lower extremity arterial Doppler studies performed in our office 07/31/14 revealed ABIs of 0.8 bilaterally with high-grade bilateral SFA disease and severe tibial  vessel disease. Her TBIs were any 0.5 range bilaterally as well   Her stress test was normal.  She tells me the pain goes away with 2 tylenol.  We discussed using Bio freeze or voltaren cream as well.  BP improved today.  She and her niece tell me that BP has been controlled at home.   Past Medical History  Diagnosis Date  . Diabetes mellitus without complication   . Hypertension   . Stroke   . Myocardial infarction   . Peripheral vascular disease   . Hyperlipidemia   . Blindness     No past surgical history on file.   Current Outpatient Prescriptions  Medication Sig Dispense Refill  . amLODipine (NORVASC) 10 MG tablet Take 1 tablet (10 mg total) by mouth daily. 30 tablet 3  . cloNIDine (CATAPRES) 0.2 MG tablet Take 1 tablet (0.2 mg total) by mouth daily. 30 tablet 3  . clopidogrel (PLAVIX) 75 MG tablet Take 1 tablet (75 mg total) by mouth daily. 30 tablet 3  . docusate sodium (COLACE) 100 MG capsule Take 1 capsule (100 mg total) by mouth daily. 60 capsule 3  . DUREZOL 0.05 % EMUL Place 1 tablet into the right eye 4 (four) times daily.    Marland Kitchen. ezetimibe (ZETIA) 10 MG tablet Take 1 tablet (10 mg total) by mouth daily. 30 tablet 6  . hydrALAZINE (APRESOLINE) 10 MG tablet Take 1 tablet (10 mg total) by mouth 2 (two) times daily. 60 tablet 3  . insulin glargine (LANTUS) 100 UNIT/ML  injection Inject 0-0.1 mLs (0-10 Units total) into the skin at bedtime. Sliding scale 10 mL 3  . LUMIGAN 0.01 % SOLN     . pantoprazole (PROTONIX) 40 MG tablet Take 1 tablet (40 mg total) by mouth daily. 30 tablet 3  . polyethylene glycol (MIRALAX / GLYCOLAX) packet Take 17 g by mouth daily as needed (constipation). 14 each   . SIMBRINZA 1-0.2 % SUSP     . Triamcinolone Acetonide (KENALOG IJ) Inject as directed. Had at Dr's office in her right knee     No current facility-administered medications for this visit.    Allergies:   Milk-related compounds    Social History:  The patient  reports that she quit  smoking about 12 years ago. Her smoking use included Cigarettes. She does not have any smokeless tobacco history on file. She reports that she does not drink alcohol or use illicit drugs.   Family History:  The patient's family history includes Alcohol abuse in her brother; Cancer in her brother; Diabetes in her brother, mother, and sister; Heart attack in her father; Hypertension in her father; Peripheral vascular disease in her mother; Stroke in her mother; Varicose Veins in her brother and father.    ROS:  General:no colds or fevers, no weight changes Skin:no rashes or ulcers CV:see HPI PUL:see HPI MS:+ joint pain- shoulders, no claudication   Wt Readings from Last 3 Encounters:  11/20/14 145 lb 14.4 oz (66.18 kg)  11/06/14 148 lb (67.132 kg)  10/23/14 148 lb 11.2 oz (67.45 kg)     PHYSICAL EXAM: VS:  BP 126/72 mmHg  Pulse 64  Ht  (1.651 m)  Wt 145 lb 14.4 oz (66.18 kg)  BMI 24.28 kg/m2 , BMI Body mass index is 24.28 kg/(m^2). General:Pleasant affect, NAD Skin:Warm and dry, brisk capillary refill Heart:S1S2 RRR without murmur, gallup, rub or click Lungs:clear without rales, rhonchi, or wheezes Ext:no lower ext edema  Neuro:alert and oriented, MAE, follows commands, + facial symmetry    EKG:  EKG is NOT ordered today.    Recent Labs: 05/03/2014: Pro B Natriuretic peptide (BNP) 81.7 06/14/2014: BUN 10; Creatinine 0.68; Hemoglobin 14.9; Platelets 253; Potassium 4.6; Sodium 137 08/25/2014: ALT 8    Lipid Panel    Component Value Date/Time   CHOL 184 08/25/2014 1051   TRIG 78 08/25/2014 1051   HDL 62 08/25/2014 1051   CHOLHDL 3.0 08/25/2014 1051   VLDL 16 08/25/2014 1051   LDLCALC 106* 08/25/2014 1051       Other studies Reviewed: Additional studies/ records that were reviewed today include: as above.   ASSESSMENT AND PLAN:  1. Shoulder pain at night, no hx of arthritis there, previous silent MI and numerous risk factors for coronary disease.  Lexiscan  Myoview was ordered to evaluate and it was normal.  2 tylenol relieve her shoulder pain.  Will add voltaren cream bid and to use bio freeze prn as well.   Follow up with Dr. Erlene Quan in June of this year.  2. Hx of MI See above.  3. HTN, much improved from previous visit.     Current medicines are reviewed with the patient today.  The patient Has no concerns regarding medicines.  The following changes have been made:  See above Labs/ tests ordered today include:see above  Disposition:   FU:  see above  Signed, [name], NP  11/20/2014 2:30 PM    [facility] [address] [place], [place], [place]  [place], [place] Phone: [phone]; Fax: [phone]

## 2014-11-20 NOTE — Patient Instructions (Signed)
Please follow up with Dr. Allyson SabalBerry in June.   Restart Simvastatin 20 mg, once a day in the evening. Take this medication and Zetia 10 mg. These medicines work together.  Voltaren Gel is to be applied to your shoulder twice a day as needed.  Biofreeze can be purchased over the counter and use as directed on the product.

## 2014-12-01 ENCOUNTER — Encounter: Payer: Self-pay | Admitting: Neurology

## 2014-12-01 ENCOUNTER — Ambulatory Visit (INDEPENDENT_AMBULATORY_CARE_PROVIDER_SITE_OTHER): Payer: Medicare Other | Admitting: Neurology

## 2014-12-01 VITALS — BP 140/66 | HR 68 | Temp 97.0°F | Resp 16 | Ht 65.0 in | Wt 146.0 lb

## 2014-12-01 DIAGNOSIS — H5316 Psychophysical visual disturbances: Secondary | ICD-10-CM

## 2014-12-01 DIAGNOSIS — I63231 Cerebral infarction due to unspecified occlusion or stenosis of right carotid arteries: Secondary | ICD-10-CM

## 2014-12-01 DIAGNOSIS — I779 Disorder of arteries and arterioles, unspecified: Secondary | ICD-10-CM | POA: Diagnosis not present

## 2014-12-01 DIAGNOSIS — I739 Peripheral vascular disease, unspecified: Secondary | ICD-10-CM

## 2014-12-01 NOTE — Progress Notes (Signed)
NEUROLOGY CONSULTATION NOTE  Kaitlin Smith MRN: [id number] DOB: 02-11-1930  Referring provider: Holland Commons, NP Primary care provider: Holland Commons, NP  Reason for consult:  Assess for dementia  HISTORY OF PRESENT ILLNESS: Kaitlin Smith is an 81 year old right-handed woman with type 2 diabetes, hypertension, hyperlipidemia, peripheral vascular disease, who is blind and history of smoking, stroke and MI who presents for dementia.  Records, carotid doppler report and labs reviewed.  She had a stroke in March 2015 while living in [place], which presented with left sided weakness.  She is blind and couldn't maneuver out of the corner, causing distress and elevated blood pressure.  After the stroke, she moved down to [place] to live with her niece.  She requires some assistance with ADLs due to her blindness.  Her memory is fairly well-preserved.  She has not exhibited change in behavior or personality.  She does not exhibit episodes of sundowning.  Sometimes, she has visual hallucinations such as seeing faces or animals.  She is aware that they are not real.  She has right ICA occlusion.  She has severe 80-99% left ICA stenosis, as demonstrated on carotid doppler from 06/06/14.  She saw vascular surgery, who felt that a stent was indicated.  She decided not to have surgery due to risk of stroke related to the procedure.  LDL from 08/25/14 was 106.  Hgb A1c from 10/09/14 was 5.4.  She was advised to continue Plavix and Zetia was added to her Zocor.   PAST MEDICAL HISTORY: Past Medical History  Diagnosis Date  . Diabetes mellitus without complication   . Hypertension   . Stroke   . Myocardial infarction   . Peripheral vascular disease   . Hyperlipidemia   . Blindness   . Coronary artery disease     PAST SURGICAL HISTORY: No past surgical history on file.  MEDICATIONS: Current Outpatient Prescriptions on File Prior to Visit  Medication Sig Dispense Refill  . amLODipine (NORVASC) 10  MG tablet Take 1 tablet (10 mg total) by mouth daily. 30 tablet 3  . cloNIDine (CATAPRES) 0.2 MG tablet Take 1 tablet (0.2 mg total) by mouth daily. 30 tablet 3  . clopidogrel (PLAVIX) 75 MG tablet Take 1 tablet (75 mg total) by mouth daily. 30 tablet 3  . diclofenac sodium (VOLTAREN) 1 % GEL Apply 4 g topically 2 (two) times daily as needed. 1 Tube 3  . docusate sodium (COLACE) 100 MG capsule Take 1 capsule (100 mg total) by mouth daily. 60 capsule 3  . DUREZOL 0.05 % EMUL Place 1 tablet into the right eye 4 (four) times daily.    Marland Kitchen ezetimibe (ZETIA) 10 MG tablet Take 1 tablet (10 mg total) by mouth daily. 30 tablet 6  . hydrALAZINE (APRESOLINE) 10 MG tablet Take 1 tablet (10 mg total) by mouth 2 (two) times daily. 60 tablet 3  . insulin glargine (LANTUS) 100 UNIT/ML injection Inject 0-0.1 mLs (0-10 Units total) into the skin at bedtime. Sliding scale 10 mL 3  . LUMIGAN 0.01 % SOLN     . pantoprazole (PROTONIX) 40 MG tablet Take 1 tablet (40 mg total) by mouth daily. 30 tablet 3  . polyethylene glycol (MIRALAX / GLYCOLAX) packet Take 17 g by mouth daily as needed (constipation). 14 each   . SIMBRINZA 1-0.2 % SUSP     . simvastatin (ZOCOR) 20 MG tablet Take 1 tablet (20 mg total) by mouth at bedtime. 90 tablet 3  . Triamcinolone  Acetonide (KENALOG IJ) Inject as directed. Had at Dr's office in her right knee     No current facility-administered medications on file prior to visit.    ALLERGIES: Allergies  Allergen Reactions  . Milk-Related Compounds     Lactose intolerant.      FAMILY HISTORY: Family History  Problem Relation Age of Onset  . Diabetes Mother   . Stroke Mother   . Peripheral vascular disease Mother     amputation  . Cancer Brother   . Diabetes Brother   . Alcohol abuse Brother   . Varicose Veins Brother   . Diabetes Sister   . Hypertension Father   . Varicose Veins Father   . Heart attack Father     SOCIAL HISTORY: History   Social History  . Marital  Status: Widowed    Spouse Name: N/A  . Number of Children: N/A  . Years of Education: N/A   Occupational History  . Not on file.   Social History Main Topics  . Smoking status: Former Smoker    Types: Cigarettes    Quit date: 06/06/2002  . Smokeless tobacco: Not on file  . Alcohol Use: No  . Drug Use: No  . Sexual Activity: No   Other Topics Concern  . Not on file   Social History Narrative    REVIEW OF SYSTEMS: Constitutional: No fevers, chills, or sweats, no generalized fatigue, change in appetite Eyes: No visual changes, double vision, eye pain Ear, nose and throat: No hearing loss, ear pain, nasal congestion, sore throat Cardiovascular: No chest pain, palpitations Respiratory:  No shortness of breath at rest or with exertion, wheezes GastrointestinaI: No nausea, vomiting, diarrhea, abdominal pain, fecal incontinence Genitourinary:  No dysuria, urinary retention or frequency Musculoskeletal:  No neck pain, back pain Integumentary: No rash, pruritus, skin lesions Neurological: as above Psychiatric: No depression, insomnia, anxiety Endocrine: No palpitations, fatigue, diaphoresis, mood swings, change in appetite, change in weight, increased thirst Hematologic/Lymphatic:  No anemia, purpura, petechiae. Allergic/Immunologic: no itchy/runny eyes, nasal congestion, recent allergic reactions, rashes  PHYSICAL EXAM: Filed Vitals:   12/01/14 1333  BP: 140/66  Pulse: 68  Temp: 97 F (36.1 C)  Resp: 16   General: No acute distress Head:  Normocephalic/atraumatic Eyes:  fundi unremarkable, without vessel changes, exudates, hemorrhages or papilledema. Neck: supple, no paraspinal tenderness, full range of motion Back: No paraspinal tenderness Heart: regular rate and rhythm Lungs: Clear to auscultation bilaterally. Vascular: No carotid bruits. Neurological Exam: Mental status: alert and oriented to person and time, but not place, delayed recall poor, remote memory intact,  fund of knowledge intact, attention and concentration fair but difficulty with serial 7 subtraction, speech fluent and not dysarthric, language intact. Cranial nerves: CN I: not tested CN II: pupils equal, round and reactive to light, visual fields intact, fundi unremarkable, without vessel changes, exudates, hemorrhages or papilledema. CN III, IV, VI:  full range of motion, no nystagmus, no ptosis CN V: facial sensation intact CN VII: upper and lower face symmetric CN VIII: hearing intact CN IX, X: gag intact, uvula midline CN XI: sternocleidomastoid and trapezius muscles intact CN XII: tongue midline Bulk & Tone: normal, no fasciculations. Motor:  5/5 throughout Sensation:  Reduced pinprick in hands, vibration intact Deep Tendon Reflexes:  2+ throughout, left Babinski, right toes downward Finger to nose testing:  No dysmetria Gait:  Needs assistance ambulating due to blindness.  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  Continue Plavix 2.  Continue simvastatin and Zetia (LDL goal should be less than 70) 3.  Given that with the exception of blindness, she is a highly functional woman, I discussed the pros and cons of carotid surgery, such as the risk of large bilateral strokes versus stroke related to procedure.   4.  Follow up in 3 months.  Thank you for allowing me to take part in the care of this patient.  Shon MilletAdam Latarsha Zani, DO  CC:  Holland CommonsValerie Keck, NP

## 2014-12-01 NOTE — Patient Instructions (Signed)
I would consider the possibility of carotid surgery Continue plavix and cholesterol medication The hallucinations are related to blindness and not dementia Follow up in 3 months

## 2014-12-16 ENCOUNTER — Telehealth: Payer: Self-pay | Admitting: Emergency Medicine

## 2014-12-16 NOTE — Telephone Encounter (Signed)
Sign and fax.

## 2015-01-09 ENCOUNTER — Encounter (HOSPITAL_COMMUNITY): Payer: Self-pay

## 2015-01-09 ENCOUNTER — Emergency Department (HOSPITAL_COMMUNITY)
Admission: EM | Admit: 2015-01-09 | Discharge: 2015-01-09 | Disposition: A | Discharge status: Home / Self Care | Payer: Federal, State, Local not specified - PPO | Attending: Emergency Medicine | Admitting: Emergency Medicine

## 2015-01-09 DIAGNOSIS — E785 Hyperlipidemia, unspecified: Secondary | ICD-10-CM | POA: Insufficient documentation

## 2015-01-09 DIAGNOSIS — I252 Old myocardial infarction: Secondary | ICD-10-CM | POA: Diagnosis not present

## 2015-01-09 DIAGNOSIS — H540 Blindness, both eyes: Secondary | ICD-10-CM | POA: Insufficient documentation

## 2015-01-09 DIAGNOSIS — H547 Unspecified visual loss: Secondary | ICD-10-CM

## 2015-01-09 DIAGNOSIS — E119 Type 2 diabetes mellitus without complications: Secondary | ICD-10-CM | POA: Diagnosis not present

## 2015-01-09 DIAGNOSIS — Z79899 Other long term (current) drug therapy: Secondary | ICD-10-CM | POA: Diagnosis not present

## 2015-01-09 DIAGNOSIS — Z7902 Long term (current) use of antithrombotics/antiplatelets: Secondary | ICD-10-CM | POA: Diagnosis not present

## 2015-01-09 DIAGNOSIS — I2510 Atherosclerotic heart disease of native coronary artery without angina pectoris: Secondary | ICD-10-CM | POA: Insufficient documentation

## 2015-01-09 DIAGNOSIS — Z8673 Personal history of transient ischemic attack (TIA), and cerebral infarction without residual deficits: Secondary | ICD-10-CM | POA: Diagnosis not present

## 2015-01-09 DIAGNOSIS — Z794 Long term (current) use of insulin: Secondary | ICD-10-CM | POA: Insufficient documentation

## 2015-01-09 DIAGNOSIS — Z87891 Personal history of nicotine dependence: Secondary | ICD-10-CM | POA: Diagnosis not present

## 2015-01-09 DIAGNOSIS — I10 Essential (primary) hypertension: Secondary | ICD-10-CM

## 2015-01-09 MED ORDER — TETRACAINE HCL 0.5 % OP SOLN
2.0000 [drp] | Freq: Once | OPHTHALMIC | Status: AC
  Administered 2015-01-09: 2 [drp] via OPHTHALMIC
  Filled 2015-01-09: qty 2

## 2015-01-09 NOTE — ED Notes (Signed)
Pt presents with c/o hypertension. Pt reports she woke up with her "eyes cloudy". Pt is legally blind but can see shadows normally when her eyes are not cloudy. Pt's family member reports her blood pressure was 221/117 when it was taken at home.

## 2015-01-09 NOTE — ED Provider Notes (Signed)
CSN: [id number]     Arrival date & time 01/09/15  0221 History   First MD Initiated Contact with Patient 01/09/15 [phone]     Chief Complaint  Patient presents with  . Hypertension     (Consider location/radiation/quality/duration/timing/severity/associated sxs/prior Treatment) HPI Kaitlin Smith is a 81 y.o. female with PMH of HTN, DM, CVA, AMI, PVD, HLD, Blindness, CAD presenting today with worsening blindness. Patient states she woke up around 1:30 this morning and felt like she saw flower designs. Normally she only has vision to light and dark. She does have history of glaucoma but this has been controlled by her ophthalmologist with eyedrops. Earlier this morning patient also reports a systolic blood pressure to 20. She does take multiple high blood pressure medications. She recently had medications decreased. Currently patient states her vision is back to normal she has no complaints. She has no neurological complaints. Patient denies recent illness, she has no further complaints.  10 Systems reviewed and are negative for acute change except as noted in the HPI.    Past Medical History  Diagnosis Date  . Diabetes mellitus without complication   . Hypertension   . Stroke   . Myocardial infarction   . Peripheral vascular disease   . Hyperlipidemia   . Blindness   . Coronary artery disease    History reviewed. No pertinent past surgical history. Family History  Problem Relation Age of Onset  . Diabetes Mother   . Stroke Mother   . Peripheral vascular disease Mother     amputation  . Cancer Brother   . Diabetes Brother   . Alcohol abuse Brother   . Varicose Veins Brother   . Diabetes Sister   . Hypertension Father   . Varicose Veins Father   . Heart attack Father    History  Substance Use Topics  . Smoking status: Former Smoker    Types: Cigarettes    Quit date: 06/06/2002  . Smokeless tobacco: Not on file  . Alcohol Use: No   OB History    No data available      Review of Systems    Allergies  Milk-related compounds  Home Medications   Prior to Admission medications   Medication Sig Start Date End Date Taking? Authorizing Provider  amLODipine (NORVASC) 10 MG tablet Take 1 tablet (10 mg total) by mouth daily. 10/09/14   Ambrose FinlandValerie A Keck, NP  cloNIDine (CATAPRES) 0.2 MG tablet Take 1 tablet (0.2 mg total) by mouth daily. 10/09/14   Ambrose FinlandValerie A Keck, NP  clopidogrel (PLAVIX) 75 MG tablet Take 1 tablet (75 mg total) by mouth daily. 10/09/14   Ambrose FinlandValerie A Keck, NP  diclofenac sodium (VOLTAREN) 1 % GEL Apply 4 g topically 2 (two) times daily as needed. 11/20/14   Leone BrandLaura R Ingold, NP  docusate sodium (COLACE) 100 MG capsule Take 1 capsule (100 mg total) by mouth daily. 10/09/14   Ambrose FinlandValerie A Keck, NP  DUREZOL 0.05 % EMUL Place 1 tablet into the right eye 4 (four) times daily. 10/09/14   Historical Provider, MD  ezetimibe (ZETIA) 10 MG tablet Take 1 tablet (10 mg total) by mouth daily. 10/09/14   Ambrose FinlandValerie A Keck, NP  hydrALAZINE (APRESOLINE) 10 MG tablet Take 1 tablet (10 mg total) by mouth 2 (two) times daily. 10/09/14   Ambrose FinlandValerie A Keck, NP  insulin glargine (LANTUS) 100 UNIT/ML injection Inject 0-0.1 mLs (0-10 Units total) into the skin at bedtime. Sliding scale 10/09/14   Ambrose FinlandValerie A Keck, NP  LUMIGAN 0.01 % SOLN  08/11/14   Historical Provider, MD  pantoprazole (PROTONIX) 40 MG tablet Take 1 tablet (40 mg total) by mouth daily. 10/09/14   [name], NP  polyethylene glycol (MIRALAX / GLYCOLAX) packet Take 17 g by mouth daily as needed (constipation). 10/09/14   [name], NP  SIMBRINZA 1-0.2 % SUSP  08/11/14   Historical Provider, MD  simvastatin (ZOCOR) 20 MG tablet Take 1 tablet (20 mg total) by mouth at bedtime. 11/20/14   [name], NP  Triamcinolone Acetonide (KENALOG IJ) Inject as directed. Had at Dr's office in her right knee    Historical Provider, MD   BP 173/92 mmHg  Pulse 87  Temp(Src) 98.3 F (36.8 C) (Oral)  Resp 18  SpO2 98% Physical Exam   Constitutional: She is oriented to person, place, and time. She appears well-developed and well-nourished. No distress.  HENT:  Head: Normocephalic and atraumatic.  Nose: Nose normal.  Mouth/Throat: Oropharynx is clear and moist. No oropharyngeal exudate.  Eyes: Conjunctivae and EOM are normal. Pupils are equal, round, and reactive to light. No scleral icterus.  R pupil is nonreactive.  Visual acuity is to light only bilaterally  Neck: Normal range of motion. Neck supple. No JVD present. No tracheal deviation present. No thyromegaly present.  Cardiovascular: Normal rate, regular rhythm and normal heart sounds.  Exam reveals no gallop and no friction rub.   No murmur heard. Pulmonary/Chest: Effort normal and breath sounds normal. No respiratory distress. She has no wheezes. She exhibits no tenderness.  Abdominal: Soft. Bowel sounds are normal. She exhibits no distension and no mass. There is no tenderness. There is no rebound and no guarding.  Musculoskeletal: Normal range of motion. She exhibits no edema or tenderness.  Lymphadenopathy:    She has no cervical adenopathy.  Neurological: She is alert and oriented to person, place, and time. No cranial nerve deficit. She exhibits normal muscle tone.  Normal strength and sensation in all extremities.   Skin: Skin is warm and dry. No rash noted. No erythema. No pallor.  Nursing note and vitals reviewed.   ED Course  Procedures (including critical care time) Labs Review Labs Reviewed - No data to display  Imaging Review No results found.   EKG Interpretation None      MDM   Final diagnoses:  None   patient since emergency department for worsening vision which is now resolved on its own. Patient also had hypertension during the day. Here her systolic blood pressure is 170/90. I performed bilateral ocular pressures which were 19. This is within normal limits. Patient is advised to follow-up with her ophthalmologist during her  appointment on May 3. Primary care follow-up was also advised within 3 days for control of her blood pressure. Will obtain a repeat blood pressure prior to discharge. At this time the patient's back to her baseline, her vital signs were within her normal limits and she is safe for discharge with close follow-up.  Tomasita Crumble, MD 01/09/15 [phone]

## 2015-01-09 NOTE — ED Notes (Signed)
Patient states she woke and her eyes felt like they had designs in them. Requested a family member to check bp, SBP was 221. Patient states this happened in the past as well. Denies pain. Patient is legally blind. Patient's eyes were red per family at bedside, redness has resolved at this time.   TonoPen at bedside.

## 2015-01-09 NOTE — Discharge Instructions (Signed)
Glaucoma Kaitlin Smith, see her primary care physician within 3 days for continued management of her blood pressure. See her ophthalmologist as well for repeat evaluation of your eyes. If any symptoms worsen come back to emergency department immediately. Thank you. Glaucoma happens when the fluid pressure in the eyeball is too high. The pressure cannot stay high for too long, or the eyeball may become damaged. Signs of glaucoma include:  Having a hard time seeing in a dark room after being in a bright one.  Having trouble seeing things out to the sides of you.  Blurry sight.  Seeing bright white lights or colors in front of your eyes.  Headaches.  Feeling sick to your stomach (nauseous) or throwing up (vomiting).  Sudden vision loss. Glaucoma testing is an important part of taking care of your eyesight. HOME CARE  Always use your eyedrops or pills as told by your doctor. Do not run out.  Do not go away from home without your eyedrops or pills.  Keep your appointments.  Always tell a new doctor that you have glaucoma and how long you have had it. Tell the doctor about the eyedrops and pills you take.  Do not use eyedrops or pills that have not been prescribed by your doctor. GET HELP RIGHT AWAY IF:  You develop severe pain in the affected eye.  You develop vision problems.  You develop a bad headache in the area around the eye.  You feel sick to your stomach or throw up.  You start to have problems with the other eye. MAKE SURE YOU:   Understand these instructions.  Will watch your condition.  Will get help right away if you are not doing well or get worse. Document Released: 06/14/2008 Document Revised: 11/28/2011 Document Reviewed: 06/14/2008 [facility] Patient Information 2015 [place], [place]. This information is not intended to replace advice given to you by your health care provider. Make sure you discuss any questions you have with your health care  provider. Hypertension Hypertension is another name for high blood pressure. High blood pressure forces your heart to work harder to pump blood. A blood pressure reading has two numbers, which includes a higher number over a lower number (example: 110/72). HOME CARE   Have your blood pressure rechecked by your doctor.  Only take medicine as told by your doctor. Follow the directions carefully. The medicine does not work as well if you skip doses. Skipping doses also puts you at risk for problems.  Do not smoke.  Monitor your blood pressure at home as told by your doctor. GET HELP IF:  You think you are having a reaction to the medicine you are taking.  You have repeat headaches or feel dizzy.  You have puffiness (swelling) in your ankles.  You have trouble with your vision. GET HELP RIGHT AWAY IF:   You get a very bad headache and are confused.  You feel weak, numb, or faint.  You get chest or belly (abdominal) pain.  You throw up (vomit).  You cannot breathe very well. MAKE SURE YOU:   Understand these instructions.  Will watch your condition.  Will get help right away if you are not doing well or get worse. Document Released: 02/22/2008 Document Revised: 09/10/2013 Document Reviewed: 06/28/2013 [facility] Patient Information 2015 [place], [place]. This information is not intended to replace advice given to you by your health care provider. Make sure you discuss any questions you have with your health care provider.

## 2015-01-12 ENCOUNTER — Ambulatory Visit: Payer: Medicare Other | Admitting: Internal Medicine

## 2015-01-14 ENCOUNTER — Ambulatory Visit (INDEPENDENT_AMBULATORY_CARE_PROVIDER_SITE_OTHER): Payer: Federal, State, Local not specified - PPO | Admitting: Podiatry

## 2015-01-14 ENCOUNTER — Encounter: Payer: Self-pay | Admitting: Podiatry

## 2015-01-14 DIAGNOSIS — M79676 Pain in unspecified toe(s): Secondary | ICD-10-CM | POA: Diagnosis not present

## 2015-01-14 DIAGNOSIS — B351 Tinea unguium: Secondary | ICD-10-CM

## 2015-01-14 NOTE — Patient Instructions (Signed)
Diabetes and Foot Care Diabetes may cause you to have problems because of poor blood supply (circulation) to your feet and legs. This may cause the skin on your feet to become thinner, break easier, and heal more slowly. Your skin may become dry, and the skin may peel and crack. You may also have nerve damage in your legs and feet causing decreased feeling in them. You may not notice minor injuries to your feet that could lead to infections or more serious problems. Taking care of your feet is one of the most important things you can do for yourself.  HOME CARE INSTRUCTIONS  Wear shoes at all times, even in the house. Do not go barefoot. Bare feet are easily injured.  Check your feet daily for blisters, cuts, and redness. If you cannot see the bottom of your feet, use a mirror or ask someone for help.  Wash your feet with warm water (do not use hot water) and mild soap. Then pat your feet and the areas between your toes until they are completely dry. Do not soak your feet as this can dry your skin.  Apply a moisturizing lotion or petroleum jelly (that does not contain alcohol and is unscented) to the skin on your feet and to dry, brittle toenails. Do not apply lotion between your toes.  Trim your toenails straight across. Do not dig under them or around the cuticle. File the edges of your nails with an emery board or nail file.  Do not cut corns or calluses or try to remove them with medicine.  Wear clean socks or stockings every day. Make sure they are not too tight. Do not wear knee-high stockings since they may decrease blood flow to your legs.  Wear shoes that fit properly and have enough cushioning. To break in new shoes, wear them for just a few hours a day. This prevents you from injuring your feet. Always look in your shoes before you put them on to be sure there are no objects inside.  Do not cross your legs. This may decrease the blood flow to your feet.  If you find a minor scrape,  cut, or break in the skin on your feet, keep it and the skin around it clean and dry. These areas may be cleansed with mild soap and water. Do not cleanse the area with peroxide, alcohol, or iodine.  When you remove an adhesive bandage, be sure not to damage the skin around it.  If you have a wound, look at it several times a day to make sure it is healing.  Do not use heating pads or hot water bottles. They may burn your skin. If you have lost feeling in your feet or legs, you may not know it is happening until it is too late.  Make sure your health care provider performs a complete foot exam at least annually or more often if you have foot problems. Report any cuts, sores, or bruises to your health care provider immediately. SEEK MEDICAL CARE IF:   You have an injury that is not healing.  You have cuts or breaks in the skin.  You have an ingrown nail.  You notice redness on your legs or feet.  You feel burning or tingling in your legs or feet.  You have pain or cramps in your legs and feet.  Your legs or feet are numb.  Your feet always feel cold. SEEK IMMEDIATE MEDICAL CARE IF:   There is increasing redness,   swelling, or pain in or around a wound.  There is a red line that goes up your leg.  Pus is coming from a wound.  You develop a fever or as directed by your health care provider.  You notice a bad smell coming from an ulcer or wound. Document Released: 09/02/2000 Document Revised: 05/08/2013 Document Reviewed: 02/12/2013 ExitCare Patient Information 2015 [employer], LLC. This information is not intended to replace advice given to you by your health care provider. Make sure you discuss any questions you have with your health care provider.  

## 2015-01-14 NOTE — Progress Notes (Signed)
Patient ID: Kaitlin Smith, female   DOB: [birth year], 81 y.o.   MRN: [id number]  Subjective: This patient presents again today complaining of painful toenails and requests toenail debridement  Objective: The toenails are elongated, hypertrophic, discolored, brittle and tender to palpation 6-10  Assessment: Symptomatic onychomycoses 6-10 Type II diabetic with decreased pedal pulses  Plan: Debridement of toenails 10 without any bleeding  Reappoint 3 months

## 2015-01-16 ENCOUNTER — Ambulatory Visit: Payer: Federal, State, Local not specified - PPO | Attending: Internal Medicine | Admitting: Internal Medicine

## 2015-01-16 ENCOUNTER — Encounter: Payer: Self-pay | Admitting: Internal Medicine

## 2015-01-16 VITALS — BP 117/72 | HR 57 | Temp 98.0°F | Resp 16

## 2015-01-16 DIAGNOSIS — E119 Type 2 diabetes mellitus without complications: Secondary | ICD-10-CM

## 2015-01-16 DIAGNOSIS — Z8673 Personal history of transient ischemic attack (TIA), and cerebral infarction without residual deficits: Secondary | ICD-10-CM | POA: Insufficient documentation

## 2015-01-16 DIAGNOSIS — I10 Essential (primary) hypertension: Secondary | ICD-10-CM | POA: Insufficient documentation

## 2015-01-16 DIAGNOSIS — K5900 Constipation, unspecified: Secondary | ICD-10-CM

## 2015-01-16 DIAGNOSIS — Z87891 Personal history of nicotine dependence: Secondary | ICD-10-CM | POA: Diagnosis not present

## 2015-01-16 DIAGNOSIS — K219 Gastro-esophageal reflux disease without esophagitis: Secondary | ICD-10-CM | POA: Diagnosis not present

## 2015-01-16 LAB — GLUCOSE, POCT (MANUAL RESULT ENTRY): POC Glucose: 143 mg/dl — AB (ref 70–99)

## 2015-01-16 LAB — POCT GLYCOSYLATED HEMOGLOBIN (HGB A1C): Hemoglobin A1C: 5.3

## 2015-01-16 MED ORDER — CLOPIDOGREL BISULFATE 75 MG PO TABS: 75.0000 mg | ORAL_TABLET | Freq: Every day | ORAL | Status: DC

## 2015-01-16 MED ORDER — SIMVASTATIN 20 MG PO TABS: 20.0000 mg | ORAL_TABLET | Freq: Every day | ORAL | Status: DC

## 2015-01-16 MED ORDER — PANTOPRAZOLE SODIUM 40 MG PO TBEC: 40.0000 mg | DELAYED_RELEASE_TABLET | Freq: Every day | ORAL | Status: DC

## 2015-01-16 MED ORDER — DOCUSATE SODIUM 100 MG PO CAPS: 100.0000 mg | ORAL_CAPSULE | Freq: Every day | ORAL | Status: DC

## 2015-01-16 MED ORDER — EZETIMIBE 10 MG PO TABS: 10.0000 mg | ORAL_TABLET | Freq: Every day | ORAL | Status: DC

## 2015-01-16 MED ORDER — AMLODIPINE BESYLATE 10 MG PO TABS: 10.0000 mg | ORAL_TABLET | Freq: Every day | ORAL | Status: DC

## 2015-01-16 MED ORDER — GLUCOSE BLOOD VI STRP: ORAL_STRIP | Status: DC

## 2015-01-16 MED ORDER — HYDRALAZINE HCL 10 MG PO TABS: 10.0000 mg | ORAL_TABLET | Freq: Two times a day (BID) | ORAL | Status: DC

## 2015-01-16 MED ORDER — POLYETHYLENE GLYCOL 3350 17 GM/SCOOP PO POWD: 17.0000 g | Freq: Every day | ORAL | Status: DC

## 2015-01-16 MED ORDER — FREESTYLE LANCETS MISC: Status: DC

## 2015-01-16 MED ORDER — CLONIDINE HCL 0.2 MG PO TABS: 0.2000 mg | ORAL_TABLET | Freq: Every day | ORAL | Status: DC

## 2015-01-16 NOTE — Progress Notes (Signed)
Patient ID: Kaitlin Smith, female   DOB: 09-22-1929, 81 y.o.   MRN: [id number] 1. HTN: Medication: Norvasc, clonidine, hydralazine Home BP monitoring: does not check Positive ROS confusion after waking up in middle of night Negative ZHY:QMVHQIONROS:headache, dizziness, edema,    2. DM2:  Medication: Lantus 1-10 units as needed depending on sliding scale, Hardly needs to use at all Home CBG monitoring: 110 and less fasting  Hypoglycemic event: none Positive ROS none Negative GEX:BMWUXLKGMWROS:neuropathy, blurred vision, polyuria/dipsia  3. HLD: Medication: Simvastatin Tolerance: well Positive ROS none Negative NUU:VOZDGUYQIHKVROS:claudication, myalgias  Social History reviewed: Smoker former Exercise none, needs assistance with mobility  Physical Exam  Constitutional: She is oriented to person, place, and time.  Cardiovascular: Normal rate, regular rhythm and normal heart sounds.   Pulmonary/Chest: Effort normal and breath sounds normal.  Abdominal: Soft. Bowel sounds are normal.  Neurological: She is alert and oriented to person, place, and time.  Skin: Skin is warm and dry.  Psychiatric: She has a normal mood and affect.    Kaitlin Smith was seen today for follow-up.  Diagnoses and all orders for this visit:  Type 2 diabetes mellitus without complication Orders: -     Glucose (CBG) -     HgB A1c -     glucose blood (FREESTYLE LITE) test strip; Check 3 times per day for E11.9 -     Lancets (FREESTYLE) lancets; Check 3 times per day for E11.9 -     POCT glucose (manual entry)  Essential hypertension Orders: -     hydrALAZINE (APRESOLINE) 10 MG tablet; Take 1 tablet (10 mg total) by mouth 2 (two) times daily. -     cloNIDine (CATAPRES) 0.2 MG tablet; Take 1 tablet (0.2 mg total) by mouth daily. -     amLODipine (NORVASC) 10 MG tablet; Take 1 tablet (10 mg total) by mouth daily. Patients diabetes is well control as evidence by consistently low a1c.  Patient will continue with current therapy and continue to make  necessary lifestyle changes.  Reviewed foot care, diet, exercise, annual health maintenance with patient. She may continue sliding scale as needed.  History of CVA (cerebrovascular accident) Orders: -     simvastatin (ZOCOR) 20 MG tablet; Take 1 tablet (20 mg total) by mouth at bedtime. -     ezetimibe (ZETIA) 10 MG tablet; Take 1 tablet (10 mg total) by mouth daily. -     clopidogrel (PLAVIX) 75 MG tablet; Take 1 tablet (75 mg total) by mouth daily. Continue prophylactic treatment. Will recheck cholesterol on next office visit  Constipation, unspecified constipation type Orders: -     docusate sodium (COLACE) 100 MG capsule; Take 1 capsule (100 mg total) by mouth daily. -     polyethylene glycol powder (GLYCOLAX/MIRALAX) powder; Take 17 g by mouth daily. To use as needed. Discussed increased water intake and mobility   Gastroesophageal reflux disease, esophagitis presence not specified Orders: -    Refill pantoprazole (PROTONIX) 40 MG tablet; Take 1 tablet (40 mg total) by mouth daily.   Return in about 3 months (around 04/17/2015) for DM/HTN.  Holland CommonsKECK, Dannie Woolen, NP 02/08/2015 7:53 PM

## 2015-01-16 NOTE — Progress Notes (Signed)
Pt is here following up on her HTN, hyperlipidemia and her diabetes. Pt reports that her arthritis is getting worse. Pt states that she has 2 spots on her left side that is hard and itchy.

## 2015-02-08 ENCOUNTER — Encounter: Payer: Self-pay | Admitting: Internal Medicine

## 2015-02-25 ENCOUNTER — Encounter: Payer: Self-pay | Admitting: Cardiovascular Disease

## 2015-02-25 ENCOUNTER — Ambulatory Visit (INDEPENDENT_AMBULATORY_CARE_PROVIDER_SITE_OTHER): Payer: Medicare Other | Admitting: Cardiovascular Disease

## 2015-02-25 VITALS — BP 142/70 | HR 72 | Ht 64.0 in | Wt 143.0 lb

## 2015-02-25 DIAGNOSIS — I252 Old myocardial infarction: Secondary | ICD-10-CM | POA: Diagnosis not present

## 2015-02-25 DIAGNOSIS — I739 Peripheral vascular disease, unspecified: Secondary | ICD-10-CM | POA: Diagnosis not present

## 2015-02-25 DIAGNOSIS — I10 Essential (primary) hypertension: Secondary | ICD-10-CM | POA: Diagnosis not present

## 2015-02-25 DIAGNOSIS — E785 Hyperlipidemia, unspecified: Secondary | ICD-10-CM

## 2015-02-25 DIAGNOSIS — I63231 Cerebral infarction due to unspecified occlusion or stenosis of right carotid arteries: Secondary | ICD-10-CM

## 2015-02-25 NOTE — Patient Instructions (Signed)
Your physician wants you to follow-up in: 1 year with Dr Berry. You will receive a reminder letter in the mail two months in advance. If you don't receive a letter, please call our office to schedule the follow-up appointment.  

## 2015-02-25 NOTE — Assessment & Plan Note (Signed)
History of hyperlipidemia on Zetia and simvastatin. Her most recent lipid profile performed 08/25/14 revealed a total cholesterol 184, LDL 106 and HDL of 62

## 2015-02-25 NOTE — Assessment & Plan Note (Signed)
History of hypertension with blood pressure measured at 142/70. She is on amlodipine, clonidine and hydralazine and continue current meds at current dosing.

## 2015-02-25 NOTE — Assessment & Plan Note (Signed)
History of carotid artery disease with occlusion of right carotid and known left internal carotid artery stenosis. She has declined revascularization in the past. She is neurologically asymptomatic

## 2015-02-25 NOTE — Assessment & Plan Note (Signed)
The patient was complaining of chest pain when she was seen in the office by Nada BoozerLaura Ingold 10/23/14. A follow-up Myoview stress test was entirely normal. She has had no recurrent chest pain

## 2015-02-25 NOTE — Progress Notes (Signed)
02/25/2015 Kaitlin Smith   Nov 25, 1929  [id number]  Primary Physician Kaitlin CommonsKECK, VALERIE, NP Primary Cardiologist: Runell GessJonathan J. Kroy Sprung MD Kaitlin RenoFACP,Kaitlin Smith,Kaitlin Smith, Kaitlin Smith   HPI:  [name] is a 81 year old mildly overweight widowed African-American female with no children who lives with her niece who accompanies her today. I last saw her in the office 08/20/14. [place] She was referred by Dr. Leeanne Smith, her podiatrist, for peripheral vascular evaluation because of nonpalpable pulses. Her cardiovascular risk factor profile is notable for hyperlipidemia, diabetes and hypertension. She apparently set a stroke in March of this year with known carotid artery disease. She relates occlusion of her right carotid and known left carotid disease but she declined revascularization. She apparently also had a myocardial infarction in 2014 but was unaware of this. She gets occasional chest pain. She stopped smoking 2 years ago but was an infrequent smoker. She denies claudication. She does have symptoms compatible with diabetic peripheral neuropathy. Recent lower extremity arterial Doppler studies performed in our office 07/31/14 revealed ABIs of 0.8 bilaterally with high-grade bilateral SFA disease and severe tibial vessel disease. Her TBIs were any 0.5 range bilaterally as well. She really denies claudication but is limited by bilateral knee arthritis. She was seen in the office by Kaitlin BoozerLaura Smith in early February complaining of chest pain. Myoview stress test was entirely normal and her chest pain resolved.   Current Outpatient Prescriptions  Medication Sig Dispense Refill  . amLODipine (NORVASC) 10 MG tablet Take 1 tablet (10 mg total) by mouth daily. 30 tablet 5  . cloNIDine (CATAPRES) 0.2 MG tablet Take 1 tablet (0.2 mg total) by mouth daily. 30 tablet 5  . clopidogrel (PLAVIX) 75 MG tablet Take 1 tablet (75 mg total) by mouth daily. 30 tablet 5  . diclofenac sodium  (VOLTAREN) 1 % GEL Apply 4 g topically 2 (two) times daily as needed. 1 Tube 3  . docusate sodium (COLACE) 100 MG capsule Take 1 capsule (100 mg total) by mouth daily. 60 capsule 5  . DUREZOL 0.05 % EMUL Place 1 tablet into the right eye daily.     Marland Kitchen. ezetimibe (ZETIA) 10 MG tablet Take 1 tablet (10 mg total) by mouth daily. 30 tablet 6  . glucose blood (FREESTYLE LITE) test strip Check 3 times per day for E11.9 100 each 12  . hydrALAZINE (APRESOLINE) 10 MG tablet Take 1 tablet (10 mg total) by mouth 2 (two) times daily. 60 tablet 5  . insulin glargine (LANTUS) 100 UNIT/ML injection Inject 0-0.1 mLs (0-10 Units total) into the skin at bedtime. Sliding scale 10 mL 3  . Lancets (FREESTYLE) lancets Check 3 times per day for E11.9 100 each 12  . LUMIGAN 0.01 % SOLN     . pantoprazole (PROTONIX) 40 MG tablet Take 1 tablet (40 mg total) by mouth daily. 30 tablet 3  . polyethylene glycol (MIRALAX / GLYCOLAX) packet Take 17 g by mouth daily as needed (constipation). 14 each   . polyethylene glycol powder (GLYCOLAX/MIRALAX) powder Take 17 g by mouth daily. 850 g 4  . SIMBRINZA 1-0.2 % SUSP 2 (two) times daily.     . simvastatin (ZOCOR) 20 MG tablet Take 1 tablet (20 mg total) by mouth at bedtime. 30 tablet 5  . Triamcinolone Acetonide (KENALOG IJ) Inject as directed. Had at Dr's office in her right knee     No current facility-administered medications for this visit.    Allergies  Allergen Reactions  .  Milk-Related Compounds     Lactose intolerant.      History   Social History  . Marital Status: Widowed    Spouse Name: N/A  . Number of Children: N/A  . Years of Education: N/A   Occupational History  . Not on file.   Social History Main Topics  . Smoking status: Former Smoker    Types: Cigarettes    Quit date: 06/06/2002  . Smokeless tobacco: Not on file  . Alcohol Use: No  . Drug Use: No  . Sexual Activity: No   Other Topics Concern  . Not on file   Social History Narrative       Review of Systems: General: negative for chills, fever, night sweats or weight changes.  Cardiovascular: negative for chest pain, dyspnea on exertion, edema, orthopnea, palpitations, paroxysmal nocturnal dyspnea or shortness of breath Dermatological: negative for rash Respiratory: negative for cough or wheezing Urologic: negative for hematuria Abdominal: negative for nausea, vomiting, diarrhea, bright red blood per rectum, melena, or hematemesis Neurologic: negative for visual changes, syncope, or dizziness All other systems reviewed and are otherwise negative except as noted above.    Blood pressure 142/70, pulse 72, height  (1.626 m), weight 143 lb (64.864 kg).  General appearance: alert and no distress Neck: no adenopathy, no carotid bruit, no JVD, supple, symmetrical, trachea midline and thyroid not enlarged, symmetric, no tenderness/mass/nodules Lungs: clear to auscultation bilaterally Heart: regular rate and rhythm, S1, S2 normal, no murmur, click, rub or gallop Extremities: extremities normal, atraumatic, no cyanosis or edema  EKG not performed today  ASSESSMENT AND PLAN:   Peripheral arterial disease History of peripheral arterial disease by duplex ultrasound performed 07/31/14 with ABIs in the low 0.8 range bilaterally, high-grade mid SFA disease with 1 vessel runoff via the posterior tibial. She has no open wounds and has no claudication but is more limited by arthritis in both knees.  Occlusion and stenosis of carotid artery without mention of cerebral infarction History of carotid artery disease with occlusion of right carotid and known left internal carotid artery stenosis. She has declined revascularization in the past. She is neurologically asymptomatic  Hyperlipidemia History of hyperlipidemia on Zetia and simvastatin. Her most recent lipid profile performed 08/25/14 revealed a total cholesterol 184, LDL 106 and HDL of 62  History of MI (myocardial  infarction) The patient was complaining of chest pain when she was seen in the office by Kaitlin Smith 10/23/14. A follow-up Myoview stress test was entirely normal. She has had no recurrent chest pain  Essential hypertension History of hypertension with blood pressure measured at 142/70. She is on amlodipine, clonidine and hydralazine and continue current meds at current dosing.      Runell Gess MD Kaitlin Smith,Kaitlin Smith,Kaitlin Smith, [facility] 02/25/2015 2:47 PM

## 2015-02-25 NOTE — Assessment & Plan Note (Signed)
History of peripheral arterial disease by duplex ultrasound performed 07/31/14 with ABIs in the low 0.8 range bilaterally, high-grade mid SFA disease with 1 vessel runoff via the posterior tibial. She has no open wounds and has no claudication but is more limited by arthritis in both knees.

## 2015-02-28 IMAGING — CT CT ABD-PELV W/ CM
1 of 3 series · 13 of 32 positions shown, 18 images · IV contrast (OMNIPAQUE 300)
Comparison: 05/18/2014

CLINICAL DATA: Single episode, abdominal pain, stool incontinence

EXAM:
CT ABDOMEN AND PELVIS WITH CONTRAST
TECHNIQUE: Multidetector CT imaging of the abdomen and pelvis was performed
using the standard protocol following bolus administration of
intravenous contrast.
CONTRAST:  100mL OMNIPAQUE IOHEXOL 300 MG/ML  SOLN

[Series 2: abd/pel with · axial · 0.74mm/px · z∈[-466,-76]mm · 13 of 88 slices shown, 18 images]
[im 5/88  soft-tissue]
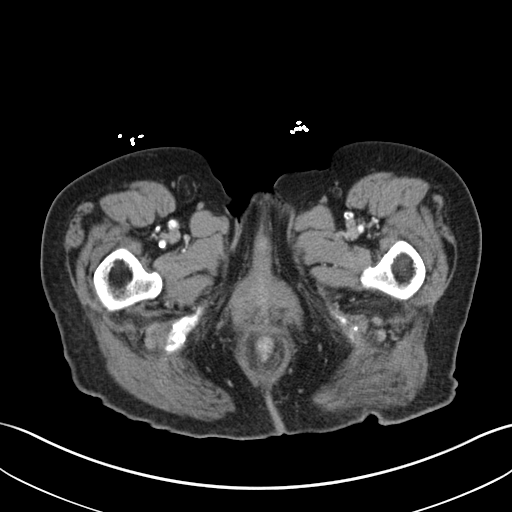
[im 5/88  bone]
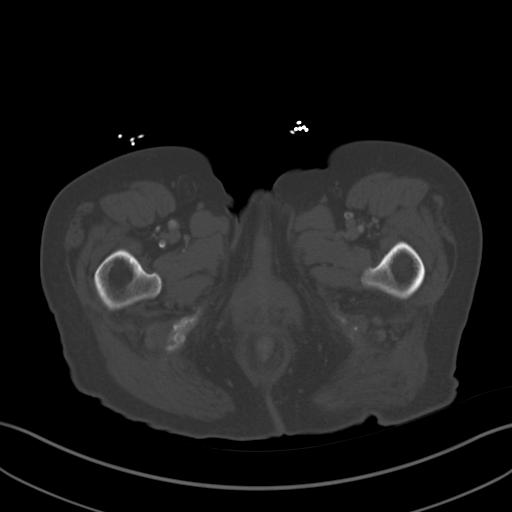
[im 14/88  soft-tissue]
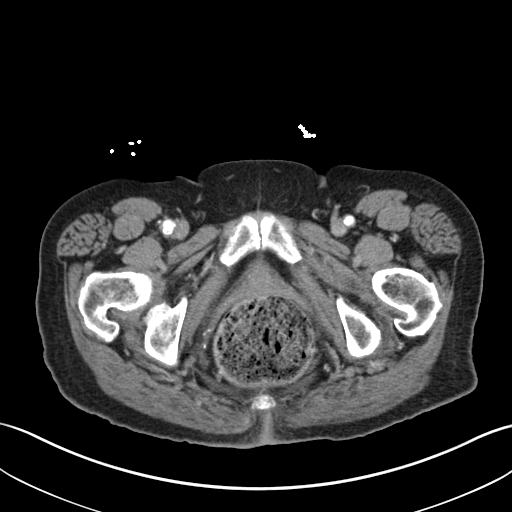
[im 19/88  soft-tissue]
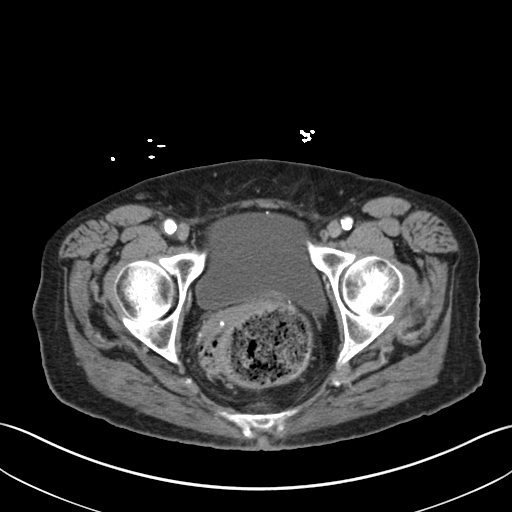
[im 28/88  soft-tissue]
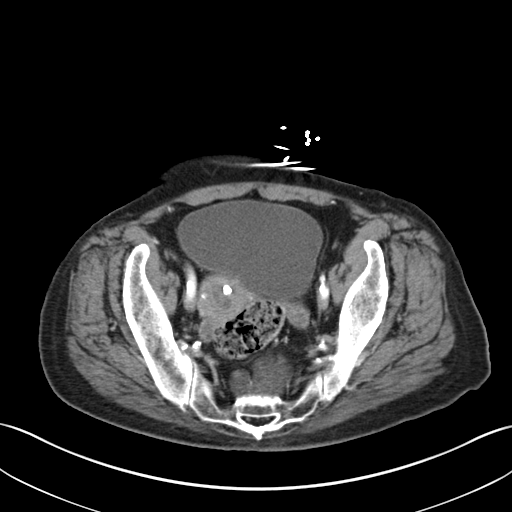
[im 33/88  soft-tissue]
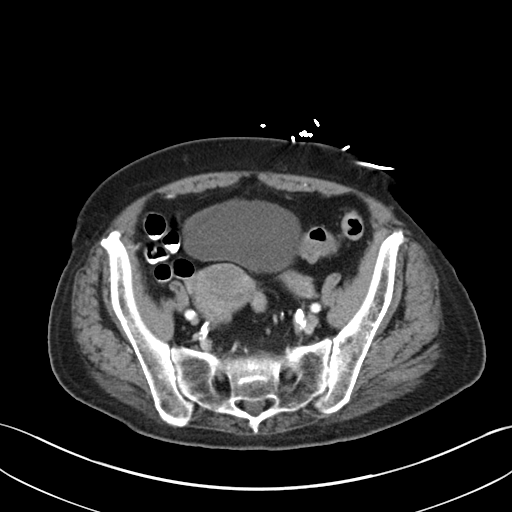
[im 42/88  soft-tissue]
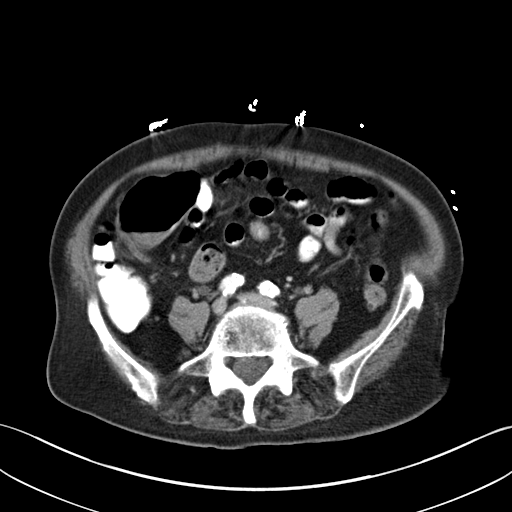
[im 46/88  soft-tissue]
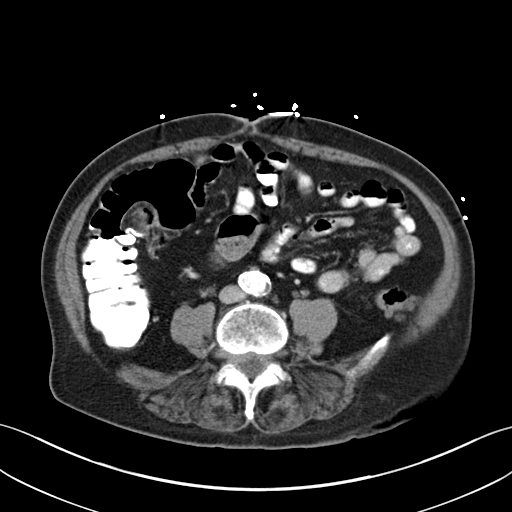
[im 55/88  soft-tissue]
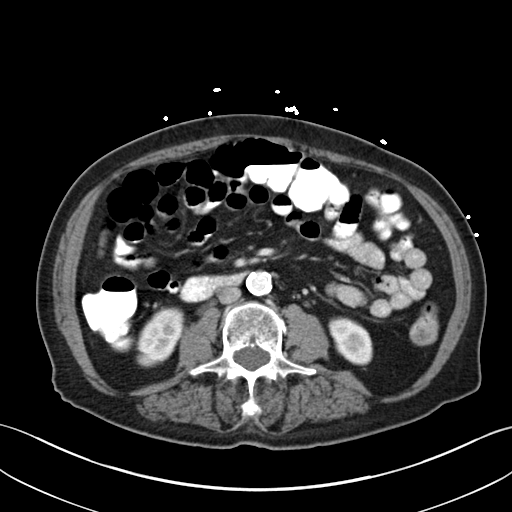
[im 60/88  soft-tissue]
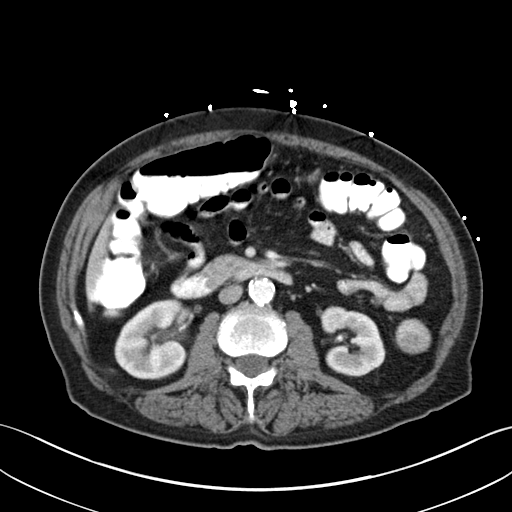
[im 60/88  bone]
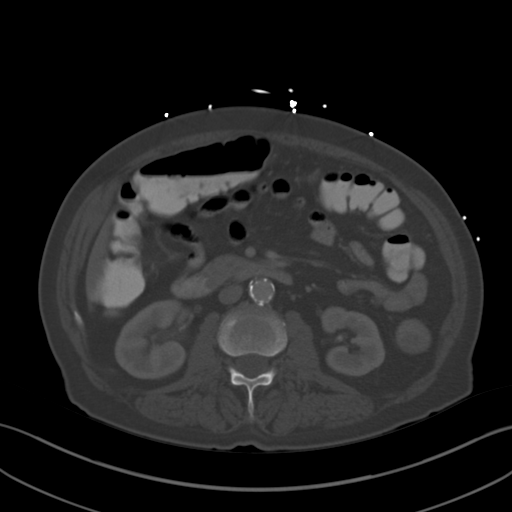
[im 69/88  soft-tissue]
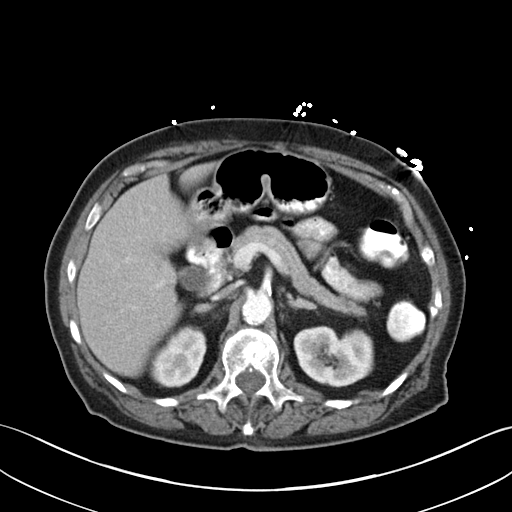
[im 69/88  lung]
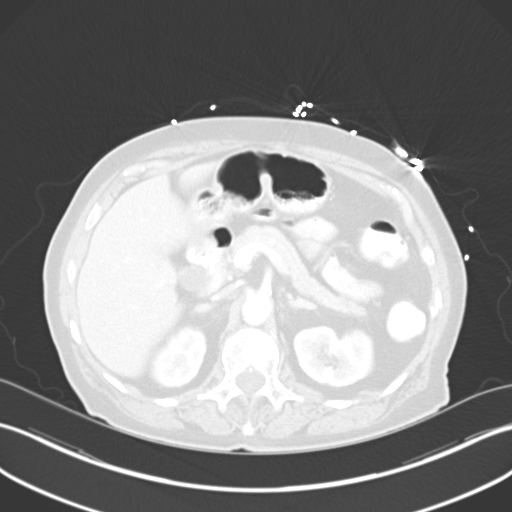
[im 74/88  soft-tissue]
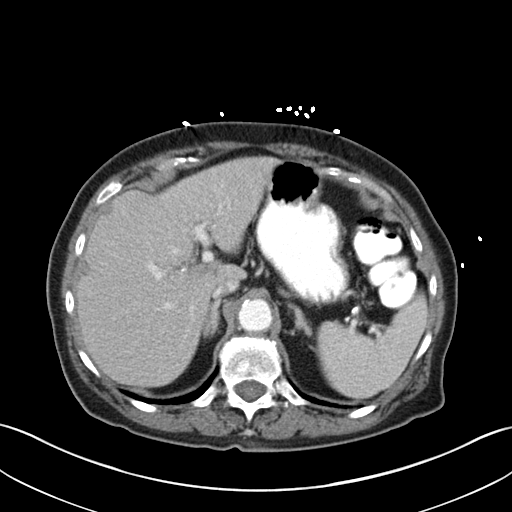
[im 74/88  lung]
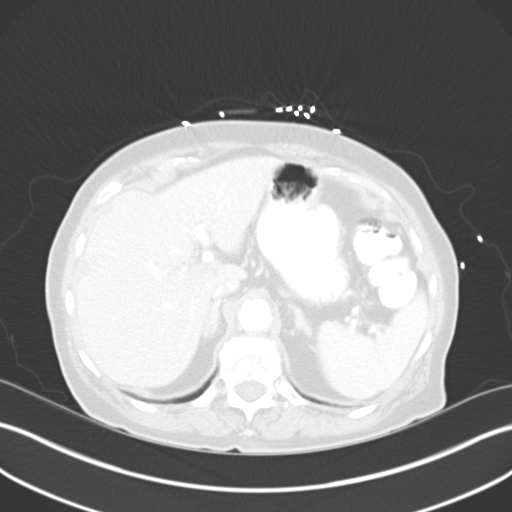
[im 78/88  lung]
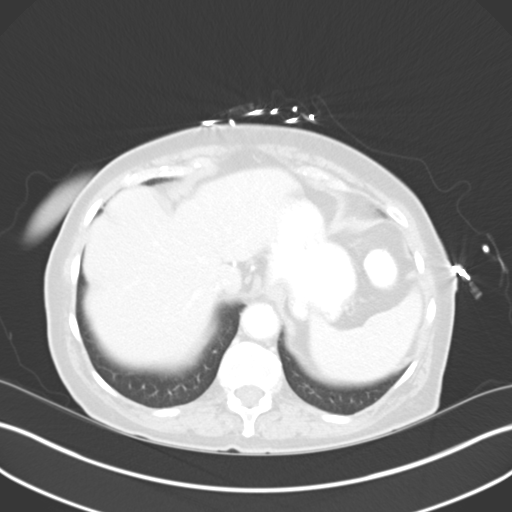
[im 83/88  soft-tissue]
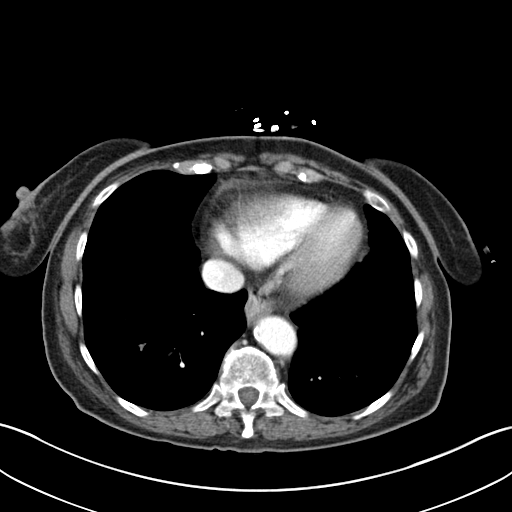
[im 83/88  lung]
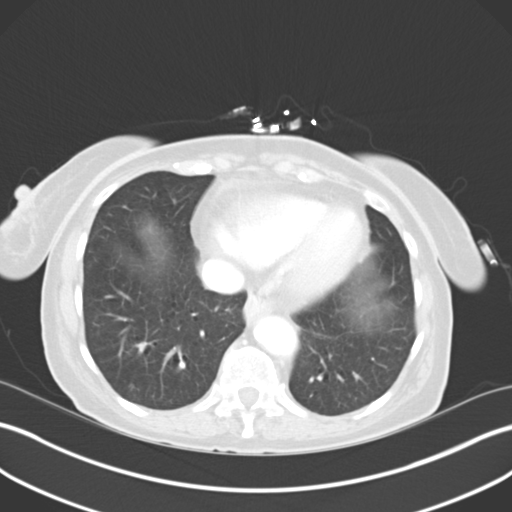

[13 of 32 positions shown; findings below may reference images not displayed]

FINDINGS: Clear lung bases. Normal heart size for age. Small hiatal hernia
noted. No pericardial or pleural effusion. Descending thoracic aorta
is tortuous and atherosclerotic.

Abdomen: Small scattered indeterminate hypodense areas in the the
left hepatic lobe, suspect small cysts largest measures 12 mm, image
13. No biliary dilatation.. Gallbladder demonstrates hyperdense
layering material suspect small gallstones. No biliary dilatation.

Pancreas, spleen, and adrenal glands within normal limits for age
and noncontrast imaging.

Kidneys demonstrate normal enhancement and excretion without
obstruction or hydronephrosis. Small scattered incidental sub cm
renal cortical cysts in the right kidney.

Calcific atherosclerosis of the aorta without significant aneurysm.
No retroperitoneal abnormality.

Negative for bowel obstruction, significant dilatation, ileus, or
free air.

No abdominal free fluid, fluid collection, hemorrhage, abscess, or
adenopathy.

Normal appendix demonstrated in the right lower quadrant.

Scattered colonic diverticulosis without acute inflammatory process.

Pelvis: No pelvic free fluid, fluid collection, hemorrhage, abscess,
adenopathy, inguinal abnormality, or hernia. Small calcified
degenerated uterine fibroids noted. No definite adnexal abnormality.
Urinary bladder unremarkable. Rectum is moderately stool filled,
suspect fecal impaction.

Extensive iliac atherosclerosis.

In the presacral space, they are well circumscribed rounded mixed
density lesions with soft tissue and fat density roughly measuring
5.6 x 3.3 cm, image 63. Smaller adjacent similar presacral nodules
are noted measuring 2.5 cm, image 69. No surrounding adenopathy or
osseous abnormality. Lesions have a nonaggressive appearance and
presacral myelolipomas are favored.
IMPRESSION: No acute intra-abdominal or pelvic finding.

Colonic diverticulosis without acute process.

Suspect cholelithiasis

Normal appendix

Stool-filled rectum, suspect fecal impaction

Mixed soft tissue and fat density presacral masses, favor adrenal
myelolipomas.

## 2015-03-17 ENCOUNTER — Ambulatory Visit: Payer: Medicare Other | Admitting: Neurology

## 2015-04-27 ENCOUNTER — Ambulatory Visit: Payer: Federal, State, Local not specified - PPO | Admitting: Podiatry

## 2015-04-28 ENCOUNTER — Ambulatory Visit (INDEPENDENT_AMBULATORY_CARE_PROVIDER_SITE_OTHER): Payer: Federal, State, Local not specified - PPO | Admitting: Podiatry

## 2015-04-28 ENCOUNTER — Encounter: Payer: Self-pay | Admitting: Podiatry

## 2015-04-28 DIAGNOSIS — M79676 Pain in unspecified toe(s): Secondary | ICD-10-CM

## 2015-04-28 DIAGNOSIS — B351 Tinea unguium: Secondary | ICD-10-CM

## 2015-04-28 NOTE — Progress Notes (Signed)
Patient ID: Kaitlin Smith, female   DOB: 1930/02/10, 81 y.o.   MRN: [id number]  Subjective: This patient presents for scheduled visit today complaining of painful toenails her request toenail debridement  Objective: The toenails are hypertrophic, discolored, incurvated, elongated and tender to direct palpation 6-10  Assessment: Symptomatic onychomycoses 6-10 Diabetic with a history of peripheral arterial disease  Plan: Debridement of toenails 10 mechanically and electrically without any bleeding  Reappoint 3 months

## 2015-04-28 NOTE — Patient Instructions (Signed)
Diabetes and Foot Care Diabetes may cause you to have problems because of poor blood supply (circulation) to your feet and legs. This may cause the skin on your feet to become thinner, break easier, and heal more slowly. Your skin may become dry, and the skin may peel and crack. You may also have nerve damage in your legs and feet causing decreased feeling in them. You may not notice minor injuries to your feet that could lead to infections or more serious problems. Taking care of your feet is one of the most important things you can do for yourself.  HOME CARE INSTRUCTIONS  Wear shoes at all times, even in the house. Do not go barefoot. Bare feet are easily injured.  Check your feet daily for blisters, cuts, and redness. If you cannot see the bottom of your feet, use a mirror or ask someone for help.  Wash your feet with warm water (do not use hot water) and mild soap. Then pat your feet and the areas between your toes until they are completely dry. Do not soak your feet as this can dry your skin.  Apply a moisturizing lotion or petroleum jelly (that does not contain alcohol and is unscented) to the skin on your feet and to dry, brittle toenails. Do not apply lotion between your toes.  Trim your toenails straight across. Do not dig under them or around the cuticle. File the edges of your nails with an emery board or nail file.  Do not cut corns or calluses or try to remove them with medicine.  Wear clean socks or stockings every day. Make sure they are not too tight. Do not wear knee-high stockings since they may decrease blood flow to your legs.  Wear shoes that fit properly and have enough cushioning. To break in new shoes, wear them for just a few hours a day. This prevents you from injuring your feet. Always look in your shoes before you put them on to be sure there are no objects inside.  Do not cross your legs. This may decrease the blood flow to your feet.  If you find a minor scrape,  cut, or break in the skin on your feet, keep it and the skin around it clean and dry. These areas may be cleansed with mild soap and water. Do not cleanse the area with peroxide, alcohol, or iodine.  When you remove an adhesive bandage, be sure not to damage the skin around it.  If you have a wound, look at it several times a day to make sure it is healing.  Do not use heating pads or hot water bottles. They may burn your skin. If you have lost feeling in your feet or legs, you may not know it is happening until it is too late.  Make sure your health care provider performs a complete foot exam at least annually or more often if you have foot problems. Report any cuts, sores, or bruises to your health care provider immediately. SEEK MEDICAL CARE IF:   You have an injury that is not healing.  You have cuts or breaks in the skin.  You have an ingrown nail.  You notice redness on your legs or feet.  You feel burning or tingling in your legs or feet.  You have pain or cramps in your legs and feet.  Your legs or feet are numb.  Your feet always feel cold. SEEK IMMEDIATE MEDICAL CARE IF:   There is increasing redness,   swelling, or pain in or around a wound.  There is a red line that goes up your leg.  Pus is coming from a wound.  You develop a fever or as directed by your health care provider.  You notice a bad smell coming from an ulcer or wound. Document Released: 09/02/2000 Document Revised: 05/08/2013 Document Reviewed: 02/12/2013 ExitCare Patient Information 2015 [employer], LLC. This information is not intended to replace advice given to you by your health care provider. Make sure you discuss any questions you have with your health care provider.  

## 2015-05-06 ENCOUNTER — Other Ambulatory Visit: Payer: Self-pay | Admitting: Internal Medicine

## 2015-05-08 NOTE — Telephone Encounter (Signed)
Kaitlin Smith from [facility] on [address] called requesting refill for protonix. After reviewing patient's chart a VO for 30 day refill was given with no refills. Patient was last seen by PCP on 01/16/15. Kaitlin Smith will notify patient to schedule 3 month f/u appt with PCP

## 2015-06-10 ENCOUNTER — Ambulatory Visit: Payer: Federal, State, Local not specified - PPO | Attending: Internal Medicine | Admitting: Internal Medicine

## 2015-06-10 ENCOUNTER — Encounter: Payer: Self-pay | Admitting: Internal Medicine

## 2015-06-10 VITALS — BP 156/78 | HR 78 | Temp 98.9°F | Resp 16 | Ht 64.0 in | Wt 148.6 lb

## 2015-06-10 DIAGNOSIS — E119 Type 2 diabetes mellitus without complications: Secondary | ICD-10-CM | POA: Diagnosis not present

## 2015-06-10 DIAGNOSIS — I10 Essential (primary) hypertension: Secondary | ICD-10-CM | POA: Insufficient documentation

## 2015-06-10 DIAGNOSIS — D649 Anemia, unspecified: Secondary | ICD-10-CM | POA: Diagnosis not present

## 2015-06-10 DIAGNOSIS — Z794 Long term (current) use of insulin: Secondary | ICD-10-CM | POA: Diagnosis not present

## 2015-06-10 DIAGNOSIS — Z8673 Personal history of transient ischemic attack (TIA), and cerebral infarction without residual deficits: Secondary | ICD-10-CM | POA: Diagnosis not present

## 2015-06-10 DIAGNOSIS — K219 Gastro-esophageal reflux disease without esophagitis: Secondary | ICD-10-CM | POA: Insufficient documentation

## 2015-06-10 DIAGNOSIS — E785 Hyperlipidemia, unspecified: Secondary | ICD-10-CM | POA: Diagnosis not present

## 2015-06-10 LAB — CBC WITH DIFFERENTIAL/PLATELET
BASOS ABS: 0 10*3/uL (ref 0.0–0.1)
Basophils Relative: 0 % (ref 0–1)
EOS PCT: 2 % (ref 0–5)
Eosinophils Absolute: 0.1 10*3/uL (ref 0.0–0.7)
HEMATOCRIT: 41.3 % (ref 36.0–46.0)
HEMOGLOBIN: 13.7 g/dL (ref 12.0–15.0)
LYMPHS ABS: 1.2 10*3/uL (ref 0.7–4.0)
LYMPHS PCT: 29 % (ref 12–46)
MCH: 31.9 pg (ref 26.0–34.0)
MCHC: 33.2 g/dL (ref 30.0–36.0)
MCV: 96 fL (ref 78.0–100.0)
MONO ABS: 0.4 10*3/uL (ref 0.1–1.0)
MPV: 9.3 fL (ref 8.6–12.4)
Monocytes Relative: 10 % (ref 3–12)
NEUTROS ABS: 2.5 10*3/uL (ref 1.7–7.7)
Neutrophils Relative %: 59 % (ref 43–77)
Platelets: 213 10*3/uL (ref 150–400)
RBC: 4.3 MIL/uL (ref 3.87–5.11)
RDW: 13.2 % (ref 11.5–15.5)
WBC: 4.2 10*3/uL (ref 4.0–10.5)

## 2015-06-10 LAB — GLUCOSE, POCT (MANUAL RESULT ENTRY): POC Glucose: 132 mg/dl — AB (ref 70–99)

## 2015-06-10 LAB — COMPLETE METABOLIC PANEL WITH GFR
ALT: 11 U/L (ref 6–29)
AST: 14 U/L (ref 10–35)
Albumin: 3.7 g/dL (ref 3.6–5.1)
Alkaline Phosphatase: 102 U/L (ref 33–130)
BUN: 13 mg/dL (ref 7–25)
CHLORIDE: 104 mmol/L (ref 98–110)
CO2: 28 mmol/L (ref 20–31)
CREATININE: 0.69 mg/dL (ref 0.60–0.88)
Calcium: 9.2 mg/dL (ref 8.6–10.4)
GFR, Est African American: >89 mL/min (ref >=60)
GFR, Est Non African American: 81 mL/min (ref >=60)
Glucose, Bld: 96 mg/dL (ref 65–99)
POTASSIUM: 4.4 mmol/L (ref 3.5–5.3)
Sodium: 139 mmol/L (ref 135–146)
Total Bilirubin: 0.5 mg/dL (ref 0.2–1.2)
Total Protein: 6.2 g/dL (ref 6.1–8.1)

## 2015-06-10 LAB — POCT GLYCOSYLATED HEMOGLOBIN (HGB A1C): HEMOGLOBIN A1C: 5.2

## 2015-06-10 MED ORDER — SIMVASTATIN 20 MG PO TABS: 20.0000 mg | ORAL_TABLET | Freq: Every day | ORAL | Status: DC

## 2015-06-10 MED ORDER — CLONIDINE HCL 0.2 MG PO TABS: 0.2000 mg | ORAL_TABLET | Freq: Every day | ORAL | Status: DC

## 2015-06-10 MED ORDER — PANTOPRAZOLE SODIUM 40 MG PO TBEC: 40.0000 mg | DELAYED_RELEASE_TABLET | Freq: Every day | ORAL | Status: DC

## 2015-06-10 MED ORDER — EZETIMIBE 10 MG PO TABS: 10.0000 mg | ORAL_TABLET | Freq: Every day | ORAL | Status: DC

## 2015-06-10 MED ORDER — CLOPIDOGREL BISULFATE 75 MG PO TABS: 75.0000 mg | ORAL_TABLET | Freq: Every day | ORAL | Status: DC

## 2015-06-10 MED ORDER — "INSULIN SYRINGE-NEEDLE U-100 31G X 15/64"" 0.5 ML MISC": Status: DC

## 2015-06-10 MED ORDER — HYDRALAZINE HCL 10 MG PO TABS: 10.0000 mg | ORAL_TABLET | Freq: Two times a day (BID) | ORAL | Status: DC

## 2015-06-10 NOTE — Progress Notes (Signed)
Patient here with family Patient here for follow up on her diabetes and HTN And for medication refill

## 2015-06-10 NOTE — Progress Notes (Signed)
Patient ID: Kaitlin Smith, female   DOB: February 27, 1930, 81 y.o.   MRN: [id number] . HTN: Medication: Norvasc, clonidine, hydralazine Home BP monitoring: BP always greater that 160 systolic upon awakening, but back down to 130's by lunch timeI have  Positive ROS confusion after waking up in middle of night.  Negative XLK:[id number], dizziness, edema ---has not been taking amlodipine, only takes when her BP is severely elevated.  2. DM2:  Medication: Lantus 1-10 units as needed depending on sliding scale, Hardly needs to use at all--maybe 3 times in the past 6 months. Home CBG monitoring: 110 and less fasting  Hypoglycemic event: none Positive ROS none Negative ZDG:[id number], blurred vision, polyuria/dipsia  3. HLD: Medication: Simvastatin Tolerance: well Positive ROS none Negative ZDG:[id number], myalgias  Social History reviewed: Smoker former Exercise none, needs assistance with mobility  Filed Vitals:   06/10/15 1220  BP: 156/78  Pulse: 78  Temp: 98.9 F (37.2 C)  Resp: 16     Physical Exam  Constitutional: She is oriented to person, place, and time.  Cardiovascular: Normal rate, regular rhythm and normal heart sounds.  Pulmonary/Chest: Effort normal and breath sounds normal.  Abdominal: Soft. Bowel sounds are normal.  Neurological: She is alert and oriented to person, place, and time.  Skin: Skin is warm and dry.  Psychiatric: She has a normal mood and affect.    Kaitlin Smith was seen today for follow-up.  Diagnoses and all orders for this visit:  Rmani was seen today for follow-up.  Diagnoses and all orders for this visit:  Type 2 diabetes mellitus without complication -     Glucose (CBG) -     HgB A1c -     Insulin Syringe-Needle U-100 (BD INSULIN SYRINGE ULTRAFINE) 31G X 15/64" 0.5 ML MISC; Take insulin once nightly as needed Patients diabetes is well control as evidence by consistently low a1c.  Patient will continue with current therapy and continue to make  necessary lifestyle changes.  Reviewed foot care, diet, exercise, annual health maintenance with patient. I have explained that patient my stop Lantus because her sugars have been very well controlled since she has not taken medication in past 3 months  Essential hypertension -     hydrALAZINE (APRESOLINE) 10 MG tablet; Take 1 tablet (10 mg total) by mouth 2 (two) times daily. -     cloNIDine (CATAPRES) 0.2 MG tablet; Take 1 tablet (0.2 mg total) by mouth daily. -     COMPLETE METABOLIC PANEL WITH GFR Patient may take amlodipine at bed time to help give her coverage overnight and in the morning.   History of CVA (cerebrovascular accident) -     simvastatin (ZOCOR) 20 MG tablet; Take 1 tablet (20 mg total) by mouth at bedtime. -     ezetimibe (ZETIA) 10 MG tablet; Take 1 tablet (10 mg total) by mouth daily. -     clopidogrel (PLAVIX) 75 MG tablet; Take 1 tablet (75 mg total) by mouth daily. Continue meds, she is stable.   Gastroesophageal reflux disease, esophagitis presence not specified -     pantoprazole (PROTONIX) 40 MG tablet; Take 1 tablet (40 mg total) by mouth daily. Discussed diet and weight with patient relating to acid reflux.  Went over things that may exacerbate acid reflux such as tomatoes, spicy foods, coffee, carbonated beverages, chocolates, etc.  Advised patient to avoid laying down at least two hours after meals and sleep with HOB elevated.   Anemia, unspecified anemia type -     CBC  with Differential Will recheck levels to make sure hemoglobin is remaining stable.    Return in about 6 months (around 12/08/2015) for DM/HTN.  [name], NP 06/11/2015 1:45 PM

## 2015-06-15 ENCOUNTER — Telehealth: Payer: Self-pay

## 2015-06-15 NOTE — Telephone Encounter (Signed)
Patient not available Left message on voice to return our call

## 2015-06-15 NOTE — Telephone Encounter (Signed)
-----   Message from [name], NP sent at 06/15/2015  1:02 PM EDT ----- Labs are within normal limits

## 2015-06-17 ENCOUNTER — Telehealth: Payer: Self-pay

## 2015-06-17 NOTE — Telephone Encounter (Signed)
Returned patient phone call Patient not available Left message on voice mail to return our call 

## 2015-06-30 ENCOUNTER — Encounter: Payer: Self-pay | Admitting: Neurology

## 2015-06-30 ENCOUNTER — Ambulatory Visit (INDEPENDENT_AMBULATORY_CARE_PROVIDER_SITE_OTHER): Payer: Federal, State, Local not specified - PPO | Admitting: Neurology

## 2015-06-30 VITALS — BP 120/60 | HR 70 | Ht 64.0 in | Wt 147.1 lb

## 2015-06-30 DIAGNOSIS — H5316 Psychophysical visual disturbances: Secondary | ICD-10-CM | POA: Diagnosis not present

## 2015-06-30 DIAGNOSIS — I779 Disorder of arteries and arterioles, unspecified: Secondary | ICD-10-CM

## 2015-06-30 DIAGNOSIS — I739 Peripheral vascular disease, unspecified: Secondary | ICD-10-CM

## 2015-06-30 DIAGNOSIS — R441 Visual hallucinations: Secondary | ICD-10-CM

## 2015-06-30 DIAGNOSIS — E785 Hyperlipidemia, unspecified: Secondary | ICD-10-CM

## 2015-06-30 DIAGNOSIS — I679 Cerebrovascular disease, unspecified: Secondary | ICD-10-CM | POA: Diagnosis not present

## 2015-06-30 DIAGNOSIS — G3184 Mild cognitive impairment, so stated: Secondary | ICD-10-CM | POA: Diagnosis not present

## 2015-06-30 NOTE — Patient Instructions (Signed)
I think you may have mild cognitive impairment, which doesn't require starting any medication.  I DO NOT think you have dementia.  As discussed before, the visual hallucinations are related to [facility] Syndrome, which can be found in some people who are blind.  Follow up as needed.

## 2015-06-30 NOTE — Progress Notes (Signed)
NEUROLOGY FOLLOW UP OFFICE NOTE  [name] [phone]  HISTORY OF PRESENT ILLNESS: Kaitlin Smith is an 81 year old right-handed woman with type 2 diabetes, hypertension, hyperlipidemia, peripheral vascular disease, who is blind and history of smoking, stroke and MI who follows up for Maureen Ralphs Syndrome and cerebrovascular disease.  Labs reviewed.  She is accompanied by her daughter who provides some history.  UPDATE: There has been no change.  She still has visual hallucinations but is not bothered by it.  Recent labs include Hgb A1c of 5.20.    HISTORY: She had a stroke in March 2015 while living in [place], which presented with left sided weakness.  She is blind and couldn't maneuver out of the corner, causing distress and elevated blood pressure.  After the stroke, she moved down to [place] to live with her niece.  She requires some assistance with ADLs due to her blindness.  Her memory is fairly well-preserved.  She has not exhibited change in behavior or personality.  She does not exhibit episodes of sundowning.  Sometimes, she has visual hallucinations such as seeing faces or animals.  She is aware that they are not real.  She has right ICA occlusion.  She has severe 80-99% left ICA stenosis, as demonstrated on carotid doppler from 06/06/14.  She saw vascular surgery, who felt that a stent was indicated.  She decided not to have surgery due to risk of stroke related to the procedure.    PAST MEDICAL HISTORY: Past Medical History  Diagnosis Date  . Diabetes mellitus without complication (HCC)   . Hypertension   . Stroke (HCC)   . Myocardial infarction (HCC)   . Peripheral vascular disease (HCC)   . Hyperlipidemia   . Blindness   . Coronary artery disease     MEDICATIONS: Current Outpatient Prescriptions on File Prior to Visit  Medication Sig Dispense Refill  . amLODipine (NORVASC) 10 MG tablet Take 1 tablet (10 mg total) by mouth daily. 30 tablet 5  . cloNIDine  (CATAPRES) 0.2 MG tablet Take 1 tablet (0.2 mg total) by mouth daily. 30 tablet 5  . clopidogrel (PLAVIX) 75 MG tablet Take 1 tablet (75 mg total) by mouth daily. 30 tablet 5  . diclofenac sodium (VOLTAREN) 1 % GEL Apply 4 g topically 2 (two) times daily as needed. 1 Tube 3  . docusate sodium (COLACE) 100 MG capsule Take 1 capsule (100 mg total) by mouth daily. 60 capsule 5  . DUREZOL 0.05 % EMUL Place 1 tablet into the right eye daily.     Marland Kitchen ezetimibe (ZETIA) 10 MG tablet Take 1 tablet (10 mg total) by mouth daily. 30 tablet 6  . glucose blood (FREESTYLE LITE) test strip Check 3 times per day for E11.9 100 each 12  . hydrALAZINE (APRESOLINE) 10 MG tablet Take 1 tablet (10 mg total) by mouth 2 (two) times daily. 60 tablet 5  . insulin glargine (LANTUS) 100 UNIT/ML injection Inject 0-0.1 mLs (0-10 Units total) into the skin at bedtime. Sliding scale 10 mL 3  . Insulin Syringe-Needle U-100 (BD INSULIN SYRINGE ULTRAFINE) 31G X 15/64" 0.5 ML MISC Take insulin once nightly as needed 90 each 3  . Lancets (FREESTYLE) lancets Check 3 times per day for E11.9 100 each 12  . LUMIGAN 0.01 % SOLN     . pantoprazole (PROTONIX) 40 MG tablet Take 1 tablet (40 mg total) by mouth daily. 30 tablet 3  . polyethylene glycol (MIRALAX / GLYCOLAX) packet  Take 17 g by mouth daily as needed (constipation). 14 each   . polyethylene glycol powder (GLYCOLAX/MIRALAX) powder Take 17 g by mouth daily. 850 g 4  . SIMBRINZA 1-0.2 % SUSP 2 (two) times daily.     . simvastatin (ZOCOR) 20 MG tablet Take 1 tablet (20 mg total) by mouth at bedtime. 30 tablet 5  . Triamcinolone Acetonide (KENALOG IJ) Inject as directed. Had at Dr's office in her right knee     No current facility-administered medications on file prior to visit.    ALLERGIES: Allergies  Allergen Reactions  . Milk-Related Compounds     Lactose intolerant.      FAMILY HISTORY: Family History  Problem Relation Age of Onset  . Diabetes Mother   . Stroke Mother    . Peripheral vascular disease Mother     amputation  . Cancer Brother   . Diabetes Brother   . Alcohol abuse Brother   . Varicose Veins Brother   . Diabetes Sister   . Hypertension Father   . Varicose Veins Father   . Heart attack Father     SOCIAL HISTORY: Social History   Social History  . Marital Status: Widowed    Spouse Name: N/A  . Number of Children: N/A  . Years of Education: N/A   Occupational History  . Not on file.   Social History Main Topics  . Smoking status: Former Smoker    Types: Cigarettes    Quit date: 06/06/2002  . Smokeless tobacco: Not on file  . Alcohol Use: No  . Drug Use: No  . Sexual Activity: No   Other Topics Concern  . Not on file   Social History Narrative    REVIEW OF SYSTEMS: Constitutional: No fevers, chills, or sweats, no generalized fatigue, change in appetite Eyes: No visual changes, double vision, eye pain Ear, nose and throat: No hearing loss, ear pain, nasal congestion, sore throat Cardiovascular: No chest pain, palpitations Respiratory:  No shortness of breath at rest or with exertion, wheezes GastrointestinaI: No nausea, vomiting, diarrhea, abdominal pain, fecal incontinence Genitourinary:  No dysuria, urinary retention or frequency Musculoskeletal:  No neck pain, back pain Integumentary: No rash, pruritus, skin lesions Neurological: as above Psychiatric: No depression, insomnia, anxiety Endocrine: No palpitations, fatigue, diaphoresis, mood swings, change in appetite, change in weight, increased thirst Hematologic/Lymphatic:  No anemia, purpura, petechiae. Allergic/Immunologic: no itchy/runny eyes, nasal congestion, recent allergic reactions, rashes  PHYSICAL EXAM: Filed Vitals:   06/30/15 1605  BP: 120/60  Pulse: 70   General: No acute distress.   Head:  Normocephalic/atraumatic Eyes:  Fundoscopic exam unremarkable without vessel changes, exudates, hemorrhages or papilledema. Neck: supple, no paraspinal  tenderness, full range of motion Heart:  Regular rate and rhythm Lungs:  Clear to auscultation bilaterally Back: No paraspinal tenderness Neurological Exam: alert and oriented to person, place, month (not year, date or day). Attention span and concentration intact, recent and remote memory intact, fund of knowledge intact.  Speech fluent and not dysarthric, language intact.  Right pupil fixed.  Left pupil minimally reactive.  Otherwise, CN II-XII intact. Fundoscopic exam unremarkable without vessel changes, exudates, hemorrhages or papilledema.  Bulk and tone normal, muscle strength 5/5 throughout.  Sensation to light touch, temperature and vibration intact.  Deep tendon reflexes absent.  Finger to nose testing intact.  Gait normal.  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  It is nothing that I would actively treat with medication. History of stroke, presumably due to right carotid artery occlusion Carotid artery disease with right carotid artery occlusion and severe left carotid artery stenosis  PLAN: Plavix Statin therapy, as managed by PCP.  LDL goal should be less than 70. Follow up as needed.  26 minutes spent face to face with patient, over 50% spent discussing diagnosis and management.  Shon Millet, DO  CC:  Holland Commons, NP

## 2015-07-28 ENCOUNTER — Ambulatory Visit: Payer: Medicare Other | Admitting: Podiatry

## 2015-08-05 ENCOUNTER — Ambulatory Visit (INDEPENDENT_AMBULATORY_CARE_PROVIDER_SITE_OTHER): Payer: Federal, State, Local not specified - PPO | Admitting: Podiatry

## 2015-08-05 ENCOUNTER — Encounter: Payer: Self-pay | Admitting: Podiatry

## 2015-08-05 DIAGNOSIS — B351 Tinea unguium: Secondary | ICD-10-CM

## 2015-08-05 DIAGNOSIS — M79676 Pain in unspecified toe(s): Secondary | ICD-10-CM

## 2015-08-05 NOTE — Patient Instructions (Signed)
Move Band-Aids on second right and second left toe in 24 hours. Apply topical antibiotic ointment to the toes daily and cover with Band-Aids until a scab forms Diabetes and Foot Care Diabetes may cause you to have problems because of poor blood supply (circulation) to your feet and legs. This may cause the skin on your feet to become thinner, break easier, and heal more slowly. Your skin may become dry, and the skin may peel and crack. You may also have nerve damage in your legs and feet causing decreased feeling in them. You may not notice minor injuries to your feet that could lead to infections or more serious problems. Taking care of your feet is one of the most important things you can do for yourself.  HOME CARE INSTRUCTIONS  Wear shoes at all times, even in the house. Do not go barefoot. Bare feet are easily injured.  Check your feet daily for blisters, cuts, and redness. If you cannot see the bottom of your feet, use a mirror or ask someone for help.  Wash your feet with warm water (do not use hot water) and mild soap. Then pat your feet and the areas between your toes until they are completely dry. Do not soak your feet as this can dry your skin.  Apply a moisturizing lotion or petroleum jelly (that does not contain alcohol and is unscented) to the skin on your feet and to dry, brittle toenails. Do not apply lotion between your toes.  Trim your toenails straight across. Do not dig under them or around the cuticle. File the edges of your nails with an emery board or nail file.  Do not cut corns or calluses or try to remove them with medicine.  Wear clean socks or stockings every day. Make sure they are not too tight. Do not wear knee-high stockings since they may decrease blood flow to your legs.  Wear shoes that fit properly and have enough cushioning. To break in new shoes, wear them for just a few hours a day. This prevents you from injuring your feet. Always look in your shoes before  you put them on to be sure there are no objects inside.  Do not cross your legs. This may decrease the blood flow to your feet.  If you find a minor scrape, cut, or break in the skin on your feet, keep it and the skin around it clean and dry. These areas may be cleansed with mild soap and water. Do not cleanse the area with peroxide, alcohol, or iodine.  When you remove an adhesive bandage, be sure not to damage the skin around it.  If you have a wound, look at it several times a day to make sure it is healing.  Do not use heating pads or hot water bottles. They may burn your skin. If you have lost feeling in your feet or legs, you may not know it is happening until it is too late.  Make sure your health care provider performs a complete foot exam at least annually or more often if you have foot problems. Report any cuts, sores, or bruises to your health care provider immediately. SEEK MEDICAL CARE IF:   You have an injury that is not healing.  You have cuts or breaks in the skin.  You have an ingrown nail.  You notice redness on your legs or feet.  You feel burning or tingling in your legs or feet.  You have pain or cramps in  your legs and feet.  Your legs or feet are numb.  Your feet always feel cold. SEEK IMMEDIATE MEDICAL CARE IF:   There is increasing redness, swelling, or pain in or around a wound.  There is a red line that goes up your leg.  Pus is coming from a wound.  You develop a fever or as directed by your health care provider.  You notice a bad smell coming from an ulcer or wound.   This information is not intended to replace advice given to you by your health care provider. Make sure you discuss any questions you have with your health care provider.   Document Released: 09/02/2000 Document Revised: 05/08/2013 Document Reviewed: 02/12/2013 Elsevier Interactive Patient Education [employer].

## 2015-08-06 NOTE — Progress Notes (Signed)
Patient ID: Kaitlin Smith, female   DOB: Feb 05, 1930, 81 y.o.   MRN: [id number]  Subjective: This patient presents for scheduled visit complaining of painful toenails and requests debridement of nails  Objective: No open skin lesions bilaterally The toenails are elongated, hypertrophic, discolored, incurvated and tender direct palpation 6-10  Assessment: Symptomatic onychomycoses 6-10 Diabetic with a history of peripheral arterial disease  Plan: Debridement toenails 10 mechanically and electrically. Slight pinpoint bleeding distal second toes bilaterally treated with topical antibiotic ointment and dressings. Patient made aware of this and advised to remove the dressings in 24 hours and apply Local antibiotic ointment to these areas daily and cover with Band-Aids until healed  Reappoint 3 months for nail debridement or sooner if patient requests

## 2015-09-08 ENCOUNTER — Telehealth: Payer: Self-pay

## 2015-09-08 ENCOUNTER — Telehealth: Payer: Self-pay | Admitting: Internal Medicine

## 2015-09-08 NOTE — Telephone Encounter (Signed)
Returned phone call to patient Call went right to voice mail Left message on machine to return our call

## 2015-09-08 NOTE — Telephone Encounter (Signed)
Pt. Needs prescription for walker with seat, wheels, and back brace....please follow up with patient

## 2015-09-10 ENCOUNTER — Telehealth: Payer: Self-pay

## 2015-09-10 DIAGNOSIS — I739 Peripheral vascular disease, unspecified: Secondary | ICD-10-CM

## 2015-09-10 NOTE — Telephone Encounter (Signed)
Pt. Is aware of prescription for her walker is in the front desk.

## 2015-09-10 NOTE — Telephone Encounter (Signed)
Patients niece called stated her aunts walker broke and she needs a new one RX printed and waiting on provider

## 2015-09-10 NOTE — Telephone Encounter (Signed)
Tried to contact patient to let her know her prescription for her walker Was ready for pick up Patient not available Message left on voice mail to return our call RX in envelope at the front desk

## 2015-09-13 ENCOUNTER — Emergency Department (HOSPITAL_COMMUNITY)
Admission: EM | Admit: 2015-09-13 | Discharge: 2015-09-14 | Disposition: A | Discharge status: Home / Self Care | Payer: Medicare Other | Attending: Emergency Medicine | Admitting: Emergency Medicine

## 2015-09-13 ENCOUNTER — Encounter (HOSPITAL_COMMUNITY): Payer: Self-pay | Admitting: Oncology

## 2015-09-13 DIAGNOSIS — Z79899 Other long term (current) drug therapy: Secondary | ICD-10-CM | POA: Insufficient documentation

## 2015-09-13 DIAGNOSIS — Z794 Long term (current) use of insulin: Secondary | ICD-10-CM | POA: Insufficient documentation

## 2015-09-13 DIAGNOSIS — H540 Blindness, both eyes: Secondary | ICD-10-CM | POA: Insufficient documentation

## 2015-09-13 DIAGNOSIS — I252 Old myocardial infarction: Secondary | ICD-10-CM | POA: Insufficient documentation

## 2015-09-13 DIAGNOSIS — N390 Urinary tract infection, site not specified: Secondary | ICD-10-CM | POA: Insufficient documentation

## 2015-09-13 DIAGNOSIS — E1165 Type 2 diabetes mellitus with hyperglycemia: Secondary | ICD-10-CM | POA: Insufficient documentation

## 2015-09-13 DIAGNOSIS — R319 Hematuria, unspecified: Secondary | ICD-10-CM

## 2015-09-13 DIAGNOSIS — Z7902 Long term (current) use of antithrombotics/antiplatelets: Secondary | ICD-10-CM | POA: Insufficient documentation

## 2015-09-13 DIAGNOSIS — E785 Hyperlipidemia, unspecified: Secondary | ICD-10-CM | POA: Insufficient documentation

## 2015-09-13 DIAGNOSIS — Z87891 Personal history of nicotine dependence: Secondary | ICD-10-CM | POA: Insufficient documentation

## 2015-09-13 DIAGNOSIS — I2510 Atherosclerotic heart disease of native coronary artery without angina pectoris: Secondary | ICD-10-CM | POA: Insufficient documentation

## 2015-09-13 DIAGNOSIS — I10 Essential (primary) hypertension: Secondary | ICD-10-CM | POA: Insufficient documentation

## 2015-09-13 DIAGNOSIS — H409 Unspecified glaucoma: Secondary | ICD-10-CM | POA: Insufficient documentation

## 2015-09-13 LAB — BASIC METABOLIC PANEL
ANION GAP: 11 (ref 5–15)
BUN: 17 mg/dL (ref 6–20)
CHLORIDE: 98 mmol/L — AB (ref 101–111)
CO2: 26 mmol/L (ref 22–32)
Calcium: 10.2 mg/dL (ref 8.9–10.3)
Creatinine, Ser: 0.89 mg/dL (ref 0.44–1.00)
GFR calc Af Amer: >60 mL/min (ref >60)
GFR, EST NON AFRICAN AMERICAN: 58 mL/min — AB (ref >60)
GLUCOSE: 404 mg/dL — AB (ref 65–99)
POTASSIUM: 3.9 mmol/L (ref 3.5–5.1)
Sodium: 135 mmol/L (ref 135–145)

## 2015-09-13 LAB — BLOOD GAS, VENOUS
Acid-base deficit: 2.2 mmol/L — ABNORMAL HIGH (ref 0.0–2.0)
Bicarbonate: 24.1 mEq/L — ABNORMAL HIGH (ref 20.0–24.0)
FIO2: 0.21
O2 SAT: 60.4 %
PATIENT TEMPERATURE: 98.8
PH VEN: 7.311 — AB (ref 7.250–7.300)
TCO2: 21.4 mmol/L (ref 0–100)
pCO2, Ven: 49.4 mmHg (ref 45.0–50.0)
pO2, Ven: 35.4 mmHg (ref 30.0–45.0)

## 2015-09-13 LAB — CBC
HEMATOCRIT: 46.2 % — AB (ref 36.0–46.0)
HEMOGLOBIN: 15.6 g/dL — AB (ref 12.0–15.0)
MCH: 31.1 pg (ref 26.0–34.0)
MCHC: 33.8 g/dL (ref 30.0–36.0)
MCV: 92 fL (ref 78.0–100.0)
Platelets: 255 10*3/uL (ref 150–400)
RBC: 5.02 MIL/uL (ref 3.87–5.11)
RDW: 12.1 % (ref 11.5–15.5)
WBC: 5.7 10*3/uL (ref 4.0–10.5)

## 2015-09-13 LAB — HEPATIC FUNCTION PANEL
ALBUMIN: 4 g/dL (ref 3.5–5.0)
ALT: 13 U/L — AB (ref 14–54)
AST: 13 U/L — AB (ref 15–41)
Alkaline Phosphatase: 124 U/L (ref 38–126)
Bilirubin, Direct: <0.1 mg/dL — ABNORMAL LOW (ref 0.1–0.5)
TOTAL PROTEIN: 7.8 g/dL (ref 6.5–8.1)
Total Bilirubin: 0.8 mg/dL (ref 0.3–1.2)

## 2015-09-13 LAB — TROPONIN I

## 2015-09-13 LAB — CBG MONITORING, ED: Glucose-Capillary: 406 mg/dL — ABNORMAL HIGH (ref 65–99)

## 2015-09-13 MED ORDER — HYDRALAZINE HCL 50 MG PO TABS
50.0000 mg | ORAL_TABLET | Freq: Once | ORAL | Status: AC
  Administered 2015-09-14: 50 mg via ORAL
  Filled 2015-09-13: qty 1

## 2015-09-13 MED ORDER — SODIUM CHLORIDE 0.9 % IV BOLUS (SEPSIS)
1000.0000 mL | Freq: Once | INTRAVENOUS | Status: AC
Start: 2015-09-13 — End: 2015-09-14
  Administered 2015-09-14: 1000 mL via INTRAVENOUS

## 2015-09-13 MED ORDER — INSULIN GLARGINE 100 UNIT/ML ~~LOC~~ SOLN
10.0000 [IU] | Freq: Once | SUBCUTANEOUS | Status: AC
  Administered 2015-09-14: 10 [IU] via SUBCUTANEOUS
  Filled 2015-09-13: qty 0.1

## 2015-09-13 NOTE — ED Notes (Signed)
Called pharmacy to request missing Lantus.  Pharm will send ASAP.

## 2015-09-13 NOTE — ED Provider Notes (Signed)
CSN: [id number]     Arrival date & time 09/13/15  2135 History   First MD Initiated Contact with Patient 09/13/15 2223     Chief Complaint  Patient presents with  . Hyperglycemia     (Consider location/radiation/quality/duration/timing/severity/associated sxs/prior Treatment) HPI The patient is here with her daughter. She reports that she has not had her insulin for approximately a month. She has also not been regularly checking her blood sugar. Today she noted to be 500. The patient's daughter reports that she does have the insulin, she reports she's not taking it because sometimes in the past her blood sugars gotten fairly low. She has been less active over the course the past week. Patient poor she's been thirsty all the time as well. Patient denies any localizing pain. She denies fever, cough. She denies any wounds or injuries to her legs or feet. She denies diarrhea or vomiting. She denies pain burning or urgency with urination. Past Medical History  Diagnosis Date  . Diabetes mellitus without complication (HCC)   . Hypertension   . Stroke (HCC)   . Myocardial infarction (HCC)   . Peripheral vascular disease (HCC)   . Hyperlipidemia   . Blindness   . Coronary artery disease    History reviewed. No pertinent past surgical history. Family History  Problem Relation Age of Onset  . Diabetes Mother   . Stroke Mother   . Peripheral vascular disease Mother     amputation  . Cancer Brother   . Diabetes Brother   . Alcohol abuse Brother   . Varicose Veins Brother   . Diabetes Sister   . Hypertension Father   . Varicose Veins Father   . Heart attack Father    Social History  Substance Use Topics  . Smoking status: Former Smoker    Types: Cigarettes    Quit date: 06/06/2002  . Smokeless tobacco: None  . Alcohol Use: No   OB History    No data available     Review of Systems 10 Systems reviewed and are negative for acute change except as noted in the  HPI.    Allergies  Milk-related compounds  Home Medications   Prior to Admission medications   Medication Sig Start Date End Date Taking? Authorizing Provider  amLODipine (NORVASC) 10 MG tablet Take 1 tablet (10 mg total) by mouth daily. 01/16/15  Yes Ambrose FinlandValerie A Keck, NP  cloNIDine (CATAPRES) 0.2 MG tablet Take 1 tablet (0.2 mg total) by mouth daily. 06/10/15  Yes Ambrose FinlandValerie A Keck, NP  clopidogrel (PLAVIX) 75 MG tablet Take 1 tablet (75 mg total) by mouth daily. 06/10/15  Yes Ambrose FinlandValerie A Keck, NP  diclofenac sodium (VOLTAREN) 1 % GEL Apply 4 g topically 2 (two) times daily as needed. Patient taking differently: Apply 4 g topically 2 (two) times daily as needed (for pain.).  11/20/14  Yes Leone BrandLaura R Ingold, NP  docusate sodium (COLACE) 100 MG capsule Take 1 capsule (100 mg total) by mouth daily. 01/16/15  Yes Ambrose FinlandValerie A Keck, NP  ezetimibe (ZETIA) 10 MG tablet Take 1 tablet (10 mg total) by mouth daily. 06/10/15  Yes Ambrose FinlandValerie A Keck, NP  glucose blood (FREESTYLE LITE) test strip Check 3 times per day for E11.9 01/16/15  Yes Ambrose FinlandValerie A Keck, NP  hydrALAZINE (APRESOLINE) 10 MG tablet Take 1 tablet (10 mg total) by mouth 2 (two) times daily. 06/10/15  Yes Ambrose FinlandValerie A Keck, NP  insulin glargine (LANTUS) 100 UNIT/ML injection Inject 0-0.1 mLs (0-10 Units total)  into the skin at bedtime. Sliding scale 10/09/14  Yes [name], NP  insulin lispro (HUMALOG) 100 UNIT/ML injection Inject 10 Units into the skin once.   Yes Historical Provider, MD  Insulin Syringe-Needle U-100 (BD INSULIN SYRINGE ULTRAFINE) 31G X 15/64" 0.5 ML MISC Take insulin once nightly as needed 06/10/15  Yes [name], NP  Lancets (FREESTYLE) lancets Check 3 times per day for E11.9 01/16/15  Yes [name], NP  LUMIGAN 0.01 % SOLN Place 1 drop into the left eye at bedtime.  08/11/14  Yes Historical Provider, MD  Multiple Vitamin (MULTIVITAMIN WITH MINERALS) TABS tablet Take 1 tablet by mouth daily. One-A-Day   Yes Historical Provider, MD   pantoprazole (PROTONIX) 40 MG tablet Take 1 tablet (40 mg total) by mouth daily. 06/10/15  Yes [name], NP  polyethylene glycol (MIRALAX / GLYCOLAX) packet Take 17 g by mouth daily as needed (constipation). 10/09/14  Yes [name], NP  SIMBRINZA 1-0.2 % SUSP Place 1 drop into the left eye at bedtime.  08/11/14  Yes Historical Provider, MD  simvastatin (ZOCOR) 20 MG tablet Take 1 tablet (20 mg total) by mouth at bedtime. 06/10/15  Yes [name], NP  Triamcinolone Acetonide (KENALOG IJ) Inject as directed. Had at Dr's office in her right knee   Yes Historical Provider, MD  polyethylene glycol powder (GLYCOLAX/MIRALAX) powder Take 17 g by mouth daily. Patient not taking: Reported on 09/13/2015 01/16/15   [name], NP   BP 158/87 mmHg  Pulse 90  Temp(Src) 98.8 F (37.1 C) (Oral)  Resp 16  Ht  (1.676 m)  Wt 146 lb (66.225 kg)  BMI 23.58 kg/m2  SpO2 99% Physical Exam  Constitutional: She is oriented to person, place, and time. She appears well-developed and well-nourished.  HENT:  Head: Normocephalic and atraumatic.  Eyes: EOM are normal. Pupils are equal, round, and reactive to light.  Neck: Neck supple.  Cardiovascular: Normal rate, regular rhythm, normal heart sounds and intact distal pulses.   Pulmonary/Chest: Effort normal and breath sounds normal.  Abdominal: Soft. Bowel sounds are normal. She exhibits no distension. There is no tenderness.  Musculoskeletal: Normal range of motion. She exhibits no edema or tenderness.  No wounds to the feet. No areas of cellulitis.  Neurological: She is alert and oriented to person, place, and time. She has normal strength. Coordination normal. GCS eye subscore is 4. GCS verbal subscore is 5. GCS motor subscore is 6.  Skin: Skin is warm, dry and intact.  Psychiatric: She has a normal mood and affect.    ED Course  Procedures (including critical care time) Labs Review Labs Reviewed  BASIC METABOLIC PANEL - Abnormal;  Notable for the following:    Chloride 98 (*)    Glucose, Bld 404 (*)    GFR calc non Af Amer 58 (*)    All other components within normal limits  CBC - Abnormal; Notable for the following:    Hemoglobin 15.6 (*)    HCT 46.2 (*)    All other components within normal limits  BLOOD GAS, VENOUS - Abnormal; Notable for the following:    pH, Ven 7.311 (*)    Bicarbonate 24.1 (*)    Acid-base deficit 2.2 (*)    All other components within normal limits  HEPATIC FUNCTION PANEL - Abnormal; Notable for the following:    AST 13 (*)    ALT 13 (*)    Bilirubin, Direct <0.1 (*)  All other components within normal limits  CBG MONITORING, ED - Abnormal; Notable for the following:    Glucose-Capillary 406 (*)    All other components within normal limits  TROPONIN I  URINALYSIS, ROUTINE W REFLEX MICROSCOPIC (NOT AT [facility])    Imaging Review No results found. I have personally reviewed and evaluated these images and lab results as part of my medical decision-making.   EKG Interpretation None      MDM   Final diagnoses:  Type 2 diabetes mellitus with hyperglycemia, unspecified long term insulin use status (HCC)   Patient presents with hyperglycemia without acidosis. She has not been taking any insulin for the past month. The patient is nontoxic. She does not have any pain complaints. She describes fatigue and polydipsia. At this time feel patient is safe to resume her insulin at 10 units in the evening with home monitoring and close follow-up with her family physician.    Arby Barrette, MD 09/14/15 [phone]

## 2015-09-13 NOTE — ED Notes (Signed)
Pt presents d/t hyperglycemia.  CBG in triage 421.  Pt was on lantus however has not taken it in over a month as she has had lower CBG's so PCP directed pt use only as needed.  Per pt's daughter pt has not been having glucose checked daily in over a month.

## 2015-09-13 NOTE — ED Notes (Signed)
Nurse drawing labs. 

## 2015-09-14 ENCOUNTER — Ambulatory Visit (INDEPENDENT_AMBULATORY_CARE_PROVIDER_SITE_OTHER): Payer: Federal, State, Local not specified - PPO | Admitting: Family Medicine

## 2015-09-14 ENCOUNTER — Encounter: Payer: Self-pay | Admitting: Family Medicine

## 2015-09-14 VITALS — BP 120/70 | HR 83 | Temp 99.2°F | Resp 17 | Ht 64.0 in | Wt 145.0 lb

## 2015-09-14 DIAGNOSIS — Z794 Long term (current) use of insulin: Secondary | ICD-10-CM

## 2015-09-14 DIAGNOSIS — IMO0001 Reserved for inherently not codable concepts without codable children: Secondary | ICD-10-CM

## 2015-09-14 DIAGNOSIS — E119 Type 2 diabetes mellitus without complications: Secondary | ICD-10-CM | POA: Diagnosis not present

## 2015-09-14 DIAGNOSIS — E1165 Type 2 diabetes mellitus with hyperglycemia: Secondary | ICD-10-CM

## 2015-09-14 LAB — URINE MICROSCOPIC-ADD ON

## 2015-09-14 LAB — URINALYSIS, ROUTINE W REFLEX MICROSCOPIC
Bilirubin Urine: NEGATIVE
KETONES UR: 15 mg/dL — AB
NITRITE: NEGATIVE
PROTEIN: NEGATIVE mg/dL
Specific Gravity, Urine: 1.028 (ref 1.005–1.030)
pH: 5 (ref 5.0–8.0)

## 2015-09-14 LAB — CBG MONITORING, ED
Glucose-Capillary: 421 mg/dL — ABNORMAL HIGH (ref 65–99)
Glucose-Capillary: 333 mg/dL — ABNORMAL HIGH (ref 65–99)

## 2015-09-14 LAB — POCT GLYCOSYLATED HEMOGLOBIN (HGB A1C): Hemoglobin A1C: 9.1

## 2015-09-14 LAB — GLUCOSE, POCT (MANUAL RESULT ENTRY)

## 2015-09-14 MED ORDER — INSULIN LISPRO 100 UNIT/ML ~~LOC~~ SOLN: 10.0000 [IU] | Freq: Three times a day (TID) | SUBCUTANEOUS | Status: DC

## 2015-09-14 MED ORDER — CEPHALEXIN 500 MG PO CAPS: 500.0000 mg | ORAL_CAPSULE | Freq: Four times a day (QID) | ORAL | Status: DC

## 2015-09-14 MED ORDER — INSULIN GLARGINE 100 UNIT/ML ~~LOC~~ SOLN: 0.0000 [IU] | Freq: Every day | SUBCUTANEOUS | Status: DC

## 2015-09-14 MED ORDER — CEPHALEXIN 500 MG PO CAPS
500.0000 mg | ORAL_CAPSULE | Freq: Once | ORAL | Status: AC
  Administered 2015-09-14: 500 mg via ORAL
  Filled 2015-09-14: qty 1

## 2015-09-14 MED ORDER — INSULIN ASPART 100 UNIT/ML ~~LOC~~ SOLN
20.0000 [IU] | Freq: Once | SUBCUTANEOUS | Status: AC
  Administered 2015-09-14: 20 [IU] via SUBCUTANEOUS

## 2015-09-14 NOTE — Progress Notes (Signed)
 @  By signing my name below, I, Kaitlin Smith, attest that this documentation has been prepared under the direction and in the presence of [name], MD.  Electronically Signed: Andrew Au, ED Scribe. 09/14/2015. 7:48 PM.  Patient ID: Kaitlin Smith MRN: [id number], DOB: May 07, 1930, 81 y.o. Date of Encounter: 09/14/2015, 7:31 PM  Primary Physician: [name], NP  Chief Complaint:  Chief Complaint  Patient presents with  . Hyperglycemia    went to ER last night for same problem & sugars were 555 around 5pm today  . Medication Refill    Out of Humalog    HPI: 81 y.o. year old female with history below presents with a medication refill. Per daughter, pt's blood sugar has been elevated. She was taken to the ED yesterday, after obtaining a blood sugar reading of 574 at home. They were able to get her sugar down into the 300's and was told to resume her Lantus 10 units. This morning her sugars were 340 and this afternoon at 500+. She was given Humalog this morning.  Daughter states pt has been sluggish, sleepy and thirsty. No cough, diarrhea, emesis  There is no record of patient receiving any insulin that I can find on her electronic medical record from last night   Past Medical History  Diagnosis Date  . Diabetes mellitus without complication (HCC)   . Hypertension   . Stroke (HCC)   . Myocardial infarction (HCC)   . Peripheral vascular disease (HCC)   . Hyperlipidemia   . Blindness   . Coronary artery disease      Home Meds: Prior to Admission medications   Medication Sig Start Date End Date Taking? Authorizing Provider  amLODipine (NORVASC) 10 MG tablet Take 1 tablet (10 mg total) by mouth daily. 01/16/15   [name], NP  cephALEXin (KEFLEX) 500 MG capsule Take 1 capsule (500 mg total) by mouth 4 (four) times daily. 09/14/15   [name], DO  cloNIDine (CATAPRES) 0.2 MG tablet Take 1 tablet (0.2 mg total) by mouth daily. 06/10/15   [name], NP   clopidogrel (PLAVIX) 75 MG tablet Take 1 tablet (75 mg total) by mouth daily. 06/10/15   [name], NP  diclofenac sodium (VOLTAREN) 1 % GEL Apply 4 g topically 2 (two) times daily as needed. Patient taking differently: Apply 4 g topically 2 (two) times daily as needed (for pain.).  11/20/14   [name], NP  docusate sodium (COLACE) 100 MG capsule Take 1 capsule (100 mg total) by mouth daily. 01/16/15   [name], NP  ezetimibe (ZETIA) 10 MG tablet Take 1 tablet (10 mg total) by mouth daily. 06/10/15   [name], NP  glucose blood (FREESTYLE LITE) test strip Check 3 times per day for E11.9 01/16/15   [name], NP  hydrALAZINE (APRESOLINE) 10 MG tablet Take 1 tablet (10 mg total) by mouth 2 (two) times daily. 06/10/15   [name], NP  insulin glargine (LANTUS) 100 UNIT/ML injection Inject 0-0.1 mLs (0-10 Units total) into the skin at bedtime. Sliding scale 10/09/14   [name], NP  insulin lispro (HUMALOG) 100 UNIT/ML injection Inject 10 Units into the skin once.    Historical Provider, MD  Insulin Syringe-Needle U-100 (BD INSULIN SYRINGE ULTRAFINE) 31G X 15/64" 0.5 ML MISC Take insulin once nightly as needed 06/10/15   [name], NP  Lancets (FREESTYLE) lancets Check 3 times per day for E11.9 01/16/15  [name], NP  LUMIGAN 0.01 % SOLN Place 1 drop into the left eye at bedtime.  08/11/14   Historical Provider, MD  Multiple Vitamin (MULTIVITAMIN WITH MINERALS) TABS tablet Take 1 tablet by mouth daily. One-A-Day    Historical Provider, MD  pantoprazole (PROTONIX) 40 MG tablet Take 1 tablet (40 mg total) by mouth daily. 06/10/15   [name], NP  polyethylene glycol (MIRALAX / GLYCOLAX) packet Take 17 g by mouth daily as needed (constipation). 10/09/14   [name], NP  polyethylene glycol powder (GLYCOLAX/MIRALAX) powder Take 17 g by mouth daily. Patient not taking: Reported on 09/13/2015 01/16/15   [name], NP  SIMBRINZA 1-0.2 % SUSP Place 1 drop  into the left eye at bedtime.  08/11/14   Historical Provider, MD  simvastatin (ZOCOR) 20 MG tablet Take 1 tablet (20 mg total) by mouth at bedtime. 06/10/15   [name], NP  Triamcinolone Acetonide (KENALOG IJ) Inject as directed. Had at Dr's office in her right knee    Historical Provider, MD    Allergies:  Allergies  Allergen Reactions  . Milk-Related Compounds     Lactose intolerant.      Social History   Social History  . Marital Status: Widowed    Spouse Name: N/A  . Number of Children: N/A  . Years of Education: N/A   Occupational History  . Not on file.   Social History Main Topics  . Smoking status: Former Smoker    Types: Cigarettes    Quit date: 06/06/2002  . Smokeless tobacco: Not on file  . Alcohol Use: No  . Drug Use: No  . Sexual Activity: No   Other Topics Concern  . Not on file   Social History Narrative     Review of Systems: Constitutional: negative for chills, fever, night sweats, weight changes, or fatigue  HEENT: negative for vision changes, hearing loss, congestion, rhinorrhea, ST, epistaxis, or sinus pressure Cardiovascular: negative for chest pain or palpitations Respiratory: negative for hemoptysis, wheezing, shortness of breath, or cough Abdominal: negative for abdominal pain, nausea, vomiting, diarrhea, or constipation Dermatological: negative for rash Neurologic: negative for headache, dizziness, or syncope All other systems reviewed and are otherwise negative with the exception to those above and in the HPI.   Physical Exam:  Smith breathing Blood pressure 120/70, pulse 83, temperature 99.2 F (37.3 C), temperature source Oral, resp. rate 17, height  (1.626 m), weight 145 lb (65.772 kg), SpO2 96 %., Body mass index is 24.88 kg/(m^2). General: Well developed, well nourished, in no acute distress. Somewhat somnolent  Head: Normocephalic, atraumatic, eyes without discharge, sclera non-icteric, nares are without discharge.  Bilateral auditory canals clear, TM's are without perforation, pearly grey and translucent with reflective cone of light bilaterally. Oral cavity moist, posterior pharynx without exudate, erythema, peritonsillar abscess, or post nasal drip.  Neck: Supple. No thyromegaly. Full ROM. No lymphadenopathy. Lungs: Clear bilaterally to auscultation without wheezes, rales, or rhonchi. Breathing is unlabored. Heart: RRR with S1 S2. No murmurs, rubs, or gallops appreciated. Abdomen: Soft, non-tender, non-distended with normoactive bowel sounds. No hepatomegaly. No rebound/guarding. No obvious abdominal masses. Msk:  Strength and tone normal for age. Extremities/Skin: Warm and dry. No clubbing or cyanosis. No edema. No rashes or suspicious lesions. Neuro: Alert and oriented X 3. Moves all extremities spontaneously. Gait is normal. CNII-XII grossly in tact. Psych: Patient responds to verbal questioning but is very somnolent.    Results for orders placed or  performed in visit on 09/14/15  POCT glucose (manual entry)  Result Value Ref Range   POC Glucose over 444 70 - 99 mg/dl  POCT glycosylated hemoglobin (Hb A1C)  Result Value Ref Range   Hemoglobin A1C 9.1      ASSESSMENT AND PLAN:  81 y.o. year old female with uncontrolled diabetes. She hasn't really improved since she was seen last night in the emergency room and was discharge without any long-term management. Family refuses to take her back because of the wait and lack of help there.  We'll give her NovoLog 20 units here, have asked the daughter to check her in 4 hours and give her another 10 units of Humalog if she hasn't dropped her blood sugar below 400. She's to increase her Lantus to 30 units daily and follow-up with her primary care doctor.  This chart was scribed in my presence and reviewed by me personally.   Signed, Elvina SidleKurt Adilee Lemme, MD 09/14/2015 7:31 PM

## 2015-09-14 NOTE — ED Provider Notes (Signed)
1:30 AM  Assumed care from Dr. Donnald GarrePfeiffer.  Pt is a 81 y.o. female with history of insulin diabetes who presents with hyperglycemia. She has not been taking her Lantus for the past month. She is not in DKA. Plan was to follow up on urinalysis. She has been told to resume her Lantus. Blood glucose has improved in the ED. She does appear to have a urinary tract infection. Culture is pending. Will discharge on Keflex. Family states that they have Lantus prescription. Have a PCP for follow-up. Discussed return precautions. Patient verbalizes understanding and is comfortable with this plan.  Kaitlin MawKristen N Abednego Yeates, DO 09/14/15 [phone]

## 2015-09-14 NOTE — Patient Instructions (Addendum)
Give Lantus 30 units daily until you can get in to see your doctor Give Humalog 10 units every 4 hours until blood sugar is below 300. Need to see her doctor in the next 2 days.  We have recommended that he go to the emergency room tonight.

## 2015-09-14 NOTE — Discharge Instructions (Signed)
Blood Glucose Monitoring, Adult Restart your Lantus at 10 units in the evening. Your first dose has been given in the emergency department. Monitoring your blood glucose (also know as blood sugar) helps you to manage your diabetes. It also helps you and your health care provider monitor your diabetes and determine how well your treatment plan is working. WHY SHOULD YOU MONITOR YOUR BLOOD GLUCOSE?  It can help you understand how food, exercise, and medicine affect your blood glucose.  It allows you to know what your blood glucose is at any given moment. You can quickly tell if you are having low blood glucose (hypoglycemia) or high blood glucose (hyperglycemia).  It can help you and your health care provider know how to adjust your medicines.  It can help you understand how to manage an illness or adjust medicine for exercise. WHEN SHOULD YOU TEST? Your health care provider will help you decide how often you should check your blood glucose. This may depend on the type of diabetes you have, your diabetes control, or the types of medicines you are taking. Be sure to write down all of your blood glucose readings so that this information can be reviewed with your health care provider. See below for examples of testing times that your health care provider may suggest. Type 1 Diabetes  Test at least 2 times per day if your diabetes is well controlled, if you are using an insulin pump, or if you perform multiple daily injections.  If your diabetes is not well controlled or if you are sick, you may need to test more often.  It is a good idea to also test:  Before every insulin injection.  Before and after exercise.  Between meals and 2 hours after a meal.  Occasionally between 2:00 a.m. and 3:00 a.m. Type 2 Diabetes  If you are taking insulin, test at least 2 times per day. However, it is best to test before every insulin injection.  If you take medicines by mouth (orally), test 2 times a  day.  If you are on a controlled diet, test once a day.  If your diabetes is not well controlled or if you are sick, you may need to monitor more often. HOW TO MONITOR YOUR BLOOD GLUCOSE Supplies Needed  Blood glucose meter.  Test strips for your meter. Each meter has its own strips. You must use the strips that go with your own meter.  A pricking needle (lancet).  A device that holds the lancet (lancing device).  A journal or log book to write down your results. Procedure  Wash your hands with soap and water. Alcohol is not preferred.  Prick the side of your finger (not the tip) with the lancet.  Gently milk the finger until a small drop of blood appears.  Follow the instructions that come with your meter for inserting the test strip, applying blood to the strip, and using your blood glucose meter. Other Areas to Get Blood for Testing Some meters allow you to use other areas of your body (other than your finger) to test your blood. These areas are called alternative sites. The most common alternative sites are:  The forearm.  The thigh.  The back area of the lower leg.  The palm of the hand. The blood flow in these areas is slower. Therefore, the blood glucose values you get may be delayed, and the numbers are different from what you would get from your fingers. Do not use alternative sites if  you think you are having hypoglycemia. Your reading will not be accurate. Always use a finger if you are having hypoglycemia. Also, if you cannot feel your lows (hypoglycemia unawareness), always use your fingers for your blood glucose checks. ADDITIONAL TIPS FOR GLUCOSE MONITORING  Do not reuse lancets.  Always carry your supplies with you.  All blood glucose meters have a 24-hour "hotline" number to call if you have questions or need help.  Adjust (calibrate) your blood glucose meter with a control solution after finishing a few boxes of strips. BLOOD GLUCOSE RECORD KEEPING It  is a good idea to keep a daily record or log of your blood glucose readings. Most glucose meters, if not all, keep your glucose records stored in the meter. Some meters come with the ability to download your records to your home computer. Keeping a record of your blood glucose readings is especially helpful if you are wanting to look for patterns. Make notes to go along with the blood glucose readings because you might forget what happened at that exact time. Keeping good records helps you and your health care provider to work together to achieve good diabetes management.    This information is not intended to replace advice given to you by your health care provider. Make sure you discuss any questions you have with your health care provider.   Document Released: 09/08/2003 Document Revised: 09/26/2014 Document Reviewed: 01/28/2013 Elsevier Interactive Patient Education 2016 [employer] Hyperglycemia Hyperglycemia occurs when the glucose (sugar) in your blood is too high. Hyperglycemia can happen for many reasons, but it most often happens to people who do not know they have diabetes or are not managing their diabetes properly.  CAUSES  Whether you have diabetes or not, there are other causes of hyperglycemia. Hyperglycemia can occur when you have diabetes, but it can also occur in other situations that you might not be as aware of, such as: Diabetes  If you have diabetes and are having problems controlling your blood glucose, hyperglycemia could occur because of some of the following reasons:  Not following your meal plan.  Not taking your diabetes medications or not taking it properly.  Exercising less or doing less activity than you normally do.  Being sick. Pre-diabetes  This cannot be ignored. Before people develop Type 2 diabetes, they almost always have "pre-diabetes." This is when your blood glucose levels are higher than normal, but not yet high enough to be diagnosed as diabetes.  Research has shown that some long-term damage to the body, especially the heart and circulatory system, may already be occurring during pre-diabetes. If you take action to manage your blood glucose when you have pre-diabetes, you may delay or prevent Type 2 diabetes from developing. Stress  If you have diabetes, you may be "diet" controlled or on oral medications or insulin to control your diabetes. However, you may find that your blood glucose is higher than usual in the hospital whether you have diabetes or not. This is often referred to as "stress hyperglycemia." Stress can elevate your blood glucose. This happens because of hormones put out by the body during times of stress. If stress has been the cause of your high blood glucose, it can be followed regularly by your caregiver. That way he/she can make sure your hyperglycemia does not continue to get worse or progress to diabetes. Steroids  Steroids are medications that act on the infection fighting system (immune system) to block inflammation or infection. One side effect can be  a rise in blood glucose. Most people can produce enough extra insulin to allow for this rise, but for those who cannot, steroids make blood glucose levels go even higher. It is not unusual for steroid treatments to "uncover" diabetes that is developing. It is not always possible to determine if the hyperglycemia will go away after the steroids are stopped. A special blood test called an A1c is sometimes done to determine if your blood glucose was elevated before the steroids were started. SYMPTOMS  Thirsty.  Frequent urination.  Dry mouth.  Blurred vision.  Tired or fatigue.  Weakness.  Sleepy.  Tingling in feet or leg. DIAGNOSIS  Diagnosis is made by monitoring blood glucose in one or all of the following ways:  A1c test. This is a chemical found in your blood.  Fingerstick blood glucose monitoring.  Laboratory results. TREATMENT  First, knowing the  cause of the hyperglycemia is important before the hyperglycemia can be treated. Treatment may include, but is not be limited to:  Education.  Change or adjustment in medications.  Change or adjustment in meal plan.  Treatment for an illness, infection, etc.  More frequent blood glucose monitoring.  Change in exercise plan.  Decreasing or stopping steroids.  Lifestyle changes. HOME CARE INSTRUCTIONS   Test your blood glucose as directed.  Exercise regularly. Your caregiver will give you instructions about exercise. Pre-diabetes or diabetes which comes on with stress is helped by exercising.  Eat wholesome, balanced meals. Eat often and at regular, fixed times. Your caregiver or nutritionist will give you a meal plan to guide your sugar intake.  Being at an ideal weight is important. If needed, losing as little as 10 to 15 pounds may help improve blood glucose levels. SEEK MEDICAL CARE IF:   You have questions about medicine, activity, or diet.  You continue to have symptoms (problems such as increased thirst, urination, or weight gain). SEEK IMMEDIATE MEDICAL CARE IF:   You are vomiting or have diarrhea.  Your breath smells fruity.  You are breathing faster or slower.  You are very sleepy or incoherent.  You have numbness, tingling, or pain in your feet or hands.  You have chest pain.  Your symptoms get worse even though you have been following your caregiver's orders.  If you have any other questions or concerns.   This information is not intended to replace advice given to you by your health care provider. Make sure you discuss any questions you have with your health care provider.   Document Released: 03/01/2001 Document Revised: 11/28/2011 Document Reviewed: 05/12/2015 Elsevier Interactive Patient Education [employer]

## 2015-09-15 LAB — URINE CULTURE: CULTURE: NO GROWTH

## 2015-09-22 ENCOUNTER — Ambulatory Visit: Payer: Federal, State, Local not specified - PPO | Admitting: Internal Medicine

## 2015-09-25 ENCOUNTER — Encounter: Payer: Self-pay | Admitting: Internal Medicine

## 2015-09-25 ENCOUNTER — Ambulatory Visit: Payer: Federal, State, Local not specified - PPO | Attending: Internal Medicine | Admitting: Internal Medicine

## 2015-09-25 VITALS — BP 174/76 | HR 78 | Temp 98.8°F | Resp 16 | Ht 65.0 in | Wt 143.0 lb

## 2015-09-25 DIAGNOSIS — Z794 Long term (current) use of insulin: Secondary | ICD-10-CM | POA: Insufficient documentation

## 2015-09-25 DIAGNOSIS — Z7902 Long term (current) use of antithrombotics/antiplatelets: Secondary | ICD-10-CM | POA: Insufficient documentation

## 2015-09-25 DIAGNOSIS — E119 Type 2 diabetes mellitus without complications: Secondary | ICD-10-CM | POA: Insufficient documentation

## 2015-09-25 DIAGNOSIS — I10 Essential (primary) hypertension: Secondary | ICD-10-CM | POA: Diagnosis not present

## 2015-09-25 DIAGNOSIS — I252 Old myocardial infarction: Secondary | ICD-10-CM | POA: Diagnosis not present

## 2015-09-25 DIAGNOSIS — Z79899 Other long term (current) drug therapy: Secondary | ICD-10-CM | POA: Insufficient documentation

## 2015-09-25 DIAGNOSIS — I739 Peripheral vascular disease, unspecified: Secondary | ICD-10-CM | POA: Diagnosis not present

## 2015-09-25 DIAGNOSIS — Z8673 Personal history of transient ischemic attack (TIA), and cerebral infarction without residual deficits: Secondary | ICD-10-CM | POA: Insufficient documentation

## 2015-09-25 DIAGNOSIS — H540 Blindness, both eyes: Secondary | ICD-10-CM | POA: Diagnosis not present

## 2015-09-25 LAB — GLUCOSE, POCT (MANUAL RESULT ENTRY): POC Glucose: 257 mg/dl — AB (ref 70–99)

## 2015-09-25 NOTE — Progress Notes (Signed)
Patient ID: Kaitlin Smith, female   DOB: 06/06/1930, 82 y.o.   MRN: [id number] SUBJECTIVE: 82 y.o. female for follow up of diabetes and hypertension. Patient has a past medical history of PAD, MI, CVA, blindness. Patient is present with niece who states that patient has been having elevated blood sugars for the past 2 weeks. Patient was taken to the ER on Christmas with a blood sugar >500 and was told to follow up with her PCP. The next day she was taken to the urgent care with repeat hyperglycemia and her Lantus was increased to 30 units nightly and told to give herself 10 units of Humalog if her sugars are above 300. Today her niece reports that since the increased Lantus her sugars are now 183 and below. She is now monitoring the patients sugar intake more carefully. Patient refuses neuropathy, polyuria, polydipsia.  Current Outpatient Prescriptions  Medication Sig Dispense Refill  . amLODipine (NORVASC) 10 MG tablet Take 1 tablet (10 mg total) by mouth daily. 30 tablet 5  . cloNIDine (CATAPRES) 0.2 MG tablet Take 1 tablet (0.2 mg total) by mouth daily. 30 tablet 5  . clopidogrel (PLAVIX) 75 MG tablet Take 1 tablet (75 mg total) by mouth daily. 30 tablet 5  . diclofenac sodium (VOLTAREN) 1 % GEL Apply 4 g topically 2 (two) times daily as needed. (Patient taking differently: Apply 4 g topically 2 (two) times daily as needed (for pain.). ) 1 Tube 3  . docusate sodium (COLACE) 100 MG capsule Take 1 capsule (100 mg total) by mouth daily. 60 capsule 5  . ezetimibe (ZETIA) 10 MG tablet Take 1 tablet (10 mg total) by mouth daily. 30 tablet 6  . hydrALAZINE (APRESOLINE) 10 MG tablet Take 1 tablet (10 mg total) by mouth 2 (two) times daily. 60 tablet 5  . insulin glargine (LANTUS) 100 UNIT/ML injection Inject 0-0.1 mLs (0-10 Units total) into the skin at bedtime. Sliding scale 10 mL 3  . LUMIGAN 0.01 % SOLN Place 1 drop into the left eye at bedtime.     . Multiple Vitamin (MULTIVITAMIN WITH MINERALS) TABS  tablet Take 1 tablet by mouth daily. One-A-Day    . pantoprazole (PROTONIX) 40 MG tablet Take 1 tablet (40 mg total) by mouth daily. 30 tablet 3  . polyethylene glycol (MIRALAX / GLYCOLAX) packet Take 17 g by mouth daily as needed (constipation). 14 each   . simvastatin (ZOCOR) 20 MG tablet Take 1 tablet (20 mg total) by mouth at bedtime. 30 tablet 5  . cephALEXin (KEFLEX) 500 MG capsule Take 1 capsule (500 mg total) by mouth 4 (four) times daily. (Patient not taking: Reported on 09/14/2015) 28 capsule 0  . glucose blood (FREESTYLE LITE) test strip Check 3 times per day for E11.9 100 each 12  . insulin lispro (HUMALOG) 100 UNIT/ML injection Inject 0.1 mLs (10 Units total) into the skin 3 (three) times daily with meals. Reported on 09/14/2015 10 mL 11  . Insulin Syringe-Needle U-100 (BD INSULIN SYRINGE ULTRAFINE) 31G X 15/64" 0.5 ML MISC Take insulin once nightly as needed 90 each 3  . Lancets (FREESTYLE) lancets Check 3 times per day for E11.9 100 each 12  . polyethylene glycol powder (GLYCOLAX/MIRALAX) powder Take 17 g by mouth daily. 850 g 4  . SIMBRINZA 1-0.2 % SUSP Place 1 drop into the left eye at bedtime.     . Triamcinolone Acetonide (KENALOG IJ) Inject as directed. Had at Dr's office in her right knee  No current facility-administered medications for this visit.    OBJECTIVE: Appearance: alert, well appearing, and in no distress, oriented to person, place, and time and normal appearing weight. BP 174/76 mmHg  Pulse 78  Temp(Src) 98.8 F (37.1 C)  Resp 16  Ht 5\' 5"  (1.651 m)  Wt 143 lb (64.864 kg)  BMI 23.80 kg/m2  SpO2 100%  Exam: heart sounds normal rate, regular rhythm, normal S1, S2, no murmurs, rubs, clicks or gallops, no edema, no abdominal tenderness or CVA tenderness  ASSESSMENT: Diabetes Mellitus: Patient will continue Lantus 30 units until we are able to wean her back down. Her A1C increased from 5.3 to 9.1%. Patient admits to eating more sugary and starchy foods  for the holiday. I have stressed that niece should monitor what patient eats and check sugars randomly before meals and after meals. Since they did not bring a log book I will have her continue current regimen and return to clinic in 2 weeks for a log review.   Return in about 2 weeks (around 10/09/2015) for Nurse Visit-log review and 3 mo PCP.   Ambrose FinlandValerie A Keck, NP 09/29/2015 12:57 PM

## 2015-09-25 NOTE — Progress Notes (Signed)
Patient here for follow up on her diabetes Was seen last week at urgent care and they increased her lantus to 30units and humalog To ten units if blood sugar greater than 300

## 2015-10-06 ENCOUNTER — Ambulatory Visit: Payer: Federal, State, Local not specified - PPO | Attending: Internal Medicine | Admitting: Pharmacist

## 2015-10-06 DIAGNOSIS — Z794 Long term (current) use of insulin: Secondary | ICD-10-CM | POA: Insufficient documentation

## 2015-10-06 DIAGNOSIS — E118 Type 2 diabetes mellitus with unspecified complications: Secondary | ICD-10-CM | POA: Diagnosis not present

## 2015-10-06 DIAGNOSIS — E119 Type 2 diabetes mellitus without complications: Secondary | ICD-10-CM

## 2015-10-06 NOTE — Progress Notes (Signed)
S:    Patient arrives in good spirits.  Presents for diabetes follow up. She arrives with her niece, who is her caregiver.  Patient reports adherence with medications. Current diabetes medications include Lantus 20-30 units daily and Humalog 3-5 units daily.  Her niece reports that she decreased the Lantus dose from 30 to 25 units after her aunt had a blood glucose reading in the 90s. Today she gave her 20 units until she was able to follow up with me.   Patient reports hypoglycemic events. She only had one and the reading was 74 so it was a very mild episode with no adverse effects.   Patient reported dietary habits: her appetite has increased  Patient reported exercise habits: no exercise   Patient reports nocturia about 1 time per night.  Patient denies neuropathy. Patient denies visual changes. Patient reports self foot exams.    O:  Lab Results  Component Value Date   HGBA1C 9.1 09/14/2015    Home fasting CBG: 74 - low 100s 2 hour post-prandial/random CBG: 100s-low 200s  A/P: Diabetes currently uncontrolled based on A1c of 9.1 but appears to be improving based on reported home readings.   Patient denies hypoglycemic events and is able to verbalize appropriate hypoglycemia management plan.  Patient reports adherence with medication. Control is suboptimal due to sedentary lifestyle.  Continued basal insulin Lantus (insulin glargine) at 25 units as patient has been taking. Continued rapid insulin Humalog (insulin lispro) to 3-5 units (depending on what patient eats and blood sugar). I do not want to change much based on the reported readings because it sounds like she is better controlled but I want to confirm that with the meter. Instructed patient's niece to monitor blood glucose and if she consistently sees readings in the 200s or <80 to come back and see me sooner than planned. Instructed her to bring the meter to the next visit so I could adjust the medications if needed. Both  the patient and her niece verbalized understanding.   Next A1C anticipated April 2017.    Written patient instructions provided.  Total time in face to face counseling 20 minutes.   Follow up in Pharmacist Clinic Visit in 1 month or sooner if needed.

## 2015-10-06 NOTE — Patient Instructions (Signed)
No changes - your blood sugars look great!  Come back and see me sooner if you see more readings in the 200s or higher or if you have multiple readings less than 80  Otherwise, check in with me in a month

## 2015-10-16 ENCOUNTER — Other Ambulatory Visit: Payer: Self-pay | Admitting: Internal Medicine

## 2015-11-10 ENCOUNTER — Encounter: Payer: Self-pay | Admitting: Podiatry

## 2015-11-10 ENCOUNTER — Ambulatory Visit (INDEPENDENT_AMBULATORY_CARE_PROVIDER_SITE_OTHER): Payer: Federal, State, Local not specified - PPO | Admitting: Podiatry

## 2015-11-10 DIAGNOSIS — B351 Tinea unguium: Secondary | ICD-10-CM | POA: Diagnosis not present

## 2015-11-10 DIAGNOSIS — M79676 Pain in unspecified toe(s): Secondary | ICD-10-CM | POA: Diagnosis not present

## 2015-11-10 NOTE — Progress Notes (Signed)
Patient ID: Kaitlin Smith, female   DOB: [birth year], 82 y.o.   MRN: [id number]  Subjective: As patient presents for scheduled visit complaining that her toenails are elongated and thickened and uncomfortable walking wearing shoes and request toenail debridement Patient's niece and grandson present in treatment room  Objective: Orientated 3 No open skin lesions bilaterally The toenails are hypertrophic, elongated, discolored, deformed and tender to direct palpation 6-10  Assessment: Symptomatic onychomycoses 6-10 Diabetic with a history of peripheral arterial disease  Plan: Debridement toenails 6-10 mechanically and electrically without any bleeding  Reappoint 3 months

## 2015-11-10 NOTE — Patient Instructions (Signed)
Diabetes and Foot Care Diabetes may cause you to have problems because of poor blood supply (circulation) to your feet and legs. This may cause the skin on your feet to become thinner, break easier, and heal more slowly. Your skin may become dry, and the skin may peel and crack. You may also have nerve damage in your legs and feet causing decreased feeling in them. You may not notice minor injuries to your feet that could lead to infections or more serious problems. Taking care of your feet is one of the most important things you can do for yourself.  HOME CARE INSTRUCTIONS  Wear shoes at all times, even in the house. Do not go barefoot. Bare feet are easily injured.  Check your feet daily for blisters, cuts, and redness. If you cannot see the bottom of your feet, use a mirror or ask someone for help.  Wash your feet with warm water (do not use hot water) and mild soap. Then pat your feet and the areas between your toes until they are completely dry. Do not soak your feet as this can dry your skin.  Apply a moisturizing lotion or petroleum jelly (that does not contain alcohol and is unscented) to the skin on your feet and to dry, brittle toenails. Do not apply lotion between your toes.  Trim your toenails straight across. Do not dig under them or around the cuticle. File the edges of your nails with an emery board or nail file.  Do not cut corns or calluses or try to remove them with medicine.  Wear clean socks or stockings every day. Make sure they are not too tight. Do not wear knee-high stockings since they may decrease blood flow to your legs.  Wear shoes that fit properly and have enough cushioning. To break in new shoes, wear them for just a few hours a day. This prevents you from injuring your feet. Always look in your shoes before you put them on to be sure there are no objects inside.  Do not cross your legs. This may decrease the blood flow to your feet.  If you find a minor scrape,  cut, or break in the skin on your feet, keep it and the skin around it clean and dry. These areas may be cleansed with mild soap and water. Do not cleanse the area with peroxide, alcohol, or iodine.  When you remove an adhesive bandage, be sure not to damage the skin around it.  If you have a wound, look at it several times a day to make sure it is healing.  Do not use heating pads or hot water bottles. They may burn your skin. If you have lost feeling in your feet or legs, you may not know it is happening until it is too late.  Make sure your health care provider performs a complete foot exam at least annually or more often if you have foot problems. Report any cuts, sores, or bruises to your health care provider immediately. SEEK MEDICAL CARE IF:   You have an injury that is not healing.  You have cuts or breaks in the skin.  You have an ingrown nail.  You notice redness on your legs or feet.  You feel burning or tingling in your legs or feet.  You have pain or cramps in your legs and feet.  Your legs or feet are numb.  Your feet always feel cold. SEEK IMMEDIATE MEDICAL CARE IF:   There is increasing redness,   swelling, or pain in or around a wound.  There is a red line that goes up your leg.  Pus is coming from a wound.  You develop a fever or as directed by your health care provider.  You notice a bad smell coming from an ulcer or wound.   This information is not intended to replace advice given to you by your health care provider. Make sure you discuss any questions you have with your health care provider.   Document Released: 09/02/2000 Document Revised: 05/08/2013 Document Reviewed: 02/12/2013 Elsevier Interactive Patient Education 2016 [employer]  

## 2015-11-12 ENCOUNTER — Encounter: Payer: Self-pay | Admitting: Internal Medicine

## 2015-11-12 ENCOUNTER — Other Ambulatory Visit: Payer: Self-pay | Admitting: *Deleted

## 2015-11-12 ENCOUNTER — Ambulatory Visit: Payer: Federal, State, Local not specified - PPO | Attending: Internal Medicine | Admitting: Internal Medicine

## 2015-11-12 VITALS — BP 147/80 | HR 75 | Temp 98.1°F | Resp 15 | Ht 65.0 in | Wt 145.8 lb

## 2015-11-12 DIAGNOSIS — I739 Peripheral vascular disease, unspecified: Secondary | ICD-10-CM | POA: Diagnosis not present

## 2015-11-12 DIAGNOSIS — Z888 Allergy status to other drugs, medicaments and biological substances status: Secondary | ICD-10-CM | POA: Insufficient documentation

## 2015-11-12 DIAGNOSIS — Z8673 Personal history of transient ischemic attack (TIA), and cerebral infarction without residual deficits: Secondary | ICD-10-CM | POA: Insufficient documentation

## 2015-11-12 DIAGNOSIS — H540 Blindness, both eyes: Secondary | ICD-10-CM | POA: Insufficient documentation

## 2015-11-12 DIAGNOSIS — I2510 Atherosclerotic heart disease of native coronary artery without angina pectoris: Secondary | ICD-10-CM | POA: Diagnosis not present

## 2015-11-12 DIAGNOSIS — E785 Hyperlipidemia, unspecified: Secondary | ICD-10-CM | POA: Diagnosis not present

## 2015-11-12 DIAGNOSIS — Z79899 Other long term (current) drug therapy: Secondary | ICD-10-CM | POA: Diagnosis not present

## 2015-11-12 DIAGNOSIS — H409 Unspecified glaucoma: Secondary | ICD-10-CM | POA: Insufficient documentation

## 2015-11-12 DIAGNOSIS — L98491 Non-pressure chronic ulcer of skin of other sites limited to breakdown of skin: Secondary | ICD-10-CM | POA: Diagnosis not present

## 2015-11-12 DIAGNOSIS — I252 Old myocardial infarction: Secondary | ICD-10-CM | POA: Insufficient documentation

## 2015-11-12 DIAGNOSIS — Z794 Long term (current) use of insulin: Secondary | ICD-10-CM | POA: Diagnosis not present

## 2015-11-12 DIAGNOSIS — L98411 Non-pressure chronic ulcer of buttock limited to breakdown of skin: Secondary | ICD-10-CM | POA: Insufficient documentation

## 2015-11-12 DIAGNOSIS — I10 Essential (primary) hypertension: Secondary | ICD-10-CM | POA: Insufficient documentation

## 2015-11-12 DIAGNOSIS — Z87891 Personal history of nicotine dependence: Secondary | ICD-10-CM | POA: Diagnosis not present

## 2015-11-12 DIAGNOSIS — E119 Type 2 diabetes mellitus without complications: Secondary | ICD-10-CM

## 2015-11-12 LAB — GLUCOSE, POCT (MANUAL RESULT ENTRY): POC Glucose: 76 mg/dl (ref 70–99)

## 2015-11-12 MED ORDER — INSULIN GLARGINE 100 UNIT/ML ~~LOC~~ SOLN: 0.0000 [IU] | Freq: Every day | SUBCUTANEOUS | Status: DC

## 2015-11-12 NOTE — Patient Instructions (Signed)
May get OTC A&D ointment

## 2015-11-12 NOTE — Progress Notes (Signed)
Patient ID: Kaitlin Smith, female   DOB: [birth year], 82 y.o.   MRN: [id number]  CC: ulcer of buttocks  HPI: Kaitlin Smith is a 82 y.o. female here today for a follow up visit.  Patient has past medical history of diabetes, blindness, Stroke, HTN, MI, HLD, CAD, and glaucoma. Patient is present with her niece who is her primary caregiver. Patient's niece reports that she was cleaning the patient and noticed a small ulcer in between the patients buttocks several days ago. Patient is not incontinent. She states that sometimes at night she places a pad on the patient to keep her dry and she is unsure if that caused skin irritation. Area is not painful to patient. Patient has no problems with mobility or shifting weight in bed.  Allergies  Allergen Reactions  . Milk-Related Compounds Other (See Comments)    Lactose intolerant.     Past Medical History  Diagnosis Date  . Diabetes mellitus without complication (HCC)   . Hypertension   . Stroke (HCC)   . Myocardial infarction (HCC)   . Peripheral vascular disease (HCC)   . Hyperlipidemia   . Blindness   . Coronary artery disease   . Allergy   . Arthritis   . Glaucoma    Current Outpatient Prescriptions on File Prior to Visit  Medication Sig Dispense Refill  . amLODipine (NORVASC) 10 MG tablet Take 1 tablet (10 mg total) by mouth daily. 30 tablet 5  . cloNIDine (CATAPRES) 0.2 MG tablet Take 1 tablet (0.2 mg total) by mouth daily. 30 tablet 5  . clopidogrel (PLAVIX) 75 MG tablet Take 1 tablet (75 mg total) by mouth daily. 30 tablet 5  . diclofenac sodium (VOLTAREN) 1 % GEL Apply 4 g topically 2 (two) times daily as needed. (Patient taking differently: Apply 4 g topically 2 (two) times daily as needed (for pain.). ) 1 Tube 3  . docusate sodium (COLACE) 100 MG capsule Take 1 capsule (100 mg total) by mouth daily. 60 capsule 5  . ezetimibe (ZETIA) 10 MG tablet Take 1 tablet (10 mg total) by mouth daily. 30 tablet 6  . glucose blood (FREESTYLE LITE)  test strip Check 3 times per day for E11.9 100 each 12  . hydrALAZINE (APRESOLINE) 10 MG tablet Take 1 tablet (10 mg total) by mouth 2 (two) times daily. 60 tablet 5  . insulin glargine (LANTUS) 100 UNIT/ML injection Inject 0-0.1 mLs (0-10 Units total) into the skin at bedtime. Sliding scale 10 mL 3  . insulin lispro (HUMALOG) 100 UNIT/ML injection Inject 0.1 mLs (10 Units total) into the skin 3 (three) times daily with meals. Reported on 09/14/2015 10 mL 11  . Insulin Syringe-Needle U-100 (BD INSULIN SYRINGE ULTRAFINE) 31G X 15/64" 0.5 ML MISC Take insulin once nightly as needed 90 each 3  . Lancets (FREESTYLE) lancets Check 3 times per day for E11.9 100 each 12  . LUMIGAN 0.01 % SOLN Place 1 drop into the left eye at bedtime.     . Multiple Vitamin (MULTIVITAMIN WITH MINERALS) TABS tablet Take 1 tablet by mouth daily. One-A-Day    . pantoprazole (PROTONIX) 40 MG tablet TAKE 1 TABLET BY MOUTH EVERY DAY 30 tablet 2  . polyethylene glycol powder (GLYCOLAX/MIRALAX) powder Take 17 g by mouth daily. 850 g 4  . SIMBRINZA 1-0.2 % SUSP Place 1 drop into the left eye at bedtime.     . simvastatin (ZOCOR) 20 MG tablet Take 1 tablet (20 mg total) by mouth at bedtime.  30 tablet 5  . Triamcinolone Acetonide (KENALOG IJ) Inject as directed. Had at Dr's office in her right knee     No current facility-administered medications on file prior to visit.   Family History  Problem Relation Age of Onset  . Diabetes Mother   . Stroke Mother   . Peripheral vascular disease Mother     amputation  . Cancer Brother   . Diabetes Brother   . Alcohol abuse Brother   . Varicose Veins Brother   . Diabetes Sister   . Hypertension Sister   . Hypertension Father   . Varicose Veins Father   . Heart attack Father   . Cancer Father    Social History   Social History  . Marital Status: Widowed    Spouse Name: N/A  . Number of Children: N/A  . Years of Education: N/A   Occupational History  . Not on file.    Social History Main Topics  . Smoking status: Former Smoker    Types: Cigarettes    Quit date: 06/06/2002  . Smokeless tobacco: Not on file     Comment: Quit at age 89  . Alcohol Use: No  . Drug Use: No  . Sexual Activity: No   Other Topics Concern  . Not on file   Social History Narrative    Review of Systems: Other than what is stated in HPI, all other systems are negative.   Objective:   Filed Vitals:   11/12/15 1424  BP: 182/79  Pulse: 75  Temp: 98.1 F (36.7 C)  Resp: 15    Physical Exam  Skin: Skin is warm and dry. There is erythema.     Small dime sized open skin ulceration.      Lab Results  Component Value Date   WBC 5.7 09/13/2015   HGB 15.6* 09/13/2015   HCT 46.2* 09/13/2015   MCV 92.0 09/13/2015   PLT 255 09/13/2015   Lab Results  Component Value Date   CREATININE 0.89 09/13/2015   BUN 17 09/13/2015   NA 135 09/13/2015   K 3.9 09/13/2015   CL 98* 09/13/2015   CO2 26 09/13/2015    Lab Results  Component Value Date   HGBA1C 9.1 09/14/2015   Lipid Panel     Component Value Date/Time   CHOL 184 08/25/2014 1051   TRIG 78 08/25/2014 1051   HDL 62 08/25/2014 1051   CHOLHDL 3.0 08/25/2014 1051   VLDL 16 08/25/2014 1051   LDLCALC 106* 08/25/2014 1051       Assessment and plan:   Kaitlin Smith was seen today for skin ulcer.  Diagnoses and all orders for this visit:  Skin ulcer, limited to breakdown of skin (HCC) Area looks like a skin irritation, not pressure ulcer. Patient may use OTC A&D to area for skin protectant. Keep area clean and dry.   Type 2 diabetes mellitus without complication, with long-term current use of insulin (HCC) -     Glucose (CBG) -     insulin glargine (LANTUS) 100 UNIT/ML injection; Inject 0-0.1 mLs (0-10 Units total) into the skin at bedtime. Sliding scale Reports that sugars are now 85 to 110. I expect her A1C will drop back down to normal on repeat. May f/u in 2 months for a recheck.       Return in  about 8 weeks (around 01/07/2016) for DM f/u.   [name], NP-C [facility] [phone] 11/12/2015, 2:52 PM

## 2015-11-12 NOTE — Progress Notes (Signed)
Patient has taken meds today Daughter noticed area of erythema on patient buttocks recently Patient states it is not painful

## 2015-12-15 ENCOUNTER — Other Ambulatory Visit: Payer: Self-pay | Admitting: Internal Medicine

## 2015-12-22 ENCOUNTER — Other Ambulatory Visit: Payer: Self-pay | Admitting: Internal Medicine

## 2016-01-03 ENCOUNTER — Other Ambulatory Visit: Payer: Self-pay | Admitting: Internal Medicine

## 2016-01-07 ENCOUNTER — Other Ambulatory Visit: Payer: Self-pay | Admitting: Internal Medicine

## 2016-01-10 ENCOUNTER — Other Ambulatory Visit: Payer: Self-pay | Admitting: Internal Medicine

## 2016-01-14 ENCOUNTER — Other Ambulatory Visit: Payer: Self-pay | Admitting: Internal Medicine

## 2016-01-21 ENCOUNTER — Encounter: Payer: Self-pay | Admitting: Cardiovascular Disease

## 2016-01-21 ENCOUNTER — Ambulatory Visit (INDEPENDENT_AMBULATORY_CARE_PROVIDER_SITE_OTHER): Payer: Federal, State, Local not specified - PPO | Admitting: Cardiovascular Disease

## 2016-01-21 ENCOUNTER — Ambulatory Visit: Payer: Federal, State, Local not specified - PPO | Admitting: Cardiovascular Disease

## 2016-01-21 VITALS — BP 108/64 | HR 56 | Ht 62.0 in | Wt 148.1 lb

## 2016-01-21 DIAGNOSIS — I739 Peripheral vascular disease, unspecified: Secondary | ICD-10-CM

## 2016-01-21 DIAGNOSIS — Z8673 Personal history of transient ischemic attack (TIA), and cerebral infarction without residual deficits: Secondary | ICD-10-CM

## 2016-01-21 DIAGNOSIS — I10 Essential (primary) hypertension: Secondary | ICD-10-CM

## 2016-01-21 DIAGNOSIS — E785 Hyperlipidemia, unspecified: Secondary | ICD-10-CM | POA: Diagnosis not present

## 2016-01-21 MED ORDER — SIMVASTATIN 20 MG PO TABS: 20.0000 mg | ORAL_TABLET | Freq: Every day | ORAL | Status: DC

## 2016-01-21 MED ORDER — CLONIDINE HCL 0.2 MG PO TABS: 0.2000 mg | ORAL_TABLET | Freq: Every day | ORAL | Status: DC

## 2016-01-21 MED ORDER — AMLODIPINE BESYLATE 10 MG PO TABS: 10.0000 mg | ORAL_TABLET | Freq: Every day | ORAL | Status: DC

## 2016-01-21 MED ORDER — CLOPIDOGREL BISULFATE 75 MG PO TABS: 75.0000 mg | ORAL_TABLET | Freq: Every day | ORAL | Status: DC

## 2016-01-21 MED ORDER — EZETIMIBE 10 MG PO TABS: 10.0000 mg | ORAL_TABLET | Freq: Every day | ORAL | Status: DC

## 2016-01-21 MED ORDER — PANTOPRAZOLE SODIUM 40 MG PO TBEC: 40.0000 mg | DELAYED_RELEASE_TABLET | Freq: Every day | ORAL | Status: DC

## 2016-01-21 MED ORDER — HYDRALAZINE HCL 10 MG PO TABS: 10.0000 mg | ORAL_TABLET | Freq: Two times a day (BID) | ORAL | Status: DC

## 2016-01-21 NOTE — Progress Notes (Signed)
01/21/2016 Ascencion DikeLannie Garramone   Feb 06, 1930  [id number]  Primary Physician Ambrose FinlandValerie A Keck, NP Primary Cardiologist: Runell GessJonathan J. Ernestine Rohman MD Roseanne RenoFACP,FACC,FAHA, FSCAI   HPI:  Miss Yetta BarreJones is a 82 year old mildly overweight widowed African-American female with no children who lives with her niece who accompanies her today. I last saw her in the office /8/16. [place] She was referred by Dr. Leeanne Deeduchman, her podiatrist, for peripheral vascular evaluation because of nonpalpable pulses. Her cardiovascular risk factor profile is notable for hyperlipidemia, diabetes and hypertension. She apparently set a stroke in March of this year with known carotid artery disease. She relates occlusion of her right carotid and known left carotid disease but she declined revascularization. She apparently also had a myocardial infarction in 2014 but was unaware of this. She gets occasional chest pain. She stopped smoking 2 years ago but was an infrequent smoker. She denies claudication. She does have symptoms compatible with diabetic peripheral neuropathy. Recent lower extremity arterial Doppler studies performed in our office 07/31/14 revealed ABIs of 0.8 bilaterally with high-grade bilateral SFA disease and severe tibial vessel disease. Her TBIs were any 0.5 range bilaterally as well. She really denies claudication but is limited by bilateral knee arthritis. Since I saw her year ago she's remained chronically stable. She is legally blind and essentially nonambulatory. She denies chest pain or shortness of breath.  Current Outpatient Prescriptions  Medication Sig Dispense Refill  . amLODipine (NORVASC) 10 MG tablet TAKE 1 TABLET BY MOUTH EVERY DAY 30 tablet 3  . cloNIDine (CATAPRES) 0.2 MG tablet TAKE 1 TABLET BY MOUTH EVERY DAY 30 tablet 2  . clopidogrel (PLAVIX) 75 MG tablet Take 1 tablet (75 mg total) by mouth daily. 30 tablet 5  . [employer] STOOL SOFTENER 100 MG capsule TAKE ONE CAPSULE  BY MOUTH EVERY DAY 30 capsule 3  . diclofenac sodium (VOLTAREN) 1 % GEL Apply 4 g topically 2 (two) times daily as needed. (Patient taking differently: Apply 4 g topically 2 (two) times daily as needed (for pain.). ) 1 Tube 3  . ezetimibe (ZETIA) 10 MG tablet Take 1 tablet (10 mg total) by mouth daily. 30 tablet 6  . glucose blood (FREESTYLE LITE) test strip Check 3 times per day for E11.9 100 each 12  . hydrALAZINE (APRESOLINE) 10 MG tablet TAKE 1 TABLET BY MOUTH TWICE A DAY 60 tablet 2  . insulin glargine (LANTUS) 100 UNIT/ML injection Inject 0-0.1 mLs (0-10 Units total) into the skin at bedtime. Sliding scale 10 mL 3  . insulin lispro (HUMALOG) 100 UNIT/ML injection Inject 0.1 mLs (10 Units total) into the skin 3 (three) times daily with meals. Reported on 09/14/2015 10 mL 11  . Insulin Syringe-Needle U-100 (BD INSULIN SYRINGE ULTRAFINE) 31G X 15/64" 0.5 ML MISC Take insulin once nightly as needed 90 each 3  . Lancets (FREESTYLE) lancets Check 3 times per day for E11.9 100 each 12  . LUMIGAN 0.01 % SOLN Place 1 drop into the left eye at bedtime.     . Multiple Vitamin (MULTIVITAMIN WITH MINERALS) TABS tablet Take 1 tablet by mouth daily. One-A-Day    . pantoprazole (PROTONIX) 40 MG tablet TAKE 1 TABLET BY MOUTH EVERY DAY 30 tablet 2  . polyethylene glycol powder (GLYCOLAX/MIRALAX) powder Take 17 g by mouth daily. 850 g 4  . SIMBRINZA 1-0.2 % SUSP Place 1 drop into the left eye at bedtime.     . simvastatin (ZOCOR) 20 MG  tablet Take 1 tablet (20 mg total) by mouth at bedtime. 30 tablet 5  . Triamcinolone Acetonide (KENALOG IJ) Inject as directed. Had at Dr's office in her right knee     No current facility-administered medications for this visit.    Allergies  Allergen Reactions  . Milk-Related Compounds Other (See Comments)    Lactose intolerant.      Social History   Social History  . Marital Status: Widowed    Spouse Name: N/A  . Number of Children: N/A  . Years of Education: N/A    Occupational History  . Not on file.   Social History Main Topics  . Smoking status: Former Smoker    Types: Cigarettes    Quit date: 06/06/2002  . Smokeless tobacco: Not on file     Comment: Quit at age 52  . Alcohol Use: No  . Drug Use: No  . Sexual Activity: No   Other Topics Concern  . Not on file   Social History Narrative     Review of Systems: General: negative for chills, fever, night sweats or weight changes.  Cardiovascular: negative for chest pain, dyspnea on exertion, edema, orthopnea, palpitations, paroxysmal nocturnal dyspnea or shortness of breath Dermatological: negative for rash Respiratory: negative for cough or wheezing Urologic: negative for hematuria Abdominal: negative for nausea, vomiting, diarrhea, bright red blood per rectum, melena, or hematemesis Neurologic: negative for visual changes, syncope, or dizziness All other systems reviewed and are otherwise negative except as noted above.    Blood pressure 108/64, pulse 56, height  (1.575 m), weight 148 lb 2 oz (67.189 kg).  General appearance: alert and no distress Neck: no adenopathy, no JVD, supple, symmetrical, trachea midline, thyroid not enlarged, symmetric, no tenderness/mass/nodules and soft left carotid bruit Lungs: clear to auscultation bilaterally Heart: regular rate and rhythm, S1, S2 normal, no murmur, click, rub or gallop Extremities: extremities normal, atraumatic, no cyanosis or edema  EKG ot performed today  ASSESSMENT AND PLAN:   Essential hypertension History of hypertension blood pressure measured today at 108/64. She is on amlodipine, clonidine and hydralazine. Continue current meds at current dosing  Occlusion and stenosis of carotid artery without mention of cerebral infarction History of carotid artery disease with Doppler performed August 2015 that showed high-grade left ICA stenosis although the patient declined revascularization at that  time.  Hyperlipidemia History of hyperlipidemia on statin therapy and Zetia with recent lipid profile performed 08/25/14 revealed a total cholesterol 184, LDL 106 and HDL of 62.  Peripheral arterial disease History of peripheral arterial disease with Dopplers performed 07/31/14 revealing ABIs in the 0.8 range with high-grade bilateral SFA disease as well as severe tibial disease She really does not ambulate nor does she have critical limb ischemia      Runell Gess MD [facility] 01/21/2016 11:40 AM

## 2016-01-21 NOTE — Assessment & Plan Note (Signed)
History of hyperlipidemia on statin therapy and Zetia with recent lipid profile performed 08/25/14 revealed a total cholesterol 184, LDL 106 and HDL of 62.

## 2016-01-21 NOTE — Addendum Note (Signed)
Addended by: Kandice RobinsonsYOUNG, Harden Bramer T on: 01/21/2016 05:12 PM   Modules accepted: Orders

## 2016-01-21 NOTE — Assessment & Plan Note (Signed)
History of peripheral arterial disease with Dopplers performed 07/31/14 revealing ABIs in the 0.8 range with high-grade bilateral SFA disease as well as severe tibial disease She really does not ambulate nor does she have critical limb ischemia

## 2016-01-21 NOTE — Assessment & Plan Note (Signed)
History of carotid artery disease with Doppler performed August 2015 that showed high-grade left ICA stenosis although the patient declined revascularization at that time.

## 2016-01-21 NOTE — Assessment & Plan Note (Signed)
History of hypertension blood pressure measured today at 108/64. She is on amlodipine, clonidine and hydralazine. Continue current meds at current dosing

## 2016-01-21 NOTE — Patient Instructions (Signed)
Medication Instructions:  NO CHANGES  Labwork: NONE  Testing/Procedures: NONE  Follow-Up: 1YEAR WITH DR BERRY  Any Other Special Instructions Will Be Listed Below (If Applicable).     If you need a refill on your cardiac medications before your next appointment, please call your pharmacy.

## 2016-02-16 ENCOUNTER — Ambulatory Visit: Payer: Federal, State, Local not specified - PPO | Admitting: Podiatry

## 2016-02-25 ENCOUNTER — Telehealth: Payer: Self-pay | Admitting: *Deleted

## 2016-02-25 NOTE — Telephone Encounter (Signed)
Prior authorization form received for medication Pantoprazole 40 mg tablets, take 1 tablet by mouth every day. Called [employer] Prior Auth department at [id number]. ICD 10: I21.9 for GERD given to representative for approval.  Medication was approved for time period 12/27/15 to 02/24/17.

## 2016-03-09 ENCOUNTER — Ambulatory Visit (INDEPENDENT_AMBULATORY_CARE_PROVIDER_SITE_OTHER): Payer: Federal, State, Local not specified - PPO | Admitting: Podiatry

## 2016-03-09 ENCOUNTER — Encounter: Payer: Self-pay | Admitting: Podiatry

## 2016-03-09 DIAGNOSIS — B351 Tinea unguium: Secondary | ICD-10-CM

## 2016-03-09 DIAGNOSIS — M79676 Pain in unspecified toe(s): Secondary | ICD-10-CM | POA: Diagnosis not present

## 2016-03-09 NOTE — Patient Instructions (Signed)
Remove the Band-Aid on the third left toe on 1-3 days and apply topical antibiotic ointment and a Band-Aid until a scab forms  Diabetes and Foot Care Diabetes may cause you to have problems because of poor blood supply (circulation) to your feet and legs. This may cause the skin on your feet to become thinner, break easier, and heal more slowly. Your skin may become dry, and the skin may peel and crack. You may also have nerve damage in your legs and feet causing decreased feeling in them. You may not notice minor injuries to your feet that could lead to infections or more serious problems. Taking care of your feet is one of the most important things you can do for yourself.  HOME CARE INSTRUCTIONS  Wear shoes at all times, even in the house. Do not go barefoot. Bare feet are easily injured.  Check your feet daily for blisters, cuts, and redness. If you cannot see the bottom of your feet, use a mirror or ask someone for help.  Wash your feet with warm water (do not use hot water) and mild soap. Then pat your feet and the areas between your toes until they are completely dry. Do not soak your feet as this can dry your skin.  Apply a moisturizing lotion or petroleum jelly (that does not contain alcohol and is unscented) to the skin on your feet and to dry, brittle toenails. Do not apply lotion between your toes.  Trim your toenails straight across. Do not dig under them or around the cuticle. File the edges of your nails with an emery board or nail file.  Do not cut corns or calluses or try to remove them with medicine.  Wear clean socks or stockings every day. Make sure they are not too tight. Do not wear knee-high stockings since they may decrease blood flow to your legs.  Wear shoes that fit properly and have enough cushioning. To break in new shoes, wear them for just a few hours a day. This prevents you from injuring your feet. Always look in your shoes before you put them on to be sure there  are no objects inside.  Do not cross your legs. This may decrease the blood flow to your feet.  If you find a minor scrape, cut, or break in the skin on your feet, keep it and the skin around it clean and dry. These areas may be cleansed with mild soap and water. Do not cleanse the area with peroxide, alcohol, or iodine.  When you remove an adhesive bandage, be sure not to damage the skin around it.  If you have a wound, look at it several times a day to make sure it is healing.  Do not use heating pads or hot water bottles. They may burn your skin. If you have lost feeling in your feet or legs, you may not know it is happening until it is too late.  Make sure your health care provider performs a complete foot exam at least annually or more often if you have foot problems. Report any cuts, sores, or bruises to your health care provider immediately. SEEK MEDICAL CARE IF:   You have an injury that is not healing.  You have cuts or breaks in the skin.  You have an ingrown nail.  You notice redness on your legs or feet.  You feel burning or tingling in your legs or feet.  You have pain or cramps in your legs and feet.  Your legs or feet are numb.  Your feet always feel cold. SEEK IMMEDIATE MEDICAL CARE IF:   There is increasing redness, swelling, or pain in or around a wound.  There is a red line that goes up your leg.  Pus is coming from a wound.  You develop a fever or as directed by your health care provider.  You notice a bad smell coming from an ulcer or wound.   This information is not intended to replace advice given to you by your health care provider. Make sure you discuss any questions you have with your health care provider.   Document Released: 09/02/2000 Document Revised: 05/08/2013 Document Reviewed: 02/12/2013 Elsevier Interactive Patient Education [employer].

## 2016-03-10 NOTE — Progress Notes (Signed)
Patient ID: Kaitlin Smith, female   DOB: 11/04/1929, 82 y.o.   MRN: [id number]  Subjective: As patient presents for scheduled visit complaining that her toenails are elongated and thickened and uncomfortable walking wearing shoes and request toenail debridement Patient's niece and grandson present in treatment room  Objective: Orientated 3 No open skin lesions bilaterally The toenails are hypertrophic, elongated, discolored, deformed and tender to direct palpation 6-10  Assessment: Symptomatic onychomycoses 6-10 Diabetic with a history of peripheral arterial disease  Plan: Debridement toenails 6-10 mechanically and electrically with slight bleeding distal third left toe. Antibiotic dressing applied to the third left toe. Patient instructed removed Band-Aid 1-3 days and apply topical antibiotic ointment to the third left toenail daily until a scab forms  Reappoint 3 months or sooner if patient has a concern

## 2016-04-09 ENCOUNTER — Other Ambulatory Visit: Payer: Self-pay | Admitting: Internal Medicine

## 2016-04-11 NOTE — Telephone Encounter (Signed)
Rx request 

## 2016-04-18 ENCOUNTER — Other Ambulatory Visit: Payer: Self-pay | Admitting: Cardiology

## 2016-04-23 ENCOUNTER — Other Ambulatory Visit: Payer: Self-pay | Admitting: Cardiology

## 2016-04-25 NOTE — Telephone Encounter (Signed)
Rx(s) sent to pharmacy electronically.  

## 2016-05-03 ENCOUNTER — Other Ambulatory Visit: Payer: Self-pay | Admitting: Internal Medicine

## 2016-05-03 DIAGNOSIS — I10 Essential (primary) hypertension: Secondary | ICD-10-CM

## 2016-05-16 ENCOUNTER — Other Ambulatory Visit: Payer: Self-pay | Admitting: Internal Medicine

## 2016-05-16 DIAGNOSIS — E119 Type 2 diabetes mellitus without complications: Secondary | ICD-10-CM

## 2016-06-21 ENCOUNTER — Encounter: Payer: Self-pay | Admitting: Podiatry

## 2016-06-21 ENCOUNTER — Ambulatory Visit (INDEPENDENT_AMBULATORY_CARE_PROVIDER_SITE_OTHER): Payer: Federal, State, Local not specified - PPO | Admitting: Podiatry

## 2016-06-21 VITALS — BP 180/82 | HR 62 | Resp 16

## 2016-06-21 DIAGNOSIS — M79676 Pain in unspecified toe(s): Secondary | ICD-10-CM | POA: Diagnosis not present

## 2016-06-21 DIAGNOSIS — B351 Tinea unguium: Secondary | ICD-10-CM

## 2016-06-21 NOTE — Patient Instructions (Signed)
Diabetes and Foot Care Diabetes may cause you to have problems because of poor blood supply (circulation) to your feet and legs. This may cause the skin on your feet to become thinner, break easier, and heal more slowly. Your skin may become dry, and the skin may peel and crack. You may also have nerve damage in your legs and feet causing decreased feeling in them. You may not notice minor injuries to your feet that could lead to infections or more serious problems. Taking care of your feet is one of the most important things you can do for yourself.  HOME CARE INSTRUCTIONS  Wear shoes at all times, even in the house. Do not go barefoot. Bare feet are easily injured.  Check your feet daily for blisters, cuts, and redness. If you cannot see the bottom of your feet, use a mirror or ask someone for help.  Wash your feet with warm water (do not use hot water) and mild soap. Then pat your feet and the areas between your toes until they are completely dry. Do not soak your feet as this can dry your skin.  Apply a moisturizing lotion or petroleum jelly (that does not contain alcohol and is unscented) to the skin on your feet and to dry, brittle toenails. Do not apply lotion between your toes.  Trim your toenails straight across. Do not dig under them or around the cuticle. File the edges of your nails with an emery board or nail file.  Do not cut corns or calluses or try to remove them with medicine.  Wear clean socks or stockings every day. Make sure they are not too tight. Do not wear knee-high stockings since they may decrease blood flow to your legs.  Wear shoes that fit properly and have enough cushioning. To break in new shoes, wear them for just a few hours a day. This prevents you from injuring your feet. Always look in your shoes before you put them on to be sure there are no objects inside.  Do not cross your legs. This may decrease the blood flow to your feet.  If you find a minor scrape,  cut, or break in the skin on your feet, keep it and the skin around it clean and dry. These areas may be cleansed with mild soap and water. Do not cleanse the area with peroxide, alcohol, or iodine.  When you remove an adhesive bandage, be sure not to damage the skin around it.  If you have a wound, look at it several times a day to make sure it is healing.  Do not use heating pads or hot water bottles. They may burn your skin. If you have lost feeling in your feet or legs, you may not know it is happening until it is too late.  Make sure your health care provider performs a complete foot exam at least annually or more often if you have foot problems. Report any cuts, sores, or bruises to your health care provider immediately. SEEK MEDICAL CARE IF:   You have an injury that is not healing.  You have cuts or breaks in the skin.  You have an ingrown nail.  You notice redness on your legs or feet.  You feel burning or tingling in your legs or feet.  You have pain or cramps in your legs and feet.  Your legs or feet are numb.  Your feet always feel cold. SEEK IMMEDIATE MEDICAL CARE IF:   There is increasing redness,   swelling, or pain in or around a wound.  There is a red line that goes up your leg.  Pus is coming from a wound.  You develop a fever or as directed by your health care provider.  You notice a bad smell coming from an ulcer or wound.   This information is not intended to replace advice given to you by your health care provider. Make sure you discuss any questions you have with your health care provider.   Document Released: 09/02/2000 Document Revised: 05/08/2013 Document Reviewed: 02/12/2013 Elsevier Interactive Patient Education 2016 [employer]  

## 2016-06-21 NOTE — Progress Notes (Signed)
Patient ID: Kaitlin Smith, female   DOB: [birth year], 82 y.o.   MRN: [id number]   Subjective: As patient presents for scheduled visit complaining that her toenails are elongated and thickened and uncomfortable walking wearing shoes and request toenail debridement Patient's niece  Is present in treatment room  Objective: Orientated 3 DP and PT pulses 0/4 bilaterally Capillary reflex delayed bilaterally Patient walks slowly with walker No open skin lesions bilaterally The toenails are hypertrophic, elongated, discolored, deformed and tender to direct palpation 6-10  Assessment: Symptomatic onychomycoses 6-10 Diabetic with a history of peripheral arterial disease  Plan: Debridement toenails 6-10 mechanically and electrically without any bleeding  Reappoint 3 months

## 2016-07-11 ENCOUNTER — Other Ambulatory Visit: Payer: Self-pay | Admitting: Internal Medicine

## 2016-08-08 ENCOUNTER — Other Ambulatory Visit: Payer: Self-pay | Admitting: Internal Medicine

## 2016-08-16 ENCOUNTER — Other Ambulatory Visit: Payer: Self-pay | Admitting: Internal Medicine

## 2016-08-22 ENCOUNTER — Other Ambulatory Visit: Payer: Self-pay | Admitting: Cardiovascular Disease

## 2016-08-22 DIAGNOSIS — Z8673 Personal history of transient ischemic attack (TIA), and cerebral infarction without residual deficits: Secondary | ICD-10-CM

## 2016-08-22 NOTE — Telephone Encounter (Signed)
Rx(s) sent to pharmacy electronically.  

## 2016-08-26 ENCOUNTER — Other Ambulatory Visit: Payer: Self-pay | Admitting: Internal Medicine

## 2016-09-26 ENCOUNTER — Other Ambulatory Visit: Payer: Self-pay | Admitting: Internal Medicine

## 2016-09-27 ENCOUNTER — Ambulatory Visit: Payer: Federal, State, Local not specified - PPO | Admitting: Podiatry

## 2016-10-01 ENCOUNTER — Other Ambulatory Visit: Payer: Self-pay | Admitting: Internal Medicine

## 2016-10-27 ENCOUNTER — Ambulatory Visit (INDEPENDENT_AMBULATORY_CARE_PROVIDER_SITE_OTHER): Payer: Federal, State, Local not specified - PPO | Admitting: Neurology

## 2016-10-27 ENCOUNTER — Encounter: Payer: Self-pay | Admitting: Neurology

## 2016-10-27 VITALS — BP 174/68 | HR 91 | Ht 62.0 in | Wt 158.0 lb

## 2016-10-27 DIAGNOSIS — I63231 Cerebral infarction due to unspecified occlusion or stenosis of right carotid arteries: Secondary | ICD-10-CM | POA: Diagnosis not present

## 2016-10-27 DIAGNOSIS — I739 Peripheral vascular disease, unspecified: Secondary | ICD-10-CM

## 2016-10-27 DIAGNOSIS — I779 Disorder of arteries and arterioles, unspecified: Secondary | ICD-10-CM

## 2016-10-27 DIAGNOSIS — H5316 Psychophysical visual disturbances: Secondary | ICD-10-CM | POA: Diagnosis not present

## 2016-10-27 DIAGNOSIS — E118 Type 2 diabetes mellitus with unspecified complications: Secondary | ICD-10-CM

## 2016-10-27 DIAGNOSIS — I10 Essential (primary) hypertension: Secondary | ICD-10-CM

## 2016-10-27 DIAGNOSIS — E785 Hyperlipidemia, unspecified: Secondary | ICD-10-CM

## 2016-10-27 DIAGNOSIS — Z794 Long term (current) use of insulin: Secondary | ICD-10-CM

## 2016-10-27 MED ORDER — ESCITALOPRAM OXALATE 10 MG PO TABS: 10.0000 mg | ORAL_TABLET | Freq: Every day | ORAL | 2 refills | Status: DC

## 2016-10-27 NOTE — Progress Notes (Signed)
NEUROLOGY FOLLOW UP OFFICE NOTE  Kaitlin Smith [id number]  HISTORY OF PRESENT ILLNESS: Kaitlin Smith is an 83 year old right-handed woman with type 2 diabetes, hypertension, hyperlipidemia, peripheral vascular disease, who is blind and history of smoking, stroke and MI who follows up for Maureen Ralphs Syndrome and cerebrovascular disease.  Labs reviewed.  She is accompanied by her niece who provides some history.   UPDATE: She continues to have visual hallucinations, including seeing children, animals and faces.  They have increased.  However, this past week, she reports that she feels the children touching her.  It only occurs at night when she is in bed.  She may have been sleeping.  She does not have a UTI.  She has not had any decline in cognition.  Memory is good.  She is not disoriented.  She has not exhibited any changes in behavior.  HISTORY: She had a stroke in March 2015 while living in [place], which presented with left sided weakness.  She is blind and couldn't maneuver out of the corner, causing distress and elevated blood pressure.  After the stroke, she moved down to [place] to live with her niece.  She requires some assistance with ADLs due to her blindness.  Her memory is fairly well-preserved.  She has not exhibited change in behavior or personality.  She does not exhibit episodes of sundowning.  Sometimes, she has visual hallucinations such as seeing faces or animals.  She is aware that they are not real.  She has right ICA occlusion.  She has severe 80-99% left ICA stenosis, as demonstrated on carotid doppler from 06/06/14.  She saw vascular surgery, who felt that a stent was indicated but that the risks outweighed the benefits so she did not have the surgery.  She is on Plavix, simvastatin and Zetia.  PAST MEDICAL HISTORY: Past Medical History:  Diagnosis Date  . Allergy   . Arthritis   . Blindness   . Coronary artery disease   . Diabetes mellitus without  complication (HCC)   . Glaucoma   . Hyperlipidemia   . Hypertension   . Myocardial infarction   . Peripheral vascular disease (HCC)   . Stroke [facility])     MEDICATIONS: Current Outpatient Prescriptions on File Prior to Visit  Medication Sig Dispense Refill  . amLODipine (NORVASC) 10 MG tablet Take 1 tablet (10 mg total) by mouth daily. 30 tablet 6  . cloNIDine (CATAPRES) 0.2 MG tablet Take 1 tablet (0.2 mg total) by mouth daily. 30 tablet 6  . clopidogrel (PLAVIX) 75 MG tablet TAKE 1 TABLET (75 MG TOTAL) BY MOUTH DAILY. 30 tablet 6  . [facility] STOOL SOFTENER 100 MG capsule TAKE ONE CAPSULE BY MOUTH EVERY DAY 30 capsule 0  . ezetimibe (ZETIA) 10 MG tablet Take 1 tablet (10 mg total) by mouth daily. 30 tablet 6  . FREESTYLE LITE test strip CHECK 3 TIMES PER DAY 100 each 3  . hydrALAZINE (APRESOLINE) 10 MG tablet TAKE 1 TABLET BY MOUTH TWICE A DAY (Patient taking differently: TAKE 1 TABLET BY MOUTH ONCE DAILY) 60 tablet 0  . insulin glargine (LANTUS) 100 UNIT/ML injection Inject 0-0.1 mLs (0-10 Units total) into the skin at bedtime. Sliding scale 10 mL 3  . insulin lispro (HUMALOG) 100 UNIT/ML injection Inject 0.1 mLs (10 Units total) into the skin 3 (three) times daily with meals. Reported on 09/14/2015 10 mL 11  . Insulin Syringe-Needle U-100 (BD INSULIN SYRINGE ULTRAFINE) 31G X 15/64" 0.5 ML  MISC Take insulin once nightly as needed 90 each 3  . Lancets (FREESTYLE) lancets Check 3 times per day for E11.9 100 each 12  . LUMIGAN 0.01 % SOLN Place 1 drop into the left eye at bedtime.     . Multiple Vitamin (MULTIVITAMIN WITH MINERALS) TABS tablet Take 1 tablet by mouth daily. One-A-Day    . pantoprazole (PROTONIX) 40 MG tablet TAKE 1 TABLET BY MOUTH EVERY DAY 30 tablet 6  . polyethylene glycol powder (GLYCOLAX/MIRALAX) powder Take 17 g by mouth daily. 850 g 4  . SIMBRINZA 1-0.2 % SUSP Place 1 drop into the left eye at bedtime.     . simvastatin (ZOCOR) 20 MG tablet TAKE 1 TABLET BY MOUTH AT BEDTIME  30 tablet 6  . Triamcinolone Acetonide (KENALOG IJ) Inject as directed. Had at Dr's office in her right knee    . VOLTAREN 1 % GEL APPLY 4 GRAMS TOPICALLY 2 TIMES DAILY AS NEEDED. 100 g 0   No current facility-administered medications on file prior to visit.     ALLERGIES: Allergies  Allergen Reactions  . Milk-Related Compounds Other (See Comments)    Lactose intolerant.      FAMILY HISTORY: Family History  Problem Relation Age of Onset  . Diabetes Mother   . Stroke Mother   . Peripheral vascular disease Mother     amputation  . Cancer Brother   . Diabetes Brother   . Alcohol abuse Brother   . Varicose Veins Brother   . Hypertension Father   . Varicose Veins Father   . Heart attack Father   . Cancer Father   . Diabetes Sister   . Hypertension Sister     SOCIAL HISTORY: Social History   Social History  . Marital status: Widowed    Spouse name: N/A  . Number of children: N/A  . Years of education: N/A   Occupational History  . Not on file.   Social History Main Topics  . Smoking status: Former Smoker    Types: Cigarettes    Quit date: 06/06/2002  . Smokeless tobacco: Never Used     Comment: Quit at age 83  . Alcohol use No  . Drug use: No  . Sexual activity: No   Other Topics Concern  . Not on file   Social History Narrative  . No narrative on file    REVIEW OF SYSTEMS: Constitutional: No fevers, chills, or sweats, no generalized fatigue, change in appetite Eyes: No visual changes, double vision, eye pain Ear, nose and throat: No hearing loss, ear pain, nasal congestion, sore throat Cardiovascular: No chest pain, palpitations Respiratory:  No shortness of breath at rest or with exertion, wheezes GastrointestinaI: No nausea, vomiting, diarrhea, abdominal pain, fecal incontinence Genitourinary:  No dysuria, urinary retention or frequency Musculoskeletal:  No neck pain, back pain Integumentary: No rash, pruritus, skin lesions Neurological: as  above Psychiatric: No depression, insomnia, anxiety Endocrine: No palpitations, fatigue, diaphoresis, mood swings, change in appetite, change in weight, increased thirst Hematologic/Lymphatic:  No purpura, petechiae. Allergic/Immunologic: no itchy/runny eyes, nasal congestion, recent allergic reactions, rashes  PHYSICAL EXAM: Vitals:   10/27/16 1102  BP: (!) 174/68  Pulse: 91   General: No acute distress.  Patient appears well-groomed.  normal body habitus. Head:  Normocephalic/atraumatic Eyes:  Fundi examined but not visualized Neck: supple, no paraspinal tenderness, full range of motion Heart:  Regular rate and rhythm Lungs:  Clear to auscultation bilaterally Back: No paraspinal tenderness Neurological Exam: alert and oriented  to person, place, month (not year, date or day). Attention span and concentration intact, recent and remote memory intact, fund of knowledge intact.  Speech fluent and not dysarthric, language intact.   MMSE - Mini Mental State Exam 10/27/2016  Not completed: Unable to complete  Orientation to time 4  Orientation to time comments For date, said 12 instead of 8  Orientation to Place 4  Orientation to Place-comments did not know the floor, but she is blind and wasn't told  Registration 3  Attention/ Calculation 5  Recall 3  Language- name 2 objects (No Data)  Language- name 2 objects-comments unable to perform  Language- repeat 1  Language- follow 3 step command 3  Language- read & follow direction (No Data)  Language-read & follow direction-comments unable to perform  Write a sentence (No Data)  Write a sentence-comments unable to perform  Copy design (No Data)  Copy design-comments unable to perform   Right pupil fixed.  Left pupil minimally reactive.  Otherwise, CN II-XII intact. Bulk and tone normal, muscle strength 5/5 throughout.  Sensation to light touch intact.  Deep tendon reflexes absent.  Finger to nose testing intact.  Gait  normal.  IMPRESSION: 1.  Maureen Ralphs Syndrome.  I suspect that the sensory hallucinations are part of vivid dreams.  Testing and history still does not suggest dementia. 2.  History of stroke, presumably due to right carotid artery occlusion 3.  Carotid artery disease with right carotid artery occlusion and severe left carotid artery stenosis -  Due to risks, surgery was not recommended 4.  Hypertension 5.  Hyperlipidemia 6.  Type 2 diabetes  PLAN: 1.  We will start Lexapro 10mg  daily.  SSRIs may sometimes help with the hallucinations. 2.  Monitor for hallucinations 3.  Plavix and statin therapy for secondary stroke prevention as managed by cardiology.  Recommend high-dose statin with LDL goal less than 70. 4.  Follow up with PCP or cardiologist regarding elevated blood pressure 5.  Glycemic control 6.  Repeat carotid dopplers likely not indicated as she does not want surgical intervention (and vascular surgery thought risks outweighed benefits). 7.  Follow up in 6 months.  26 minutes spent face to face with patient, over 50% spent discussing hallucinations, treatment, and secondary stroke prevention.Shon Millet, DO  CC:  Alysia Penna, MD  Nanetta Batty, MD

## 2016-10-27 NOTE — Patient Instructions (Signed)
1.  Monitor the sensory hallucinations.  At this point, I think they are related to vivid dreams 2.  To help reduce the visual hallucinations, we will start an antidepressant which may be beneficial.  I will start you on escitalopram (Lexapro) 10mg  daily. 3.  Follow up in 3 months.

## 2016-10-28 NOTE — Progress Notes (Signed)
Note routed

## 2016-10-29 ENCOUNTER — Other Ambulatory Visit: Payer: Self-pay | Admitting: Family Medicine

## 2016-10-29 ENCOUNTER — Other Ambulatory Visit: Payer: Self-pay | Admitting: Internal Medicine

## 2016-10-29 DIAGNOSIS — E119 Type 2 diabetes mellitus without complications: Secondary | ICD-10-CM

## 2016-10-29 MED ORDER — INSULIN LISPRO 100 UNIT/ML ~~LOC~~ SOLN: SUBCUTANEOUS | 0 refills | Status: DC

## 2016-10-29 NOTE — Addendum Note (Signed)
Addended by: Clarene CritchleyKOLLER, KAREN M on: 10/29/2016 02:54 PM   Modules accepted: Orders

## 2016-10-31 ENCOUNTER — Other Ambulatory Visit: Payer: Self-pay | Admitting: Internal Medicine

## 2016-11-27 ENCOUNTER — Other Ambulatory Visit: Payer: Self-pay | Admitting: Internal Medicine

## 2016-11-27 DIAGNOSIS — E119 Type 2 diabetes mellitus without complications: Secondary | ICD-10-CM

## 2016-11-29 ENCOUNTER — Encounter: Payer: Self-pay | Admitting: Podiatry

## 2016-11-29 ENCOUNTER — Ambulatory Visit (INDEPENDENT_AMBULATORY_CARE_PROVIDER_SITE_OTHER): Payer: Federal, State, Local not specified - PPO | Admitting: Podiatry

## 2016-11-29 DIAGNOSIS — E1151 Type 2 diabetes mellitus with diabetic peripheral angiopathy without gangrene: Secondary | ICD-10-CM

## 2016-11-29 DIAGNOSIS — M79676 Pain in unspecified toe(s): Secondary | ICD-10-CM

## 2016-11-29 DIAGNOSIS — I739 Peripheral vascular disease, unspecified: Secondary | ICD-10-CM | POA: Diagnosis not present

## 2016-11-29 DIAGNOSIS — B351 Tinea unguium: Secondary | ICD-10-CM | POA: Diagnosis not present

## 2016-11-29 NOTE — Progress Notes (Signed)
Patient ID: Kaitlin Smith, female   DOB: Jun 16, 1930, 83 y.o.   MRN: [id number]   Subjective: As patient presents for scheduled visit complaining that her toenails are elongated and thickened and uncomfortable walking wearing shoes and request toenail debridement Patient's niece  Is present in treatment room  Objective: Orientated 3 Nonpitting edema bilaterally DP and PT pulses 0/4 bilaterally Capillary reflex delayed bilaterally Sensation to 10 g monofilament wire intact 4/5 right 5/5 left Vibratory sensation reactive bilaterally No open skin lesions bilaterally Atrophic skin bilaterally The toenails are hypertrophic, elongated, discolored, deformed and tender to direct palpation 6-10 Patient walks slowly with walker Daniel motor testing dorsi flexion, plantar flexion 5/5 bilaterally  Assessment: Symptomatic onychomycoses 6-10 Diabetic with a history of peripheral arterial disease  Plan: Debridement toenails 6-10 mechanically and electrically without any bleeding  Reappoint 3 months

## 2016-11-29 NOTE — Patient Instructions (Signed)

## 2016-12-07 ENCOUNTER — Other Ambulatory Visit: Payer: Self-pay | Admitting: Cardiovascular Disease

## 2016-12-08 ENCOUNTER — Other Ambulatory Visit: Payer: Self-pay | Admitting: Physician Assistant

## 2016-12-08 NOTE — Telephone Encounter (Signed)
Patient being managed by internal med FNP Holland CommonsValerie Keck. I notified pharmacy and they will redirect request.

## 2016-12-16 ENCOUNTER — Ambulatory Visit (HOSPITAL_COMMUNITY): Payer: Federal, State, Local not specified - PPO

## 2016-12-27 ENCOUNTER — Ambulatory Visit (HOSPITAL_COMMUNITY)
Admission: RE | Admit: 2016-12-27 | Discharge: 2016-12-27 | Disposition: A | Discharge status: Home / Self Care | Payer: Federal, State, Local not specified - PPO | Source: Ambulatory Visit | Attending: Internal Medicine | Admitting: Internal Medicine

## 2017-01-23 ENCOUNTER — Other Ambulatory Visit: Payer: Self-pay | Admitting: Cardiovascular Disease

## 2017-01-23 DIAGNOSIS — Z8673 Personal history of transient ischemic attack (TIA), and cerebral infarction without residual deficits: Secondary | ICD-10-CM

## 2017-01-24 ENCOUNTER — Ambulatory Visit (INDEPENDENT_AMBULATORY_CARE_PROVIDER_SITE_OTHER): Payer: Federal, State, Local not specified - PPO | Admitting: Cardiovascular Disease

## 2017-01-24 ENCOUNTER — Other Ambulatory Visit: Payer: Self-pay

## 2017-01-24 ENCOUNTER — Encounter: Payer: Self-pay | Admitting: Cardiovascular Disease

## 2017-01-24 VITALS — BP 132/74 | HR 70 | Ht 64.0 in | Wt 160.6 lb

## 2017-01-24 DIAGNOSIS — E7800 Pure hypercholesterolemia, unspecified: Secondary | ICD-10-CM | POA: Diagnosis not present

## 2017-01-24 DIAGNOSIS — Z79899 Other long term (current) drug therapy: Secondary | ICD-10-CM

## 2017-01-24 DIAGNOSIS — I739 Peripheral vascular disease, unspecified: Secondary | ICD-10-CM

## 2017-01-24 DIAGNOSIS — E785 Hyperlipidemia, unspecified: Secondary | ICD-10-CM | POA: Diagnosis not present

## 2017-01-24 DIAGNOSIS — I10 Essential (primary) hypertension: Secondary | ICD-10-CM | POA: Diagnosis not present

## 2017-01-24 MED ORDER — ESCITALOPRAM OXALATE 10 MG PO TABS: 10.0000 mg | ORAL_TABLET | Freq: Every day | ORAL | 2 refills | Status: DC

## 2017-01-24 NOTE — Assessment & Plan Note (Signed)
History of hypertension with blood pressure measured today at 180/74. She is on amlodipine, clonidine and hydralazine. Continue current meds are current dosing

## 2017-01-24 NOTE — Progress Notes (Signed)
01/24/2017 Kaitlin Smith   11-20-1929  [id number]  Primary Physician Kaitlin Penna, MD Primary Cardiologist: Kaitlin Gess MD Kaitlin Smith, [place]  HPI:    Kaitlin Smith is a 83 year old mildly overweight widowed African-American female with no children who lives with her niece who accompanies her today. I last saw her in the office 01/21/16. She recently relocated from [facility] several months ago. She was referred by Dr. Leeanne Smith, her podiatrist, for peripheral vascular evaluation because of nonpalpable pulses. Her cardiovascular risk factor profile is notable for hyperlipidemia, diabetes and hypertension. She apparently set a stroke in March of this year with known carotid artery disease. She relates occlusion of her right carotid and known left carotid disease but she declined revascularization. She apparently also had a myocardial infarction in 2014 but was unaware of this. She gets occasional chest pain. She stopped smoking 2 years ago but was an infrequent smoker. She denies claudication. She does have symptoms compatible with diabetic peripheral neuropathy. Recent lower extremity arterial Doppler studies performed in our office 07/31/14 revealed ABIs of 0.8 bilaterally with high-grade bilateral SFA disease and severe tibial vessel disease. Her TBIs were any 0.5 range bilaterally as well. She really denies claudication but is limited by bilateral knee arthritis. Since I saw her year ago she's remained chronically stable. She is legally blind and essentially nonambulatory. She denies chest pain or shortness of breath.   Current Outpatient Prescriptions  Medication Sig Dispense Refill  . amLODipine (NORVASC) 10 MG tablet TAKE 1 TABLET (10 MG TOTAL) BY MOUTH DAILY. 30 tablet 2  . cloNIDine (CATAPRES) 0.2 MG tablet Take 1 tablet (0.2 mg total) by mouth daily. 30 tablet 6  . clopidogrel (PLAVIX) 75 MG tablet TAKE 1 TABLET (75 MG TOTAL) BY MOUTH DAILY. 30 tablet 6  . [facility] STOOL SOFTENER 100  MG capsule TAKE ONE CAPSULE BY MOUTH EVERY DAY 30 capsule 0  . ezetimibe (ZETIA) 10 MG tablet TAKE 1 TABLET BY MOUTH EVERY DAY 30 tablet 0  . FREESTYLE LITE test strip CHECK 3 TIMES PER DAY 100 each 3  . hydrALAZINE (APRESOLINE) 10 MG tablet TAKE 1 TABLET BY MOUTH TWICE A DAY (Patient taking differently: TAKE 1 TABLET BY MOUTH ONCE DAILY) 60 tablet 0  . insulin glargine (LANTUS) 100 UNIT/ML injection Inject 0-0.1 mLs (0-10 Units total) into the skin at bedtime. Sliding scale 10 mL 3  . insulin lispro (HUMALOG) 100 UNIT/ML injection INJECT 0.1 MILLILITERS (10 UNITS) INTO THE SKIN 3 TIMES A DAY WITH MEALS 10 mL 0  . Insulin Syringe-Needle U-100 (BD INSULIN SYRINGE ULTRAFINE) 31G X 15/64" 0.5 ML MISC Take insulin once nightly as needed 90 each 3  . Lancets (FREESTYLE) lancets Check 3 times per day for E11.9 100 each 12  . LUMIGAN 0.01 % SOLN Place 1 drop into the left eye at bedtime.     . Multiple Vitamin (MULTIVITAMIN WITH MINERALS) TABS tablet Take 1 tablet by mouth daily. One-A-Day    . pantoprazole (PROTONIX) 40 MG tablet TAKE 1 TABLET BY MOUTH EVERY DAY 30 tablet 6  . polyethylene glycol powder (GLYCOLAX/MIRALAX) powder Take 17 g by mouth daily. 850 g 4  . SIMBRINZA 1-0.2 % SUSP Place 1 drop into the left eye at bedtime.     . simvastatin (ZOCOR) 20 MG tablet TAKE 1 TABLET BY MOUTH AT BEDTIME 30 tablet 6  . VOLTAREN 1 % GEL APPLY 4 GRAMS TOPICALLY 2 TIMES DAILY AS NEEDED. 100 g 0  No current facility-administered medications for this visit.     Allergies  Allergen Reactions  . Milk-Related Compounds Other (See Comments)    Lactose intolerant.      Social History   Social History  . Marital status: Widowed    Spouse name: N/A  . Number of children: N/A  . Years of education: N/A   Occupational History  . Not on file.   Social History Main Topics  . Smoking status: Former Smoker    Types: Cigarettes    Quit date: 06/06/2002  . Smokeless tobacco: Never Used     Comment: Quit  at age 89  . Alcohol use No  . Drug use: No  . Sexual activity: No   Other Topics Concern  . Not on file   Social History Narrative  . No narrative on file     Review of Systems: General: negative for chills, fever, night sweats or weight changes.  Cardiovascular: negative for chest pain, dyspnea on exertion, edema, orthopnea, palpitations, paroxysmal nocturnal dyspnea or shortness of breath Dermatological: negative for rash Respiratory: negative for cough or wheezing Urologic: negative for hematuria Abdominal: negative for nausea, vomiting, diarrhea, bright red blood per rectum, melena, or hematemesis Neurologic: negative for visual changes, syncope, or dizziness All other systems reviewed and are otherwise negative except as noted above.    Blood pressure 132/74, pulse 70, height 5\' 4"  (1.626 m), weight 160 lb 9.6 oz (72.8 kg).  General appearance: alert and no distress Neck: no adenopathy, no carotid bruit, no JVD, supple, symmetrical, trachea midline and thyroid not enlarged, symmetric, no tenderness/mass/nodules Lungs: clear to auscultation bilaterally Heart: regular rate and rhythm, S1, S2 normal, no murmur, click, rub or gallop Extremities: extremities normal, atraumatic, no cyanosis or edema  EKG sinus rhythm at 70 without ST or T-wave changes. There was poor R-wave progression. I presently reviewed this EKG  ASSESSMENT AND PLAN:   Occlusion and stenosis of carotid artery without mention of cerebral infarction History of carotid artery disease with known high-grade left ICA stenosis. The patient has declined revascularization in the past.  Essential hypertension History of hypertension with blood pressure measured today at 180/74. She is on amlodipine, clonidine and hydralazine. Continue current meds are current dosing  Hyperlipidemia History of hyperlipidemia on statin therapy and Zetia followed by her PCP. We will check a lipid liver profile.  Peripheral arterial  disease History of peripheral arterial disease with known SFA disease as well as severe tibial disease although she really denies claudication. She is legally blind and does not ambulate much. There is no evidence of critical limb ischemia.      Kaitlin GessJonathan J. Lia Vigilante MD FACP,FACC,FAHA, [facility] 01/24/2017 11:42 AM

## 2017-01-24 NOTE — Patient Instructions (Signed)
Dr Allyson SabalBerry recommends that you continue on your current medications as directed. Please refer to the Current Medication list given to you today.  Your physician recommends that you return for lab work at your earliest convenience - FASTING.  Dr Allyson SabalBerry recommends that you schedule a follow-up appointment in 12 months. You will receive a reminder letter in the mail two months in advance. If you don't receive a letter, please call our office to schedule the follow-up appointment.  If you need a refill on your cardiac medications before your next appointment, please call your pharmacy.

## 2017-01-24 NOTE — Telephone Encounter (Signed)
[facility] esc[facility]ram 90 day supply.  Refilled.

## 2017-01-24 NOTE — Assessment & Plan Note (Signed)
History of carotid artery disease with known high-grade left ICA stenosis. The patient has declined revascularization in the past.

## 2017-01-24 NOTE — Assessment & Plan Note (Signed)
History of peripheral arterial disease with known SFA disease as well as severe tibial disease although she really denies claudication. She is legally blind and does not ambulate much. There is no evidence of critical limb ischemia.

## 2017-01-24 NOTE — Assessment & Plan Note (Signed)
History of hyperlipidemia on statin therapy and Zetia followed by her PCP. We will check a lipid liver profile.

## 2017-01-27 ENCOUNTER — Ambulatory Visit: Payer: Federal, State, Local not specified - PPO | Admitting: Neurology

## 2017-01-27 ENCOUNTER — Other Ambulatory Visit: Payer: Self-pay | Admitting: *Deleted

## 2017-01-27 MED ORDER — PANTOPRAZOLE SODIUM 40 MG PO TBEC: 40.0000 mg | DELAYED_RELEASE_TABLET | Freq: Every day | ORAL | 3 refills | Status: DC

## 2017-01-31 NOTE — Addendum Note (Signed)
Addended by: Beryle QuantBENSHINE, TARA L on: 01/31/2017 09:27 AM   Modules accepted: Orders

## 2017-02-08 ENCOUNTER — Telehealth: Payer: Self-pay | Admitting: Cardiovascular Disease

## 2017-02-08 NOTE — Telephone Encounter (Signed)
Returned call to patient, spoke with Kaitlin AreolaDeborah Smith (niece, POA)  Patient is blind and totally dependent on niece Kaitlin PoundDeborah for care. Patient is in a litigation case in [place]. The ADA is working with them so she won't have to go to [place] to testify, and ADA will come to her instead. She needs a letter stating d/t her medical condition she cannot travel in order for ADA to allow this. Niece also states the following as medical reasoning for not being able to travel: patient continues to have issues early in AM (1-2am) when BP spikes - 170+/100+  and niece Kaitlin PoundDeborah knows how to manage patient to get BP back under control   A letter needs to be faxed to: [phone] ADA Cornelia CopaChristine Burke  This needs to be completed this week if possible.   Routed to MD

## 2017-02-08 NOTE — Telephone Encounter (Signed)
That letter needs to come from her PCP

## 2017-02-08 NOTE — Telephone Encounter (Signed)
New Message     Pt needs a letter from Dr berry saying she is not allowed to travel

## 2017-02-08 NOTE — Telephone Encounter (Signed)
Spoke with pt niece, Gavin PoundDeborah. She stated that ADA is needing letter from all of her doctors whether she is cleared for travel or not. Just need to know if pt ok to travel from cardiac standpoint. Will send letter to number provided. Need advisement from Dr. Allyson SabalBerry first.

## 2017-02-08 NOTE — Telephone Encounter (Signed)
[id number] 

## 2017-02-13 NOTE — Telephone Encounter (Signed)
I see patient for peripheral arterial disease and this should not limit her ability to travel

## 2017-02-14 ENCOUNTER — Encounter: Payer: Self-pay | Admitting: Cardiovascular Disease

## 2017-02-14 NOTE — Telephone Encounter (Signed)
Letter mailed to ADA at fax number listed below with advisement given by Dr. Allyson SabalBerry.

## 2017-02-20 ENCOUNTER — Other Ambulatory Visit: Payer: Self-pay | Admitting: Cardiovascular Disease

## 2017-02-20 DIAGNOSIS — Z8673 Personal history of transient ischemic attack (TIA), and cerebral infarction without residual deficits: Secondary | ICD-10-CM

## 2017-02-20 NOTE — Telephone Encounter (Signed)
Rx request sent to pharmacy.  

## 2017-02-26 ENCOUNTER — Other Ambulatory Visit: Payer: Self-pay | Admitting: Cardiovascular Disease

## 2017-03-04 ENCOUNTER — Other Ambulatory Visit: Payer: Self-pay | Admitting: Cardiovascular Disease

## 2017-03-04 DIAGNOSIS — Z8673 Personal history of transient ischemic attack (TIA), and cerebral infarction without residual deficits: Secondary | ICD-10-CM

## 2017-03-06 NOTE — Telephone Encounter (Signed)
Rx(s) sent to pharmacy electronically.  

## 2017-03-07 ENCOUNTER — Ambulatory Visit: Payer: Federal, State, Local not specified - PPO | Admitting: Podiatry

## 2017-03-15 ENCOUNTER — Encounter: Payer: Self-pay | Admitting: Podiatry

## 2017-03-15 ENCOUNTER — Ambulatory Visit (INDEPENDENT_AMBULATORY_CARE_PROVIDER_SITE_OTHER): Payer: Federal, State, Local not specified - PPO | Admitting: Podiatry

## 2017-03-15 DIAGNOSIS — B351 Tinea unguium: Secondary | ICD-10-CM

## 2017-03-15 DIAGNOSIS — E1151 Type 2 diabetes mellitus with diabetic peripheral angiopathy without gangrene: Secondary | ICD-10-CM

## 2017-03-15 DIAGNOSIS — M79676 Pain in unspecified toe(s): Secondary | ICD-10-CM

## 2017-03-15 NOTE — Patient Instructions (Signed)
Okay to purchase a heel protector to wear in the right foot when seated and sleeping  Diabetes and Foot Care Diabetes may cause you to have problems because of poor blood supply (circulation) to your feet and legs. This may cause the skin on your feet to become thinner, break easier, and heal more slowly. Your skin may become dry, and the skin may peel and crack. You may also have nerve damage in your legs and feet causing decreased feeling in them. You may not notice minor injuries to your feet that could lead to infections or more serious problems. Taking care of your feet is one of the most important things you can do for yourself. Follow these instructions at home:  Wear shoes at all times, even in the house. Do not go barefoot. Bare feet are easily injured.  Check your feet daily for blisters, cuts, and redness. If you cannot see the bottom of your feet, use a mirror or ask someone for help.  Wash your feet with warm water (do not use hot water) and mild soap. Then pat your feet and the areas between your toes until they are completely dry. Do not soak your feet as this can dry your skin.  Apply a moisturizing lotion or petroleum jelly (that does not contain alcohol and is unscented) to the skin on your feet and to dry, brittle toenails. Do not apply lotion between your toes.  Trim your toenails straight across. Do not dig under them or around the cuticle. File the edges of your nails with an emery board or nail file.  Do not cut corns or calluses or try to remove them with medicine.  Wear clean socks or stockings every day. Make sure they are not too tight. Do not wear knee-high stockings since they may decrease blood flow to your legs.  Wear shoes that fit properly and have enough cushioning. To break in new shoes, wear them for just a few hours a day. This prevents you from injuring your feet. Always look in your shoes before you put them on to be sure there are no objects inside.  Do  not cross your legs. This may decrease the blood flow to your feet.  If you find a minor scrape, cut, or break in the skin on your feet, keep it and the skin around it clean and dry. These areas may be cleansed with mild soap and water. Do not cleanse the area with peroxide, alcohol, or iodine.  When you remove an adhesive bandage, be sure not to damage the skin around it.  If you have a wound, look at it several times a day to make sure it is healing.  Do not use heating pads or hot water bottles. They may burn your skin. If you have lost feeling in your feet or legs, you may not know it is happening until it is too late.  Make sure your health care provider performs a complete foot exam at least annually or more often if you have foot problems. Report any cuts, sores, or bruises to your health care provider immediately. Contact a health care provider if:  You have an injury that is not healing.  You have cuts or breaks in the skin.  You have an ingrown nail.  You notice redness on your legs or feet.  You feel burning or tingling in your legs or feet.  You have pain or cramps in your legs and feet.  Your legs or feet  are numb.  Your feet always feel cold. Get help right away if:  There is increasing redness, swelling, or pain in or around a wound.  There is a red line that goes up your leg.  Pus is coming from a wound.  You develop a fever or as directed by your health care provider.  You notice a bad smell coming from an ulcer or wound. This information is not intended to replace advice given to you by your health care provider. Make sure you discuss any questions you have with your health care provider. Document Released: 09/02/2000 Document Revised: 02/11/2016 Document Reviewed: 02/12/2013 Elsevier Interactive Patient Education  2017 [employer].

## 2017-03-15 NOTE — Progress Notes (Signed)
Patient ID: Ascencion DikeLannie Melin, female   DOB: [birth year], 83 y.o.   MRN: [id number]    Subjective: As patient presents for scheduled visit complaining that her toenails are elongated and thickened and uncomfortable walking wearing shoes and request toenail debridement Patient's niece Is present in treatment room  Objective: Orientated 3 Nonpitting edema bilaterally DP and PT pulses 0/4 bilaterally Capillary reflex delayed bilaterally Sensation to 10 g monofilament wire intact 4/5 right 5/5 left Vibratory sensation reactive bilaterally No open skin lesions bilaterally Atrophic skin  with absent hair growthbilaterally Callus lateral right heel The toenails are hypertrophic, elongated, discolored, deformed and tender to direct palpation 6-10 Patient walks slowly with walker Daniel motor testing dorsi flexion, plantar flexion 5/5 bilaterally  Assessment: Symptomatic onychomycoses 6-10 Diabetic with a history of peripheral arterial disease  Plan: Debridement toenails 6-10 mechanically and electrically without anybleeding Suggest heel protector for right heel  Reappoint 3 months

## 2017-04-20 ENCOUNTER — Other Ambulatory Visit: Payer: Self-pay | Admitting: Cardiovascular Disease

## 2017-05-19 ENCOUNTER — Other Ambulatory Visit: Payer: Self-pay

## 2017-05-19 ENCOUNTER — Emergency Department (HOSPITAL_COMMUNITY)
Admission: EM | Admit: 2017-05-19 | Discharge: 2017-05-19 | Disposition: A | Discharge status: Home / Self Care | Payer: Federal, State, Local not specified - PPO | Attending: Emergency Medicine | Admitting: Emergency Medicine

## 2017-05-19 ENCOUNTER — Encounter (HOSPITAL_COMMUNITY): Payer: Self-pay | Admitting: Emergency Medicine

## 2017-05-19 ENCOUNTER — Other Ambulatory Visit (HOSPITAL_COMMUNITY): Payer: Self-pay

## 2017-05-19 ENCOUNTER — Emergency Department (HOSPITAL_COMMUNITY): Payer: Federal, State, Local not specified - PPO

## 2017-05-19 DIAGNOSIS — Z87891 Personal history of nicotine dependence: Secondary | ICD-10-CM | POA: Diagnosis not present

## 2017-05-19 DIAGNOSIS — I2510 Atherosclerotic heart disease of native coronary artery without angina pectoris: Secondary | ICD-10-CM | POA: Insufficient documentation

## 2017-05-19 DIAGNOSIS — R072 Precordial pain: Secondary | ICD-10-CM | POA: Diagnosis not present

## 2017-05-19 DIAGNOSIS — E119 Type 2 diabetes mellitus without complications: Secondary | ICD-10-CM | POA: Diagnosis not present

## 2017-05-19 DIAGNOSIS — Z79899 Other long term (current) drug therapy: Secondary | ICD-10-CM | POA: Insufficient documentation

## 2017-05-19 DIAGNOSIS — I10 Essential (primary) hypertension: Secondary | ICD-10-CM | POA: Insufficient documentation

## 2017-05-19 DIAGNOSIS — Z794 Long term (current) use of insulin: Secondary | ICD-10-CM | POA: Insufficient documentation

## 2017-05-19 LAB — BASIC METABOLIC PANEL
Anion gap: 8 (ref 5–15)
BUN: 12 mg/dL (ref 6–20)
CO2: 25 mmol/L (ref 22–32)
Calcium: 9.4 mg/dL (ref 8.9–10.3)
Chloride: 105 mmol/L (ref 101–111)
Creatinine, Ser: 0.72 mg/dL (ref 0.44–1.00)
GFR calc Af Amer: >60 mL/min (ref >60)
GLUCOSE: 119 mg/dL — AB (ref 65–99)
POTASSIUM: 4.8 mmol/L (ref 3.5–5.1)
Sodium: 138 mmol/L (ref 135–145)

## 2017-05-19 LAB — CBC
HCT: 43.7 % (ref 36.0–46.0)
HEMOGLOBIN: 15.4 g/dL — AB (ref 12.0–15.0)
MCH: 31.7 pg (ref 26.0–34.0)
MCHC: 35.2 g/dL (ref 30.0–36.0)
MCV: 89.9 fL (ref 78.0–100.0)
PLATELETS: 199 10*3/uL (ref 150–400)
RBC: 4.86 MIL/uL (ref 3.87–5.11)
RDW: 12.7 % (ref 11.5–15.5)
WBC: 8.5 10*3/uL (ref 4.0–10.5)

## 2017-05-19 LAB — I-STAT BETA HCG BLOOD, ED (NOT ORDERABLE): I-stat hCG, quantitative: 8.8 m[IU]/mL — ABNORMAL HIGH (ref <5)

## 2017-05-19 LAB — TROPONIN I
Troponin I: 0.04 ng/mL (ref <0.03)
Troponin I: 0.07 ng/mL (ref <0.03)

## 2017-05-19 LAB — POCT I-STAT TROPONIN I: TROPONIN I, POC: 0.04 ng/mL (ref 0.00–0.08)

## 2017-05-19 NOTE — ED Notes (Signed)
Lab called regarding BMP stating that not enough blood to perform test and will come re-draw. RE-order placed.

## 2017-05-19 NOTE — ED Notes (Signed)
Blood draw unsuccessful 

## 2017-05-19 NOTE — ED Notes (Signed)
Main lab called for blood draw 

## 2017-05-19 NOTE — ED Provider Notes (Signed)
[facility]-EMERGENCY DEPT Provider Note   CSN: [id number] Arrival date & time: 05/19/17  4098     History   Chief Complaint Chief Complaint  Patient presents with  . Chest Pain  . Hypertension    HPI Kaitlin Smith is a 83 y.o. female.  HPI Patient presents to the emergency department reporting transient pain in her upper back and chest this morning.  She reports some chest tightness and called her daughter who brought her to the emergency department.  Blood pressure that time was 201/102 per.her.  Her daughter gave her morning meds and brought to the ER.  She no longer has chest pain or back pain at this time.  She feels good.  No fevers or chills.  No productive cough.   Past Medical History:  Diagnosis Date  . Allergy   . Arthritis   . Blindness   . Coronary artery disease   . Diabetes mellitus without complication (HCC)   . Glaucoma   . Hyperlipidemia   . Hypertension   . Myocardial infarction (HCC)   . Peripheral vascular disease (HCC)   . Stroke [facility])     Patient Active Problem List   Diagnosis Date Noted  . MCI (mild cognitive impairment) 06/30/2015  . Essential hypertension 08/20/2014  . Hyperlipidemia 08/20/2014  . Peripheral arterial disease (HCC) 08/20/2014  . Occlusion and stenosis of carotid artery without mention of cerebral infarction 06/06/2014  . Uncontrolled diabetes mellitus type 2 without complications (HCC) 05/13/2014  . History of CVA (cerebrovascular accident) 05/13/2014  . History of MI (myocardial infarction) 05/13/2014    Past Surgical History:  Procedure Laterality Date  . EYE SURGERY      OB History    No data available       Home Medications    Prior to Admission medications   Medication Sig Start Date End Date Taking? Authorizing Provider  acetaminophen (TYLENOL) 325 MG tablet Take 650 mg by mouth every 6 (six) hours as needed for moderate pain.   Yes [provider]  amLODipine (NORVASC) 10 MG tablet TAKE 1 TABLET (10  MG TOTAL) BY MOUTH DAILY. 04/20/17  Yes Runell Gess, MD  cloNIDine (CATAPRES) 0.2 MG tablet Take 1 tablet (0.2 mg total) by mouth daily. 01/21/16  Yes Runell Gess, MD  clopidogrel (PLAVIX) 75 MG tablet TAKE 1 TABLET (75 MG TOTAL) BY MOUTH DAILY. 03/06/17  Yes Runell Gess, MD  ezetimibe (ZETIA) 10 MG tablet TAKE 1 TABLET BY MOUTH EVERY DAY 02/20/17  Yes Runell Gess, MD  hydrALAZINE (APRESOLINE) 10 MG tablet TAKE 1 TABLET BY MOUTH TWICE A DAY 02/27/17  Yes Runell Gess, MD  insulin glargine (LANTUS) 100 UNIT/ML injection Inject 0-0.1 mLs (0-10 Units total) into the skin at bedtime. Sliding scale Patient taking differently: Inject 10 Units into the skin at bedtime. Sliding scale 11/12/15  Yes Holland Commons A, NP  LUMIGAN 0.01 % SOLN Place 1 drop into the left eye at bedtime.  08/11/14  Yes [provider]  Multiple Vitamin (MULTIVITAMIN WITH MINERALS) TABS tablet Take 1 tablet by mouth daily. One-A-Day   Yes [provider]  pantoprazole (PROTONIX) 40 MG tablet Take 1 tablet (40 mg total) by mouth daily. 01/27/17  Yes Runell Gess, MD  SIMBRINZA 1-0.2 % SUSP Place 1 drop into the left eye at bedtime.  08/11/14  Yes [provider]  simvastatin (ZOCOR) 20 MG tablet TAKE 1 TABLET BY MOUTH AT BEDTIME 02/20/17  Yes Nanetta Batty  J, MD  Vitamin D, Ergocalciferol, (DRISDOL) 50000 units CAPS capsule Take 50,000 Units by mouth once a week. 05/03/17  Yes [provider]  VOLTAREN 1 % GEL APPLY 4 GRAMS TOPICALLY 2 TIMES DAILY AS NEEDED. 04/25/16  Yes Runell Gess, MD  [employer] STOOL SOFTENER 100 MG capsule TAKE ONE CAPSULE BY MOUTH EVERY DAY 08/30/16   Quentin Angst, MD  FREESTYLE LITE test strip CHECK 3 TIMES PER DAY 05/17/16   Jegede, Olugbemiga E, MD  hydrALAZINE (APRESOLINE) 10 MG tablet TAKE 1 TABLET BY MOUTH TWICE A DAY Patient not taking: Reported on 05/19/2017 05/04/16   Quentin Angst, MD  insulin lispro (HUMALOG) 100 UNIT/ML injection  INJECT 0.1 MILLILITERS (10 UNITS) INTO THE SKIN 3 TIMES A DAY WITH MEALS Patient not taking: Reported on 05/19/2017 10/29/16   Morrell Riddle, PA-C  Insulin Syringe-Needle U-100 (BD INSULIN SYRINGE ULTRAFINE) 31G X 15/64" 0.5 ML MISC Take insulin once nightly as needed 06/10/15   [name], NP  Lancets (FREESTYLE) lancets Check 3 times per day for E11.9 01/16/15   Holland Commons A, NP  polyethylene glycol powder (GLYCOLAX/MIRALAX) powder Take 17 g by mouth daily. Patient not taking: Reported on 05/19/2017 01/16/15   [name], NP    Family History Family History  Problem Relation Age of Onset  . Diabetes Mother   . Stroke Mother   . Peripheral vascular disease Mother        amputation  . Cancer Brother   . Diabetes Brother   . Alcohol abuse Brother   . Varicose Veins Brother   . Hypertension Father   . Varicose Veins Father   . Heart attack Father   . Cancer Father   . Diabetes Sister   . Hypertension Sister     Social History Social History  Substance Use Topics  . Smoking status: Former Smoker    Types: Cigarettes    Quit date: 06/06/2002  . Smokeless tobacco: Never Used     Comment: Quit at age 73  . Alcohol use No     Allergies   Milk-related compounds   Review of Systems Review of Systems  All other systems reviewed and are negative.    Physical Exam Updated Vital Signs BP (!) 188/82   Pulse 81   Temp 98.1 F (36.7 C) (Oral)   Resp 16   SpO2 99%   Physical Exam  Constitutional: She is oriented to person, place, and time. She appears well-developed and well-nourished. No distress.  HENT:  Head: Normocephalic and atraumatic.  Eyes: EOM are normal.  Neck: Normal range of motion.  Cardiovascular: Normal rate, regular rhythm and normal heart sounds.   Pulmonary/Chest: Effort normal and breath sounds normal.  Abdominal: Soft. She exhibits no distension. There is no tenderness.  Musculoskeletal: Normal range of motion.  Neurological: She is alert  and oriented to person, place, and time.  Skin: Skin is warm and dry.  Psychiatric: She has a normal mood and affect. Judgment normal.  Nursing note and vitals reviewed.    ED Treatments / Results  Labs (all labs ordered are listed, but only abnormal results are displayed) Labs Reviewed  CBC - Abnormal; Notable for the following:       Result Value   Hemoglobin 15.4 (*)    All other components within normal limits  TROPONIN I - Abnormal; Notable for the following:    Troponin I 0.04 (*)    All other components within normal limits  BASIC METABOLIC PANEL - Abnormal; Notable for the following:    Glucose, Bld 119 (*)    All other components within normal limits  I-STAT BETA HCG BLOOD, ED (NOT ORDERABLE) - Abnormal; Notable for the following:    I-stat hCG, quantitative 8.8 (*)    All other components within normal limits  TROPONIN I  I-STAT TROPONIN, ED  POCT I-STAT TROPONIN I    EKG  EKG Interpretation  Date/Time:  Friday May 19 2017 07:06:28 EDT Ventricular Rate:  85 PR Interval:    QRS Duration: 96 QT Interval:  368 QTC Calculation: 438 R Axis:   -26 Text Interpretation:  Sinus rhythm Prolonged PR interval LVH with secondary repolarization abnormality ST T wave changes increased anterolaterally since last tracing Confirmed by Linwood DibblesKnapp, Jon [phone]([id number]) on 05/19/2017 7:30:57 AM       Radiology Dg Chest 2 View  Result Date: 05/19/2017 CLINICAL DATA:  Chest tightness.  Elevated blood pressure. EXAM: CHEST  2 VIEW COMPARISON:  06/14/2014 FINDINGS: Normal heart size. Aortic atherosclerosis. No pleural effusion or edema identified. No airspace opacities. Spondylosis noted within the thoracic spine. IMPRESSION: 1. No acute cardiopulmonary abnormalities. 2.  Aortic Atherosclerosis (ICD10-I70.0). Electronically Signed   By: Signa Kellaylor  Stroud M.D.   On: 05/19/2017 08:17    Procedures Procedures (including critical care time)  Medications Ordered in ED Medications - No data to  display   Initial Impression / Assessment and Plan / ED Course  I have reviewed the triage vital signs and the nursing notes.  Pertinent labs & imaging results that were available during my care of the patient were reviewed by me and considered in my medical decision making (see chart for details).     Chest pain-free while in the emergency department.  EKG without significant abnormalities.  Initial troponin is 0.4.  Second troponin is 0.04.  She has been in the emergency department for just over 10 hours and has had no active pain while in the ER.  I do not think this acute coronary syndrome.  I think the patient can safely be discharged home to follow-up with her primary care physician.  She and her daughter understand to return to the ER for new or worsening symptoms  Final Clinical Impressions(s) / ED Diagnoses   Final diagnoses:  Precordial pain    New Prescriptions New Prescriptions   No medications on file     Azalia Bilisampos, Noelie Renfrow, MD 05/19/17 [phone]

## 2017-05-19 NOTE — ED Notes (Signed)
Main lab phlebotomist at bedside for blood draw.

## 2017-05-19 NOTE — ED Notes (Signed)
Bed: WA12 Expected date:  Expected time:  Means of arrival:  Comments: Triage 2 

## 2017-05-19 NOTE — ED Notes (Signed)
ED Provider at bedside. 

## 2017-05-19 NOTE — ED Triage Notes (Signed)
Pt states she woke up this morning feeling "funny" in her head then she started to hurt in her back  Pt states she had some chest tightness   Her daughter states she took her B/P and it was 201/102 so she gave her morning meds of clonidine and hydralazine  Pt states she does not have any pain now but earlier had pain between her shoulder blades

## 2017-06-12 ENCOUNTER — Ambulatory Visit (INDEPENDENT_AMBULATORY_CARE_PROVIDER_SITE_OTHER): Payer: Federal, State, Local not specified - PPO | Admitting: Podiatry

## 2017-06-12 ENCOUNTER — Encounter: Payer: Self-pay | Admitting: Podiatry

## 2017-06-12 DIAGNOSIS — B351 Tinea unguium: Secondary | ICD-10-CM

## 2017-06-12 DIAGNOSIS — I739 Peripheral vascular disease, unspecified: Secondary | ICD-10-CM | POA: Diagnosis not present

## 2017-06-12 DIAGNOSIS — M79676 Pain in unspecified toe(s): Secondary | ICD-10-CM

## 2017-06-12 NOTE — Progress Notes (Signed)
Patient ID: [name], female   DOB: March 31, 1930, 83 y.o.   MRN: [id number]     Subjective: As patient presents for scheduled visit complaining that her toenails are elongated and thickened and uncomfortable walking wearing shoes and request toenail debridement Patient's niece Is present in treatment room  Objective: Orientated 3 Nonpitting edema bilaterally DP and PT pulses 0/4 bilaterally Capillary reflex delayed bilaterally Sensation to 10 g monofilament wire intact 4/5 right 5/5 left Vibratory sensation reactive bilaterally No open skin lesions bilaterally Atrophic skin  with absent hair growthbilaterally Callus lateral right heel The toenails are hypertrophic, elongated, discolored, deformed and tender to direct palpation 6-10 Patient walks slowly with walker Manual motor testing dorsi flexion, plantar flexion 5/5 bilaterally  Assessment: Symptomatic onychomycoses 6-10 Diabetic with a history of peripheral arterial disease  Plan: Debridement toenails 6-10 mechanically and electrically without anybleeding   Reappoint 3 months

## 2017-06-12 NOTE — Patient Instructions (Signed)

## 2017-06-14 ENCOUNTER — Ambulatory Visit: Payer: Federal, State, Local not specified - PPO | Admitting: Podiatry

## 2017-07-16 ENCOUNTER — Other Ambulatory Visit: Payer: Self-pay | Admitting: Cardiovascular Disease

## 2017-07-17 NOTE — Telephone Encounter (Signed)
REFILL 

## 2017-09-25 ENCOUNTER — Encounter: Payer: Self-pay | Admitting: Podiatry

## 2017-09-25 ENCOUNTER — Ambulatory Visit: Payer: Federal, State, Local not specified - PPO | Admitting: Podiatry

## 2017-09-25 DIAGNOSIS — M79675 Pain in left toe(s): Secondary | ICD-10-CM | POA: Diagnosis not present

## 2017-09-25 DIAGNOSIS — E1151 Type 2 diabetes mellitus with diabetic peripheral angiopathy without gangrene: Secondary | ICD-10-CM | POA: Diagnosis not present

## 2017-09-25 DIAGNOSIS — B351 Tinea unguium: Secondary | ICD-10-CM | POA: Diagnosis not present

## 2017-09-25 DIAGNOSIS — M79674 Pain in right toe(s): Secondary | ICD-10-CM

## 2017-09-27 NOTE — Progress Notes (Signed)
Subjective:   Patient ID: Kaitlin Smith, female   DOB: 84 y.o.   MRN: [id number]   HPI Patient presents with nail disease 1-5 both feet and also a small area they were concerned about on the right leg near the knee   ROS      Objective:  Physical Exam  Thickened yellow brittle nailbeds 1-5 both feet with a small lesion right upper leg near the knee that has been present recently.  The nails are thickened and painful     Assessment:  Localized lesion right leg along with mycotic nail infection 1-5 both feet     Plan:  Debride painful nailbeds 1-5 both feet with no iatrogenic bleeding and for the lesion I recommended seeing a dermatologist and they will make an appointment

## 2017-10-14 ENCOUNTER — Emergency Department (HOSPITAL_COMMUNITY)
Admission: EM | Admit: 2017-10-14 | Discharge: 2017-10-14 | Disposition: A | Discharge status: Home / Self Care | Payer: Federal, State, Local not specified - PPO | Attending: Emergency Medicine | Admitting: Emergency Medicine

## 2017-10-14 ENCOUNTER — Encounter (HOSPITAL_COMMUNITY): Payer: Self-pay | Admitting: Emergency Medicine

## 2017-10-14 DIAGNOSIS — R338 Other retention of urine: Secondary | ICD-10-CM

## 2017-10-14 DIAGNOSIS — I10 Essential (primary) hypertension: Secondary | ICD-10-CM | POA: Diagnosis not present

## 2017-10-14 DIAGNOSIS — Z79899 Other long term (current) drug therapy: Secondary | ICD-10-CM | POA: Insufficient documentation

## 2017-10-14 DIAGNOSIS — R339 Retention of urine, unspecified: Secondary | ICD-10-CM | POA: Diagnosis not present

## 2017-10-14 DIAGNOSIS — Z7902 Long term (current) use of antithrombotics/antiplatelets: Secondary | ICD-10-CM | POA: Insufficient documentation

## 2017-10-14 DIAGNOSIS — Z87891 Personal history of nicotine dependence: Secondary | ICD-10-CM | POA: Insufficient documentation

## 2017-10-14 DIAGNOSIS — E119 Type 2 diabetes mellitus without complications: Secondary | ICD-10-CM | POA: Diagnosis not present

## 2017-10-14 DIAGNOSIS — Z794 Long term (current) use of insulin: Secondary | ICD-10-CM | POA: Diagnosis not present

## 2017-10-14 DIAGNOSIS — N390 Urinary tract infection, site not specified: Secondary | ICD-10-CM | POA: Insufficient documentation

## 2017-10-14 DIAGNOSIS — I2510 Atherosclerotic heart disease of native coronary artery without angina pectoris: Secondary | ICD-10-CM | POA: Insufficient documentation

## 2017-10-14 LAB — BASIC METABOLIC PANEL
ANION GAP: 9 (ref 5–15)
BUN: 8 mg/dL (ref 6–20)
CHLORIDE: 103 mmol/L (ref 101–111)
CO2: 26 mmol/L (ref 22–32)
Calcium: 9.4 mg/dL (ref 8.9–10.3)
Creatinine, Ser: 0.6 mg/dL (ref 0.44–1.00)
GFR calc Af Amer: >60 mL/min (ref >60)
GFR calc non Af Amer: >60 mL/min (ref >60)
GLUCOSE: 107 mg/dL — AB (ref 65–99)
POTASSIUM: 4 mmol/L (ref 3.5–5.1)
Sodium: 138 mmol/L (ref 135–145)

## 2017-10-14 LAB — URINALYSIS, ROUTINE W REFLEX MICROSCOPIC
BILIRUBIN URINE: NEGATIVE
Bacteria, UA: NONE SEEN
Glucose, UA: NEGATIVE mg/dL
Hgb urine dipstick: NEGATIVE
Ketones, ur: NEGATIVE mg/dL
Nitrite: NEGATIVE
PH: 6 (ref 5.0–8.0)
Protein, ur: NEGATIVE mg/dL
SQUAMOUS EPITHELIAL / LPF: NONE SEEN
Specific Gravity, Urine: 1.016 (ref 1.005–1.030)

## 2017-10-14 LAB — CBC
HEMATOCRIT: 42.9 % (ref 36.0–46.0)
HEMOGLOBIN: 14.7 g/dL (ref 12.0–15.0)
MCH: 32.2 pg (ref 26.0–34.0)
MCHC: 34.3 g/dL (ref 30.0–36.0)
MCV: 94.1 fL (ref 78.0–100.0)
PLATELETS: 229 10*3/uL (ref 150–400)
RBC: 4.56 MIL/uL (ref 3.87–5.11)
RDW: 12.9 % (ref 11.5–15.5)
WBC: 6.4 10*3/uL (ref 4.0–10.5)

## 2017-10-14 MED ORDER — DEXTROSE 5 % IV SOLN
1.0000 g | Freq: Once | INTRAVENOUS | Status: AC
  Administered 2017-10-14: 1 g via INTRAVENOUS
  Filled 2017-10-14: qty 10

## 2017-10-14 MED ORDER — CEPHALEXIN 500 MG PO CAPS: 500.0000 mg | ORAL_CAPSULE | Freq: Four times a day (QID) | ORAL | 0 refills | Status: DC

## 2017-10-14 NOTE — ED Notes (Signed)
Blood draw x2 attempted; RN to insert US IV for labs.

## 2017-10-14 NOTE — ED Notes (Signed)
Full education to patients daughter on foley care and the changing to leg bag and emptying. Patient daughter repeat demonstration.

## 2017-10-14 NOTE — ED Triage Notes (Signed)
Patient had dark colored for 3-4 days, and since last night hasnt had any urine. Feels like she needs to go but nothing come out. Denies any dysuria with dark urine.

## 2017-10-14 NOTE — Discharge Instructions (Signed)
Contact a health care provider if: °You develop a low-grade fever. °You experience spasms or leakage of urine with the spasms. °Get help right away if: °You develop chills or fever. °Your catheter stops draining urine. °Your catheter falls out. °You start to develop increased bleeding that does not respond to rest and increased fluid intake. °

## 2017-10-14 NOTE — ED Provider Notes (Signed)
Medical screening examination/treatment/procedure(s) were conducted as a shared visit with non-physician practitioner(s) and myself.  I personally evaluated the patient during the encounter.   EKG Interpretation None     84 year old female presents with dark urine times several days.  Also has some pubic pressure and bladder scan showed 875 cc of urine.  Foley catheter placed.  Urine positive for infection.  Will place on antibiotics and give urology referral   Lorre NickAllen, [place]ola Dimmitt, MD 10/14/17 1544

## 2017-10-14 NOTE — ED Provider Notes (Signed)
[facility]-EMERGENCY DEPT Provider Note   CSN: [id number] Arrival date & time: 10/14/17  1239     History   Chief Complaint Chief Complaint  Patient presents with  . Urinary Retention    HPI Kaitlin Smith is a 84 y.o. female she is a past medical history of blindness, diabetes, history of stroke and MI.  Patient presents for acute urinary retention.  She states that yesterday she was having some burning with urination.  Overnight she was unable to output urine and now feels urgency and fullness in her bladder.  Initial bladder scan shows retained urine of 875 mL.  She has had this previously in the past with a previous infection.  She is not on any new medications.  She denies fevers or chills.  HPI  Past Medical History:  Diagnosis Date  . Allergy   . Arthritis   . Blindness   . Coronary artery disease   . Diabetes mellitus without complication (HCC)   . Glaucoma   . Hyperlipidemia   . Hypertension   . Myocardial infarction (HCC)   . Peripheral vascular disease (HCC)   . Stroke [facility])     Patient Active Problem List   Diagnosis Date Noted  . MCI (mild cognitive impairment) 06/30/2015  . Essential hypertension 08/20/2014  . Hyperlipidemia 08/20/2014  . Peripheral arterial disease (HCC) 08/20/2014  . Occlusion and stenosis of carotid artery without mention of cerebral infarction 06/06/2014  . Uncontrolled diabetes mellitus type 2 without complications (HCC) 05/13/2014  . History of CVA (cerebrovascular accident) 05/13/2014  . History of MI (myocardial infarction) 05/13/2014    Past Surgical History:  Procedure Laterality Date  . EYE SURGERY      OB History    No data available       Home Medications    Prior to Admission medications   Medication Sig Start Date End Date Taking? Authorizing Provider  acetaminophen (TYLENOL) 325 MG tablet Take 650 mg by mouth every 6 (six) hours as needed for moderate pain.    [provider]    amLODipine (NORVASC) 10 MG tablet TAKE 1 TABLET (10 MG TOTAL) BY MOUTH DAILY. 07/17/17   Runell Gess, MD  cloNIDine (CATAPRES) 0.2 MG tablet Take 1 tablet (0.2 mg total) by mouth daily. 01/21/16   Runell Gess, MD  clopidogrel (PLAVIX) 75 MG tablet TAKE 1 TABLET (75 MG TOTAL) BY MOUTH DAILY. 03/06/17   Runell Gess, MD  [employer] STOOL SOFTENER 100 MG capsule TAKE ONE CAPSULE BY MOUTH EVERY DAY 08/30/16   Quentin Angst, MD  ezetimibe (ZETIA) 10 MG tablet TAKE 1 TABLET BY MOUTH EVERY DAY 02/20/17   Runell Gess, MD  FREESTYLE LITE test strip CHECK 3 TIMES PER DAY 05/17/16   Quentin Angst, MD  hydrALAZINE (APRESOLINE) 10 MG tablet TAKE 1 TABLET BY MOUTH TWICE A DAY 05/04/16   Quentin Angst, MD  hydrALAZINE (APRESOLINE) 10 MG tablet TAKE 1 TABLET BY MOUTH TWICE A DAY 02/27/17   Runell Gess, MD  insulin glargine (LANTUS) 100 UNIT/ML injection Inject 0-0.1 mLs (0-10 Units total) into the skin at bedtime. Sliding scale Patient taking differently: Inject 10 Units into the skin at bedtime. Sliding scale 11/12/15   [name], NP  insulin lispro (HUMALOG) 100 UNIT/ML injection INJECT 0.1 MILLILITERS (10 UNITS) INTO THE SKIN 3 TIMES A DAY WITH MEALS 10/29/16   Weber, Sarah L, PA-C  Insulin Syringe-Needle U-100 (BD INSULIN SYRINGE ULTRAFINE) 31G  X 15/64" 0.5 ML MISC Take insulin once nightly as needed 06/10/15   Ambrose FinlandKeck, Valerie A, NP  Lancets (FREESTYLE) lancets Check 3 times per day for E11.9 01/16/15   Holland CommonsKeck, Valerie A, NP  LUMIGAN 0.01 % SOLN Place 1 drop into the left eye at bedtime.  08/11/14   [provider]  Multiple Vitamin (MULTIVITAMIN WITH MINERALS) TABS tablet Take 1 tablet by mouth daily. One-A-Day    [provider]  pantoprazole (PROTONIX) 40 MG tablet Take 1 tablet (40 mg total) by mouth daily. 01/27/17   Runell GessBerry, Jonathan J, MD  polyethylene glycol powder (GLYCOLAX/MIRALAX) powder Take 17 g by mouth daily. 01/16/15   Ambrose FinlandKeck, Valerie A, NP   SIMBRINZA 1-0.2 % SUSP Place 1 drop into the left eye at bedtime.  08/11/14   [provider]  simvastatin (ZOCOR) 20 MG tablet TAKE 1 TABLET BY MOUTH AT BEDTIME 02/20/17   Runell GessBerry, Jonathan J, MD  Vitamin D, Ergocalciferol, (DRISDOL) 50000 units CAPS capsule Take 50,000 Units by mouth once a week. 05/03/17   [provider]  VOLTAREN 1 % GEL APPLY 4 GRAMS TOPICALLY 2 TIMES DAILY AS NEEDED. 04/25/16   Runell GessBerry, Jonathan J, MD    Family History Family History  Problem Relation Age of Onset  . Diabetes Mother   . Stroke Mother   . Peripheral vascular disease Mother        amputation  . Cancer Brother   . Diabetes Brother   . Alcohol abuse Brother   . Varicose Veins Brother   . Hypertension Father   . Varicose Veins Father   . Heart attack Father   . Cancer Father   . Diabetes Sister   . Hypertension Sister     Social History Social History   Tobacco Use  . Smoking status: Former Smoker    Types: Cigarettes    Last attempt to quit: 06/06/2002    Years since quitting: 15.3  . Smokeless tobacco: Never Used  . Tobacco comment: Quit at age 84  Substance Use Topics  . Alcohol use: No    Alcohol/week: 0.0 oz  . Drug use: No     Allergies   Milk-related compounds   Review of Systems Review of Systems  Ten systems reviewed and are negative for acute change, except as noted in the HPI.   Physical Exam Updated Vital Signs BP 138/68   Pulse 77   Temp 98.5 F (36.9 C) (Oral)   Resp 13   SpO2 98%   Physical Exam  Constitutional: She is oriented to person, place, and time. She appears well-developed and well-nourished. No distress.  HENT:  Head: Normocephalic and atraumatic.  Eyes: Conjunctivae are normal. No scleral icterus.  Neck: Normal range of motion.  Cardiovascular: Normal rate, regular rhythm and normal heart sounds. Exam reveals no gallop and no friction rub.  No murmur heard. Pulmonary/Chest: Effort normal and breath sounds normal. No respiratory  distress.  Abdominal: Soft. Bowel sounds are normal. She exhibits distension. She exhibits no mass. There is tenderness. There is no guarding.  Suprapubic tenderness and distention of the bladder all the way up to the umbilicus.  Neurological: She is alert and oriented to person, place, and time.  Skin: Skin is warm and dry. She is not diaphoretic.  Psychiatric: Her behavior is normal.  Nursing note and vitals reviewed.    ED Treatments / Results  Labs (all labs ordered are listed, but only abnormal results are displayed) Labs Reviewed  URINALYSIS, ROUTINE  W REFLEX MICROSCOPIC - Abnormal; Notable for the following components:      Result Value   Leukocytes, UA MODERATE (*)    All other components within normal limits  BASIC METABOLIC PANEL - Abnormal; Notable for the following components:   Glucose, Bld 107 (*)    All other components within normal limits  URINE CULTURE  CBC    EKG  EKG Interpretation None       Radiology No results found.  Procedures Procedures (including critical care time)  Medications Ordered in ED Medications - No data to display   Initial Impression / Assessment and Plan / ED Course  I have reviewed the triage vital signs and the nursing notes.  Pertinent labs & imaging results that were available during my care of the patient were reviewed by me and considered in my medical decision making (see chart for details).     Patient with urinary tract infection and acute urinary retention.  She was treated with IV Rocephin here.  Patient will be discharged with p.o. antibiotics and Foley catheter with leg bag.  She is advised to follow closely with urology this week.  She does not appear to be septic.  I reviewed her labs which show no significant abnormalities including normal kidney function.  Patient will be discharged at this time.  Discussed return precautions with the patient and her daughter.  Final Clinical Impressions(s) / ED Diagnoses    Final diagnoses:  Acute urinary retention  Lower urinary tract infectious disease    ED Discharge Orders    None       Arthor Captain, PA-C 10/14/17 2338

## 2017-10-15 LAB — URINE CULTURE: Culture: NO GROWTH

## 2017-11-04 ENCOUNTER — Emergency Department (HOSPITAL_COMMUNITY)
Admission: EM | Admit: 2017-11-04 | Discharge: 2017-11-05 | Disposition: A | Discharge status: Home / Self Care | Payer: Medicare Other | Attending: Emergency Medicine | Admitting: Emergency Medicine

## 2017-11-04 DIAGNOSIS — I2510 Atherosclerotic heart disease of native coronary artery without angina pectoris: Secondary | ICD-10-CM | POA: Insufficient documentation

## 2017-11-04 DIAGNOSIS — I252 Old myocardial infarction: Secondary | ICD-10-CM | POA: Diagnosis not present

## 2017-11-04 DIAGNOSIS — E119 Type 2 diabetes mellitus without complications: Secondary | ICD-10-CM | POA: Insufficient documentation

## 2017-11-04 DIAGNOSIS — Z7901 Long term (current) use of anticoagulants: Secondary | ICD-10-CM | POA: Insufficient documentation

## 2017-11-04 DIAGNOSIS — N390 Urinary tract infection, site not specified: Secondary | ICD-10-CM | POA: Diagnosis not present

## 2017-11-04 DIAGNOSIS — R339 Retention of urine, unspecified: Secondary | ICD-10-CM | POA: Diagnosis not present

## 2017-11-04 DIAGNOSIS — Z87891 Personal history of nicotine dependence: Secondary | ICD-10-CM | POA: Diagnosis not present

## 2017-11-04 DIAGNOSIS — Z79899 Other long term (current) drug therapy: Secondary | ICD-10-CM | POA: Insufficient documentation

## 2017-11-04 DIAGNOSIS — Z794 Long term (current) use of insulin: Secondary | ICD-10-CM | POA: Insufficient documentation

## 2017-11-04 DIAGNOSIS — R338 Other retention of urine: Secondary | ICD-10-CM

## 2017-11-04 DIAGNOSIS — I739 Peripheral vascular disease, unspecified: Secondary | ICD-10-CM | POA: Insufficient documentation

## 2017-11-04 LAB — URINALYSIS, ROUTINE W REFLEX MICROSCOPIC
BILIRUBIN URINE: NEGATIVE
Glucose, UA: NEGATIVE mg/dL
Ketones, ur: NEGATIVE mg/dL
NITRITE: NEGATIVE
PROTEIN: NEGATIVE mg/dL
SPECIFIC GRAVITY, URINE: 1.011 (ref 1.005–1.030)
Squamous Epithelial / LPF: NONE SEEN
pH: 6 (ref 5.0–8.0)

## 2017-11-04 NOTE — ED Triage Notes (Addendum)
Pt recently seen around 10/16/2017 for UTI and subsequent urinary retention. Urinary catheter was placed and patient was sent home. UTI antibiotic regimen completed. Followed up with urology and had catheter removed this past Wednesday. Has been able to void until today. Denies fevers, nausea, diarrhea. Denies pain but c/o pelvic pressure. Has fecal incontinence. Endorses increased oral hydration -gatorade, cranberry juice, and water.  No other c/c. In NAD. Hx HTN, DM, CVA.

## 2017-11-04 NOTE — ED Provider Notes (Signed)
[facility]-EMERGENCY DEPT Provider Note   CSN: [id number] Arrival date & time: 11/04/17  2201     History   Chief Complaint Chief Complaint  Patient presents with  . Urinary Retention    HPI Kaitlin Smith is a 84 y.o. female.  The history is provided by the patient.  She has a history of hypertension, diabetes, hyperlipidemia, cardial infarction, stroke, peripheral vascular disease, blindness and comes in because of inability to urinate.  She had a urinary tract infection recently with urinary retention treated with Foley catheter.  Foley catheter was removed 3 days ago and she was doing well until this morning.  Last time she was able to urinate was this morning.  She denies abdominal pain or flank pain.  She denies fever or chills.  She denies nausea or vomiting.  Past Medical History:  Diagnosis Date  . Allergy   . Arthritis   . Blindness   . Coronary artery disease   . Diabetes mellitus without complication (HCC)   . Glaucoma   . Hyperlipidemia   . Hypertension   . Myocardial infarction (HCC)   . Peripheral vascular disease (HCC)   . Stroke [facility](HCC)     Patient Active Problem List   Diagnosis Date Noted  . MCI (mild cognitive impairment) 06/30/2015  . Essential hypertension 08/20/2014  . Hyperlipidemia 08/20/2014  . Peripheral arterial disease (HCC) 08/20/2014  . Occlusion and stenosis of carotid artery without mention of cerebral infarction 06/06/2014  . Uncontrolled diabetes mellitus type 2 without complications (HCC) 05/13/2014  . History of CVA (cerebrovascular accident) 05/13/2014  . History of MI (myocardial infarction) 05/13/2014    Past Surgical History:  Procedure Laterality Date  . EYE SURGERY      OB History    No data available       Home Medications    Prior to Admission medications   Medication Sig Start Date End Date Taking? Authorizing Provider  acetaminophen (TYLENOL) 325 MG tablet Take 650 mg by mouth every 6 (six)  hours as needed for moderate pain.    [provider]  amLODipine (NORVASC) 10 MG tablet TAKE 1 TABLET (10 MG TOTAL) BY MOUTH DAILY. 07/17/17   Runell GessBerry, Jonathan J, MD  cephALEXin (KEFLEX) 500 MG capsule Take 1 capsule (500 mg total) by mouth 4 (four) times daily. 10/14/17   Arthor CaptainHarris, Abigail, PA-C  cloNIDine (CATAPRES) 0.2 MG tablet Take 1 tablet (0.2 mg total) by mouth daily. 01/21/16   Runell GessBerry, Jonathan J, MD  clopidogrel (PLAVIX) 75 MG tablet TAKE 1 TABLET (75 MG TOTAL) BY MOUTH DAILY. 03/06/17   Runell GessBerry, Jonathan J, MD  [employer] STOOL SOFTENER 100 MG capsule TAKE ONE CAPSULE BY MOUTH EVERY DAY 08/30/16   Quentin AngstJegede, Olugbemiga E, MD  ezetimibe (ZETIA) 10 MG tablet TAKE 1 TABLET BY MOUTH EVERY DAY 02/20/17   Runell GessBerry, Jonathan J, MD  FREESTYLE LITE test strip CHECK 3 TIMES PER DAY 05/17/16   Quentin AngstJegede, Olugbemiga E, MD  hydrALAZINE (APRESOLINE) 10 MG tablet TAKE 1 TABLET BY MOUTH TWICE A DAY 05/04/16   Quentin AngstJegede, Olugbemiga E, MD  hydrALAZINE (APRESOLINE) 10 MG tablet TAKE 1 TABLET BY MOUTH TWICE A DAY 02/27/17   Runell GessBerry, Jonathan J, MD  insulin glargine (LANTUS) 100 UNIT/ML injection Inject 0-0.1 mLs (0-10 Units total) into the skin at bedtime. Sliding scale Patient taking differently: Inject 10 Units into the skin at bedtime. Sliding scale 11/12/15   Ambrose FinlandKeck, Valerie A, NP  insulin lispro (HUMALOG) 100 UNIT/ML injection INJECT 0.1  MILLILITERS (10 UNITS) INTO THE SKIN 3 TIMES A DAY WITH MEALS 10/29/16   Weber, Dema Severin, PA-C  Insulin Syringe-Needle U-100 (BD INSULIN SYRINGE ULTRAFINE) 31G X 15/64" 0.5 ML MISC Take insulin once nightly as needed 06/10/15   [name], NP  Lancets (FREESTYLE) lancets Check 3 times per day for E11.9 01/16/15   Holland Commons A, NP  LUMIGAN 0.01 % SOLN Place 1 drop into the left eye at bedtime.  08/11/14   [provider]  Multiple Vitamin (MULTIVITAMIN WITH MINERALS) TABS tablet Take 1 tablet by mouth daily. One-A-Day    [provider]  pantoprazole (PROTONIX) 40 MG tablet  Take 1 tablet (40 mg total) by mouth daily. 01/27/17   Runell Gess, MD  polyethylene glycol powder (GLYCOLAX/MIRALAX) powder Take 17 g by mouth daily. 01/16/15   [name], NP  SIMBRINZA 1-0.2 % SUSP Place 1 drop into the left eye at bedtime.  08/11/14   [provider]  simvastatin (ZOCOR) 20 MG tablet TAKE 1 TABLET BY MOUTH AT BEDTIME 02/20/17   Runell Gess, MD  Vitamin D, Ergocalciferol, (DRISDOL) 50000 units CAPS capsule Take 50,000 Units by mouth once a week. 05/03/17   [provider]  VOLTAREN 1 % GEL APPLY 4 GRAMS TOPICALLY 2 TIMES DAILY AS NEEDED. 04/25/16   Runell Gess, MD    Family History Family History  Problem Relation Age of Onset  . Diabetes Mother   . Stroke Mother   . Peripheral vascular disease Mother        amputation  . Cancer Brother   . Diabetes Brother   . Alcohol abuse Brother   . Varicose Veins Brother   . Hypertension Father   . Varicose Veins Father   . Heart attack Father   . Cancer Father   . Diabetes Sister   . Hypertension Sister     Social History Social History   Tobacco Use  . Smoking status: Former Smoker    Types: Cigarettes    Last attempt to quit: 06/06/2002    Years since quitting: 15.4  . Smokeless tobacco: Never Used  . Tobacco comment: Quit at age 78  Substance Use Topics  . Alcohol use: No    Alcohol/week: 0.0 oz  . Drug use: No     Allergies   Milk-related compounds   Review of Systems Review of Systems  All other systems reviewed and are negative.    Physical Exam Updated Vital Signs BP (!) 179/88 (BP Location: Right Arm)   Pulse 92   Temp 99.3 F (37.4 C) (Oral)   Ht 5\' 4"  (1.626 m)   Wt 64.4 kg (142 lb)   SpO2 100%   BMI 24.37 kg/m   Physical Exam  Nursing note and vitals reviewed.  84 year old female, resting comfortably and in no acute distress. Vital signs are significant for elevated blood pressure. Oxygen saturation is 100%, which is normal. Head is  normocephalic and atraumatic. PERRLA, EOMI. Oropharynx is clear. Neck is nontender and supple without adenopathy or JVD. Back is nontender and there is no CVA tenderness. Lungs are clear without rales, wheezes, or rhonchi. Chest is nontender. Heart has regular rate and rhythm without murmur. Abdomen is soft and nontender without masses or hepatosplenomegaly and peristalsis is normoactive.  Bladder is distended to the umbilicus. Extremities have no cyanosis or edema, full range of motion is present. Skin is warm and dry without rash. Neurologic: Mental status is normal, she  is blind, but other cranial nerves are intact, there are no motor or sensory deficits.  ED Treatments / Results  Labs (all labs ordered are listed, but only abnormal results are displayed) Labs Reviewed  URINALYSIS, ROUTINE W REFLEX MICROSCOPIC - Abnormal; Notable for the following components:      Result Value   APPearance HAZY (*)    Hgb urine dipstick SMALL (*)    Leukocytes, UA LARGE (*)    Bacteria, UA MANY (*)    All other components within normal limits  URINE CULTURE    Procedures Procedures   Medications Ordered in ED Medications  nitrofurantoin (macrocrystal-monohydrate) (MACROBID) capsule 100 mg (not administered)     Initial Impression / Assessment and Plan / ED Course  I have reviewed the triage vital signs and the nursing notes.  Pertinent lab results that were available during my care of the patient were reviewed by me and considered in my medical decision making (see chart for details).  Acute urinary retention.  Old records are reviewed, and she was seen in the ED on January 26 with urinary tract infection and urinary retention treated with Foley catheter and course of cephalexin (although urine culture actually had no growth).    Bladder scan shows 776 mL urine in the bladder.  Foley catheter is placed.  Urinalysis does show pyuria and bacteria.  Specimen is sent for culture.  Patient's  niece is here and states that when Foley catheter was removed, urinalysis is reported to have been clear.  She does report that she has frequent stool contamination.  She may need long-term antibiotic suppression therapy.  She is discharged with catheter in place and is referred back to urology.  Final Clinical Impressions(s) / ED Diagnoses   Final diagnoses:  Acute urinary retention  Urinary tract infection without hematuria, site unspecified    ED Discharge Orders        Ordered    nitrofurantoin, macrocrystal-monohydrate, (MACROBID) 100 MG capsule  2 times daily     11/05/17 0018       Dione Booze, MD 11/05/17 [phone]

## 2017-11-05 DIAGNOSIS — R339 Retention of urine, unspecified: Secondary | ICD-10-CM | POA: Diagnosis not present

## 2017-11-05 MED ORDER — NITROFURANTOIN MONOHYD MACRO 100 MG PO CAPS
100.0000 mg | ORAL_CAPSULE | Freq: Once | ORAL | Status: AC
  Administered 2017-11-05: 100 mg via ORAL
  Filled 2017-11-05: qty 1

## 2017-11-05 MED ORDER — NITROFURANTOIN MONOHYD MACRO 100 MG PO CAPS: 100.0000 mg | ORAL_CAPSULE | Freq: Two times a day (BID) | ORAL | 0 refills | Status: DC

## 2017-11-05 NOTE — ED Notes (Signed)
AVS explained in detail. Daughter given leg bag but prefers to keep standard drainage bag on. Given specific instructions to complete Macrobid regimen. Also given teach back instructions on appropriate foley catheter care (no dependent loops, washing hands with soap and water before doing appropriate foley care, keeping foley bag off of the floor, emptying bag when it is half full). Family acknowledges understanding. No other c/c. Pt ambulatory to wheelchair.

## 2017-11-07 LAB — URINE CULTURE: Culture: >=100000 — AB

## 2017-11-08 ENCOUNTER — Telehealth: Payer: Self-pay | Admitting: Emergency Medicine

## 2017-11-08 NOTE — Telephone Encounter (Signed)
Post ED Visit - Positive Culture Follow-up: Successful Patient Follow-Up  Culture assessed and recommendations reviewed by: [x]  Enzo BiNathan Batchelder, Pharm.D. []  Celedonio MiyamotoJeremy Frens, [address]Ph[address]D., BCPS AQ-ID []  Garvin FilaMike Maccia, Pharm.D., BCPS []  Georgina PillionElizabeth Martin, [address]Ph[address]D., BCPS []  SmithvilleMinh Pham, [place]Pharm.D., BCPS, AAHIVP []  Estella HuskMichelle Turner, Pharm.D., BCPS, AAHIVP []  Lysle Pearlachel Rumbarger, PharmD, BCPS []  Casilda Carlsaylor Stone, PharmD, BCPS []  Pollyann SamplesAndy Johnston, PharmD, BCPS  Positive urine culture  [x]  Patient discharged without antimicrobial prescription and treatment is now indicated []  Organism is resistant to prescribed ED discharge antimicrobial []  Patient with positive blood cultures  Changes discussed with ED provider: Claudia DesanctisEmily Shrosbee PA New antibiotic prescription stop macrobid, start ciprofloxacin 250mg  po bid x 5 days  Attempting to contact patient     Berle MullMiller, Kaitlin Smith 11/08/2017, 2:05 PM

## 2017-11-08 NOTE — Progress Notes (Signed)
ED Antimicrobial Stewardship Positive Culture Follow Up   Kaitlin Smith is an 84 y.o. female who [facility] with a chief complaint of  Chief Complaint  Patient presents with  . Urinary Retention    Recent Results (from the past 720 hour(s))  Urine Culture     Status: None   Collection Time: 10/14/17  3:23 PM  Result Value Ref Range Status   Specimen Description URINE, RANDOM  Final   Special Requests NONE  Final   Culture   Final    NO GROWTH Performed at [facility], [address] [place], [place] [address]    Report Status 10/15/2017 FINAL  Final  Urine culture     Status: Abnormal   Collection Time: 11/04/17 11:19 PM  Result Value Ref Range Status   Specimen Description   Final    URINE, CLEAN CATCH Performed at [facility], [address] [place], [place] [address]    Special Requests   Final    NONE Performed at [facility], [address] [place], [place] [address]    Culture >=100,000 COLONIES/mL PSEUDOMONAS AERUGINOSA (A)  Final   Report Status 11/07/2017 FINAL  Final   Organism ID, Bacteria PSEUDOMONAS AERUGINOSA (A)  Final      Susceptibility   Pseudomonas aeruginosa - MIC*    CEFTAZIDIME 4 SENSITIVE Sensitive     CIPROFLOXACIN <=0.25 SENSITIVE Sensitive     GENTAMICIN <=1 SENSITIVE Sensitive     IMIPENEM 2 SENSITIVE Sensitive     PIP/TAZO 8 SENSITIVE Sensitive     CEFEPIME 2 SENSITIVE Sensitive     * >=100,000 COLONIES/mL PSEUDOMONAS AERUGINOSA    [x]  Treated with Macrobid, organism resistant to prescribed antimicrobial  New antibiotic prescription: Ciprofloxacin 250mg  PO BID x 5 days  ED Provider: Shirlyn Smith Shrosbree PA-C   Armandina StammerBATCHELDER,Kaitlin Smith 11/08/2017, 9:12 AM Infectious Diseases Pharmacist Phone# [phone]

## 2017-12-04 DIAGNOSIS — M1711 Unilateral primary osteoarthritis, right knee: Secondary | ICD-10-CM | POA: Insufficient documentation

## 2017-12-26 ENCOUNTER — Ambulatory Visit (INDEPENDENT_AMBULATORY_CARE_PROVIDER_SITE_OTHER): Payer: Federal, State, Local not specified - PPO | Admitting: Podiatry

## 2017-12-26 DIAGNOSIS — M79674 Pain in right toe(s): Secondary | ICD-10-CM

## 2017-12-26 DIAGNOSIS — E1151 Type 2 diabetes mellitus with diabetic peripheral angiopathy without gangrene: Secondary | ICD-10-CM | POA: Diagnosis not present

## 2017-12-26 DIAGNOSIS — M79676 Pain in unspecified toe(s): Secondary | ICD-10-CM

## 2017-12-26 DIAGNOSIS — I739 Peripheral vascular disease, unspecified: Secondary | ICD-10-CM

## 2017-12-26 DIAGNOSIS — B351 Tinea unguium: Secondary | ICD-10-CM

## 2017-12-26 DIAGNOSIS — M79675 Pain in left toe(s): Secondary | ICD-10-CM

## 2017-12-27 NOTE — Progress Notes (Signed)
Presents today chief complaint of painful elongated toenails 1 through 5 bilateral.  Objective: Pulses are palpable toenails are long thick yellow dystrophic like mycotic.  Assessment: Pain limp secondary to onychomycosis.  Plan: Debridement of toenails 1 through 5 bilateral.

## 2018-01-30 ENCOUNTER — Encounter: Payer: Self-pay | Admitting: Cardiovascular Disease

## 2018-01-30 ENCOUNTER — Ambulatory Visit: Payer: Federal, State, Local not specified - PPO | Admitting: Cardiovascular Disease

## 2018-01-30 VITALS — BP 140/82 | HR 66 | Ht 64.0 in | Wt 146.0 lb

## 2018-01-30 DIAGNOSIS — I252 Old myocardial infarction: Secondary | ICD-10-CM

## 2018-01-30 DIAGNOSIS — I739 Peripheral vascular disease, unspecified: Secondary | ICD-10-CM

## 2018-01-30 DIAGNOSIS — I10 Essential (primary) hypertension: Secondary | ICD-10-CM

## 2018-01-30 DIAGNOSIS — I6522 Occlusion and stenosis of left carotid artery: Secondary | ICD-10-CM | POA: Diagnosis not present

## 2018-01-30 DIAGNOSIS — R9431 Abnormal electrocardiogram [ECG] [EKG]: Secondary | ICD-10-CM | POA: Diagnosis not present

## 2018-01-30 MED ORDER — ISOSORBIDE MONONITRATE ER 30 MG PO TB24: 15.0000 mg | ORAL_TABLET | Freq: Every day | ORAL | 3 refills | Status: DC

## 2018-01-30 NOTE — Progress Notes (Signed)
01/30/2018 Kaitlin Smith   04/07/1930  [id number]  Primary Physician Alysia Penna, MD Primary Cardiologist: Runell Gess MD FACP, [place], [place], [place]  HPI:  Kaitlin Smith is a 84 y.o.   mildly overweight widowed African-American female with no children who lives with her niece who accompanies her today. I last saw her in the office  01/24/2017. She recently relocated from [facility] several months ago. She was referred by Dr. Leeanne Deed, her podiatrist, for peripheral vascular evaluation because of nonpalpable pulses. Her cardiovascular risk factor profile is notable for hyperlipidemia, diabetes and hypertension. She apparently set a stroke in March of this year with known carotid artery disease. She relates occlusion of her right carotid and known left carotid disease but she declined revascularization. She apparently also had a myocardial infarction in 2014 but was unaware of this. She gets occasional chest pain. She stopped smoking 2 years ago but was an infrequent smoker. She denies claudication. She does have symptoms compatible with diabetic peripheral neuropathy. Recent lower extremity arterial Doppler studies performed in our office 07/31/14 revealed ABIs of 0.8 bilaterally with high-grade bilateral SFA disease and severe tibial vessel disease. Her TBIs were any 0.5 range bilaterally as well. She really denies claudication but is limited by bilateral knee arthritis. Since I saw her year ago she's remained chronically stable. She is legally blind and essentially nonambulatory. She denies chest pain or shortness of breath but has had some shoulder and neck pain which may be her anginal equivalent.  Her EKG today does show septal Q waves with anterolateral T wave inversion new since her prior EKG.  I suspect she is had an intercurrent anterolateral out of hospital myocardial infarction.    Current Meds  Medication Sig  . acetaminophen (TYLENOL) 325 MG tablet Take 650 mg by mouth at bedtime  as needed for moderate pain.   Marland Kitchen amLODipine (NORVASC) 10 MG tablet TAKE 1 TABLET (10 MG TOTAL) BY MOUTH DAILY.  . cloNIDine (CATAPRES) 0.2 MG tablet Take 1 tablet (0.2 mg total) by mouth daily.  . clopidogrel (PLAVIX) 75 MG tablet TAKE 1 TABLET (75 MG TOTAL) BY MOUTH DAILY.  Marland Kitchen ezetimibe (ZETIA) 10 MG tablet TAKE 1 TABLET BY MOUTH EVERY DAY  . FREESTYLE LITE test strip CHECK 3 TIMES PER DAY  . hydrALAZINE (APRESOLINE) 10 MG tablet TAKE 1 TABLET BY MOUTH TWICE A DAY (Patient taking differently: TAKE 10 MG BY MOUTH ONCE DAILY)  . insulin glargine (LANTUS) 100 UNIT/ML injection Inject 0-0.1 mLs (0-10 Units total) into the skin at bedtime. Sliding scale (Patient taking differently: Inject 10 Units into the skin at bedtime. Sliding scale)  . insulin lispro (HUMALOG) 100 UNIT/ML injection INJECT 0.1 MILLILITERS (10 UNITS) INTO THE SKIN 3 TIMES A DAY WITH MEALS  . Insulin Syringe-Needle U-100 (BD INSULIN SYRINGE ULTRAFINE) 31G X 15/64" 0.5 ML MISC Take insulin once nightly as needed  . Lancets (FREESTYLE) lancets Check 3 times per day for E11.9  . LUMIGAN 0.01 % SOLN Place 1 drop into the left eye at bedtime.   . Multiple Vitamin (MULTIVITAMIN WITH MINERALS) TABS tablet Take 1 tablet by mouth daily. One-A-Day  . pantoprazole (PROTONIX) 40 MG tablet Take 1 tablet (40 mg total) by mouth daily.  . polyethylene glycol powder (GLYCOLAX/MIRALAX) powder Take 17 g by mouth daily. (Patient taking differently: Take 17 g by mouth daily as needed for mild constipation or moderate constipation. )  . SIMBRINZA 1-0.2 % SUSP Place 1 drop into the left  eye at bedtime.   . simvastatin (ZOCOR) 20 MG tablet TAKE 1 TABLET BY MOUTH AT BEDTIME  . VOLTAREN 1 % GEL APPLY 4 GRAMS TOPICALLY 2 TIMES DAILY AS NEEDED. (Patient taking differently: APPLY 4 GRAMS TOPICALLY 2 TIMES DAILY AS NEEDED FOR PAIN)     Allergies  Allergen Reactions  . Milk-Related Compounds Other (See Comments)    Lactose intolerant.      Social History    Socioeconomic History  . Marital status: Widowed    Spouse name: Not on file  . Number of children: Not on file  . Years of education: Not on file  . Highest education level: Not on file  Occupational History  . Not on file  Social Needs  . Financial resource strain: Not on file  . Food insecurity:    Worry: Not on file    Inability: Not on file  . Transportation needs:    Medical: Not on file    Non-medical: Not on file  Tobacco Use  . Smoking status: Former Smoker    Types: Cigarettes    Last attempt to quit: 06/06/2002    Years since quitting: 15.6  . Smokeless tobacco: Never Used  . Tobacco comment: Quit at age 49  Substance and Sexual Activity  . Alcohol use: No    Alcohol/week: 0.0 oz  . Drug use: No  . Sexual activity: Never  Lifestyle  . Physical activity:    Days per week: Not on file    Minutes per session: Not on file  . Stress: Not on file  Relationships  . Social connections:    Talks on phone: Not on file    Gets together: Not on file    Attends religious service: Not on file    Active member of club or organization: Not on file    Attends meetings of clubs or organizations: Not on file    Relationship status: Not on file  . Intimate partner violence:    Fear of current or ex partner: Not on file    Emotionally abused: Not on file    Physically abused: Not on file    Forced sexual activity: Not on file  Other Topics Concern  . Not on file  Social History Narrative  . Not on file     Review of Systems: General: negative for chills, fever, night sweats or weight changes.  Cardiovascular: negative for chest pain, dyspnea on exertion, edema, orthopnea, palpitations, paroxysmal nocturnal dyspnea or shortness of breath Dermatological: negative for rash Respiratory: negative for cough or wheezing Urologic: negative for hematuria Abdominal: negative for nausea, vomiting, diarrhea, bright red blood per rectum, melena, or hematemesis Neurologic:  negative for visual changes, syncope, or dizziness All other systems reviewed and are otherwise negative except as noted above.    Blood pressure 140/82, pulse 66, height  (1.626 m), weight 146 lb (66.2 kg).  General appearance: alert and no distress Neck: no adenopathy, no JVD, supple, symmetrical, trachea midline, thyroid not enlarged, symmetric, no tenderness/mass/nodules and Bilateral carotid bruits Lungs: clear to auscultation bilaterally Heart: regular rate and rhythm, S1, S2 normal, no murmur, click, rub or gallop Extremities: extremities normal, atraumatic, no cyanosis or edema Pulses: Absent pedal pulses Skin: Skin color, texture, turgor normal. No rashes or lesions Neurologic: Alert and oriented X 3, normal strength and tone. Normal symmetric reflexes. Normal coordination and gait  EKG sinus rhythm at 66 with septal Q waves, left ventricular hypertrophy with anterolateral T wave inversion new  since her prior EKG a year ago.  I personally reviewed this EKG.  ASSESSMENT AND PLAN:   Essential hypertension History of essential hypertension her blood pressure measured at 140/82.  She is on amlodipine, clonidine, hydralazine.  Continue current meds at current dosing.  Hyperlipidemia History of hyperlipidemia on Zetia and statin therapy.  Peripheral arterial disease History of peripheral arterial disease with Dopplers performed in the office 07/31/2014 revealing ABIs of 0.8 bilaterally with high-grade bilateral SFA disease and severe tibial vessel disease as well is minimally ambulatory and really denies medication and has no evidence of critical limb ischemia.  Carotid artery disease (HCC) History of known carotid artery disease with high-grade left ICA stenosis stroke in the past.  Patient declined revascularization  Abnormal EKG Ms. Bitterman EKG today shows sinus rhythm at 66 with septal Q waves and anterolateral T wave inversion new since her EKG a year ago.  I suspect she  has had an out of hospital anterolateral myocardial infarction.  She does complain of some pain across her shoulders which I suspect is her anginal equivalent.  I am going to start her on low-dose indoor.  I do not think she is a candidate for further work-up or evaluation of this and I suggested that she and her niece to discuss her wishes regarding DNR.      Runell Gess MD FACP,FACC,FAHA, [facility] 01/30/2018 5:00 PM

## 2018-01-30 NOTE — Patient Instructions (Signed)
Medication Instructions:  Your physician has recommended you make the following change in your medication:  1) START Imdur 15 mg ONCE daily - Take HALF tablet by mouth ONCE daily  Labwork: none  Testing/Procedures: none  Follow-Up: Your physician wants you to follow-up in: 12 months with Dr. Allyson Sabal. You will receive a reminder letter in the mail two months in advance. If you don't receive a letter, please call our office to schedule the follow-up appointment.   Any Other Special Instructions Will Be Listed Below (If Applicable).     If you need a refill on your cardiac medications before your next appointment, please call your pharmacy.

## 2018-01-30 NOTE — Assessment & Plan Note (Signed)
History of peripheral arterial disease with Dopplers performed in the office 07/31/2014 revealing ABIs of 0.8 bilaterally with high-grade bilateral SFA disease and severe tibial vessel disease as well is minimally ambulatory and really denies medication and has no evidence of critical limb ischemia.

## 2018-01-30 NOTE — Assessment & Plan Note (Signed)
History of hyperlipidemia on Zetia and statin therapy 

## 2018-01-30 NOTE — Assessment & Plan Note (Signed)
Kaitlin Smith EKG today shows sinus rhythm at 66 with septal Q waves and anterolateral T wave inversion new since her EKG a year ago.  I suspect she has had an out of hospital anterolateral myocardial infarction.  She does complain of some pain across her shoulders which I suspect is her anginal equivalent.  I am going to start her on low-dose indoor.  I do not think she is a candidate for further work-up or evaluation of this and I suggested that she and her niece to discuss her wishes regarding DNR.

## 2018-01-30 NOTE — Assessment & Plan Note (Signed)
History of known carotid artery disease with high-grade left ICA stenosis stroke in the past.  Patient declined revascularization

## 2018-01-30 NOTE — Assessment & Plan Note (Signed)
History of essential hypertension her blood pressure measured at 140/82.  She is on amlodipine, clonidine, hydralazine.  Continue current meds at current dosing.

## 2018-02-13 ENCOUNTER — Other Ambulatory Visit: Payer: Self-pay | Admitting: Cardiovascular Disease

## 2018-02-13 NOTE — Telephone Encounter (Signed)
Rx sent to pharmacy   

## 2018-02-26 ENCOUNTER — Ambulatory Visit: Admit: 2018-02-26 | Payer: Federal, State, Local not specified - PPO | Admitting: Obstetrics and Gynecology

## 2018-02-26 SURGERY — WIDE EXCISION VULVECTOMY
Anesthesia: Choice | Laterality: Bilateral

## 2018-02-28 ENCOUNTER — Other Ambulatory Visit: Payer: Self-pay | Admitting: Cardiovascular Disease

## 2018-02-28 DIAGNOSIS — Z8673 Personal history of transient ischemic attack (TIA), and cerebral infarction without residual deficits: Secondary | ICD-10-CM

## 2018-03-11 ENCOUNTER — Other Ambulatory Visit: Payer: Self-pay | Admitting: Cardiovascular Disease

## 2018-03-11 DIAGNOSIS — Z8673 Personal history of transient ischemic attack (TIA), and cerebral infarction without residual deficits: Secondary | ICD-10-CM

## 2018-03-12 NOTE — Telephone Encounter (Signed)
Rx request sent to pharmacy.  

## 2018-03-16 ENCOUNTER — Other Ambulatory Visit: Payer: Self-pay | Admitting: Cardiovascular Disease

## 2018-03-16 DIAGNOSIS — Z8673 Personal history of transient ischemic attack (TIA), and cerebral infarction without residual deficits: Secondary | ICD-10-CM

## 2018-03-19 NOTE — Telephone Encounter (Signed)
Rx(s) sent to pharmacy electronically.  

## 2018-04-02 ENCOUNTER — Encounter (HOSPITAL_COMMUNITY): Payer: Self-pay | Admitting: *Deleted

## 2018-04-02 ENCOUNTER — Emergency Department (HOSPITAL_COMMUNITY)
Admission: EM | Admit: 2018-04-02 | Discharge: 2018-04-02 | Disposition: A | Discharge status: Home / Self Care | Payer: Federal, State, Local not specified - PPO | Attending: Emergency Medicine | Admitting: Emergency Medicine

## 2018-04-02 DIAGNOSIS — Z87891 Personal history of nicotine dependence: Secondary | ICD-10-CM | POA: Diagnosis not present

## 2018-04-02 DIAGNOSIS — R55 Syncope and collapse: Secondary | ICD-10-CM

## 2018-04-02 DIAGNOSIS — Z79899 Other long term (current) drug therapy: Secondary | ICD-10-CM | POA: Insufficient documentation

## 2018-04-02 DIAGNOSIS — R8281 Pyuria: Secondary | ICD-10-CM

## 2018-04-02 DIAGNOSIS — N390 Urinary tract infection, site not specified: Secondary | ICD-10-CM | POA: Diagnosis not present

## 2018-04-02 DIAGNOSIS — I2510 Atherosclerotic heart disease of native coronary artery without angina pectoris: Secondary | ICD-10-CM | POA: Diagnosis not present

## 2018-04-02 DIAGNOSIS — Z794 Long term (current) use of insulin: Secondary | ICD-10-CM | POA: Insufficient documentation

## 2018-04-02 DIAGNOSIS — I10 Essential (primary) hypertension: Secondary | ICD-10-CM | POA: Diagnosis not present

## 2018-04-02 DIAGNOSIS — E119 Type 2 diabetes mellitus without complications: Secondary | ICD-10-CM | POA: Diagnosis not present

## 2018-04-02 LAB — COMPREHENSIVE METABOLIC PANEL
ALT: 11 U/L (ref 0–44)
ANION GAP: 7 (ref 5–15)
AST: 17 U/L (ref 15–41)
Albumin: 2.9 g/dL — ABNORMAL LOW (ref 3.5–5.0)
Alkaline Phosphatase: 81 U/L (ref 38–126)
BUN: 6 mg/dL — ABNORMAL LOW (ref 8–23)
CHLORIDE: 109 mmol/L (ref 98–111)
CO2: 26 mmol/L (ref 22–32)
Calcium: 9 mg/dL (ref 8.9–10.3)
Creatinine, Ser: 0.84 mg/dL (ref 0.44–1.00)
Glucose, Bld: 120 mg/dL — ABNORMAL HIGH (ref 70–99)
POTASSIUM: 4.1 mmol/L (ref 3.5–5.1)
Sodium: 142 mmol/L (ref 135–145)
Total Bilirubin: 0.5 mg/dL (ref 0.3–1.2)
Total Protein: 5.6 g/dL — ABNORMAL LOW (ref 6.5–8.1)

## 2018-04-02 LAB — URINALYSIS, ROUTINE W REFLEX MICROSCOPIC
Bilirubin Urine: NEGATIVE
Glucose, UA: NEGATIVE mg/dL
HGB URINE DIPSTICK: NEGATIVE
Ketones, ur: NEGATIVE mg/dL
Nitrite: NEGATIVE
Protein, ur: NEGATIVE mg/dL
Specific Gravity, Urine: 1.006 (ref 1.005–1.030)
pH: 7 (ref 5.0–8.0)

## 2018-04-02 LAB — CBC WITH DIFFERENTIAL/PLATELET
Abs Immature Granulocytes: 0 10*3/uL (ref 0.0–0.1)
Basophils Absolute: 0 10*3/uL (ref 0.0–0.1)
Basophils Relative: 0 %
Eosinophils Absolute: 0.1 10*3/uL (ref 0.0–0.7)
Eosinophils Relative: 1 %
HCT: 40.2 % (ref 36.0–46.0)
Hemoglobin: 13.3 g/dL (ref 12.0–15.0)
IMMATURE GRANULOCYTES: 0 %
Lymphocytes Relative: 21 %
Lymphs Abs: 1 10*3/uL (ref 0.7–4.0)
MCH: 31.7 pg (ref 26.0–34.0)
MCHC: 33.1 g/dL (ref 30.0–36.0)
MCV: 95.7 fL (ref 78.0–100.0)
MONOS PCT: 10 %
Monocytes Absolute: 0.5 10*3/uL (ref 0.1–1.0)
NEUTROS PCT: 67 %
Neutro Abs: 3.1 10*3/uL (ref 1.7–7.7)
Platelets: 187 10*3/uL (ref 150–400)
RBC: 4.2 MIL/uL (ref 3.87–5.11)
RDW: 12.6 % (ref 11.5–15.5)
WBC: 4.7 10*3/uL (ref 4.0–10.5)

## 2018-04-02 LAB — I-STAT TROPONIN, ED
TROPONIN I, POC: 0.04 ng/mL (ref 0.00–0.08)
Troponin i, poc: 0.03 ng/mL (ref 0.00–0.08)

## 2018-04-02 MED ORDER — CIPROFLOXACIN HCL 250 MG PO TABS: 250.0000 mg | ORAL_TABLET | Freq: Two times a day (BID) | ORAL | 0 refills | Status: DC

## 2018-04-02 MED ORDER — SODIUM CHLORIDE 0.9 % IV BOLUS
1000.0000 mL | Freq: Once | INTRAVENOUS | Status: AC
  Administered 2018-04-02: 1000 mL via INTRAVENOUS

## 2018-04-02 NOTE — ED Provider Notes (Signed)
[facility] Provider Note   CSN: [id number] Arrival date & time: 04/02/18  1100     History   Chief Complaint Chief Complaint  Patient presents with  . Hypotension  . Near Syncope    HPI Ascencion DikeLannie Moustafa is a 84 y.o. female with a past medical history of blindness, CAD, diabetes, hypertension, PVD, previous stroke who presents the emergency department with hypotension and near syncope.  According to EMS the niece who takes care of this patient called out because she was sitting at the table this morning and looked like she was about to lose consciousness.  Patient states that this lasted for about 15 minutes.  When EMS arrived she was found to have blood pressure of about 74 systolic which improved when she laid back to about 104.  Patient states that she did take her blood pressure medication this morning.  She is unsure if she is on a blood thinner and would be unable to characterize the color of her stools due to blindness.  She denies any racing or skipping in her heart at the time of the event.  She does state that she felt like she needed to make a bowel movement prior to the onset of symptoms.  She denies any recent volume loss.  She denies history of syncope but states she has had episodes of feeling like she might pass out in the past.  Denies fevers, chills, body aches, cough, urinary symptoms.  HPI  Past Medical History:  Diagnosis Date  . Allergy   . Arthritis   . Blindness   . Coronary artery disease   . Diabetes mellitus without complication (HCC)   . Glaucoma   . Hyperlipidemia   . Hypertension   . Myocardial infarction (HCC)   . Peripheral vascular disease (HCC)   . Stroke [facility](HCC)     Patient Active Problem List   Diagnosis Date Noted  . Abnormal EKG 01/30/2018  . MCI (mild cognitive impairment) 06/30/2015  . Essential hypertension 08/20/2014  . Hyperlipidemia 08/20/2014  . Peripheral arterial disease (HCC) 08/20/2014  .  Carotid artery disease (HCC) 06/06/2014  . Uncontrolled diabetes mellitus type 2 without complications (HCC) 05/13/2014  . History of CVA (cerebrovascular accident) 05/13/2014  . History of MI (myocardial infarction) 05/13/2014    Past Surgical History:  Procedure Laterality Date  . EYE SURGERY       OB History   None      Home Medications    Prior to Admission medications   Medication Sig Start Date End Date Taking? Authorizing Provider  acetaminophen (TYLENOL) 325 MG tablet Take 650 mg by mouth at bedtime as needed for moderate pain.     [provider]  amLODipine (NORVASC) 10 MG tablet TAKE 1 TABLET (10 MG TOTAL) BY MOUTH DAILY. 07/17/17   Runell GessBerry, Jonathan J, MD  cloNIDine (CATAPRES) 0.2 MG tablet Take 1 tablet (0.2 mg total) by mouth daily. 01/21/16   Runell GessBerry, Jonathan J, MD  clopidogrel (PLAVIX) 75 MG tablet TAKE 1 TABLET (75 MG TOTAL) BY MOUTH DAILY. 02/28/18   Runell GessBerry, Jonathan J, MD  ezetimibe (ZETIA) 10 MG tablet Take 1 tablet (10 mg total) by mouth daily. 03/19/18   Runell GessBerry, Jonathan J, MD  FREESTYLE LITE test strip CHECK 3 TIMES PER DAY 05/17/16   Quentin AngstJegede, Olugbemiga E, MD  hydrALAZINE (APRESOLINE) 10 MG tablet Take 1 tablet (10 mg total) by mouth 2 (two) times daily. 03/19/18   Runell GessBerry, Jonathan J, MD  insulin glargine (LANTUS) 100 UNIT/ML injection Inject 0-0.1 mLs (0-10 Units total) into the skin at bedtime. Sliding scale Patient taking differently: Inject 10 Units into the skin at bedtime. Sliding scale 11/12/15   [name], NP  insulin lispro (HUMALOG) 100 UNIT/ML injection INJECT 0.1 MILLILITERS (10 UNITS) INTO THE SKIN 3 TIMES A DAY WITH MEALS 10/29/16   Weber, Dema Severin, PA-C  Insulin Syringe-Needle U-100 (BD INSULIN SYRINGE ULTRAFINE) 31G X 15/64" 0.5 ML MISC Take insulin once nightly as needed 06/10/15   [name], NP  isosorbide mononitrate (IMDUR) 30 MG 24 hr tablet Take 0.5 tablets (15 mg total) by mouth daily. 01/30/18   Runell Gess, MD  Lancets  (FREESTYLE) lancets Check 3 times per day for E11.9 01/16/15   Holland Commons A, NP  LUMIGAN 0.01 % SOLN Place 1 drop into the left eye at bedtime.  08/11/14   [provider]  Multiple Vitamin (MULTIVITAMIN WITH MINERALS) TABS tablet Take 1 tablet by mouth daily. One-A-Day    [provider]  pantoprazole (PROTONIX) 40 MG tablet TAKE 1 TABLET BY MOUTH EVERY DAY 02/13/18   Runell Gess, MD  polyethylene glycol powder (GLYCOLAX/MIRALAX) powder Take 17 g by mouth daily. Patient taking differently: Take 17 g by mouth daily as needed for mild constipation or moderate constipation.  01/16/15   [name], NP  SIMBRINZA 1-0.2 % SUSP Place 1 drop into the left eye at bedtime.  08/11/14   [provider]  simvastatin (ZOCOR) 20 MG tablet TAKE 1 TABLET BY MOUTH AT BEDTIME 03/12/18   Runell Gess, MD  VOLTAREN 1 % GEL APPLY 4 GRAMS TOPICALLY 2 TIMES DAILY AS NEEDED. Patient taking differently: APPLY 4 GRAMS TOPICALLY 2 TIMES DAILY AS NEEDED FOR PAIN 04/25/16   Runell Gess, MD    Family History Family History  Problem Relation Age of Onset  . Diabetes Mother   . Stroke Mother   . Peripheral vascular disease Mother        amputation  . Cancer Brother   . Diabetes Brother   . Alcohol abuse Brother   . Varicose Veins Brother   . Hypertension Father   . Varicose Veins Father   . Heart attack Father   . Cancer Father   . Diabetes Sister   . Hypertension Sister     Social History Social History   Tobacco Use  . Smoking status: Former Smoker    Types: Cigarettes    Last attempt to quit: 06/06/2002    Years since quitting: 15.8  . Smokeless tobacco: Never Used  . Tobacco comment: Quit at age 78  Substance Use Topics  . Alcohol use: No    Alcohol/week: 0.0 oz  . Drug use: No     Allergies   Milk-related compounds   Review of Systems Review of Systems  Ten systems reviewed and are negative for acute change, except as noted in the HPI.    Physical Exam Updated Vital Signs BP (!) 110/54 (BP Location: Left Arm)   Pulse (!) 58   Temp (!) 97.5 F (36.4 C) (Oral)   Resp 14   SpO2 98%   Physical Exam  Constitutional: She is oriented to person, place, and time. She appears well-developed and well-nourished. No distress.  HENT:  Head: Normocephalic and atraumatic.  Eyes: Pupils are equal, round, and reactive to light. Conjunctivae and EOM are normal. No scleral icterus.  Neck: Normal range of motion. Neck supple. No  JVD present.  Cardiovascular: Normal rate, regular rhythm and normal heart sounds. Exam reveals no gallop and no friction rub.  No murmur heard. Pulmonary/Chest: Effort normal and breath sounds normal. No respiratory distress.  Abdominal: Soft. Bowel sounds are normal. She exhibits no distension and no mass. There is no tenderness. There is no guarding.  Musculoskeletal: She exhibits no edema.  Neurological: She is alert and oriented to person, place, and time.  Skin: Skin is warm and dry. No rash noted. She is not diaphoretic.  Psychiatric: Her behavior is normal.  Nursing note and vitals reviewed.    ED Treatments / Results  Labs (all labs ordered are listed, but only abnormal results are displayed) Labs Reviewed  BASIC METABOLIC PANEL  CBC  URINALYSIS, ROUTINE W REFLEX MICROSCOPIC  CBG MONITORING, ED    EKG EKG Interpretation  Date/Time:  Monday April 02 2018 11:31:05 EDT Ventricular Rate:  62 PR Interval:  264 QRS Duration: 86 QT Interval:  432 QTC Calculation: 438 R Axis:   -10 Text Interpretation:  Sinus rhythm with 1st degree A-V block Minimal voltage criteria for LVH, may be normal variant ST & T wave abnormality, consider inferior ischemia ST & T wave abnormality, consider anterolateral ischemia Abnormal ECG No significant change since last tracing Confirmed by Richardean Canal [phone]) on 04/02/2018 2:42:43 PM   Radiology No results found.  Procedures Procedures (including critical care  time)  Medications Ordered in ED Medications - No data to display   Initial Impression / Assessment and Plan / ED Course  I have reviewed the triage vital signs and the nursing notes.  Pertinent labs & imaging results that were available during my care of the patient were reviewed by me and considered in my medical decision making (see chart for details).  Clinical Course as of Apr 02 1605  Mon Apr 02, 2018  1529 Orthostatics negative   [AH]    Clinical Course User Index [AH] Arthor Captain, PA-C    Patient with episode of near syncope.  Seen and shared visit with Dr. Silverio Lay who feels like this is likely a vasovagal event.  Patient is awaiting UA and repeat troponin.  If negative plan is for discharge.  She appears well throughout her visit here.  Final Clinical Impressions(s) / ED Diagnoses   Final diagnoses:  None    ED Discharge Orders    None       Arthor Captain, PA-C 04/02/18 1621    Charlynne Pander, MD 04/03/18 [phone]

## 2018-04-02 NOTE — ED Provider Notes (Signed)
[facility] PA-C, at 1600. Patient presenting with near syncopal episode suspicious for vasovagal event. Screening labs reassuring. Initial troponin negative. EKG without significant change from prior. Plan at time of handoff is to f/u second troponin and discharge if negative.  Second troponin negative. Patient ambulatory in the ED. Patient reassessed at bedside. History of present illness briefly reviewed. Patient and caregiver are comfortable with discharge. Her UA does show pyuria with rare bacteria. Does not appear to be contaminated. UA similar to that with prior pseudomonas UTI. Patient denying urinary symptoms but caregiver states she had no symptoms with prior infections as well. Prior pseudomonas infection was treated successfully with oral course of ciprofloxacin. Discussed risks and benefits of fluoroquinolone with patient and daughter at bedside including tendon rupture and aortic dissection. They understand and will monitor for symptoms.  Return precautions and follow up plan reviewed. All questions answered. Discharged home in stable condition.   Cecille PoMacklin, Kemesha Mosey W, MD 04/02/18 Kermit [id number]    Plunkett, Whitney, MD 04/03/18 Moses [id number]

## 2018-04-02 NOTE — Discharge Instructions (Addendum)
Get help right away if: °You have a severe headache. °You have unusual pain in your chest, abdomen, or back. °You are bleeding from your mouth or rectum, or you have black or tarry stool. °You have a very fast or irregular heartbeat (palpitations). °You faint once or repeatedly. °You have a seizure. °You are confused. °You have trouble walking. °You have severe weakness. °You have vision problems. °

## 2018-04-02 NOTE — ED Notes (Signed)
This RN went to bedside to start IV and draw blood when family told this RN they normally have a hard time getting her iv and have to use iv, I acknowledged this and informed family that I would just look and not stick patient unless I felt I would be successful. Upon finding a vein and preparing to stick patient family become very upset and stated, "I don't know why you want listen to me". I then stated if she wishes I will not stick this patient and I can consult IV team as she wishes. Family informed this will cause a delay in care as we have to place and consult and wait for them to respond.

## 2018-04-02 NOTE — ED Notes (Signed)
ED Provider at bedside. 

## 2018-04-02 NOTE — ED Triage Notes (Addendum)
[facility] where she had a witnessed near syncopal episode while sitting at breakfast table, pt denies pain or any other symptoms, pt hypotensive BP 70 sys pta, trendelenburg position with BP 128 upon arrival to ED, HR 64, pt A&O x4, c/o generalized weakness, pt legally blind, hx of CVA with R sided facial droop at baseline

## 2018-04-04 LAB — URINE CULTURE

## 2018-05-11 ENCOUNTER — Ambulatory Visit: Payer: Federal, State, Local not specified - PPO | Admitting: Podiatry

## 2018-05-11 ENCOUNTER — Encounter: Payer: Self-pay | Admitting: Podiatry

## 2018-05-11 DIAGNOSIS — M79675 Pain in left toe(s): Secondary | ICD-10-CM | POA: Diagnosis not present

## 2018-05-11 DIAGNOSIS — B353 Tinea pedis: Secondary | ICD-10-CM | POA: Diagnosis not present

## 2018-05-11 DIAGNOSIS — E1151 Type 2 diabetes mellitus with diabetic peripheral angiopathy without gangrene: Secondary | ICD-10-CM

## 2018-05-11 DIAGNOSIS — B351 Tinea unguium: Secondary | ICD-10-CM

## 2018-05-11 DIAGNOSIS — M79674 Pain in right toe(s): Secondary | ICD-10-CM

## 2018-05-11 MED ORDER — KETOCONAZOLE 2 % EX CREA: 1.0000 "application " | TOPICAL_CREAM | Freq: Every day | CUTANEOUS | 1 refills | Status: AC

## 2018-05-11 NOTE — Patient Instructions (Signed)
Athlete's Foot Athlete's foot (tinea pedis) is a fungal infection of the skin on the feet. It often occurs on the skin that is between or underneath the toes. It can also occur on the soles of the feet. The infection can spread from person to person (is contagious). What are the causes? Athlete's foot is caused by a fungus. This fungus grows in warm, moist places. Most people get athlete's foot by sharing shower stalls, towels, and wet floors with someone who is infected. Not washing your feet or changing your socks often enough can contribute to athlete's foot. What increases the risk? This condition is more likely to develop in:  Men.  People who have a weak body defense system (immune system).  People who have diabetes.  People who use public showers, such as at a gym.  People who wear heavy-duty shoes, such as industrial or military shoes.  Seasons with warm, humid weather.  What are the signs or symptoms? Symptoms of this condition include:  Itchy areas between the toes or on the soles of the feet.  White, flaky, or scaly areas between the toes or on the soles of the feet.  Very itchy small blisters between the toes or on the soles of the feet.  Small cuts on the skin. These cuts can become infected.  Thick or discolored toenails.  How is this diagnosed? This condition is diagnosed with a medical history and physical exam. Your health care provider may also take a skin or toenail sample to be examined. How is this treated? Treatment for this condition includes antifungal medicines. These may be applied as powders, ointments, or creams. In severe cases, an oral antifungal medicine may be given. Follow these instructions at home:  Apply or take over-the-counter and prescription medicines only as told by your health care provider.  Keep all follow-up visits as told by your health care provider. This is important.  Do not scratch your feet.  Keep your feet dry: ? Wear  cotton or wool socks. Change your socks every day or if they become wet. ? Wear shoes that allow air to circulate, such as sandals or canvas tennis shoes.  Wash and dry your feet: ? Every day or as told by your health care provider. ? After exercising. ? Including the area between your toes.  Do not share towels, nail clippers, or other personal items that touch your feet with others.  If you have diabetes, keep your blood sugar under control. How is this prevented?  Do not share towels.  Wear sandals in wet areas, such as locker rooms and shared showers.  Keep your feet dry: ? Wear cotton or wool socks. Change your socks every day or if they become wet. ? Wear shoes that allow air to circulate, such as sandals or canvas tennis shoes.  Wash and dry your feet after exercising. Pay attention to the area between your toes. Contact a health care provider if:  You have a fever.  You have swelling, soreness, warmth, or redness in your foot.  You are not getting better with treatment.  Your symptoms get worse.  You have new symptoms. This information is not intended to replace advice given to you by your health care provider. Make sure you discuss any questions you have with your health care provider. Document Released: 09/02/2000 Document Revised: 02/11/2016 Document Reviewed: 03/09/2015 Elsevier Interactive Patient Education  2018 [employer]  

## 2018-05-14 NOTE — Progress Notes (Signed)
Subjective: [name]  presents follow up of painful, thick, elongated toenails b/l. States wearing shoes aggravates condition. Patient's niece Is present in treatment room  Objective: Orientated 3  Vascular: Nonpitting edema bilaterally DP and PT pulses 0/4 bilaterally Capillary reflex delayed bilaterally Sensation to 10 g monofilament wire intact 4/5 right 5/5 left Vibratory sensation reactive bilaterally  Dermatological No open skin lesions bilaterally Atrophic skin with absent hair growthbilaterally Callus lateral right heel The toenails are hypertrophic, elongated, discolored, deformed and tender to palpation x 10 Dfiffuse scaling noted plantarly and peripherally b/l  Musculoskeletal: Manual motor testing dorsi flexion, plantar flexion 5/5 bilaterally  Assessment: Symptomatic onychomycoses 6-10 Diabetic with a history of peripheral arterial disease Tinea pedis  Plan: Debridement toenails x  10 mechanically and electrically  without anybleeding Rx written for Ketoconazole cream to be applied to both feet twice daily. Follow up 9 weeks. Patient/POA to call should there be a concern in the interim   Reappoint 3 months

## 2018-06-13 ENCOUNTER — Other Ambulatory Visit: Payer: Self-pay | Admitting: Cardiovascular Disease

## 2018-07-23 ENCOUNTER — Telehealth: Payer: Self-pay | Admitting: Emergency Medicine

## 2018-07-23 NOTE — Telephone Encounter (Signed)
Lost to followup 

## 2018-08-08 ENCOUNTER — Ambulatory Visit: Payer: Federal, State, Local not specified - PPO | Admitting: Podiatry

## 2018-08-16 ENCOUNTER — Encounter (HOSPITAL_COMMUNITY): Payer: Self-pay | Admitting: Emergency Medicine

## 2018-08-16 ENCOUNTER — Inpatient Hospital Stay (HOSPITAL_COMMUNITY)
Admission: EM | Admit: 2018-08-16 | Discharge: 2018-08-23 | DRG: 300 | Disposition: A | Discharge status: Home Health | Payer: Medicare Other | Attending: Internal Medicine | Admitting: Internal Medicine

## 2018-08-16 DIAGNOSIS — Z8673 Personal history of transient ischemic attack (TIA), and cerebral infarction without residual deficits: Secondary | ICD-10-CM

## 2018-08-16 DIAGNOSIS — E785 Hyperlipidemia, unspecified: Secondary | ICD-10-CM | POA: Diagnosis present

## 2018-08-16 DIAGNOSIS — E1165 Type 2 diabetes mellitus with hyperglycemia: Secondary | ICD-10-CM | POA: Diagnosis present

## 2018-08-16 DIAGNOSIS — Z87891 Personal history of nicotine dependence: Secondary | ICD-10-CM

## 2018-08-16 DIAGNOSIS — Z8249 Family history of ischemic heart disease and other diseases of the circulatory system: Secondary | ICD-10-CM

## 2018-08-16 DIAGNOSIS — Z794 Long term (current) use of insulin: Secondary | ICD-10-CM

## 2018-08-16 DIAGNOSIS — E1151 Type 2 diabetes mellitus with diabetic peripheral angiopathy without gangrene: Secondary | ICD-10-CM | POA: Diagnosis present

## 2018-08-16 DIAGNOSIS — M1990 Unspecified osteoarthritis, unspecified site: Secondary | ICD-10-CM | POA: Diagnosis present

## 2018-08-16 DIAGNOSIS — G3184 Mild cognitive impairment, so stated: Secondary | ICD-10-CM | POA: Diagnosis present

## 2018-08-16 DIAGNOSIS — E049 Nontoxic goiter, unspecified: Secondary | ICD-10-CM | POA: Diagnosis present

## 2018-08-16 DIAGNOSIS — Z66 Do not resuscitate: Secondary | ICD-10-CM | POA: Diagnosis present

## 2018-08-16 DIAGNOSIS — I2510 Atherosclerotic heart disease of native coronary artery without angina pectoris: Secondary | ICD-10-CM | POA: Diagnosis present

## 2018-08-16 DIAGNOSIS — Z79899 Other long term (current) drug therapy: Secondary | ICD-10-CM

## 2018-08-16 DIAGNOSIS — H548 Legal blindness, as defined in USA: Secondary | ICD-10-CM | POA: Diagnosis present

## 2018-08-16 DIAGNOSIS — Z823 Family history of stroke: Secondary | ICD-10-CM

## 2018-08-16 DIAGNOSIS — N390 Urinary tract infection, site not specified: Secondary | ICD-10-CM | POA: Diagnosis present

## 2018-08-16 DIAGNOSIS — I161 Hypertensive emergency: Secondary | ICD-10-CM | POA: Diagnosis present

## 2018-08-16 DIAGNOSIS — Z7902 Long term (current) use of antithrombotics/antiplatelets: Secondary | ICD-10-CM

## 2018-08-16 DIAGNOSIS — E119 Type 2 diabetes mellitus without complications: Secondary | ICD-10-CM | POA: Diagnosis present

## 2018-08-16 DIAGNOSIS — I7100 Dissection of unspecified site of aorta: Secondary | ICD-10-CM | POA: Diagnosis present

## 2018-08-16 DIAGNOSIS — I7101 Dissection of thoracic aorta: Secondary | ICD-10-CM | POA: Diagnosis not present

## 2018-08-16 DIAGNOSIS — I252 Old myocardial infarction: Secondary | ICD-10-CM

## 2018-08-16 DIAGNOSIS — H409 Unspecified glaucoma: Secondary | ICD-10-CM | POA: Diagnosis present

## 2018-08-16 DIAGNOSIS — Z833 Family history of diabetes mellitus: Secondary | ICD-10-CM

## 2018-08-16 DIAGNOSIS — I739 Peripheral vascular disease, unspecified: Secondary | ICD-10-CM | POA: Diagnosis present

## 2018-08-16 DIAGNOSIS — E739 Lactose intolerance, unspecified: Secondary | ICD-10-CM | POA: Diagnosis present

## 2018-08-16 DIAGNOSIS — I10 Essential (primary) hypertension: Secondary | ICD-10-CM | POA: Diagnosis present

## 2018-08-16 DIAGNOSIS — I71019 Dissection of thoracic aorta, unspecified: Secondary | ICD-10-CM

## 2018-08-16 DIAGNOSIS — R911 Solitary pulmonary nodule: Secondary | ICD-10-CM | POA: Diagnosis present

## 2018-08-16 DIAGNOSIS — I700 Atherosclerosis of aorta: Secondary | ICD-10-CM | POA: Diagnosis present

## 2018-08-16 DIAGNOSIS — K5730 Diverticulosis of large intestine without perforation or abscess without bleeding: Secondary | ICD-10-CM | POA: Diagnosis present

## 2018-08-16 DIAGNOSIS — M546 Pain in thoracic spine: Secondary | ICD-10-CM | POA: Diagnosis present

## 2018-08-16 LAB — BASIC METABOLIC PANEL
ANION GAP: 7 (ref 5–15)
BUN: 8 mg/dL (ref 8–23)
CHLORIDE: 106 mmol/L (ref 98–111)
CO2: 27 mmol/L (ref 22–32)
Calcium: 9 mg/dL (ref 8.9–10.3)
Creatinine, Ser: 0.92 mg/dL (ref 0.44–1.00)
GFR calc Af Amer: >60 mL/min (ref >60)
GFR calc non Af Amer: 56 mL/min — ABNORMAL LOW (ref >60)
Glucose, Bld: 128 mg/dL — ABNORMAL HIGH (ref 70–99)
POTASSIUM: 4.4 mmol/L (ref 3.5–5.1)
SODIUM: 140 mmol/L (ref 135–145)

## 2018-08-16 LAB — CBC WITH DIFFERENTIAL/PLATELET
ABS IMMATURE GRANULOCYTES: 0.03 10*3/uL (ref 0.00–0.07)
BASOS ABS: 0 10*3/uL (ref 0.0–0.1)
Basophils Relative: 0 %
Eosinophils Absolute: 0.1 10*3/uL (ref 0.0–0.5)
Eosinophils Relative: 3 %
HCT: 44.6 % (ref 36.0–46.0)
Hemoglobin: 14.1 g/dL (ref 12.0–15.0)
IMMATURE GRANULOCYTES: 1 %
LYMPHS PCT: 19 %
Lymphs Abs: 1 10*3/uL (ref 0.7–4.0)
MCH: 31 pg (ref 26.0–34.0)
MCHC: 31.6 g/dL (ref 30.0–36.0)
MCV: 98 fL (ref 80.0–100.0)
Monocytes Absolute: 0.5 10*3/uL (ref 0.1–1.0)
Monocytes Relative: 9 %
NEUTROS ABS: 3.8 10*3/uL (ref 1.7–7.7)
NEUTROS PCT: 68 %
NRBC: 0 % (ref 0.0–0.2)
Platelets: 204 10*3/uL (ref 150–400)
RBC: 4.55 MIL/uL (ref 3.87–5.11)
RDW: 12.2 % (ref 11.5–15.5)
WBC: 5.5 10*3/uL (ref 4.0–10.5)

## 2018-08-16 LAB — URINALYSIS, ROUTINE W REFLEX MICROSCOPIC
Bilirubin Urine: NEGATIVE
GLUCOSE, UA: NEGATIVE mg/dL
Ketones, ur: NEGATIVE mg/dL
Nitrite: NEGATIVE
Protein, ur: NEGATIVE mg/dL
SPECIFIC GRAVITY, URINE: 1.026 (ref 1.005–1.030)
WBC, UA: >50 WBC/hpf — ABNORMAL HIGH (ref 0–5)
pH: 5 (ref 5.0–8.0)

## 2018-08-16 LAB — I-STAT CHEM 8, ED
BUN: 9 mg/dL (ref 8–23)
Calcium, Ion: 1.14 mmol/L — ABNORMAL LOW (ref 1.15–1.40)
Chloride: 104 mmol/L (ref 98–111)
Creatinine, Ser: 0.9 mg/dL (ref 0.44–1.00)
Glucose, Bld: 121 mg/dL — ABNORMAL HIGH (ref 70–99)
HEMATOCRIT: 43 % (ref 36.0–46.0)
HEMOGLOBIN: 14.6 g/dL (ref 12.0–15.0)
POTASSIUM: 4.3 mmol/L (ref 3.5–5.1)
SODIUM: 141 mmol/L (ref 135–145)
TCO2: 28 mmol/L (ref 22–32)

## 2018-08-16 LAB — I-STAT TROPONIN, ED: TROPONIN I, POC: 0.04 ng/mL (ref 0.00–0.08)

## 2018-08-16 MED ORDER — FENTANYL CITRATE (PF) 100 MCG/2ML IJ SOLN
50.0000 ug | Freq: Once | INTRAMUSCULAR | Status: AC
  Administered 2018-08-16: 50 ug via INTRAVENOUS
  Filled 2018-08-16: qty 2

## 2018-08-16 NOTE — ED Provider Notes (Addendum)
11:30 PM  Assumed care from Dr. Fredderick PhenixBelfi.  Patient is a 84 y.o. female with history of hypertension who presents to the emergency department with upper back pain for the past several weeks it is not reproducible.  Found to be hypertensive here.  This improved after pain medication.  Labs, urine, CTA pending.  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  Discussed with Dr. Genevive Biano with vascular surgery who has reviewed her imaging and recommends tight blood pressure control and admission to the ICU.  He will see patient in consult in the morning.  Appreciate his help.  Patient's blood pressure still elevated.  Currently 170s/90s.  She is on hydralazine, clonidine, metoprolol at home.  Heart rate ranges in the 60s to 80s.  Will start Cardene drip and admit to ICU.  2:05 AM  D/w Dr. Darrick Pennaeterding critical care.  They will see patient in the ED for admission.  Patient and daughter updated with plan.   Of note, patient's urine does show large leukocytes and budding yeast but few bacteria and she has an indwelling Foley catheter in place.  She has no fever or leukocytosis at this time.  Kidneys appear normal on CT imaging.  Will hold on antibiotic treatment and await urine culture.  I reviewed all nursing notes, vitals, pertinent previous records, EKGs, lab and urine results, imaging (as available).     CRITICAL CARE Performed by: Rochele RaringKristen    Total critical care time: 45 minutes  Critical care time was exclusive of separately billable procedures and treating other patients.  Critical care was necessary to treat or prevent imminent or life-threatening deterioration.  Critical care was time spent personally by me on the following activities: development  of treatment plan with patient and/or surrogate as well as nursing, discussions with consultants, evaluation of patient's response to treatment, examination of patient, obtaining history from patient or surrogate, ordering and performing treatments and interventions, ordering and review of laboratory studies, ordering and review of radiographic studies, pulse oximetry and re-evaluation of patient's condition.    , Layla MawKristen N, DO 08/17/18 0210    , Layla MawKristen N, DO 08/17/18 [id number]

## 2018-08-16 NOTE — ED Provider Notes (Signed)
[facility]   CSN: [id number] Arrival date & time: 08/16/18  2145     History   Chief Complaint Chief Complaint  Patient presents with  . Back Pain  . urinary pain    HPI Kaitlin Smith is a 84 y.o. female.  Patient is a 84 year old female with a history of hyperlipidemia, blindness, coronary artery disease, diabetes, hypertension who presents with back pain.  She reports some upper back pain around her shoulder blades bilaterally.  It started earlier this evening.  Her daughter states that she is complained of this in the past intermittently for the last 3 weeks.  She denies any other back pain.  No chest pain.  No shortness of breath.  No abdominal pain.  She has an indwelling Foley catheter and her daughter says that it was bothering her earlier today but patient says is not bothering her now.  She denies any abdominal pain.  No nausea or vomiting.  No dizziness.  No cough or cold symptoms.  She states it does not hurt worse when she moves or breathes.  She says it is just there and hurting all the time.  She denies any recent falls or injuries to the area.  No numbness or weakness to her extremities.     Past Medical History:  Diagnosis Date  . Allergy   . Arthritis   . Blindness   . Coronary artery disease   . Diabetes mellitus without complication (HCC)   . Glaucoma   . Hyperlipidemia   . Hypertension   . Myocardial infarction (HCC)   . Peripheral vascular disease (HCC)   . Stroke [facility](HCC)     Patient Active Problem List   Diagnosis Date Noted  . Abnormal EKG 01/30/2018  . MCI (mild cognitive impairment) 06/30/2015  . Essential hypertension 08/20/2014  . Hyperlipidemia 08/20/2014  . Peripheral arterial disease (HCC) 08/20/2014  . Carotid artery disease (HCC) 06/06/2014  . Uncontrolled diabetes mellitus type 2 without complications (HCC) 05/13/2014  . History of CVA (cerebrovascular accident) 05/13/2014  . History  of MI (myocardial infarction) 05/13/2014    Past Surgical History:  Procedure Laterality Date  . EYE SURGERY       OB History   None      Home Medications    Prior to Admission medications   Medication Sig Start Date End Date Taking? Authorizing Provider  calcium carbonate (OSCAL) 1500 (600 Ca) MG TABS tablet Take 1,500 mg by mouth daily with breakfast.   Yes [provider]  cloNIDine (CATAPRES) 0.2 MG tablet Take 1 tablet (0.2 mg total) by mouth daily. 01/21/16  Yes Runell GessBerry, Jonathan J, MD  clopidogrel (PLAVIX) 75 MG tablet TAKE 1 TABLET (75 MG TOTAL) BY MOUTH DAILY. 02/28/18  Yes Runell GessBerry, Jonathan J, MD  escitalopram (LEXAPRO) 5 MG tablet Take 5 mg by mouth daily.   Yes [provider]  ezetimibe (ZETIA) 10 MG tablet Take 1 tablet (10 mg total) by mouth daily. 03/19/18  Yes Runell GessBerry, Jonathan J, MD  hydrALAZINE (APRESOLINE) 10 MG tablet Take 1 tablet (10 mg total) by mouth 2 (two) times daily. 03/19/18  Yes Runell GessBerry, Jonathan J, MD  insulin glargine (LANTUS) 100 UNIT/ML injection Inject 0-0.1 mLs (0-10 Units total) into the skin at bedtime. Sliding scale Patient taking differently: Inject 0-10 Units into the skin See admin instructions. Per Sliding scale 11/12/15  Yes Holland CommonsKeck, Valerie A, NP  insulin lispro (HUMALOG) 100 UNIT/ML injection INJECT 0.1 MILLILITERS (10 UNITS)  INTO THE SKIN 3 TIMES A DAY WITH MEALS Patient taking differently: Inject 0-10 Units into the skin See admin instructions. Per Sliding Scale 3 TIMES A DAY WITH MEALS 10/29/16  Yes Weber, Sarah L, PA-C  labetalol (NORMODYNE) 200 MG tablet Take 200 mg by mouth 2 (two) times daily.   Yes [provider]  loratadine (CLARITIN) 10 MG tablet Take 10 mg by mouth daily.   Yes [provider]  pantoprazole (PROTONIX) 40 MG tablet TAKE 1 TABLET BY MOUTH EVERY DAY 02/13/18  Yes Runell Gess, MD  simvastatin (ZOCOR) 20 MG tablet TAKE 1 TABLET BY MOUTH AT BEDTIME 03/12/18  Yes Runell Gess, MD  Vitamin D,  Ergocalciferol, (DRISDOL) 50000 units CAPS capsule Take 50,000 Units by mouth every Wednesday. 03/28/18  Yes [provider]  amLODipine (NORVASC) 10 MG tablet TAKE 1 TABLET (10 MG TOTAL) BY MOUTH DAILY. Patient not taking: Reported on 08/16/2018 06/13/18   Runell Gess, MD  ciprofloxacin (CIPRO) 250 MG tablet Take 1 tablet (250 mg total) by mouth every 12 (twelve) hours. Patient not taking: Reported on 08/16/2018 04/02/18   Cecille Po, MD  FREESTYLE LITE test strip CHECK 3 TIMES PER DAY 05/17/16   Quentin Angst, MD  Insulin Syringe-Needle U-100 (BD INSULIN SYRINGE ULTRAFINE) 31G X 15/64" 0.5 ML MISC Take insulin once nightly as needed 06/10/15   [name], NP  isosorbide mononitrate (IMDUR) 30 MG 24 hr tablet Take 0.5 tablets (15 mg total) by mouth daily. Patient not taking: Reported on 08/16/2018 01/30/18   Runell Gess, MD  Lancets (FREESTYLE) lancets Check 3 times per day for E11.9 01/16/15   Holland Commons A, NP  polyethylene glycol powder (GLYCOLAX/MIRALAX) powder Take 17 g by mouth daily. Patient not taking: Reported on 08/16/2018 01/16/15   Holland Commons A, NP  VOLTAREN 1 % GEL APPLY 4 GRAMS TOPICALLY 2 TIMES DAILY AS NEEDED. Patient not taking: Reported on 08/16/2018 04/25/16   Runell Gess, MD    Family History Family History  Problem Relation Age of Onset  . Diabetes Mother   . Stroke Mother   . Peripheral vascular disease Mother        amputation  . Cancer Brother   . Diabetes Brother   . Alcohol abuse Brother   . Varicose Veins Brother   . Hypertension Father   . Varicose Veins Father   . Heart attack Father   . Cancer Father   . Diabetes Sister   . Hypertension Sister     Social History Social History   Tobacco Use  . Smoking status: Former Smoker    Types: Cigarettes    Last attempt to quit: 06/06/2002    Years since quitting: 16.2  . Smokeless tobacco: Never Used  . Tobacco comment: Quit at age 74  Substance Use Topics  .  Alcohol use: No    Alcohol/week: 0.0 standard drinks  . Drug use: No     Allergies   Milk-related compounds   Review of Systems Review of Systems  Constitutional: Negative for chills, diaphoresis, fatigue and fever.  HENT: Negative for congestion, rhinorrhea and sneezing.   Eyes: Negative.   Respiratory: Negative for cough, chest tightness and shortness of breath.   Cardiovascular: Negative for chest pain and leg swelling.  Gastrointestinal: Negative for abdominal pain, blood in stool, diarrhea, nausea and vomiting.  Genitourinary: Negative for difficulty urinating, flank pain, frequency and hematuria.  Musculoskeletal: Positive for back pain. Negative for arthralgias.  Skin: Negative for rash.  Neurological: Negative for dizziness, speech difficulty, weakness, numbness and headaches.     Physical Exam Updated Vital Signs BP (!) 217/112 (BP Location: Right Arm)   Pulse 77   Temp 97.7 F (36.5 C) (Oral)   Resp 16   SpO2 100%   Physical Exam  Constitutional: She is oriented to person, place, and time. She appears well-developed and well-nourished.  HENT:  Head: Normocephalic and atraumatic.  Eyes: Pupils are equal, round, and reactive to light.  Neck: Normal range of motion. Neck supple.  Cardiovascular: Normal rate, regular rhythm and normal heart sounds.  Pulmonary/Chest: Effort normal and breath sounds normal. No respiratory distress. She has no wheezes. She has no rales. She exhibits no tenderness.  Abdominal: Soft. Bowel sounds are normal. There is no tenderness. There is no rebound and no guarding.  Musculoskeletal: Normal range of motion. She exhibits no edema.  No reproducible back pain.  However she describes pain in the area of her scapulas bilaterally.  There is no tenderness along the spine.  Lymphadenopathy:    She has no cervical adenopathy.  Neurological: She is alert and oriented to person, place, and time.  Skin: Skin is warm and dry. No rash noted.    Psychiatric: She has a normal mood and affect.     ED Treatments / Results  Labs (all labs ordered are listed, but only abnormal results are displayed) Labs Reviewed  BASIC METABOLIC PANEL  CBC WITH DIFFERENTIAL/PLATELET  I-STAT TROPONIN, ED  I-STAT CHEM 8, ED    EKG EKG Interpretation  Date/Time:  Thursday August 16 2018 23:06:03 EST Ventricular Rate:  66 PR Interval:  220 QRS Duration: 92 QT Interval:  412 QTC Calculation: 431 R Axis:   -32 Text Interpretation:  Sinus rhythm with 1st degree A-V block with Premature atrial complexes Left axis deviation Moderate voltage criteria for LVH, may be normal variant Cannot rule out Septal infarct , age undetermined ST & T wave abnormality, consider inferior ischemia ST & T wave abnormality, consider anterolateral ischemia Abnormal ECG similar to prior EKG Confirmed by Rolan Bucco [phone]) on 08/16/2018 10:10:07 PM   Radiology No results found.  Procedures Procedures (including critical care time)  Medications Ordered in ED Medications  fentaNYL (SUBLIMAZE) injection 50 mcg (has no administration in time range)     Initial Impression / Assessment and Plan / ED Course  I have reviewed the triage vital signs and the nursing notes.  Pertinent labs & imaging results that were available during my care of the patient were reviewed by me and considered in my medical decision making (see chart for details).    PT presents with upper back pain.  Awaiting labs and CT chest.  Dr. Elesa Massed to take over care pending results.  May be musculoskeletal but given the patient's age and elevated blood pressure, I wanted to make sure her aorta looked okay.  Final Clinical Impressions(s) / ED Diagnoses   Final diagnoses:  None    ED Discharge Orders    None       Rolan Bucco, MD 08/16/18 2311

## 2018-08-16 NOTE — ED Triage Notes (Signed)
Pt reports back pain that started today.  She also reports her catheter is causing her "discomfort" and "her urine is dark per family.  Pt is blind and had trouble walking to bed.

## 2018-08-17 ENCOUNTER — Inpatient Hospital Stay (HOSPITAL_COMMUNITY): Payer: Medicare Other

## 2018-08-17 ENCOUNTER — Emergency Department (HOSPITAL_COMMUNITY): Payer: Medicare Other

## 2018-08-17 DIAGNOSIS — Z79899 Other long term (current) drug therapy: Secondary | ICD-10-CM | POA: Diagnosis not present

## 2018-08-17 DIAGNOSIS — I10 Essential (primary) hypertension: Secondary | ICD-10-CM | POA: Diagnosis present

## 2018-08-17 DIAGNOSIS — Z823 Family history of stroke: Secondary | ICD-10-CM | POA: Diagnosis not present

## 2018-08-17 DIAGNOSIS — N390 Urinary tract infection, site not specified: Secondary | ICD-10-CM | POA: Diagnosis present

## 2018-08-17 DIAGNOSIS — Z66 Do not resuscitate: Secondary | ICD-10-CM | POA: Diagnosis present

## 2018-08-17 DIAGNOSIS — I700 Atherosclerosis of aorta: Secondary | ICD-10-CM | POA: Diagnosis present

## 2018-08-17 DIAGNOSIS — Z8249 Family history of ischemic heart disease and other diseases of the circulatory system: Secondary | ICD-10-CM | POA: Diagnosis not present

## 2018-08-17 DIAGNOSIS — G3184 Mild cognitive impairment, so stated: Secondary | ICD-10-CM | POA: Diagnosis present

## 2018-08-17 DIAGNOSIS — M546 Pain in thoracic spine: Secondary | ICD-10-CM | POA: Diagnosis present

## 2018-08-17 DIAGNOSIS — R410 Disorientation, unspecified: Secondary | ICD-10-CM | POA: Diagnosis not present

## 2018-08-17 DIAGNOSIS — I7100 Dissection of unspecified site of aorta: Secondary | ICD-10-CM | POA: Diagnosis present

## 2018-08-17 DIAGNOSIS — Z8673 Personal history of transient ischemic attack (TIA), and cerebral infarction without residual deficits: Secondary | ICD-10-CM | POA: Diagnosis not present

## 2018-08-17 DIAGNOSIS — E1169 Type 2 diabetes mellitus with other specified complication: Secondary | ICD-10-CM | POA: Diagnosis not present

## 2018-08-17 DIAGNOSIS — H409 Unspecified glaucoma: Secondary | ICD-10-CM | POA: Diagnosis present

## 2018-08-17 DIAGNOSIS — I2510 Atherosclerotic heart disease of native coronary artery without angina pectoris: Secondary | ICD-10-CM | POA: Diagnosis present

## 2018-08-17 DIAGNOSIS — I161 Hypertensive emergency: Secondary | ICD-10-CM

## 2018-08-17 DIAGNOSIS — E785 Hyperlipidemia, unspecified: Secondary | ICD-10-CM | POA: Diagnosis present

## 2018-08-17 DIAGNOSIS — I7101 Dissection of thoracic aorta: Secondary | ICD-10-CM

## 2018-08-17 DIAGNOSIS — I252 Old myocardial infarction: Secondary | ICD-10-CM | POA: Diagnosis not present

## 2018-08-17 DIAGNOSIS — K5730 Diverticulosis of large intestine without perforation or abscess without bleeding: Secondary | ICD-10-CM | POA: Diagnosis present

## 2018-08-17 DIAGNOSIS — R339 Retention of urine, unspecified: Secondary | ICD-10-CM | POA: Diagnosis not present

## 2018-08-17 DIAGNOSIS — E1151 Type 2 diabetes mellitus with diabetic peripheral angiopathy without gangrene: Secondary | ICD-10-CM | POA: Diagnosis present

## 2018-08-17 DIAGNOSIS — M1990 Unspecified osteoarthritis, unspecified site: Secondary | ICD-10-CM | POA: Diagnosis present

## 2018-08-17 DIAGNOSIS — I739 Peripheral vascular disease, unspecified: Secondary | ICD-10-CM | POA: Diagnosis not present

## 2018-08-17 DIAGNOSIS — E049 Nontoxic goiter, unspecified: Secondary | ICD-10-CM | POA: Diagnosis present

## 2018-08-17 DIAGNOSIS — R911 Solitary pulmonary nodule: Secondary | ICD-10-CM | POA: Diagnosis present

## 2018-08-17 DIAGNOSIS — E739 Lactose intolerance, unspecified: Secondary | ICD-10-CM | POA: Diagnosis present

## 2018-08-17 DIAGNOSIS — E1165 Type 2 diabetes mellitus with hyperglycemia: Secondary | ICD-10-CM | POA: Diagnosis present

## 2018-08-17 DIAGNOSIS — H548 Legal blindness, as defined in USA: Secondary | ICD-10-CM | POA: Diagnosis present

## 2018-08-17 LAB — CBC
HCT: 44.4 % (ref 36.0–46.0)
Hemoglobin: 14.6 g/dL (ref 12.0–15.0)
MCH: 31 pg (ref 26.0–34.0)
MCHC: 32.9 g/dL (ref 30.0–36.0)
MCV: 94.3 fL (ref 80.0–100.0)
Platelets: 197 10*3/uL (ref 150–400)
RBC: 4.71 MIL/uL (ref 3.87–5.11)
RDW: 12.1 % (ref 11.5–15.5)
WBC: 5.9 10*3/uL (ref 4.0–10.5)
nRBC: 0 % (ref 0.0–0.2)

## 2018-08-17 LAB — GLUCOSE, CAPILLARY
Glucose-Capillary: 122 mg/dL — ABNORMAL HIGH (ref 70–99)
Glucose-Capillary: 87 mg/dL (ref 70–99)
Glucose-Capillary: 96 mg/dL (ref 70–99)
Glucose-Capillary: 107 mg/dL — ABNORMAL HIGH (ref 70–99)
Glucose-Capillary: 153 mg/dL — ABNORMAL HIGH (ref 70–99)

## 2018-08-17 LAB — ECHOCARDIOGRAM COMPLETE: Weight: 2275.15 [oz_av]

## 2018-08-17 LAB — BASIC METABOLIC PANEL
Anion gap: 7 (ref 5–15)
BUN: 7 mg/dL — ABNORMAL LOW (ref 8–23)
CO2: 25 mmol/L (ref 22–32)
Calcium: 9.2 mg/dL (ref 8.9–10.3)
Chloride: 108 mmol/L (ref 98–111)
Creatinine, Ser: 0.74 mg/dL (ref 0.44–1.00)
GFR calc Af Amer: >60 mL/min (ref >60)
GFR calc non Af Amer: >60 mL/min (ref >60)
Glucose, Bld: 95 mg/dL (ref 70–99)
Potassium: 3.8 mmol/L (ref 3.5–5.1)
Sodium: 140 mmol/L (ref 135–145)

## 2018-08-17 LAB — MRSA PCR SCREENING: MRSA by PCR: NEGATIVE

## 2018-08-17 LAB — PHOSPHORUS: Phosphorus: 3.3 mg/dL (ref 2.5–4.6)

## 2018-08-17 LAB — MAGNESIUM: Magnesium: 1.8 mg/dL (ref 1.7–2.4)

## 2018-08-17 MED ORDER — ALBUTEROL SULFATE (2.5 MG/3ML) 0.083% IN NEBU: 2.5000 mg | INHALATION_SOLUTION | RESPIRATORY_TRACT | Status: DC | PRN

## 2018-08-17 MED ORDER — HYDRALAZINE HCL 10 MG PO TABS
10.0000 mg | ORAL_TABLET | Freq: Two times a day (BID) | ORAL | Status: DC
  Administered 2018-08-17 – 2018-08-21 (×10): 10 mg via ORAL
  Filled 2018-08-17 (×14): qty 1

## 2018-08-17 MED ORDER — IOPAMIDOL (ISOVUE-370) INJECTION 76%
INTRAVENOUS | Status: AC
  Filled 2018-08-17: qty 100

## 2018-08-17 MED ORDER — FENTANYL CITRATE (PF) 100 MCG/2ML IJ SOLN: 50.0000 ug | Freq: Once | INTRAMUSCULAR | Status: DC

## 2018-08-17 MED ORDER — NICARDIPINE HCL IN NACL 20-0.86 MG/200ML-% IV SOLN
3.0000 mg/h | INTRAVENOUS | Status: DC
  Administered 2018-08-17 (×3): 15 mg/h via INTRAVENOUS
  Administered 2018-08-17: 10 mg/h via INTRAVENOUS
  Filled 2018-08-17 (×5): qty 200

## 2018-08-17 MED ORDER — EZETIMIBE 10 MG PO TABS
10.0000 mg | ORAL_TABLET | Freq: Every day | ORAL | Status: DC
  Administered 2018-08-17 – 2018-08-23 (×7): 10 mg via ORAL
  Filled 2018-08-17 (×8): qty 1

## 2018-08-17 MED ORDER — ESCITALOPRAM OXALATE 10 MG PO TABS
5.0000 mg | ORAL_TABLET | Freq: Every day | ORAL | Status: DC
  Administered 2018-08-17 – 2018-08-23 (×6): 5 mg via ORAL
  Filled 2018-08-17 (×9): qty 1

## 2018-08-17 MED ORDER — INSULIN ASPART 100 UNIT/ML ~~LOC~~ SOLN
0.0000 [IU] | Freq: Three times a day (TID) | SUBCUTANEOUS | Status: DC
  Administered 2018-08-17: 3 [IU] via SUBCUTANEOUS
  Administered 2018-08-19 – 2018-08-20 (×2): 2 [IU] via SUBCUTANEOUS
  Administered 2018-08-21: 3 [IU] via SUBCUTANEOUS

## 2018-08-17 MED ORDER — IOPAMIDOL (ISOVUE-370) INJECTION 76%
100.0000 mL | Freq: Once | INTRAVENOUS | Status: AC | PRN
  Administered 2018-08-17: 100 mL via INTRAVENOUS

## 2018-08-17 MED ORDER — ONDANSETRON HCL 4 MG/2ML IJ SOLN
4.0000 mg | Freq: Four times a day (QID) | INTRAMUSCULAR | Status: DC | PRN
  Administered 2018-08-17: 4 mg via INTRAVENOUS
  Filled 2018-08-17: qty 2

## 2018-08-17 MED ORDER — ISOSORBIDE MONONITRATE ER 30 MG PO TB24: 15.0000 mg | ORAL_TABLET | Freq: Every day | ORAL | Status: DC

## 2018-08-17 MED ORDER — MORPHINE SULFATE (PF) 4 MG/ML IV SOLN: 4.0000 mg | INTRAVENOUS | Status: DC | PRN

## 2018-08-17 MED ORDER — ACETAMINOPHEN 325 MG PO TABS
650.0000 mg | ORAL_TABLET | ORAL | Status: DC | PRN
  Administered 2018-08-20 – 2018-08-22 (×3): 650 mg via ORAL
  Filled 2018-08-17 (×3): qty 2

## 2018-08-17 MED ORDER — SODIUM CHLORIDE 0.9 % IV SOLN
INTRAVENOUS | Status: DC | PRN
  Administered 2018-08-17: 500 mL via INTRAVENOUS

## 2018-08-17 MED ORDER — INSULIN ASPART 100 UNIT/ML ~~LOC~~ SOLN
0.0000 [IU] | SUBCUTANEOUS | Status: DC
  Administered 2018-08-17: 2 [IU] via SUBCUTANEOUS

## 2018-08-17 MED ORDER — SODIUM CHLORIDE 0.9 % IV SOLN
1.0000 g | INTRAVENOUS | Status: DC
  Administered 2018-08-17 – 2018-08-18 (×2): 1 g via INTRAVENOUS
  Filled 2018-08-17 (×2): qty 10

## 2018-08-17 MED ORDER — NICARDIPINE HCL IN NACL 20-0.86 MG/200ML-% IV SOLN
3.0000 mg/h | INTRAVENOUS | Status: DC
  Administered 2018-08-17: 5 mg/h via INTRAVENOUS
  Filled 2018-08-17: qty 200

## 2018-08-17 MED ORDER — LABETALOL HCL 200 MG PO TABS
200.0000 mg | ORAL_TABLET | Freq: Two times a day (BID) | ORAL | Status: DC
  Administered 2018-08-17 (×2): 200 mg via ORAL
  Filled 2018-08-17 (×2): qty 1

## 2018-08-17 MED ORDER — CLONIDINE HCL 0.2 MG PO TABS
0.2000 mg | ORAL_TABLET | Freq: Every day | ORAL | Status: DC
  Administered 2018-08-17 – 2018-08-19 (×3): 0.2 mg via ORAL
  Filled 2018-08-17 (×3): qty 1

## 2018-08-17 MED ORDER — PANTOPRAZOLE SODIUM 40 MG PO TBEC
40.0000 mg | DELAYED_RELEASE_TABLET | Freq: Every day | ORAL | Status: DC
  Administered 2018-08-17 – 2018-08-23 (×6): 40 mg via ORAL
  Filled 2018-08-17 (×8): qty 1

## 2018-08-17 MED ORDER — SIMVASTATIN 20 MG PO TABS
20.0000 mg | ORAL_TABLET | Freq: Every day | ORAL | Status: DC
  Administered 2018-08-17 – 2018-08-22 (×6): 20 mg via ORAL
  Filled 2018-08-17 (×6): qty 1

## 2018-08-17 MED ORDER — LACTATED RINGERS IV SOLN
INTRAVENOUS | Status: DC
  Administered 2018-08-17: 06:00:00 via INTRAVENOUS

## 2018-08-17 MED ORDER — AMLODIPINE BESYLATE 5 MG PO TABS: 10.0000 mg | ORAL_TABLET | Freq: Every day | ORAL | Status: DC

## 2018-08-17 NOTE — Plan of Care (Signed)

## 2018-08-17 NOTE — Consult Note (Signed)
Hospital Consult    Reason for Consult: Thoracic aortic dissection Referring Physician: Dr. Elesa Massed MRN #:  [id number]  History of Present Illness: This is a 84 y.o. female with history of coronary artery disease, hypertension, diabetes who reported to the emergency department with upper back pain that has been intermittent for several weeks.  Pain was worse earlier this evening causing her to present to the emergency department.  She was hypertensive on arrival with systolic pressures greater than 200 blood pressure now better controlled and her pain is now well controlled with as needed medications.  This time she has no complaints.  She denies any abdominal pain and does not have any chest pain.  Denies shortness of breath.  Patient does have indwelling Foley catheter.  She is moving all of her extremities without limitations.  Past Medical History:  Diagnosis Date  . Allergy   . Arthritis   . Blindness   . Coronary artery disease   . Diabetes mellitus without complication (HCC)   . Glaucoma   . Hyperlipidemia   . Hypertension   . Myocardial infarction (HCC)   . Peripheral vascular disease (HCC)   . Stroke [facility])     Past Surgical History:  Procedure Laterality Date  . EYE SURGERY      Allergies  Allergen Reactions  . Milk-Related Compounds Other (See Comments)    Lactose intolerant.      Prior to Admission medications   Medication Sig Start Date End Date Taking? Authorizing Provider  calcium carbonate (OSCAL) 1500 (600 Ca) MG TABS tablet Take 1,500 mg by mouth daily with breakfast.   Yes [provider]  cloNIDine (CATAPRES) 0.2 MG tablet Take 1 tablet (0.2 mg total) by mouth daily. 01/21/16  Yes Runell Gess, MD  clopidogrel (PLAVIX) 75 MG tablet TAKE 1 TABLET (75 MG TOTAL) BY MOUTH DAILY. 02/28/18  Yes Runell Gess, MD  escitalopram (LEXAPRO) 5 MG tablet Take 5 mg by mouth daily.   Yes [provider]  ezetimibe (ZETIA) 10 MG tablet Take 1 tablet  (10 mg total) by mouth daily. 03/19/18  Yes Runell Gess, MD  hydrALAZINE (APRESOLINE) 10 MG tablet Take 1 tablet (10 mg total) by mouth 2 (two) times daily. 03/19/18  Yes Runell Gess, MD  insulin glargine (LANTUS) 100 UNIT/ML injection Inject 0-0.1 mLs (0-10 Units total) into the skin at bedtime. Sliding scale Patient taking differently: Inject 0-10 Units into the skin See admin instructions. Per Sliding scale 11/12/15  Yes Holland Commons A, NP  insulin lispro (HUMALOG) 100 UNIT/ML injection INJECT 0.1 MILLILITERS (10 UNITS) INTO THE SKIN 3 TIMES A DAY WITH MEALS Patient taking differently: Inject 0-10 Units into the skin See admin instructions. Per Sliding Scale 3 TIMES A DAY WITH MEALS 10/29/16  Yes Weber, Sarah L, PA-C  labetalol (NORMODYNE) 200 MG tablet Take 200 mg by mouth 2 (two) times daily.   Yes [provider]  loratadine (CLARITIN) 10 MG tablet Take 10 mg by mouth daily.   Yes [provider]  pantoprazole (PROTONIX) 40 MG tablet TAKE 1 TABLET BY MOUTH EVERY DAY 02/13/18  Yes Runell Gess, MD  simvastatin (ZOCOR) 20 MG tablet TAKE 1 TABLET BY MOUTH AT BEDTIME 03/12/18  Yes Runell Gess, MD  Vitamin D, Ergocalciferol, (DRISDOL) 50000 units CAPS capsule Take 50,000 Units by mouth every Wednesday. 03/28/18  Yes [provider]  amLODipine (NORVASC) 10 MG tablet TAKE 1 TABLET (10 MG TOTAL) BY MOUTH DAILY. Patient  not taking: Reported on 08/16/2018 06/13/18   Runell GessBerry, Jonathan J, MD  ciprofloxacin (CIPRO) 250 MG tablet Take 1 tablet (250 mg total) by mouth every 12 (twelve) hours. Patient not taking: Reported on 08/16/2018 04/02/18   Cecille PoMacklin, Nicholas W, MD  FREESTYLE LITE test strip CHECK 3 TIMES PER DAY 05/17/16   Quentin AngstJegede, Olugbemiga E, MD  Insulin Syringe-Needle U-100 (BD INSULIN SYRINGE ULTRAFINE) 31G X 15/64" 0.5 ML MISC Take insulin once nightly as needed 06/10/15   Ambrose FinlandKeck, Valerie A, NP  isosorbide mononitrate (IMDUR) 30 MG 24 hr tablet Take 0.5 tablets  (15 mg total) by mouth daily. Patient not taking: Reported on 08/16/2018 01/30/18   Runell GessBerry, Jonathan J, MD  Lancets (FREESTYLE) lancets Check 3 times per day for E11.9 01/16/15   Holland CommonsKeck, Valerie A, NP  polyethylene glycol powder (GLYCOLAX/MIRALAX) powder Take 17 g by mouth daily. Patient not taking: Reported on 08/16/2018 01/16/15   Holland CommonsKeck, Valerie A, NP  VOLTAREN 1 % GEL APPLY 4 GRAMS TOPICALLY 2 TIMES DAILY AS NEEDED. Patient not taking: Reported on 08/16/2018 04/25/16   Runell GessBerry, Jonathan J, MD    Social History   Socioeconomic History  . Marital status: Widowed    Spouse name: Not on file  . Number of children: Not on file  . Years of education: Not on file  . Highest education level: Not on file  Occupational History  . Not on file  Social Needs  . Financial resource strain: Not on file  . Food insecurity:    Worry: Not on file    Inability: Not on file  . Transportation needs:    Medical: Not on file    Non-medical: Not on file  Tobacco Use  . Smoking status: Former Smoker    Types: Cigarettes    Last attempt to quit: 06/06/2002    Years since quitting: 16.2  . Smokeless tobacco: Never Used  . Tobacco comment: Quit at age 84  Substance and Sexual Activity  . Alcohol use: No    Alcohol/week: 0.0 standard drinks  . Drug use: No  . Sexual activity: Never  Lifestyle  . Physical activity:    Days per week: Not on file    Minutes per session: Not on file  . Stress: Not on file  Relationships  . Social connections:    Talks on phone: Not on file    Gets together: Not on file    Attends religious service: Not on file    Active member of club or organization: Not on file    Attends meetings of clubs or organizations: Not on file    Relationship status: Not on file  . Intimate partner violence:    Fear of current or ex partner: Not on file    Emotionally abused: Not on file    Physically abused: Not on file    Forced sexual activity: Not on file  Other Topics Concern  . Not on  file  Social History Narrative  . Not on file     Family History  Problem Relation Age of Onset  . Diabetes Mother   . Stroke Mother   . Peripheral vascular disease Mother        amputation  . Cancer Brother   . Diabetes Brother   . Alcohol abuse Brother   . Varicose Veins Brother   . Hypertension Father   . Varicose Veins Father   . Heart attack Father   . Cancer Father   . Diabetes Sister   .  Hypertension Sister     ROS:  Cardiovascular: []  chest pain/pressure []  palpitations []  SOB lying flat []  DOE []  pain in legs while walking []  pain in legs at rest []  pain in legs at night []  non-healing ulcers []  hx of DVT []  swelling in legs  Pulmonary: []  productive cough []  asthma/wheezing []  home O2  Neurologic: []  weakness in []  arms []  legs []  numbness in []  arms []  legs []  hx of CVA []  mini stroke [] difficulty speaking or slurred speech []  temporary loss of vision in one eye []  dizziness  Hematologic: []  hx of cancer []  bleeding problems []  problems with blood clotting easily  Endocrine:   [x]  diabetes []  thyroid disease  GI []  vomiting blood []  blood in stool  GU: []  CKD/renal failure []  HD--[]  M/W/F or []  T/T/S []  burning with urination []  blood in urine  Psychiatric: []  anxiety []  depression  Musculoskeletal: []  arthritis []  joint pain  Integumentary: []  rashes []  ulcers  Constitutional: []  fever []  chills   Physical Examination  Vitals:   08/17/18 0215 08/17/18 0220  BP: (!) 155/66 (!) 182/92  Pulse: 76 91  Resp: 19 16  Temp:    SpO2: 99% 99%   There is no height or weight on file to calculate BMI.  General: No acute distress HENT: WNL, normocephalic Pulmonary: normal non-labored breathing Cardiac: Palpable radial and common femoral pulses bilaterally Abdomen:  soft, NT/ND, no masses Extremities: Well-perfused Musculoskeletal: no muscle wasting or atrophy  Neurologic: A&O X 3; Appropriate Affect ; SENSATION:  normal; MOTOR FUNCTION:  moving all extremities equally. Speech is fluent/normal   CBC    Component Value Date/Time   WBC 5.5 08/16/2018 2312   RBC 4.55 08/16/2018 2312   HGB 14.6 08/16/2018 2322   HCT 43.0 08/16/2018 2322   PLT 204 08/16/2018 2312   MCV 98.0 08/16/2018 2312   MCH 31.0 08/16/2018 2312   MCHC 31.6 08/16/2018 2312   RDW 12.2 08/16/2018 2312   LYMPHSABS 1.0 08/16/2018 2312   MONOABS 0.5 08/16/2018 2312   EOSABS 0.1 08/16/2018 2312   BASOSABS 0.0 08/16/2018 2312    BMET    Component Value Date/Time   NA 141 08/16/2018 2322   K 4.3 08/16/2018 2322   CL 104 08/16/2018 2322   CO2 27 08/16/2018 2312   GLUCOSE 121 (H) 08/16/2018 2322   BUN 9 08/16/2018 2322   CREATININE 0.90 08/16/2018 2322   CREATININE 0.69 06/10/2015 1310   CALCIUM 9.0 08/16/2018 2312   GFRNONAA 56 (L) 08/16/2018 2312   GFRNONAA 81 06/10/2015 1310   GFRAA >60 08/16/2018 2312   GFRAA >89 06/10/2015 1310    COAGS: No results found for: INR, PROTIME   Non-Invasive Vascular Imaging:  3  I reviewed the CT scan of her chest abdomen pelvis with the radiologist impression below.  Appears to have chronic atherosclerotic arch and descending thoracic aorta.  There is possibly an acute dissection flap at the distal thoracic aorta may be the cause of her current symptoms.  She appears to be perfusing her end organs without flow limitation.  IMPRESSION: 1. Diffuse thoracoabdominal aortic atherosclerosis with calcified and noncalcified plaque. There is irregular mural thrombus involving the transverse and descending aorta. 2. Focal curvilinear hypodensity in the distal descending thoracic aorta just proximal to the diaphragmatic hiatus has changed in morphology from 2015 CT, now extending into the aortic lumen. May represent irregular mural thrombus or atypical dissection, age indeterminate. No periaortic stranding. 3. Foley catheter in the  urinary bladder, mild perivesicular stranding may  represent urinary tract infection. 4. Small pulmonary nodules largest measuring 5 mm. No follow-up needed if patient is low-risk (and has no known or suspected primary neoplasm). Non-contrast chest CT can be considered in 12 months if patient is high-risk. This recommendation follows the consensus statement: Guidelines for Management of Incidental Pulmonary Nodules Detected on CT Images: From the Fleischner Society 2017; Radiology 2017; 284:228-243. 5. High-density gallbladder contents, likely sludge. No pericholecystic inflammation. 6. Colonic diverticulosis without diverticulitis. Stool distending the rectum with rectal wall thickening, suspicious for fecal impaction. 7. Enlarged thyroid gland with multiple nodules and calcifications consistent with goiter.   ASSESSMENT/PLAN: This is a 84 y.o. female presents with back pain with significant hypertension now better controlled and pain is under better control as well.  CT scan demonstrated likely acute distal thoracic aortic pathology with likely dissection flap.  I discussed with her the natural history of this in the usual follow-up CT scan 48 to 72 hours.  Patient states that she would not desire any surgery unless absolutely necessary given her age.  We will follow along with possible plan for repeat CT scan if symptoms do not resolve and/or blood pressure cannot be controlled.  Zailee Vallely C. Randie Heinz, MD [facility] [place] Office: [phone] Pager: [phone]

## 2018-08-17 NOTE — Progress Notes (Signed)
  Echocardiogram 2D Echocardiogram has been performed.  Tycen Dockter L Androw 08/17/2018, 3:15 PM

## 2018-08-17 NOTE — Progress Notes (Signed)
  Progress Note    08/17/2018 11:01 AM * No surgery found *  Subjective:  Feeling better  Vitals:   08/17/18 1015 08/17/18 1030  BP: (!) 101/47 (!) 82/46  Pulse: 71 69  Resp: 16 19  Temp:    SpO2: 98% 98%    Physical Exam: aaox3 Non labored respirations Abdomen soft and ntnd Palpable femoral pulses bilaterally Feet are neuro in tact and well perfused   CBC    Component Value Date/Time   WBC 5.9 08/17/2018 0528   RBC 4.71 08/17/2018 0528   HGB 14.6 08/17/2018 0528   HCT 44.4 08/17/2018 0528   PLT 197 08/17/2018 0528   MCV 94.3 08/17/2018 0528   MCH 31.0 08/17/2018 0528   MCHC 32.9 08/17/2018 0528   RDW 12.1 08/17/2018 0528   LYMPHSABS 1.0 08/16/2018 2312   MONOABS 0.5 08/16/2018 2312   EOSABS 0.1 08/16/2018 2312   BASOSABS 0.0 08/16/2018 2312    BMET    Component Value Date/Time   NA 140 08/17/2018 0528   K 3.8 08/17/2018 0528   CL 108 08/17/2018 0528   CO2 25 08/17/2018 0528   GLUCOSE 95 08/17/2018 0528   BUN 7 (L) 08/17/2018 0528   CREATININE 0.74 08/17/2018 0528   CREATININE 0.69 06/10/2015 1310   CALCIUM 9.2 08/17/2018 0528   GFRNONAA >60 08/17/2018 0528   GFRNONAA 81 06/10/2015 1310   GFRAA >60 08/17/2018 0528   GFRAA >89 06/10/2015 1310    INR No results found for: INR   Intake/Output Summary (Last 24 hours) at 08/17/2018 1101 Last data filed at 08/17/2018 1000 Gross per 24 hour  Intake 1448.55 ml  Output 3050 ml  Net -1601.45 ml     Assessment:  84 y.o. female is here with hypertension and distal thoracic dissection that initally caused back pain but now resolved  Plan: Ok for diet from vascular perspective Will plan repeat CTA dissection protocol in 48-72 hours from initial   QuitmanBrandon C. Randie Heinzain, MD [facility] [place] Office: [phone] Pager: [phone]  08/17/2018 11:01 AM

## 2018-08-17 NOTE — ED Notes (Signed)
Critical provider bedside

## 2018-08-17 NOTE — Progress Notes (Addendum)
   NAMAscencion Dike:  Kaitlin Smith, MRN:  [id number], DOB:  [birth year], LOS: 0 ADMISSION DATE:  08/16/2018, CONSULTATION DATE:  11/29 REFERRING MD:  11/29, CHIEF COMPLAINT:  Back pain   Brief History   84 year old female admitted w/ new back pain. Found to have descending thoracic aortic dissection   Past Medical History  HTN, HLD, DM, blindness   Significant Hospital Events   11/29 admitted w/ back pain and found to have descending thoracic aortic dissection. Seen by vascular surg. Goal is to avoid surg if able   Consults:  Vascular surgery  Procedures:    Significant Diagnostic Tests:  CT chest 11/29: 1. Diffuse thoracoabdominal aortic atherosclerosis with calcified and noncalcified plaque. There is irregular mural thrombus involving the transverse and descending aorta. 2. Focal curvilinear hypodensity in the distal descending thoracic aorta just proximal to the diaphragmatic hiatus has changed in morphology from 2015 CT, now extending into the aortic lumen. May represent irregular mural thrombus or atypical dissection, age indeterminate. No periaortic stranding.  Micro Data:    Antimicrobials:    Interim history/subjective:  Feels better. Back pain gone   Objective   Blood pressure (Abnormal) 82/46, pulse 69, temperature 98.8 F (37.1 C), temperature source Oral, resp. rate 19, weight 64.5 kg, SpO2 98 %.        Intake/Output Summary (Last 24 hours) at 08/17/2018 1126 Last data filed at 08/17/2018 1000 Gross per 24 hour  Intake 1448.55 ml  Output 3050 ml  Net -1601.45 ml   Filed Weights   08/17/18 0500  Weight: 64.5 kg    Examination: General: 84 year old female in bed no distress HENT: NCAT no JVD Lungs: clear no accessory use  Cardiovascular: RRR w/out MRG Abdomen: soft not tender + bowel sounds  Extremities: warm and dry  Neuro: awake and oriented  GU: clear yellow   Resolved Hospital Problem list     Assessment & Plan:   Hypertensive urgency w/ Descending  thoracic aortic dissection,  possible thrombus  Plan Target SBP < 140   F/u TEE Repeat CT imaging 48-72 hrs per vascular recs  UTI Foley placed in out-pt setting w. Plan to dc  Plan Day 1 of 3 rocephin  Dc foley  DM Plan ssi   HLD Plan Statin and zetia   Best practice:  Diet: reg; Pain/Anxiety/Delirium protocol (if indicated): NA VAP protocol (if indicated): NA DVT prophylaxis: scd GI prophylaxis: NA Glucose control: ssi Mobility: BR Code Status: dnr/dni Family Communication: updated  Disposition: ICU monitoring but can go to [facility]  Simonne MartinetPeter E Jadavion Spoelstra ACNP-BC [facility] Pager # [phone] OR # [phone] if no answer

## 2018-08-17 NOTE — H&P (Addendum)
NAME:  Kaitlin Smith, MRN:  [id number], DOB:  [birth year], LOS: 0 ADMISSION DATE:  08/16/2018, CONSULTATION DATE:  08/17/18 REFERRING MD: ED , CHIEF COMPLAINT:   Back pain  Brief History   84 yo with hx of hld , htn , blind , dm p/w back pain found to have descending thoracic aortic dissection.  History of present illness   84 yo with hx of dm, htn , hld , blind presenting with upper back pain , ct of the chest and abdomen revealed descending thoracic aortic dissection. Her pain is well controlled after one dose of fentanyl. She says she takes her BP meds everyday given by her cousin which was at bedside before my evaluation. Did not take her medications tonight as she felt ill. Denies any dizziness , lightheadedness, chest pain or sob.  Past Medical History  htn , hld , dm , blindness  Significant Hospital Events   Patient states she is DNR and does not want any surgical procedures either.  Consults:  Vascular surgery    Significant Diagnostic Tests:  Ct chest abdomen - descending thoracic aortic aneurism and possible thrombus    Antimicrobials:  rocephin    Objective   Blood pressure (!) 182/92, pulse 91, temperature 97.7 F (36.5 C), temperature source Oral, resp. rate 16, SpO2 99 %.       No intake or output data in the 24 hours ending 08/17/18 0308 There were no vitals filed for this visit.  Examination: General: awake , alert and oriented to person , place and year HENT: b/l blind Lungs: cta bl ,  No wheezing or ronchi Cardiovascular: RRR , no m/r/g Abdomen: soft , bs + nt nd Extremities: no c/c/e Neuro: moves all ext , awake , follows commands GU: foley in place with purulent urine  Assessment and plan  Descending thoracic aortic dissection with possible thrombus - pain control , hypertensive emergency - BP control on cardene drip  Will target sbp < 140 for now , will also add her oral antihypertensives TTE in am Vascular surgery consult She does not want  surgery and is DNR/DNI Hold plavix  DM - NISS  HLD - statin and zetia  UTI - change foley catheter , has chronic indwelling. Rocephin  X 3 days , f/u urine cx  Goals of care : DNR/DNI   Best practice:  Diet: npo except meds Pain/Anxiety/Delirium protocol (if indicated): morphine prn VAP protocol (if indicated): n/a DVT prophylaxis: scd's GI prophylaxis: takes protonix home dose Glucose control: NISS Mobility: as tolerated Code Status: DNR DNI Family Communication: PATIENT is alert and oriented Disposition: ICu for BP control  Labs   CBC: Recent Labs  Lab 08/16/18 2312 08/16/18 2322  WBC 5.5  --   NEUTROABS 3.8  --   HGB 14.1 14.6  HCT 44.6 43.0  MCV 98.0  --   PLT 204  --     Basic Metabolic Panel: Recent Labs  Lab 08/16/18 2312 08/16/18 2322  NA 140 141  K 4.4 4.3  CL 106 104  CO2 27  --   GLUCOSE 128* 121*  BUN 8 9  CREATININE 0.92 0.90  CALCIUM 9.0  --    GFR: CrCl cannot be calculated (Unknown ideal weight.). Recent Labs  Lab 08/16/18 2312  WBC 5.5    Liver Function Tests: No results for input(s): AST, ALT, ALKPHOS, BILITOT, PROT, ALBUMIN in the last 168 hours. No results for input(s): LIPASE, AMYLASE in the last 168 hours. No results  for input(s): AMMONIA in the last 168 hours.  ABG    Component Value Date/Time   HCO3 24.1 (H) 09/13/2015 2319   TCO2 28 08/16/2018 2322   ACIDBASEDEF 2.2 (H) 09/13/2015 2319   O2SAT 60.4 09/13/2015 2319     Coagulation Profile: No results for input(s): INR, PROTIME in the last 168 hours.  Cardiac Enzymes: No results for input(s): CKTOTAL, CKMB, CKMBINDEX, TROPONINI in the last 168 hours.  HbA1C: Hemoglobin A1C  Date/Time Value Ref Range Status  09/14/2015 07:54 PM 9.1  Final  06/10/2015 12:22 PM 5.20  Final    CBG: No results for input(s): GLUCAP in the last 168 hours.  Review of Systems:   No nv/v/d, chest pain , sob  Rest of 12 point ros done and negative , pertinent positives are  mentioned in the HPI.  Past Medical History  She,  has a past medical history of Allergy, Arthritis, Blindness, Coronary artery disease, Diabetes mellitus without complication (HCC), Glaucoma, Hyperlipidemia, Hypertension, Myocardial infarction [facility]), Peripheral vascular disease (HCC), and Stroke (HCC).   Surgical History    Past Surgical History:  Procedure Laterality Date  . EYE SURGERY       Social History   reports that she quit smoking about 16 years ago. Her smoking use included cigarettes. She has never used smokeless tobacco. She reports that she does not drink alcohol or use drugs.   Family History   Her family history includes Alcohol abuse in her brother; Cancer in her brother and father; Diabetes in her brother, mother, and sister; Heart attack in her father; Hypertension in her father and sister; Peripheral vascular disease in her mother; Stroke in her mother; Varicose Veins in her brother and father.   Allergies Allergies  Allergen Reactions  . Milk-Related Compounds Other (See Comments)    Lactose intolerant.       Home Medications  Prior to Admission medications   Medication Sig Start Date End Date Taking? Authorizing Provider  calcium carbonate (OSCAL) 1500 (600 Ca) MG TABS tablet Take 1,500 mg by mouth daily with breakfast.   Yes [provider]  cloNIDine (CATAPRES) 0.2 MG tablet Take 1 tablet (0.2 mg total) by mouth daily. 01/21/16  Yes Runell Gess, MD  clopidogrel (PLAVIX) 75 MG tablet TAKE 1 TABLET (75 MG TOTAL) BY MOUTH DAILY. 02/28/18  Yes Runell Gess, MD  escitalopram (LEXAPRO) 5 MG tablet Take 5 mg by mouth daily.   Yes [provider]  ezetimibe (ZETIA) 10 MG tablet Take 1 tablet (10 mg total) by mouth daily. 03/19/18  Yes Runell Gess, MD  hydrALAZINE (APRESOLINE) 10 MG tablet Take 1 tablet (10 mg total) by mouth 2 (two) times daily. 03/19/18  Yes Runell Gess, MD  insulin glargine (LANTUS) 100 UNIT/ML injection Inject  0-0.1 mLs (0-10 Units total) into the skin at bedtime. Sliding scale Patient taking differently: Inject 0-10 Units into the skin See admin instructions. Per Sliding scale 11/12/15  Yes Holland Commons A, NP  insulin lispro (HUMALOG) 100 UNIT/ML injection INJECT 0.1 MILLILITERS (10 UNITS) INTO THE SKIN 3 TIMES A DAY WITH MEALS Patient taking differently: Inject 0-10 Units into the skin See admin instructions. Per Sliding Scale 3 TIMES A DAY WITH MEALS 10/29/16  Yes Weber, Sarah L, PA-C  labetalol (NORMODYNE) 200 MG tablet Take 200 mg by mouth 2 (two) times daily.   Yes [provider]  loratadine (CLARITIN) 10 MG tablet Take 10 mg by mouth daily.   Yes [provider]  pantoprazole (PROTONIX) 40 MG tablet TAKE 1 TABLET BY MOUTH EVERY DAY 02/13/18  Yes Runell GessBerry, Jonathan J, MD  simvastatin (ZOCOR) 20 MG tablet TAKE 1 TABLET BY MOUTH AT BEDTIME 03/12/18  Yes Runell GessBerry, Jonathan J, MD  Vitamin D, Ergocalciferol, (DRISDOL) 50000 units CAPS capsule Take 50,000 Units by mouth every Wednesday. 03/28/18  Yes [provider]  amLODipine (NORVASC) 10 MG tablet TAKE 1 TABLET (10 MG TOTAL) BY MOUTH DAILY. Patient not taking: Reported on 08/16/2018 06/13/18   Runell GessBerry, Jonathan J, MD  ciprofloxacin (CIPRO) 250 MG tablet Take 1 tablet (250 mg total) by mouth every 12 (twelve) hours. Patient not taking: Reported on 08/16/2018 04/02/18   Cecille PoMacklin, Nicholas W, MD  FREESTYLE LITE test strip CHECK 3 TIMES PER DAY 05/17/16   Quentin AngstJegede, Olugbemiga E, MD  Insulin Syringe-Needle U-100 (BD INSULIN SYRINGE ULTRAFINE) 31G X 15/64" 0.5 ML MISC Take insulin once nightly as needed 06/10/15   Ambrose FinlandKeck, Valerie A, NP  isosorbide mononitrate (IMDUR) 30 MG 24 hr tablet Take 0.5 tablets (15 mg total) by mouth daily. Patient not taking: Reported on 08/16/2018 01/30/18   Runell GessBerry, Jonathan J, MD  Lancets (FREESTYLE) lancets Check 3 times per day for E11.9 01/16/15   Holland CommonsKeck, Valerie A, NP  polyethylene glycol powder (GLYCOLAX/MIRALAX) powder  Take 17 g by mouth daily. Patient not taking: Reported on 08/16/2018 01/16/15   Holland CommonsKeck, Valerie A, NP  VOLTAREN 1 % GEL APPLY 4 GRAMS TOPICALLY 2 TIMES DAILY AS NEEDED. Patient not taking: Reported on 08/16/2018 04/25/16   Runell GessBerry, Jonathan J, MD     Critical care time: 6935 MINS

## 2018-08-18 DIAGNOSIS — E785 Hyperlipidemia, unspecified: Secondary | ICD-10-CM

## 2018-08-18 DIAGNOSIS — E1169 Type 2 diabetes mellitus with other specified complication: Secondary | ICD-10-CM

## 2018-08-18 DIAGNOSIS — I7101 Dissection of thoracic aorta: Principal | ICD-10-CM

## 2018-08-18 DIAGNOSIS — N390 Urinary tract infection, site not specified: Secondary | ICD-10-CM

## 2018-08-18 DIAGNOSIS — I161 Hypertensive emergency: Secondary | ICD-10-CM

## 2018-08-18 LAB — CBC
HCT: 42.7 % (ref 36.0–46.0)
HEMOGLOBIN: 13.5 g/dL (ref 12.0–15.0)
MCH: 30.5 pg (ref 26.0–34.0)
MCHC: 31.6 g/dL (ref 30.0–36.0)
MCV: 96.6 fL (ref 80.0–100.0)
Platelets: 196 10*3/uL (ref 150–400)
RBC: 4.42 MIL/uL (ref 3.87–5.11)
RDW: 12.4 % (ref 11.5–15.5)
WBC: 6.4 10*3/uL (ref 4.0–10.5)
nRBC: 0 % (ref 0.0–0.2)

## 2018-08-18 LAB — TYPE AND SCREEN
ABO/RH(D): A POS
Antibody Screen: NEGATIVE

## 2018-08-18 LAB — BASIC METABOLIC PANEL
Anion gap: 13 (ref 5–15)
BUN: 9 mg/dL (ref 8–23)
CO2: 22 mmol/L (ref 22–32)
Calcium: 8.7 mg/dL — ABNORMAL LOW (ref 8.9–10.3)
Chloride: 104 mmol/L (ref 98–111)
Creatinine, Ser: 0.84 mg/dL (ref 0.44–1.00)
GFR calc Af Amer: >60 mL/min (ref >60)
GFR calc non Af Amer: >60 mL/min (ref >60)
Glucose, Bld: 78 mg/dL (ref 70–99)
Potassium: 4.5 mmol/L (ref 3.5–5.1)
Sodium: 139 mmol/L (ref 135–145)

## 2018-08-18 LAB — GLUCOSE, CAPILLARY
Glucose-Capillary: 75 mg/dL (ref 70–99)
Glucose-Capillary: 96 mg/dL (ref 70–99)
Glucose-Capillary: 91 mg/dL (ref 70–99)
Glucose-Capillary: 77 mg/dL (ref 70–99)

## 2018-08-18 LAB — URINE CULTURE

## 2018-08-18 LAB — ABO/RH: ABO/RH(D): A POS

## 2018-08-18 MED ORDER — LABETALOL HCL 200 MG PO TABS
200.0000 mg | ORAL_TABLET | Freq: Two times a day (BID) | ORAL | Status: DC
  Administered 2018-08-18 – 2018-08-23 (×7): 200 mg via ORAL
  Filled 2018-08-18 (×10): qty 1

## 2018-08-18 MED ORDER — LABETALOL HCL 300 MG PO TABS
300.0000 mg | ORAL_TABLET | Freq: Two times a day (BID) | ORAL | Status: DC
  Administered 2018-08-18: 300 mg via ORAL
  Filled 2018-08-18: qty 1

## 2018-08-18 MED ORDER — CEPHALEXIN 500 MG PO CAPS
500.0000 mg | ORAL_CAPSULE | Freq: Three times a day (TID) | ORAL | Status: DC
  Administered 2018-08-18 – 2018-08-23 (×14): 500 mg via ORAL
  Filled 2018-08-18 (×18): qty 1

## 2018-08-18 MED ORDER — LABETALOL HCL 300 MG PO TABS: 300.0000 mg | ORAL_TABLET | Freq: Two times a day (BID) | ORAL | Status: DC

## 2018-08-18 NOTE — Plan of Care (Signed)

## 2018-08-18 NOTE — Progress Notes (Signed)
  Progress Note    08/18/2018 1:04 PM * No surgery found *  Subjective:  Pain resolved, tolerating po  Vitals:   08/18/18 1145 08/18/18 1200  BP:  (!) 92/44  Pulse:  (!) 56  Resp:  14  Temp: 97.6 F (36.4 C)   SpO2:  95%    Physical Exam: aaox3  Abdomen is soft Palpable femoral pulses bilaterally  CBC    Component Value Date/Time   WBC 6.4 08/18/2018 0345   RBC 4.42 08/18/2018 0345   HGB 13.5 08/18/2018 0345   HCT 42.7 08/18/2018 0345   PLT 196 08/18/2018 0345   MCV 96.6 08/18/2018 0345   MCH 30.5 08/18/2018 0345   MCHC 31.6 08/18/2018 0345   RDW 12.4 08/18/2018 0345   LYMPHSABS 1.0 08/16/2018 2312   MONOABS 0.5 08/16/2018 2312   EOSABS 0.1 08/16/2018 2312   BASOSABS 0.0 08/16/2018 2312    BMET    Component Value Date/Time   NA 139 08/18/2018 0345   K 4.5 08/18/2018 0345   CL 104 08/18/2018 0345   CO2 22 08/18/2018 0345   GLUCOSE 78 08/18/2018 0345   BUN 9 08/18/2018 0345   CREATININE 0.84 08/18/2018 0345   CREATININE 0.69 06/10/2015 1310   CALCIUM 8.7 (L) 08/18/2018 0345   GFRNONAA >60 08/18/2018 0345   GFRNONAA 81 06/10/2015 1310   GFRAA >60 08/18/2018 0345   GFRAA >89 06/10/2015 1310    INR No results found for: INR   Intake/Output Summary (Last 24 hours) at 08/18/2018 1304 Last data filed at 08/18/2018 1000 Gross per 24 hour  Intake 799.38 ml  Output 1100 ml  Net -300.62 ml     Assessment:  84 y.o. female is here with hypertension and distal thoracic dissection  Plan: bp control with goal sbp <140 Ok for diet as tolerates from vascular standpoint Will plan for repeat CTA likely tomorrow if she remains stable and wnl   Kitty Cadavid C. Randie Heinzain, MD [facility] [place] Office: [phone] Pager: [phone]  08/18/2018 1:04 PM

## 2018-08-18 NOTE — Progress Notes (Signed)
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  SBP in 200s on admission.  Admitted to ICU started on nicardipine drip.  Off nicardipine drip on 11/29.  Transferred to [facility] on 11/29.  Currently on oral antihypertensive medication.  Followed by vascular surgery.  No plan for surgical intervention currently.  SBP goal less than 140 per vascular surgery.  Subjective: No major events overnight.  Pressure mildly elevated to 156/114 this morning.  No complaints this morning.  She denies chest pain shortness of breath, back pain, abdominal, numbness or tingling  Assessment & Plan: Active Problems:   Aortic dissection [facility])   Hypertensive emergency  Hypertensive emergency with descending thoracic aortic dissection/possible mural thrombus: currently stable.  Back pain resolved. -Vascular surgery following.   -SBP goal less than 140 per vascular surgery. -Increase the labetalol to 300 mg twice daily. -On multiple oral antihypertensive regimen. -Echocardiogram with EF of 60 to 65%, G1 DD, moderate LAE and no significant aortic regurg -Pain well controlled. -Type and screen -Repeat CTA 12/1-12/2  UTI: Urinalysis concerning but urine culture with significant growth. -Transition to oral Keflex -Foley discontinued.  Diabetes -Sliding scale insulin -CBG monitoring  History of CAD/history of CVA/history of PAD/HLD: Stable -Continue home meds.  Small pulmonary nodule: Largest measuring 5 mm -Noncontrast CT chest in 12 months recommended.  Enlarged thyroid gland/goiter: Noted on CT -Outpatient follow-up  Scheduled  Meds: . cloNIDine  0.2 mg Oral Daily  . escitalopram  5 mg Oral Daily  . ezetimibe  10 mg Oral Daily  . fentaNYL (SUBLIMAZE) injection  50 mcg Intravenous Once  . hydrALAZINE  10 mg Oral BID  . insulin aspart  0-15 Units Subcutaneous TID AC & HS  . labetalol  300 mg Oral BID  . pantoprazole  40 mg Oral Daily  . simvastatin  20 mg Oral QHS   Continuous Infusions: . sodium chloride Stopped (08/18/18 0804)  . cefTRIAXone (ROCEPHIN)  IV Stopped (08/18/18 9562)  . lactated ringers Stopped (08/18/18 0753)  . niCARDipine Stopped (08/17/18 1031)   PRN Meds:.sodium chloride, acetaminophen, albuterol, morphine injection, ondansetron (ZOFRAN) IV  DVT prophylaxis: SCD Code Status: DNR Family Communication: No family member at bedside Disposition Plan: Remains inpatient.  Consultants:   Vascular surgery  Procedures:   Echocardiogram  Antimicrobials:  Ceftriaxone 11/29 to 11/30  Keflex 11/30->  Objective: Vitals:   08/18/18 0830 08/18/18 0900 08/18/18 0930 08/18/18 1000  BP: 136/84 (!) 134/53 (!) 110/53 (!) 116/59  Pulse: 79 64 61 67  Resp: 20 17 14 17   Temp:      TempSrc:      SpO2: 97% 97% 97% 93%  Weight:        Intake/Output Summary (Last 24 hours) at 08/18/2018 1031 Last data filed at 08/18/2018 1000 Gross per 24 hour  Intake 1090.63 ml  Output 1225 ml  Net -134.37 ml   Filed Weights   08/17/18 0500 08/18/18 0500  Weight: 64.5 kg 63.5 kg    Examination:  GENERAL: Appears well. No acute distress.  HEENT: MMM.  Legally blind.  Hearing grossly intact. NECK: Supple.   LUNGS:  No IWOB.  Fair air movement bilaterally HEART:  RRR. Heart sounds  normal.  2+ radial pulses bilaterally.  Faint DP pulses bilaterally. ABD: Bowel sounds present. Soft. Non tender.  EXT:   no edema bilaterally.  2+ radial pulses bilaterally.  Faint DP pulses bilaterally. SKIN: no apparent skin lesion.  NEURO: Awake, alert and oriented to self, place, person and year but not date of  month.  No gross deficit.  PSYCH: Calm. Normal affect.    Data Reviewed: I have independently reviewed following labs and imaging studies   CBC: Recent Labs  Lab 08/16/18 2312 08/16/18 2322 08/17/18 0528 08/18/18 0345  WBC 5.5  --  5.9 6.4  NEUTROABS 3.8  --   --   --   HGB 14.1 14.6 14.6 13.5  HCT 44.6 43.0 44.4 42.7  MCV 98.0  --  94.3 96.6  PLT 204  --  197 196   Basic Metabolic Panel: Recent Labs  Lab 08/16/18 2312 08/16/18 2322 08/17/18 0528 08/18/18 0345  NA 140 141 140 139  K 4.4 4.3 3.8 4.5  CL 106 104 108 104  CO2 27  --  25 22  GLUCOSE 128* 121* 95 78  BUN 8 9 7* 9  CREATININE 0.92 0.90 0.74 0.84  CALCIUM 9.0  --  9.2 8.7*  MG  --   --  1.8  --   PHOS  --   --  3.3  --    GFR: CrCl cannot be calculated (Unknown ideal weight.). Liver Function Tests: No results for input(s): AST, ALT, ALKPHOS, BILITOT, PROT, ALBUMIN in the last 168 hours. No results for input(s): LIPASE, AMYLASE in the last 168 hours. No results for input(s): AMMONIA in the last 168 hours. Coagulation Profile: No results for input(s): INR, PROTIME in the last 168 hours. Cardiac Enzymes: No results for input(s): CKTOTAL, CKMB, CKMBINDEX, TROPONINI in the last 168 hours. BNP (last 3 results) No results for input(s): PROBNP in the last 8760 hours. HbA1C: No results for input(s): HGBA1C in the last 72 hours. CBG: Recent Labs  Lab 08/17/18 0417 08/17/18 0803 08/17/18 1132 08/17/18 1624 08/17/18 2131  GLUCAP 122* 87 96 107* 153*   Lipid Profile: No results for input(s): CHOL, HDL, LDLCALC, TRIG, CHOLHDL, LDLDIRECT in the last 72 hours. Thyroid Function Tests: No results for input(s): TSH, T4TOTAL, FREET4, T3FREE, THYROIDAB in the last 72 hours. Anemia Panel: No results for input(s): VITAMINB12, FOLATE, FERRITIN, TIBC, IRON, RETICCTPCT in the last 72 hours. Urine analysis:    Component Value Date/Time   COLORURINE YELLOW 08/16/2018 2336   APPEARANCEUR HAZY (A) 08/16/2018 2336     LABSPEC 1.026 08/16/2018 2336   PHURINE 5.0 08/16/2018 2336   GLUCOSEU NEGATIVE 08/16/2018 2336   HGBUR MODERATE (A) 08/16/2018 2336   BILIRUBINUR NEGATIVE 08/16/2018 2336   BILIRUBINUR neg 07/18/2014 1320   KETONESUR NEGATIVE 08/16/2018 2336   PROTEINUR NEGATIVE 08/16/2018 2336   UROBILINOGEN 0.2 07/18/2014 1320   UROBILINOGEN 0.2 06/14/2014 1236   NITRITE NEGATIVE 08/16/2018 2336   LEUKOCYTESUR LARGE (A) 08/16/2018 2336   Sepsis Labs: Invalid input(s): PROCALCITONIN, LACTICIDVEN  Recent Results (from the past 240 hour(s))  Urine culture     Status: Abnormal   Collection Time: 08/17/18  2:03 AM  Result Value Ref Range Status   Specimen Description URINE, CATHETERIZED  Final   Special Requests NONE  Final   Culture (A)  Final    <10,000 COLONIES/mL INSIGNIFICANT GROWTH Performed at [facility], [address] [place], [place] [address]    Report Status 08/18/2018 FINAL  Final  MRSA PCR Screening     Status: None   Collection Time: 08/17/18  4:14 AM  Result Value Ref Range Status   MRSA by PCR NEGATIVE NEGATIVE Final    Comment:        The GeneXpert MRSA Assay (FDA approved for NASAL specimens only), is one component of a comprehensive MRSA colonization surveillance program. It is not intended to diagnose MRSA infection nor to guide or monitor treatment for MRSA infections. Performed at [facility], [address] [place], [place] [address]       Radiology Studies: No results found.    Orian Figueira T. [facility] Pager [phone]  If 7PM-7AM, please contact night-coverage [email or web address] Password [email or web address] 08/18/2018, 10:31 AM

## 2018-08-19 ENCOUNTER — Encounter (HOSPITAL_COMMUNITY): Payer: Self-pay | Admitting: Radiology

## 2018-08-19 ENCOUNTER — Inpatient Hospital Stay (HOSPITAL_COMMUNITY): Payer: Medicare Other

## 2018-08-19 DIAGNOSIS — I252 Old myocardial infarction: Secondary | ICD-10-CM

## 2018-08-19 DIAGNOSIS — Z8673 Personal history of transient ischemic attack (TIA), and cerebral infarction without residual deficits: Secondary | ICD-10-CM

## 2018-08-19 DIAGNOSIS — R339 Retention of urine, unspecified: Secondary | ICD-10-CM

## 2018-08-19 DIAGNOSIS — G3184 Mild cognitive impairment, so stated: Secondary | ICD-10-CM

## 2018-08-19 DIAGNOSIS — I739 Peripheral vascular disease, unspecified: Secondary | ICD-10-CM

## 2018-08-19 LAB — GLUCOSE, CAPILLARY
Glucose-Capillary: 73 mg/dL (ref 70–99)
Glucose-Capillary: 118 mg/dL — ABNORMAL HIGH (ref 70–99)
Glucose-Capillary: 104 mg/dL — ABNORMAL HIGH (ref 70–99)
Glucose-Capillary: 127 mg/dL — ABNORMAL HIGH (ref 70–99)

## 2018-08-19 LAB — BASIC METABOLIC PANEL
Anion gap: 9 (ref 5–15)
BUN: 13 mg/dL (ref 8–23)
CO2: 24 mmol/L (ref 22–32)
Calcium: 8.7 mg/dL — ABNORMAL LOW (ref 8.9–10.3)
Chloride: 104 mmol/L (ref 98–111)
Creatinine, Ser: 0.8 mg/dL (ref 0.44–1.00)
GFR calc Af Amer: >60 mL/min (ref >60)
GFR calc non Af Amer: >60 mL/min (ref >60)
Glucose, Bld: 98 mg/dL (ref 70–99)
Potassium: 5.2 mmol/L — ABNORMAL HIGH (ref 3.5–5.1)
Sodium: 137 mmol/L (ref 135–145)

## 2018-08-19 LAB — CBC
HCT: 40.6 % (ref 36.0–46.0)
Hemoglobin: 13 g/dL (ref 12.0–15.0)
MCH: 31 pg (ref 26.0–34.0)
MCHC: 32 g/dL (ref 30.0–36.0)
MCV: 96.7 fL (ref 80.0–100.0)
PLATELETS: 191 10*3/uL (ref 150–400)
RBC: 4.2 MIL/uL (ref 3.87–5.11)
RDW: 12.5 % (ref 11.5–15.5)
WBC: 4.4 10*3/uL (ref 4.0–10.5)
nRBC: 0 % (ref 0.0–0.2)

## 2018-08-19 LAB — HEMOGLOBIN A1C
Hgb A1c MFr Bld: 4.6 % — ABNORMAL LOW (ref 4.8–5.6)
Mean Plasma Glucose: 85.32 mg/dL

## 2018-08-19 MED ORDER — IOPAMIDOL (ISOVUE-370) INJECTION 76%
INTRAVENOUS | Status: AC
  Administered 2018-08-19: 80 mL
  Filled 2018-08-19: qty 100

## 2018-08-19 MED ORDER — CLONIDINE HCL 0.1 MG PO TABS
0.1000 mg | ORAL_TABLET | Freq: Two times a day (BID) | ORAL | Status: DC
  Administered 2018-08-19: 0.1 mg via ORAL
  Filled 2018-08-19 (×2): qty 1

## 2018-08-19 NOTE — Progress Notes (Addendum)
  Progress Note    08/19/2018 7:05 AM * No surgery found *  Subjective:  No overnight issues, denies pain  Vitals:   08/19/18 0500 08/19/18 0600  BP: (!) 152/65 (!) 146/57  Pulse: 64 (!) 59  Resp: 10 14  Temp:    SpO2: 99% 99%    Physical Exam: aaox3 Palpable left radial and femoral pulses Abdomen is soft  CBC    Component Value Date/Time   WBC 4.4 08/19/2018 0239   RBC 4.20 08/19/2018 0239   HGB 13.0 08/19/2018 0239   HCT 40.6 08/19/2018 0239   PLT 191 08/19/2018 0239   MCV 96.7 08/19/2018 0239   MCH 31.0 08/19/2018 0239   MCHC 32.0 08/19/2018 0239   RDW 12.5 08/19/2018 0239   LYMPHSABS 1.0 08/16/2018 2312   MONOABS 0.5 08/16/2018 2312   EOSABS 0.1 08/16/2018 2312   BASOSABS 0.0 08/16/2018 2312    BMET    Component Value Date/Time   NA 137 08/19/2018 0239   K 5.2 (H) 08/19/2018 0239   CL 104 08/19/2018 0239   CO2 24 08/19/2018 0239   GLUCOSE 98 08/19/2018 0239   BUN 13 08/19/2018 0239   CREATININE 0.80 08/19/2018 0239   CREATININE 0.69 06/10/2015 1310   CALCIUM 8.7 (L) 08/19/2018 0239   GFRNONAA >60 08/19/2018 0239   GFRNONAA 81 06/10/2015 1310   GFRAA >60 08/19/2018 0239   GFRAA >89 06/10/2015 1310    INR No results found for: INR   Intake/Output Summary (Last 24 hours) at 08/19/2018 0705 Last data filed at 08/18/2018 2200 Gross per 24 hour  Intake 485.39 ml  Output 1175 ml  Net -689.61 ml     Assessment:  84 y.o. female is here with thoracic aortic dissection, bp better controlled overnight without drips  Plan: Repeat CTA dissection protocol ordered for this morning.   Jemari Hallum C. Randie Heinzain, MD [facility] [place] Office: [phone] Pager: [phone]  08/19/2018 7:05 AM  Addendum: I reviewed CT scan from today which demonstrates stable appearing changes in her thoracic aorta.  After blood pressure is controlled she is safe for discharge from a vascular standpoint with no planned surgery.  I will see her in 8  to 10 weeks with repeat CT scan should she not have any further symptoms prior.  Lemar LivingsBrandon Prabhleen Montemayor

## 2018-08-19 NOTE — Plan of Care (Signed)

## 2018-08-19 NOTE — Progress Notes (Signed)
PROGRESS NOTE  Kaitlin Smith NGE:[id number] DOB: [birth year] DOA: 08/16/2018 PCP: Alysia Penna, MD   LOS: 2 days   Brief Narrative / Interim history: 84 year old female with history of hypertension, hyperlipidemia, diabetes, blindness, CAD/PAD, CVA and cognitive impairment admitted on 11/29 with back pain and found to have descending thoracic aortic dissection and mural thrombus involving the transverse and descending aorta.  SBP in 200s on admission.  Admitted to ICU started on nicardipine drip.  Off nicardipine drip on 11/29.  Transferred to [facility] on 11/29.  Currently on oral antihypertensive medication.  Followed by vascular surgery.  No plan for surgical intervention currently.  SBP goal less than 140 per vascular surgery.  Repeat CTA ordered on 08/19/2018 and pending.  Subjective: No major events overnight.  Concern about urinary retention yesterday.  Bladder scan showed 900 cc and had to in and out yesterday.  Apparently, patient came in with Foley placed by PCP on admission which was taken out to see if she can void on her own.  Blood pressure was somewhat low yesterday. No complaints this morning.  Denies chest pain, dyspnea, back pain, abdominal pain or dysuria.  Assessment & Plan: Active Problems:   Uncontrolled diabetes mellitus type 2 without complications (HCC)   History of CVA (cerebrovascular accident)   History of MI (myocardial infarction)   Peripheral arterial disease (HCC)   MCI (mild cognitive impairment)   Aortic dissection (HCC)   Hypertensive emergency  Hypertensive emergency with descending thoracic aortic dissection/possible mural thrombus: currently stable.  Back pain resolved. -Vascular surgery following.   -SBP goal less than 140 per vascular surgery-fairly controlled with p.o. labetalol, hydralazine and clonidine.  She is on clonidine 0.2 mg daily.  Will change this to 0.1 mg twice daily. -Echocardiogram with EF of 60 to 65%, G1 DD, moderate LAE and no  significant aortic regurg -Pain well controlled. -Type and screen -Repeat CTA 12/1-pending.  UTI: Urinalysis concerning but urine culture with significant growth. -Continue oral Keflex through 12/5  Urinary retention: Apparently came in with Foley from PCP office which was discontinued.  Bladder scan 900 cc.  Had I&O cath on 11/30. -Continue monitoring -In and out cath per protocol if needed. -We will consider low-dose bethanechol if no improvement.  Diabetes: CBG within normal range. -Check A1c -Sliding scale insulin -CBG monitoring  History of CAD/history of CVA/history of PAD/HLD: Stable -Continue home meds.  Small pulmonary nodule: Largest measuring 5 mm -Noncontrast CT chest in 12 months recommended.  Enlarged thyroid gland/goiter: Noted on CT -Outpatient follow-up  Scheduled Meds: . cephALEXin  500 mg Oral Q8H  . cloNIDine  0.2 mg Oral Daily  . escitalopram  5 mg Oral Daily  . ezetimibe  10 mg Oral Daily  . fentaNYL (SUBLIMAZE) injection  50 mcg Intravenous Once  . hydrALAZINE  10 mg Oral BID  . insulin aspart  0-15 Units Subcutaneous TID AC & HS  . labetalol  200 mg Oral BID  . pantoprazole  40 mg Oral Daily  . simvastatin  20 mg Oral QHS   Continuous Infusions: . sodium chloride Stopped (08/18/18 0804)  . lactated ringers Stopped (08/18/18 0753)   PRN Meds:.sodium chloride, acetaminophen, albuterol, morphine injection, ondansetron (ZOFRAN) IV  DVT prophylaxis: SCD Code Status: DNR Family Communication: No family member at bedside Disposition Plan: Remains inpatient.  Consultants:   Vascular surgery  Procedures:   Echocardiogram  Antimicrobials:  Ceftriaxone 11/29 to 11/30  Keflex 11/30->  Objective: Vitals:   08/19/18 0700 08/19/18 0915 08/19/18 1000  08/19/18 1100  BP: (!) 148/65 (!) 173/63 (!) 133/52 (!) 132/50  Pulse: 70     Resp: 14 18 13 14   Temp: 98.9 F (37.2 C)   98.6 F (37 C)  TempSrc: Oral   Oral  SpO2: 98% 98%  98%  Weight:         Intake/Output Summary (Last 24 hours) at 08/19/2018 1123 Last data filed at 08/19/2018 0800 Gross per 24 hour  Intake 480 ml  Output 1175 ml  Net -695 ml   Filed Weights   08/17/18 0500 08/18/18 0500 08/19/18 0500  Weight: 64.5 kg 63.5 kg 63.5 kg    Examination:  GENERAL: Appears well. No acute distress.  Sitting on bedside chair. HEENT: MMM.  Legally blind.  Hearing grossly intact. NECK: Supple.   LUNGS:  No IWOB.  Fair air movement bilaterally HEART:  RRR. Heart sounds normal.  2+ radial pulses bilaterally.  Faint DP pulses bilaterally. ABD: Bowel sounds present. Soft. Non tender.  EXT:   no edema bilaterally.  2+ radial pulses bilaterally.  Faint DP pulses bilaterally. SKIN: no apparent skin lesion.  NEURO: AAO x4.Marland Kitchen.  No gross deficit.  PSYCH: Calm. Normal affect.    Data Reviewed: I have independently reviewed following labs and imaging studies   CBC: Recent Labs  Lab 08/16/18 2312 08/16/18 2322 08/17/18 0528 08/18/18 0345 08/19/18 0239  WBC 5.5  --  5.9 6.4 4.4  NEUTROABS 3.8  --   --   --   --   HGB 14.1 14.6 14.6 13.5 13.0  HCT 44.6 43.0 44.4 42.7 40.6  MCV 98.0  --  94.3 96.6 96.7  PLT 204  --  197 196 191   Basic Metabolic Panel: Recent Labs  Lab 08/16/18 2312 08/16/18 2322 08/17/18 0528 08/18/18 0345 08/19/18 0239  NA 140 141 140 139 137  K 4.4 4.3 3.8 4.5 5.2*  CL 106 104 108 104 104  CO2 27  --  25 22 24   GLUCOSE 128* 121* 95 78 98  BUN 8 9 7* 9 13  CREATININE 0.92 0.90 0.74 0.84 0.80  CALCIUM 9.0  --  9.2 8.7* 8.7*  MG  --   --  1.8  --   --   PHOS  --   --  3.3  --   --    GFR: CrCl cannot be calculated (Unknown ideal weight.). Liver Function Tests: No results for input(s): AST, ALT, ALKPHOS, BILITOT, PROT, ALBUMIN in the last 168 hours. No results for input(s): LIPASE, AMYLASE in the last 168 hours. No results for input(s): AMMONIA in the last 168 hours. Coagulation Profile: No results for input(s): INR, PROTIME in the last  168 hours. Cardiac Enzymes: No results for input(s): CKTOTAL, CKMB, CKMBINDEX, TROPONINI in the last 168 hours. BNP (last 3 results) No results for input(s): PROBNP in the last 8760 hours. HbA1C: No results for input(s): HGBA1C in the last 72 hours. CBG: Recent Labs  Lab 08/18/18 0753 08/18/18 1143 08/18/18 1601 08/18/18 2013 08/19/18 0711  GLUCAP 75 96 91 77 73   Lipid Profile: No results for input(s): CHOL, HDL, LDLCALC, TRIG, CHOLHDL, LDLDIRECT in the last 72 hours. Thyroid Function Tests: No results for input(s): TSH, T4TOTAL, FREET4, T3FREE, THYROIDAB in the last 72 hours. Anemia Panel: No results for input(s): VITAMINB12, FOLATE, FERRITIN, TIBC, IRON, RETICCTPCT in the last 72 hours. Urine analysis:    Component Value Date/Time   COLORURINE YELLOW 08/16/2018 2336   APPEARANCEUR HAZY (A) 08/16/2018 2336  LABSPEC 1.026 08/16/2018 2336   PHURINE 5.0 08/16/2018 2336   GLUCOSEU NEGATIVE 08/16/2018 2336   HGBUR MODERATE (A) 08/16/2018 2336   BILIRUBINUR NEGATIVE 08/16/2018 2336   BILIRUBINUR neg 07/18/2014 1320   KETONESUR NEGATIVE 08/16/2018 2336   PROTEINUR NEGATIVE 08/16/2018 2336   UROBILINOGEN 0.2 07/18/2014 1320   UROBILINOGEN 0.2 06/14/2014 1236   NITRITE NEGATIVE 08/16/2018 2336   LEUKOCYTESUR LARGE (A) 08/16/2018 2336   Sepsis Labs: Invalid input(s): PROCALCITONIN, LACTICIDVEN  Recent Results (from the past 240 hour(s))  Urine culture     Status: Abnormal   Collection Time: 08/17/18  2:03 AM  Result Value Ref Range Status   Specimen Description URINE, CATHETERIZED  Final   Special Requests NONE  Final   Culture (A)  Final    <10,000 COLONIES/mL INSIGNIFICANT GROWTH Performed at [facility], [address], [place], [place] [address]    Report Status 08/18/2018 FINAL  Final  MRSA PCR Screening     Status: None   Collection Time: 08/17/18  4:14 AM  Result Value Ref Range Status   MRSA by PCR NEGATIVE NEGATIVE Final    Comment:        The  GeneXpert MRSA Assay (FDA approved for NASAL specimens only), is one component of a comprehensive MRSA colonization surveillance program. It is not intended to diagnose MRSA infection nor to guide or monitor treatment for MRSA infections. Performed at [facility], [address], [place], [place] [address]       Radiology Studies: No results found.   Anton Cheramie T. [facility] Pager [phone]  If 7PM-7AM, please contact night-coverage [email or web address] Password [email or web address] 08/19/2018, 11:23 AM

## 2018-08-20 ENCOUNTER — Other Ambulatory Visit: Payer: Self-pay

## 2018-08-20 ENCOUNTER — Encounter (HOSPITAL_COMMUNITY): Payer: Self-pay | Admitting: *Deleted

## 2018-08-20 DIAGNOSIS — R410 Disorientation, unspecified: Secondary | ICD-10-CM

## 2018-08-20 DIAGNOSIS — E1165 Type 2 diabetes mellitus with hyperglycemia: Secondary | ICD-10-CM

## 2018-08-20 LAB — CBC
HCT: 43.6 % (ref 36.0–46.0)
Hemoglobin: 14.1 g/dL (ref 12.0–15.0)
MCH: 30.7 pg (ref 26.0–34.0)
MCHC: 32.3 g/dL (ref 30.0–36.0)
MCV: 95 fL (ref 80.0–100.0)
Platelets: 203 10*3/uL (ref 150–400)
RBC: 4.59 MIL/uL (ref 3.87–5.11)
RDW: 12 % (ref 11.5–15.5)
WBC: 4.5 10*3/uL (ref 4.0–10.5)
nRBC: 0 % (ref 0.0–0.2)

## 2018-08-20 LAB — BASIC METABOLIC PANEL
Anion gap: 6 (ref 5–15)
BUN: 17 mg/dL (ref 8–23)
CO2: 31 mmol/L (ref 22–32)
Calcium: 9.2 mg/dL (ref 8.9–10.3)
Chloride: 102 mmol/L (ref 98–111)
Creatinine, Ser: 0.89 mg/dL (ref 0.44–1.00)
GFR calc Af Amer: >60 mL/min (ref >60)
GFR calc non Af Amer: 59 mL/min — ABNORMAL LOW (ref >60)
Glucose, Bld: 114 mg/dL — ABNORMAL HIGH (ref 70–99)
POTASSIUM: 4.3 mmol/L (ref 3.5–5.1)
Sodium: 139 mmol/L (ref 135–145)

## 2018-08-20 LAB — GLUCOSE, CAPILLARY
GLUCOSE-CAPILLARY: 100 mg/dL — AB (ref 70–99)
Glucose-Capillary: 121 mg/dL — ABNORMAL HIGH (ref 70–99)
Glucose-Capillary: 94 mg/dL (ref 70–99)
Glucose-Capillary: 101 mg/dL — ABNORMAL HIGH (ref 70–99)

## 2018-08-20 MED ORDER — CLONIDINE HCL 0.1 MG PO TABS
0.1000 mg | ORAL_TABLET | Freq: Once | ORAL | Status: AC
  Administered 2018-08-20: 0.1 mg via ORAL
  Filled 2018-08-20: qty 1

## 2018-08-20 MED ORDER — CLONIDINE HCL 0.2 MG PO TABS
0.2000 mg | ORAL_TABLET | Freq: Two times a day (BID) | ORAL | Status: DC
  Administered 2018-08-20 – 2018-08-23 (×7): 0.2 mg via ORAL
  Filled 2018-08-20 (×8): qty 1

## 2018-08-20 MED ORDER — QUETIAPINE FUMARATE 25 MG PO TABS
25.0000 mg | ORAL_TABLET | Freq: Once | ORAL | Status: AC
  Administered 2018-08-20: 25 mg via ORAL
  Filled 2018-08-20: qty 1

## 2018-08-20 MED ORDER — ENALAPRILAT 1.25 MG/ML IV SOLN
1.2500 mg | Freq: Once | INTRAVENOUS | Status: AC
  Administered 2018-08-20: 1.25 mg via INTRAVENOUS
  Filled 2018-08-20: qty 1

## 2018-08-20 NOTE — Evaluation (Signed)
Physical Therapy Evaluation Patient Details Name: Kaitlin Smith MRN: [id number] DOB: 28-Sep-1929 Today's Date: 08/20/2018   History of Present Illness  84 year old female with history of hypertension, hyperlipidemia, diabetes, blindness, CAD/PAD, CVA and cognitive impairment admitted on 11/29 with back pain and found to have descending thoracic aortic dissection   Clinical Impression  Orders received for PT evaluation. Patient demonstrates deficits in functional mobility as indicated below. Will benefit from continued skilled PT to address deficits and maximize function. Will see as indicated and progress as tolerated.  Patient WILL need 24/7 physical assist, if family unable to provide then will need SNF    Follow Up Recommendations Home health PT;Supervision/Assistance - 24 hour(if family cannot provide 24/7 physical assist will need SNF)    Equipment Recommendations  None recommended by PT    Recommendations for Other Services       Precautions / Restrictions Precautions Precautions: Fall      Mobility  Bed Mobility Overal bed mobility: Needs Assistance Bed Mobility: Rolling;Sidelying to Sit Rolling: Min assist Sidelying to sit: Min assist       General bed mobility comments: min assist to come to EOB and elevate trunk to upright.   Transfers Overall transfer level: Needs assistance Equipment used: Rolling walker (2 wheeled);1 person hand held assist Transfers: Sit to/from Stand Sit to Stand: Min assist         General transfer comment: Min assist to power up from bed and from toilet  Ambulation/Gait Ambulation/Gait assistance: Mod assist Gait Distance (Feet): 16 Feet(X2 once with RW and once with bilateral HHA) Assistive device: 2 person hand held assist;Rolling walker (2 wheeled) Gait Pattern/deviations: Step-to pattern;Decreased stride length;Shuffle;Trunk flexed Gait velocity: decreased   General Gait Details: requires hands on physical assist for all  aspects of mobility due to visual impairments and generalized weakness  Stairs            Wheelchair Mobility    Modified Rankin (Stroke Patients Only)       Balance Overall balance assessment: Needs assistance   Sitting balance-Leahy Scale: Fair Sitting balance - Comments: able to sit self supported   Standing balance support: During functional activity Standing balance-Leahy Scale: Poor Standing balance comment: reliance on external support                             Pertinent Vitals/Pain Pain Assessment: No/denies pain    Home Living Family/patient expects to be discharged to:: Private residence Living Arrangements: Other relatives Available Help at Discharge: Family Type of Home: House Home Access: Stairs to enter Entrance Stairs-Rails: Can reach both Entrance Stairs-Number of Steps: 6 Home Layout: One level Home Equipment: Environmental consultant - 4 wheels Additional Comments: patient reports that her niece "debra" assists her with mobility and daily tasks    Prior Function Level of Independence: Needs assistance   Gait / Transfers Assistance Needed: ambulates with 4 wheeled walker  ADL's / Homemaking Assistance Needed: debra assists with tasks        Hand Dominance   Dominant Hand: Right    Extremity/Trunk Assessment   Upper Extremity Assessment Upper Extremity Assessment: Generalized weakness    Lower Extremity Assessment Lower Extremity Assessment: Generalized weakness    Cervical / Trunk Assessment Cervical / Trunk Assessment: Kyphotic  Communication   Communication: HOH(visual deficits)  Cognition Arousal/Alertness: Awake/alert Behavior During Therapy: WFL for tasks assessed/performed Overall Cognitive Status: History of cognitive impairments - at baseline  General Comments      Exercises     Assessment/Plan    PT Assessment Patient needs continued PT services  PT Problem  List Decreased strength;Decreased activity tolerance;Decreased balance;Decreased mobility;Decreased cognition;Decreased safety awareness       PT Treatment Interventions DME instruction;Gait training;Functional mobility training;Therapeutic activities;Therapeutic exercise;Stair training;Balance training;Patient/family education    PT Goals (Current goals can be found in the Care Plan section)  Acute Rehab PT Goals Patient Stated Goal: to go home PT Goal Formulation: With patient Time For Goal Achievement: 09/03/18 Potential to Achieve Goals: Fair    Frequency Min 3X/week   Barriers to discharge        Co-evaluation               AM-PAC PT "6 Clicks" Mobility  Outcome Measure Help needed turning from your back to your side while in a flat bed without using bedrails?: A Little Help needed moving from lying on your back to sitting on the side of a flat bed without using bedrails?: A Little Help needed moving to and from a bed to a chair (including a wheelchair)?: A Little Help needed standing up from a chair using your arms (e.g., wheelchair or bedside chair)?: A Lot Help needed to walk in hospital room?: A Lot Help needed climbing 3-5 steps with a railing? : A Lot 6 Click Score: 15    End of Session Equipment Utilized During Treatment: Gait belt Activity Tolerance: Patient limited by fatigue Patient left: in chair;with call bell/phone within reach;with chair alarm set Nurse Communication: Mobility status PT Visit Diagnosis: Difficulty in walking, not elsewhere classified (R26.2);Muscle weakness (generalized) (M62.81)    Time: [id number] PT Time Calculation (min) (ACUTE ONLY): 21 min   Charges:   PT Evaluation $PT Eval Moderate Complexity: 1 Mod          Charlotte Crumbevon Kellis Mcadam, PT DPT  Board Certified Neurologic Specialist Acute Rehabilitation Services Pager [phone] Office [phone]   Fabio AsaDevon J Rae Sutcliffe 08/20/2018, 1:15 PM

## 2018-08-20 NOTE — Progress Notes (Signed)
  Progress Note    08/20/2018 9:47 AM * No surgery found *  Subjective: Denies chest, back, abdominal pain  Vitals:   08/20/18 0700 08/20/18 0800  BP: (!) 148/98 (!) 92/50  Pulse:    Resp: 16 19  Temp:  98 F (36.7 C)  SpO2:  98%    Physical Exam: Awake alert oriented Nonlabored respirations excellent abdomen is soft Bilateral femoral pulses are palpable Bilateral feet are warm   CBC    Component Value Date/Time   WBC 4.5 08/20/2018 0419   RBC 4.59 08/20/2018 0419   HGB 14.1 08/20/2018 0419   HCT 43.6 08/20/2018 0419   PLT 203 08/20/2018 0419   MCV 95.0 08/20/2018 0419   MCH 30.7 08/20/2018 0419   MCHC 32.3 08/20/2018 0419   RDW 12.0 08/20/2018 0419   LYMPHSABS 1.0 08/16/2018 2312   MONOABS 0.5 08/16/2018 2312   EOSABS 0.1 08/16/2018 2312   BASOSABS 0.0 08/16/2018 2312    BMET    Component Value Date/Time   NA 139 08/20/2018 0419   K 4.3 08/20/2018 0419   CL 102 08/20/2018 0419   CO2 31 08/20/2018 0419   GLUCOSE 114 (H) 08/20/2018 0419   BUN 17 08/20/2018 0419   CREATININE 0.89 08/20/2018 0419   CREATININE 0.69 06/10/2015 1310   CALCIUM 9.2 08/20/2018 0419   GFRNONAA 59 (L) 08/20/2018 0419   GFRNONAA 81 06/10/2015 1310   GFRAA >60 08/20/2018 0419   GFRAA >89 06/10/2015 1310    INR No results found for: INR   Intake/Output Summary (Last 24 hours) at 08/20/2018 0947 Last data filed at 08/20/2018 0830 Gross per 24 hour  Intake 600 ml  Output 2600 ml  Net -2000 ml     Assessment:  84 y.o. female is here with thoracic aortic dissection, bp spiked overnight  Plan: When blood pressure is controlled she is okay for discharge from vascular standpoint We will follow-up 8 to 10 weeks with repeat CTA dissection protocol  Kaitlin Smith C. Randie Heinzain, MD [facility] [place] Office: [phone] Pager: [phone]  08/20/2018 9:47 AM

## 2018-08-20 NOTE — Progress Notes (Signed)
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  SBP in 200s on admission.  Admitted to ICU started on nicardipine drip.  Off nicardipine drip on 11/29.  [facility].  Currently on oral antihypertensive medication.  Followed by vascular surgery.  No plan for surgical intervention currently.  SBP goal less than 140 per vascular surgery.  Repeat CTA dissection protocol on 08/19/2018 showed stable finding without change.  Cleared for discharge by vascular surgery on 08/20/2018 as long as the blood pressure remains stable.  Need to follow-up with vascular surgery 8 to 10 weeks with repeat CT dissection protocol.  Subjective: RN reports agitation overnight.  Applied mints as she was attempting to pull IV lines.  Had another episode of urinary retention.  1200 cc in and out cath.  Blood pressure has been up and down likely due to agitation.  She looks stable and calm this morning. Major complaints is about the mints on her hands.  Denies chest pain, shortness of breath, abdominal pain or back pain.  Assessment & Plan: Active Problems:   Uncontrolled diabetes mellitus type 2 without complications (HCC)   History of CVA (cerebrovascular accident)   History of MI (myocardial infarction)   Peripheral arterial disease (HCC)   MCI (mild cognitive impairment)   Aortic dissection (HCC)   Hypertensive emergency  Hypertensive emergency with descending thoracic aortic dissection/possible mural thrombus: On repeat CT on 08/19/2018 stable.  -Okay to discharge from vascular surgery standpoint as long as blood pressure stays  stable. -Follow-up with vascular surgery in 8 to 10 weeks with repeat CTA dissection protocol -Blood pressure has been up and down throughout the night likely due to agitation.  -Adjusted blood pressure medications today.  Goal SBP less than 140. -Echocardiogram with EF of 60 to 65%, G1 DD, moderate LAE and no significant aortic regurg -Pain well controlled. -PT eval-recommended 24/7 in-home supervision or SNF. Attempted to call family member to discuss this but no answer.   UTI: Urinalysis concerning but urine culture without significant growth. -Continue oral Keflex through 12/5  Urinary retention: apparently came in with Foley from PCP office which was discontinued. Failed voiding trial multiple times here.  Had over 1200 in and out cath yesterday.  Could definitely contribute to the agitation. -Place Foley -Discussed with Urology, Dr. Marlou PorchHerrick over the phone.  Okay to discharge with Foley catheter.  He will arrange outpatient follow-up in a week.  Diabetes: CBG within normal range. -Check A1c -Sliding scale insulin -CBG monitoring  History of CAD/history of CVA/history of PAD/HLD: Stable -Continue home meds.  Small pulmonary nodule: Largest measuring 5 mm -Noncontrast CT chest in 12 months recommended.  Enlarged thyroid gland/goiter: Noted on CT -Outpatient follow-up  Delirium/with underlying cognitive impairment: This is multifactorial.  Patient is legally blind.  Also with urinary retention and bladder with distention.  Unfamiliar environment as well. -Delirium precaution -Foley for urinary retention. -PRN Seroquel if combative or severe agiatation  Scheduled Meds: . cephALEXin  500 mg Oral Q8H  . cloNIDine  0.2 mg Oral BID  . escitalopram  5 mg Oral Daily  . ezetimibe  10 mg Oral Daily  . fentaNYL (SUBLIMAZE) injection  50 mcg Intravenous Once  . hydrALAZINE  10 mg Oral BID  . insulin aspart  0-15 Units Subcutaneous TID AC & HS  . labetalol  200 mg Oral BID  .  pantoprazole  40 mg Oral Daily  . simvastatin  20 mg Oral QHS   Continuous Infusions: . sodium chloride Stopped (08/18/18 0804)  . lactated ringers Stopped (08/18/18 0753)   PRN Meds:.sodium chloride, acetaminophen, albuterol, morphine injection, ondansetron (ZOFRAN) IV  DVT prophylaxis: SCD Code Status: DNR Family Communication: No family member at bedside.  Attempted to call patient's niece to discuss about disposition. No answer. Will attempt calling again later. Meanwhile, will consult social work.  Disposition Plan: Remains inpatient pending stabilization of blood pressure clarification on disposition.  Could not reach family member to discuss about disposition.  Consultants:   Vascular surgery  Procedures:   Echocardiogram  Antimicrobials:  Ceftriaxone 11/29 to 11/30  Keflex 11/30->  Objective: Vitals:   08/20/18 0800 08/20/18 0953 08/20/18 1000 08/20/18 1200  BP: (!) 92/50 134/84 (!) 119/98   Pulse:      Resp: 19  18   Temp: 98 F (36.7 C)   97.8 F (36.6 C)  TempSrc: Axillary   Axillary  SpO2: 98%     Weight:        Intake/Output Summary (Last 24 hours) at 08/20/2018 1352 Last data filed at 08/20/2018 0830 Gross per 24 hour  Intake 240 ml  Output 2450 ml  Net -2210 ml   Filed Weights   08/18/18 0500 08/19/18 0500 08/20/18 0405  Weight: 63.5 kg 63.5 kg 65.5 kg    Examination: GENERAL: Appears well. No acute distress.  Lying in bed HEENT: MMM.  Legally blind.  Hearing grossly intact. NECK: Supple.  LUNGS:  No IWOB.  Fair air movement bilaterally. HEART:  RRR. Heart sounds normal.  2+ radial pulses bilaterally.  Faint DP pulses bilaterally. ABD: Bowel sounds present. Soft. Non tender.  EXT:  no edema bilaterally.  Mints on both hands SKIN: no apparent skin lesion.  NEURO: Awake, alert and oriented x4.  No gross deficits. PSYCH: Calm. Normal affect.  Intermittent agitation per RN.    Data Reviewed: I have independently reviewed following labs and  imaging studies   CBC: Recent Labs  Lab 08/16/18 2312 08/16/18 2322 08/17/18 0528 08/18/18 0345 08/19/18 0239 08/20/18 0419  WBC 5.5  --  5.9 6.4 4.4 4.5  NEUTROABS 3.8  --   --   --   --   --   HGB 14.1 14.6 14.6 13.5 13.0 14.1  HCT 44.6 43.0 44.4 42.7 40.6 43.6  MCV 98.0  --  94.3 96.6 96.7 95.0  PLT 204  --  197 196 191 203   Basic Metabolic Panel: Recent Labs  Lab 08/16/18 2312 08/16/18 2322 08/17/18 0528 08/18/18 0345 08/19/18 0239 08/20/18 0419  NA 140 141 140 139 137 139  K 4.4 4.3 3.8 4.5 5.2* 4.3  CL 106 104 108 104 104 102  CO2 27  --  25 22 24 31   GLUCOSE 128* 121* 95 78 98 114*  BUN 8 9 7* 9 13 17   CREATININE 0.92 0.90 0.74 0.84 0.80 0.89  CALCIUM 9.0  --  9.2 8.7* 8.7* 9.2  MG  --   --  1.8  --   --   --   PHOS  --   --  3.3  --   --   --    GFR: CrCl cannot be calculated (Unknown ideal  weight.). Liver Function Tests: No results for input(s): AST, ALT, ALKPHOS, BILITOT, PROT, ALBUMIN in the last 168 hours. No results for input(s): LIPASE, AMYLASE in the last 168 hours. No results for input(s): AMMONIA in the last 168 hours. Coagulation Profile: No results for input(s): INR, PROTIME in the last 168 hours. Cardiac Enzymes: No results for input(s): CKTOTAL, CKMB, CKMBINDEX, TROPONINI in the last 168 hours. BNP (last 3 results) No results for input(s): PROBNP in the last 8760 hours. HbA1C: Recent Labs    08/19/18 0239  HGBA1C 4.6*   CBG: Recent Labs  Lab 08/19/18 1213 08/19/18 1613 08/19/18 2101 08/20/18 0700 08/20/18 1213  GLUCAP 118* 104* 127* 121* 100*   Lipid Profile: No results for input(s): CHOL, HDL, LDLCALC, TRIG, CHOLHDL, LDLDIRECT in the last 72 hours. Thyroid Function Tests: No results for input(s): TSH, T4TOTAL, FREET4, T3FREE, THYROIDAB in the last 72 hours. Anemia Panel: No results for input(s): VITAMINB12, FOLATE, FERRITIN, TIBC, IRON, RETICCTPCT in the last 72 hours. Urine analysis:    Component Value Date/Time    COLORURINE YELLOW 08/16/2018 2336   APPEARANCEUR HAZY (A) 08/16/2018 2336   LABSPEC 1.026 08/16/2018 2336   PHURINE 5.0 08/16/2018 2336   GLUCOSEU NEGATIVE 08/16/2018 2336   HGBUR MODERATE (A) 08/16/2018 2336   BILIRUBINUR NEGATIVE 08/16/2018 2336   BILIRUBINUR neg 07/18/2014 1320   KETONESUR NEGATIVE 08/16/2018 2336   PROTEINUR NEGATIVE 08/16/2018 2336   UROBILINOGEN 0.2 07/18/2014 1320   UROBILINOGEN 0.2 06/14/2014 1236   NITRITE NEGATIVE 08/16/2018 2336   LEUKOCYTESUR LARGE (A) 08/16/2018 2336   Sepsis Labs: Invalid input(s): PROCALCITONIN, LACTICIDVEN  Recent Results (from the past 240 hour(s))  Urine culture     Status: Abnormal   Collection Time: 08/17/18  2:03 AM  Result Value Ref Range Status   Specimen Description URINE, CATHETERIZED  Final   Special Requests NONE  Final   Culture (A)  Final    <10,000 COLONIES/mL INSIGNIFICANT GROWTH Performed at [facility], [address], [place], [place] [address]    Report Status 08/18/2018 FINAL  Final  MRSA PCR Screening     Status: None   Collection Time: 08/17/18  4:14 AM  Result Value Ref Range Status   MRSA by PCR NEGATIVE NEGATIVE Final    Comment:        The GeneXpert MRSA Assay (FDA approved for NASAL specimens only), is one component of a comprehensive MRSA colonization surveillance program. It is not intended to diagnose MRSA infection nor to guide or monitor treatment for MRSA infections. Performed at [facility], [address] [place], [place] [address]       Radiology Studies: No results found.   Hadiyah Maricle T. [facility] Pager [phone]  If 7PM-7AM, please contact night-coverage [email or web address] Password [email or web address] 08/20/2018, 1:52 PM

## 2018-08-21 ENCOUNTER — Ambulatory Visit: Payer: Federal, State, Local not specified - PPO | Admitting: Podiatry

## 2018-08-21 LAB — GLUCOSE, CAPILLARY
GLUCOSE-CAPILLARY: 83 mg/dL (ref 70–99)
GLUCOSE-CAPILLARY: 173 mg/dL — AB (ref 70–99)
Glucose-Capillary: 96 mg/dL (ref 70–99)
Glucose-Capillary: 79 mg/dL (ref 70–99)

## 2018-08-21 NOTE — Progress Notes (Signed)
14 Fr. Urinary Catheter placed per sterile protocol and per MD order.  Cloudy, yellow urine returned in tubing.  Toniann Failachel James, RN and Santa GeneraQuandra Kock,RN present during insertion.  Placed w/o difficulty.  Patient tolerated insertion well.

## 2018-08-21 NOTE — Care Management Note (Addendum)
Case Management Note  Patient Details  Name: Kaitlin Smith MRN: [id number] Date of Birth: [birth year]  Subjective/Objective:  84 yo female presented with back pain, found to have descending thoracic aortic dissection.                Action/Plan: CM following for transitional needs. PT eval completed with HHPT with 24/7 assist vs SNF recommended (if family unable to provide). Patient is legally blind with hx of CVA and cognitive impairment. CM attempted to contact patients niece, Kaitlin Smith, with no answer; VM left requesting a return call to discuss POC and confirm 24/7 assistance. CSW was consulted for possible SNF placement. CM team will continue to follow.   Expected Discharge Date:  08/27/18               Expected Discharge Plan:  Home w Home Health Services  In-House Referral:  Clinical Social Work  Discharge planning Services  CM Consult  Post Acute Care Choice:  Home Health Choice offered to:  (Niece who provides care presented with choice via phone; copy of choice placed in shadow chart and patients room)  DME Arranged:  N/A DME Agency:  NA  HH Arranged:  RN, Disease Management, PT HH [facility]  [facility]  Status of Service:  In process, will continue to follow  If discussed at Long Length of Stay Meetings, dates discussed:    Additional Comments: 08/22/18 @0953 -Colleen CanNatalie Sherl Yzaguirre RNCM-CB received from patients niece, Kaitlin Smith to discuss patients dispositional needs and PT recommendations. Patients niece confirmed her ability to provide 24/7 assistance; niece confirmed patient lives with her, with all patients finances and affairs handle by niece, with assistance provided by her niece as needed. CM discussed PT recommendations for HHPT, with niece agreeable; HH preference choice provided via phone, with [facility]; CM informed [facility] will be in patients room. [facility]; AVS updated. Patient has a RW for  mobility. PCP: Dr. Link SnufferHolwerda; pharmacy [facility] choice[facility]. Niece confirmed patient has Medicare Part A/BCBS PPO. Please enter orders for HHRN/PT/SW and F2F. Patients niece will provide transportation home. CM team will continue to follow.   Colleen CanNatalie Moiz Ryant RN, BSN, NCM-BC, ACM-RN [phone] 08/21/2018, 2:16 PM

## 2018-08-21 NOTE — Progress Notes (Addendum)
     Subjective  - Doing well no new complaints.   Objective (!) 136/52 (!) 54 98 F (36.7 C) (Oral) (!) 23 97%  Intake/Output Summary (Last 24 hours) at 08/21/2018 0805 Last data filed at 08/21/2018 0545 Gross per 24 hour  Intake 120 ml  Output 1931 ml  Net -1811 ml    Heart RRR Lungs non labored  Moving all 4 ext BP 130's/50's    Assessment/Planning: 84 y.o. female is here with thoracic aortic dissection Plan for repeat CTA in 8 weeks Maintain BP control.    Mosetta Pigeonmma Maureen Collins 08/21/2018 8:05 AM --  Laboratory Lab Results: Recent Labs    08/19/18 0239 08/20/18 0419  WBC 4.4 4.5  HGB 13.0 14.1  HCT 40.6 43.6  PLT 191 203   BMET Recent Labs    08/19/18 0239 08/20/18 0419  NA 137 139  K 5.2* 4.3  CL 104 102  CO2 24 31  GLUCOSE 98 114*  BUN 13 17  CREATININE 0.80 0.89  CALCIUM 8.7* 9.2    COAG No results found for: INR, PROTIME No results found for: PTT   I have independently interviewed and examined patient and agree with PA assessment and plan above.   Adian Jablonowski C. Randie Heinzain, MD [facility] [place] Office: [phone] Pager: [phone]

## 2018-08-21 NOTE — Progress Notes (Signed)
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  SBP in 200s on admission.  Admitted to ICU started on nicardipine drip.  Off nicardipine drip on 11/29.  [facility]  Currently on oral antihypertensive medication.  Followed by vascular surgery.  No plan for surgical intervention currently.  SBP goal less than 140 per vascular surgery.  Repeat CTA dissection protocol on 08/19/2018 showed stable finding without change.  Cleared for discharge by vascular surgery on 08/20/2018 as long as the blood pressure remains stable.  Need to follow-up with vascular surgery 8 to 10 weeks with repeat CT dissection protocol.  Subjective: No major events overnight.  Had an episode of agitation yesterday afternoon that has resolved with Seroquel. Calm overnight and this morning.  No complaints this morning.  Foley to be placed.   Assessment & Plan: Active Problems:   Uncontrolled diabetes mellitus type 2 without complications (HCC)   History of CVA (cerebrovascular accident)   History of MI (myocardial infarction)   Peripheral arterial disease (HCC)   MCI (mild cognitive impairment)   Aortic dissection (HCC)   Hypertensive emergency  Hypertensive emergency with descending thoracic aortic dissection/possible mural thrombus: On repeat CT on 08/19/2018 stable.  Blood pressure fairly controlled after made adjustment. -Okay to discharge from vascular surgery standpoint as long as blood pressure stays stable. -Follow-up with vascular surgery in 8 to 10 weeks with repeat CTA dissection protocol -Goal SBP less than  140. -Echocardiogram with EF of 60 to 65%, G1 DD, moderate LAE and no significant aortic regurg -Pain well controlled. -PT eval-recommended 24/7 in-home supervision or SNF. Attempted to call Niece multiple times 12/2 on 12/3 without response.  Care manager attempting as well.  UTI: Urinalysis concerning but urine culture without significant growth. -Continue oral Keflex through 12/5  Urinary retention: apparently came in with Foley from PCP office which was discontinued. Failed voiding trial multiple times here.  Had over 1200 in and out cath yesterday.  Could definitely contribute to the agitation. -Place Foley -Discussed with Urology, Dr. Marlou PorchHerrick over the phone 12/2.  Okay to discharge with Foley catheter.  He will arrange outpatient follow-up in a week.  Diabetes: CBG within normal range. -Check A1c -Sliding scale insulin -CBG monitoring  History of CAD/history of CVA/history of PAD/HLD: Stable -Continue home meds.  Small pulmonary nodule: Largest measuring 5 mm -Noncontrast CT chest in 12 months recommended.  Enlarged thyroid gland/goiter: Noted on CT -Outpatient follow-up  Delirium/with underlying cognitive impairment: Delirium resolved. -Delirium precaution -Foley for urinary retention. -PRN Seroquel if combative or severe agiatation  Scheduled Meds: . cephALEXin  500 mg Oral Q8H  . cloNIDine  0.2 mg Oral BID  . escitalopram  5 mg Oral Daily  . ezetimibe  10 mg Oral Daily  . fentaNYL (SUBLIMAZE) injection  50 mcg Intravenous Once  . hydrALAZINE  10 mg Oral BID  . insulin aspart  0-15 Units Subcutaneous TID AC & HS  . labetalol  200 mg Oral BID  . pantoprazole  40 mg Oral Daily  . simvastatin  20 mg Oral QHS   Continuous Infusions: . sodium chloride Stopped (08/18/18 0804)  . lactated ringers Stopped (08/18/18 0753)  PRN Meds:.sodium chloride, acetaminophen, albuterol, morphine injection, ondansetron (ZOFRAN) IV  DVT prophylaxis: SCD Code Status: DNR Family  Communication: No family member at bedside.  Attempted to reach patient's niece multiple times without success.  Social work and Sports coach following.  Disposition Plan: Medically ready for discharge pending clarification with Niece about 24/7 home supervision.  Otherwise, patient has to be discharged to SNF.  Consultants:   Vascular surgery  Procedures:   Echocardiogram  Antimicrobials:  Ceftriaxone 11/29 to 11/30  Keflex 11/30->  Objective: Vitals:   08/21/18 1200 08/21/18 1300 08/21/18 1400 08/21/18 1500  BP: (!) 92/47 (!) 109/48 (!) 105/49 (!) 108/49  Pulse: (!) 57 (!) 52 (!) 54 (!) 55  Resp: 17 16 (!) 24 19  Temp:      TempSrc:      SpO2: 99% 100% 98% 97%  Weight:        Intake/Output Summary (Last 24 hours) at 08/21/2018 1625 Last data filed at 08/21/2018 1500 Gross per 24 hour  Intake 0 ml  Output 886 ml  Net -886 ml   Filed Weights   08/19/18 0500 08/20/18 0405 08/21/18 0500  Weight: 63.5 kg 65.5 kg 63.7 kg    Examination: GENERAL: Appears well. No acute distress.  HEENT: MMM.  Legally blind.  Hearing grossly intact.  NECK: Supple.  No JVD.  LUNGS:  No IWOB. Good air movement. HEART:  RRR. Heart sounds normal.  2+ radial pulses bilaterally.  Faint DP pulses bilaterally. ABD: Bowel sounds present. Soft. Non tender.  EXT:   no edema bilaterally.  SKIN: no apparent skin lesion.  NEURO: Awake, alert and oriented x4.  No gross deficit.  PSYCH: Calm. Normal affect.  Data Reviewed: I have independently reviewed following labs and imaging studies   CBC: Recent Labs  Lab 08/16/18 2312 08/16/18 2322 08/17/18 0528 08/18/18 0345 08/19/18 0239 08/20/18 0419  WBC 5.5  --  5.9 6.4 4.4 4.5  NEUTROABS 3.8  --   --   --   --   --   HGB 14.1 14.6 14.6 13.5 13.0 14.1  HCT 44.6 43.0 44.4 42.7 40.6 43.6  MCV 98.0  --  94.3 96.6 96.7 95.0  PLT 204  --  197 196 191 203   Basic Metabolic Panel: Recent Labs  Lab 08/16/18 2312 08/16/18 2322 08/17/18 0528  08/18/18 0345 08/19/18 0239 08/20/18 0419  NA 140 141 140 139 137 139  K 4.4 4.3 3.8 4.5 5.2* 4.3  CL 106 104 108 104 104 102  CO2 27  --  25 22 24 31   GLUCOSE 128* 121* 95 78 98 114*  BUN 8 9 7* 9 13 17   CREATININE 0.92 0.90 0.74 0.84 0.80 0.89  CALCIUM 9.0  --  9.2 8.7* 8.7* 9.2  MG  --   --  1.8  --   --   --   PHOS  --   --  3.3  --   --   --    GFR: CrCl cannot be calculated (Unknown ideal weight.). Liver Function Tests: No results for input(s): AST, ALT, ALKPHOS, BILITOT, PROT, ALBUMIN in the last 168 hours. No results for input(s): LIPASE, AMYLASE in the last 168 hours. No results for input(s): AMMONIA in the last 168 hours. Coagulation Profile: No results for input(s): INR, PROTIME in the last 168 hours. Cardiac Enzymes: No results for input(s): CKTOTAL, CKMB, CKMBINDEX, TROPONINI in the last 168 hours. BNP (last 3 results) No results for input(s): PROBNP in the last  8760 hours. HbA1C: Recent Labs    08/19/18 0239  HGBA1C 4.6*   CBG: Recent Labs  Lab 08/20/18 1213 08/20/18 1627 08/20/18 2230 08/21/18 0701 08/21/18 1154  GLUCAP 100* 94 101* 83 96   Lipid Profile: No results for input(s): CHOL, HDL, LDLCALC, TRIG, CHOLHDL, LDLDIRECT in the last 72 hours. Thyroid Function Tests: No results for input(s): TSH, T4TOTAL, FREET4, T3FREE, THYROIDAB in the last 72 hours. Anemia Panel: No results for input(s): VITAMINB12, FOLATE, FERRITIN, TIBC, IRON, RETICCTPCT in the last 72 hours. Urine analysis:    Component Value Date/Time   COLORURINE YELLOW 08/16/2018 2336   APPEARANCEUR HAZY (A) 08/16/2018 2336   LABSPEC 1.026 08/16/2018 2336   PHURINE 5.0 08/16/2018 2336   GLUCOSEU NEGATIVE 08/16/2018 2336   HGBUR MODERATE (A) 08/16/2018 2336   BILIRUBINUR NEGATIVE 08/16/2018 2336   BILIRUBINUR neg 07/18/2014 1320   KETONESUR NEGATIVE 08/16/2018 2336   PROTEINUR NEGATIVE 08/16/2018 2336   UROBILINOGEN 0.2 07/18/2014 1320   UROBILINOGEN 0.2 06/14/2014 1236    NITRITE NEGATIVE 08/16/2018 2336   LEUKOCYTESUR LARGE (A) 08/16/2018 2336   Sepsis Labs: Invalid input(s): PROCALCITONIN, LACTICIDVEN  Recent Results (from the past 240 hour(s))  Urine culture     Status: Abnormal   Collection Time: 08/17/18  2:03 AM  Result Value Ref Range Status   Specimen Description URINE, CATHETERIZED  Final   Special Requests NONE  Final   Culture (A)  Final    <10,000 COLONIES/mL INSIGNIFICANT GROWTH Performed at [facility], [address], [place], [place] [address]    Report Status 08/18/2018 FINAL  Final  MRSA PCR Screening     Status: None   Collection Time: 08/17/18  4:14 AM  Result Value Ref Range Status   MRSA by PCR NEGATIVE NEGATIVE Final    Comment:        The GeneXpert MRSA Assay (FDA approved for NASAL specimens only), is one component of a comprehensive MRSA colonization surveillance program. It is not intended to diagnose MRSA infection nor to guide or monitor treatment for MRSA infections. Performed at [facility], [address] [place], [place] [address]       Radiology Studies: No results found.   Haddon Fyfe T. [facility] Pager [phone]  If 7PM-7AM, please contact night-coverage [email or web address] Password [email or web address] 08/21/2018, 4:25 PM

## 2018-08-21 NOTE — Progress Notes (Signed)
Pt transferred to [id number] via bed. Report given to Oscar LaLori Isley RN. B/P 123/50, hr 99, temp 99.3, rr 18.

## 2018-08-22 ENCOUNTER — Encounter (HOSPITAL_COMMUNITY): Payer: Self-pay

## 2018-08-22 LAB — GLUCOSE, CAPILLARY
Glucose-Capillary: 78 mg/dL (ref 70–99)
Glucose-Capillary: 117 mg/dL — ABNORMAL HIGH (ref 70–99)
Glucose-Capillary: 113 mg/dL — ABNORMAL HIGH (ref 70–99)
Glucose-Capillary: 99 mg/dL (ref 70–99)

## 2018-08-22 MED ORDER — CLONIDINE HCL 0.2 MG PO TABS: 0.2000 mg | ORAL_TABLET | Freq: Two times a day (BID) | ORAL | 6 refills | Status: DC

## 2018-08-22 MED ORDER — HYDRALAZINE HCL 25 MG PO TABS
25.0000 mg | ORAL_TABLET | Freq: Two times a day (BID) | ORAL | Status: DC
  Administered 2018-08-22 – 2018-08-23 (×3): 25 mg via ORAL
  Filled 2018-08-22 (×3): qty 1

## 2018-08-22 MED ORDER — ISOSORBIDE MONONITRATE ER 30 MG PO TB24
15.0000 mg | ORAL_TABLET | Freq: Every day | ORAL | Status: DC
  Administered 2018-08-22 – 2018-08-23 (×2): 15 mg via ORAL
  Filled 2018-08-22 (×2): qty 1

## 2018-08-22 MED ORDER — ACETAMINOPHEN 325 MG PO TABS: 650.0000 mg | ORAL_TABLET | Freq: Four times a day (QID) | ORAL | Status: AC | PRN

## 2018-08-22 MED ORDER — HYDRALAZINE HCL 25 MG PO TABS: 25.0000 mg | ORAL_TABLET | Freq: Two times a day (BID) | ORAL | 0 refills | Status: DC

## 2018-08-22 NOTE — Progress Notes (Signed)
  Progress Note    08/22/2018 2:03 PM * No surgery found *  Subjective: No complaints this morning  Vitals:   08/22/18 0021 08/22/18 0847  BP: (!) 153/66 (!) 143/74  Pulse: (!) 57 65  Resp: 20   Temp: 98.1 F (36.7 C) 97.9 F (36.6 C)  SpO2: 100% 100%    Physical Exam: Awake alert oriented Nonlabored respirations Palpable femoral pulses bilaterally Moving all 4 extremities well without limitation Abdomen soft nontender nondistended  CBC    Component Value Date/Time   WBC 4.5 08/20/2018 0419   RBC 4.59 08/20/2018 0419   HGB 14.1 08/20/2018 0419   HCT 43.6 08/20/2018 0419   PLT 203 08/20/2018 0419   MCV 95.0 08/20/2018 0419   MCH 30.7 08/20/2018 0419   MCHC 32.3 08/20/2018 0419   RDW 12.0 08/20/2018 0419   LYMPHSABS 1.0 08/16/2018 2312   MONOABS 0.5 08/16/2018 2312   EOSABS 0.1 08/16/2018 2312   BASOSABS 0.0 08/16/2018 2312    BMET    Component Value Date/Time   NA 139 08/20/2018 0419   K 4.3 08/20/2018 0419   CL 102 08/20/2018 0419   CO2 31 08/20/2018 0419   GLUCOSE 114 (H) 08/20/2018 0419   BUN 17 08/20/2018 0419   CREATININE 0.89 08/20/2018 0419   CREATININE 0.69 06/10/2015 1310   CALCIUM 9.2 08/20/2018 0419   GFRNONAA 59 (L) 08/20/2018 0419   GFRNONAA 81 06/10/2015 1310   GFRAA >60 08/20/2018 0419   GFRAA >89 06/10/2015 1310    INR No results found for: INR   Intake/Output Summary (Last 24 hours) at 08/22/2018 1403 Last data filed at 08/21/2018 1900 Gross per 24 hour  Intake -  Output 200 ml  Net -200 ml     Assessment:  84 y.o. female is here with thoracic aortic dissection  Plan: Okay for discharge from vascular standpoint with blood pressure control.  Follow-up 8 weeks with repeat CT angios.   Ilene Witcher C. Randie Heinzain, MD [facility] Office: [phone] Pager: [phone]  08/22/2018 2:03 PM

## 2018-08-22 NOTE — Discharge Summary (Signed)
Kaitlin Smith, is a 84 y.o. female  DOB 12/25/1929  MRN [id number].  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 Allergy   . Arthritis   . Blindness   . Coronary artery disease   . Diabetes mellitus without complication (HCC)   . Glaucoma   . Hyperlipidemia   . Hypertension   . Myocardial infarction (HCC)   . Peripheral vascular disease (HCC)   . Stroke [facility])     Past Surgical History:  Procedure Laterality Date  . EYE SURGERY         History of present illness and  Hospital Course:     Kindly see H&P for history of present illness and admission details, please review complete Labs, Consult reports and Test reports for all details in brief  HPI  from the history and physical done on the day of admission 08/17/2018  84 yo with hx of dm, htn , hld , blind presenting with upper back pain , ct of the chest and abdomen revealed descending thoracic aortic dissection. Her pain is  well controlled after one dose of fentanyl. She says she takes her BP meds everyday given by her cousin which was at bedside before my evaluation. Did not take her medications tonight as she felt ill. Denies any dizziness , lightheadedness, chest pain or sob.   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  SBP in 200s on admission.  Admitted to ICU started on nicardipine drip.  Off nicardipine drip on 11/29.  Transferred to [facility] on 11/29.  Currently on oral antihypertensive medication.  Followed by vascular surgery.  No plan for surgical intervention currently.  SBP goal less than 140 per vascular surgery.  Repeat CTA dissection protocol on 08/19/2018 showed stable finding without change.  Cleared for discharge by vascular surgery on 08/20/2018 as long as the blood pressure remains stable.  Need to follow-up with  vascular surgery 8 to 10 weeks with repeat CT dissection protocol.  Hypertensive emergency with descending thoracic aortic dissection/possible mural thrombus: On repeat CT on 08/19/2018 stable.  Blood pressure fairly controlled after made adjustment.  Hydralazine increased from 10 twice daily to 25 twice daily, clonidine as well increased to twice daily, amlodipine has been stopped, continue with Imdur and labetalol -Okay to discharge from vascular surgery standpoint as long as blood pressure stays stable. -Follow-up with vascular surgery in 8 to 10 weeks with repeat CTA dissection protocol -Goal SBP less than 140. -Echocardiogram with EF of 60 to 65%, G1 DD, moderate LAE and no significant aortic regurg -Pain well controlled. -PT eval-recommended 24/7 in-home supervision , case management discussed with niece, who will be able to provide this at  home,  -Okay to resume Plavix on discharge as discussed with vascular Dr. Randie Heinz  UTI: Urinalysis concerning but urine culture without significant growth. -Treated with Keflex during hospital stay, no further antibiotics requirement on discharge  Urinary retention: apparently came in with Foley from PCP office which was discontinued. Failed voiding trial multiple times here.  Had over 1200 in and out cath yesterday.  Could definitely contribute to the agitation. -Place Foley -Discussed with Urology, Dr. Marlou Porch over the phone 12/2.  Okay to discharge with Foley catheter.  He will arrange outpatient follow-up in a week.  Diabetes: CBG within normal range. -Continue with home regimen  History of CAD/history of nonhemorrhagic CVA/history of PAD/HLD: Stable -Continue home meds.  Small pulmonary nodule: Largest measuring 5 mm -Noncontrast CT chest in 12 months recommended.  Enlarged thyroid gland/goiter: Noted on CT -Outpatient follow-up  Delirium/with underlying cognitive impairment: Delirium resolved. -Delirium precaution -Foley for urinary retention.   Discharge Condition:  Stable at time of discharge  Follow UP  Follow-up Information    Maeola Harman, MD In 2 months.   Specialties:  Vascular Surgery, Cardiology Why:  Office will call you to arrange your appt (sent) Contact information: [address] [place] [address] [phone]        [facility] Follow up.   Specialty:  Home Health Services Why:  Home Health Registered Nurse, Physical Therapy and Social Worker Contact information: [address] [place] [address] [phone]        Crist Fat, MD Follow up.   Specialty:  Urology Contact information: [address] [place] [address] [phone]             Discharge Instructions  and  Discharge Medications   Discharge Instructions    Discharge instructions   Complete by:  As directed      weakness, dissecting aorta   Increase activity slowly   Complete by:  As directed      Allergies as of 08/23/2018      Reactions   Milk-related Compounds Other (See Comments)   Lactose intolerant.        Medication List    STOP taking these medications   ciprofloxacin 250 MG tablet Commonly known as:  CIPRO     TAKE these medications   acetaminophen 325 MG tablet Commonly known as:  TYLENOL Take 2 tablets (650 mg total) by mouth every 6 (six) hours as needed for mild pain (temp > 101.5).   amLODipine 10 MG tablet Commonly known as:  NORVASC TAKE 1 TABLET (10 MG TOTAL) BY MOUTH DAILY.   calcium carbonate 1500 (600 Ca) MG Tabs tablet Commonly known as:  OSCAL Take 1,500 mg  by mouth daily with breakfast.   cloNIDine 0.2 MG tablet Commonly known as:  CATAPRES Take 1 tablet (0.2 mg total) by mouth 2 (two) times daily. What changed:  when to take this   clopidogrel 75 MG tablet Commonly known as:  PLAVIX TAKE 1 TABLET (75 MG TOTAL) BY MOUTH DAILY.   escitalopram 5 MG tablet Commonly known as:  LEXAPRO Take 5 mg by mouth daily.   ezetimibe 10 MG tablet Commonly known as:  ZETIA Take 1 tablet (10 mg total) by mouth daily.   freestyle lancets Check 3 times per day for E11.9   FREESTYLE LITE test strip Generic drug:  glucose blood CHECK 3 TIMES PER DAY   hydrALAZINE 25 MG tablet Commonly known as:  APRESOLINE Take 1 tablet (25 mg total) by mouth 2 (two) times daily. What changed:    medication strength  how much to take   insulin glargine 100 UNIT/ML injection Commonly known as:  LANTUS Inject 0-0.1 mLs (0-10 Units total) into the skin at bedtime. Sliding scale What changed:    when to take this  additional instructions   insulin lispro 100 UNIT/ML injection Commonly known as:  HUMALOG INJECT 0.1 MILLILITERS (10 UNITS) INTO THE SKIN 3 TIMES A DAY WITH MEALS What changed:    how much to take  how to take this  when to take this  additional  instructions   Insulin Syringe-Needle U-100 31G X 15/64" 0.5 ML Misc Take insulin once nightly as needed   isosorbide mononitrate 30 MG 24 hr tablet Commonly known as:  IMDUR Take 0.5 tablets (15 mg total) by mouth daily.   labetalol 200 MG tablet Commonly known as:  NORMODYNE Take 200 mg by mouth 2 (two) times daily.   loratadine 10 MG tablet Commonly known as:  CLARITIN Take 10 mg by mouth daily.   pantoprazole 40 MG tablet Commonly known as:  PROTONIX TAKE 1 TABLET BY MOUTH EVERY DAY   polyethylene glycol powder powder Commonly known as:  GLYCOLAX/MIRALAX Take 17 g by mouth daily.   simvastatin 20 MG tablet Commonly known as:  ZOCOR TAKE 1 TABLET BY MOUTH AT BEDTIME   Vitamin D (Ergocalciferol) 1.25 MG (50000 UT) Caps capsule Commonly known as:  DRISDOL Take 50,000 Units by mouth every Wednesday.   VOLTAREN 1 % Gel Generic drug:  diclofenac sodium APPLY 4 GRAMS TOPICALLY 2 TIMES DAILY AS NEEDED.         Diet and Activity recommendation: See Discharge Instructions above   Consults obtained -  Vascular   Major procedures and Radiology Reports - PLEASE review detailed and final reports for all details, in brief -      Ct Angio Chest/abd/pel For Dissection W And/or W/wo  Result Date: 08/19/2018 CLINICAL DATA:  Follow-up thoracic aortic dissection. EXAM: CT ANGIOGRAPHY CHEST, ABDOMEN AND PELVIS TECHNIQUE: Multidetector CT imaging through the chest, abdomen and pelvis was performed using the standard protocol during bolus administration of intravenous contrast. Multiplanar reconstructed images and MIPs were obtained and reviewed to evaluate the vascular anatomy. CONTRAST:  80mL ISOVUE-370 IOPAMIDOL (ISOVUE-370) INJECTION 76% COMPARISON:  08/17/2018 FINDINGS: CTA CHEST FINDINGS Cardiovascular: There is no evidence of thoracic aortic intramural hematoma on precontrast imaging. Extensive calcified and soft plaque is again noted throughout the aorta. Irregular soft  plaque/mural thrombus is again seen in the aortic arch and descending aorta. Curvilinear filling defect projecting into the lumen of the distal descending thoracic aorta is unchanged from the recent prior study (series 6, image  101). A 1.5 cm penetrating ulcer along the posterior wall of the distal thoracic aorta is unchanged from the recent prior study was also present in 2015 (series 6, image 97). Coronary artery calcification and mild cardiomegaly are noted. There is a trace pericardial effusion or pericardial thickening anteriorly. Mediastinum/Nodes: Enlarged thyroid gland containing multiple nodules as previously seen. No enlarged axillary, mediastinal, or hilar lymph nodes. Lungs/Pleura: No pleural effusion or pneumothorax. 5 mm nodule in the superior segment of the left lower lobe (series 7, image 43), unchanged from the recent prior. Scattered small cystic areas or mild centrilobular emphysematous changes most notable in the lower lobes. Minimal atelectasis in the lung bases. Musculoskeletal: No acute osseous abnormality or suspicious osseous lesion. Thoracic spondylosis. Review of the MIP images confirms the above findings. CTA ABDOMEN AND PELVIS FINDINGS VASCULAR Aorta: Extensive calcified and soft plaque throughout the abdominal aorta without evidence of dissection or aneurysm. Celiac: Patent without evidence of aneurysm, dissection, vasculitis or significant stenosis. SMA: Patent with mild plaque near the origin without evidence of significant stenosis, aneurysm, or dissection. Renals: Patent single renal arteries bilaterally with atherosclerosis resulting in mild stenosis proximally on the right and moderate stenosis on the left. IMA: Patent with likely mild stenosis at its origin. Inflow: Patent with extensive predominantly calcified plaque without evidence of high-grade stenosis or dissection. Veins: No obvious venous abnormality within the limitations of this arterial phase study. Review of the MIP  images confirms the above findings. NON-VASCULAR Hepatobiliary: No focal liver abnormality is identified. High density material again noted in the gallbladder. No biliary dilatation. Pancreas: Borderline ductal dilatation in the pancreatic head. No acute inflammation. Spleen: Unremarkable. Adrenals/Urinary Tract: Unremarkable adrenal glands. No hydronephrosis. Unchanged 1.2 cm right lower pole renal lesion with fluid attenuation better demonstrated on the prior study compatible with a cyst. Subcentimeter right upper pole low-density renal lesion, too small to fully characterize. Interval removal of Foley catheter with nondependent gas in the bladder. Stomach/Bowel: The stomach is collapsed proximally. There is no evidence of bowel obstruction. A moderate amount of stool is noted in the rectum, less than on the prior study. Left-sided colonic diverticulosis is noted without evidence of diverticulitis. Lymphatic: No enlarged lymph nodes. Reproductive: Uterine fibroids.  No adnexal mass. Other: Unchanged hazy soft tissue density within the presacral fat. No intraperitoneal free fluid. Musculoskeletal: Advanced lumbar disc degeneration. No suspicious osseous lesion or acute osseous abnormality. Review of the MIP images confirms the above findings. IMPRESSION: 1. Unchanged curvilinear filling defect in the distal descending thoracic aorta which may reflect irregular mural thrombus or a small dissection flap. No interval extension. 2. Unchanged extensive thoracoabdominal aortic atherosclerosis. 3. Unchanged 5 mm left lower lobe lung nodule. See recommendations on prior CTA. 4. Unchanged high density material in the gallbladder which may reflect sludge. 5. Decreased rectal stool. Aortic Atherosclerosis (ICD10-I70.0). Electronically Signed   By: Sebastian Ache M.D.   On: 08/19/2018 14:47   Ct Angio Chest/abd/pel For Dissection W And/or Wo Contrast  Result Date: 08/17/2018 CLINICAL DATA:  84 year old with back pain.  Initial exam. Patient reports catheter causing discomfort and dark urine. EXAM: CT ANGIOGRAPHY CHEST, ABDOMEN AND PELVIS TECHNIQUE: Multidetector CT imaging through the chest, abdomen and pelvis was performed using the standard protocol during bolus administration of intravenous contrast. Multiplanar reconstructed images and MIPs were obtained and reviewed to evaluate the vascular anatomy. CONTRAST:  ISOVUE-370 IOPAMIDOL (ISOVUE-370) INJECTION 76% COMPARISON:  Chest radiograph 05/19/2017. Abdominal CT 06/14/2014 FINDINGS: CTA CHEST FINDINGS Cardiovascular: Diffuse calcified and  noncalcified atheromatous plaque throughout the thoracic aorta which is tortuous. There is irregular mural thrombus in the transverse and descending aorta. An area of linear hypodensity in the distal descending aorta just proximal to the diaphragmatic hiatus has changed in morphology from 2015 abdominal CT, not extending into the aortic lumen, images 101 through 107 series 7. There is no periaortic stranding. No mural hematoma. Coronary artery calcifications. Mild cardiomegaly. Focal pericardial thickening or small pericardial effusion anteriorly. No central pulmonary embolus. Mediastinum/Nodes: No enlarged mediastinal or hilar lymph nodes. Enlarged heterogeneous thyroid gland with multiple nodules and calcifications consistent with goiter. Distal esophagus is patulous. Lungs/Pleura: Basilar hypoventilatory changes. Small pulmonary cysts in the right lower lobe are unchanged from prior exam. No pulmonary edema or pleural fluid. 5 mm nodule superior segment of the left lower lobe image 23 series 6. Tiny subpleural nodule left upper lobe anteriorly image 27. Musculoskeletal: There are no acute or suspicious osseous abnormalities. Ordinary degenerative change in the spine. Review of the MIP images confirms the above findings. CTA ABDOMEN AND PELVIS FINDINGS VASCULAR Aorta: Linear intraluminal defect in the distal thoracic aorta does not  extend into the abdominal aorta. Diffusely calcified noncalcified atheromatous plaque. No dissection, aneurysm or evidence of vasculitis. Celiac: Patent without evidence of aneurysm, dissection, vasculitis or significant stenosis. SMA: Mild plaque at the origin without dissection, vasculitis or significant stenosis. No aneurysm. Renals: Both renal arteries are patent without evidence of aneurysm, dissection, vasculitis, fibromuscular dysplasia or significant stenosis. IMA: Patent without evidence of aneurysm, dissection, vasculitis or significant stenosis. Inflow: Diffuse calcified and noncalcified plaque without dissection, severe stenosis or vasculitis. Included left superficial femoral artery is small in caliber. Veins: No obvious venous abnormality within the limitations of this arterial phase study. Review of the MIP images confirms the above findings. NON-VASCULAR Hepatobiliary: No focal hepatic abnormality, low-density lesions on prior exam not seen on arterial phase imaging. High-density gallbladder contents. No pericholecystic inflammation. No gross biliary dilatation. Pancreas: No ductal dilatation or inflammation. Spleen: Normal in size. Normal arterial enhancement. Adrenals/Urinary Tract: No adrenal nodule. No hydronephrosis or perinephric edema. Simple cyst in the lower right kidney. Foley catheter in the urinary bladder which is nondistended. Mild perivesicular edema. Stomach/Bowel: The stomach is nondistended. No small bowel dilatation or inflammation. Normal appendix. Moderate stool burden throughout the colon. Colonic diverticulosis without diverticulitis. Stool distends the rectum with wall thickening, suspicious for fecal impaction. Lymphatic: No suspicious adenopathy. Reproductive: A calcified uterine fibroid. No adnexal mass. Other: Prior presacral soft tissue densities have diminished, residual primarily fat density with minimal with soft tissue component. No ascites, no free air.  Musculoskeletal: Advanced degenerative change in the lumbar spine. There are no acute or suspicious osseous abnormalities. Review of the MIP images confirms the above findings. IMPRESSION: 1. Diffuse thoracoabdominal aortic atherosclerosis with calcified and noncalcified plaque. There is irregular mural thrombus involving the transverse and descending aorta. 2. Focal curvilinear hypodensity in the distal descending thoracic aorta just proximal to the diaphragmatic hiatus has changed in morphology from 2015 CT, now extending into the aortic lumen. May represent irregular mural thrombus or atypical dissection, age indeterminate. No periaortic stranding. 3. Foley catheter in the urinary bladder, mild perivesicular stranding may represent urinary tract infection. 4. Small pulmonary nodules largest measuring 5 mm. No follow-up needed if patient is low-risk (and has no known or suspected primary neoplasm). Non-contrast chest CT can be considered in 12 months if patient is high-risk. This recommendation follows the consensus statement: Guidelines for Management of Incidental Pulmonary Nodules Detected  on CT Images: From the Fleischner Society 2017; Radiology 2017; 284:228-243. 5. High-density gallbladder contents, likely sludge. No pericholecystic inflammation. 6. Colonic diverticulosis without diverticulitis. Stool distending the rectum with rectal wall thickening, suspicious for fecal impaction. 7. Enlarged thyroid gland with multiple nodules and calcifications consistent with goiter. Aortic Atherosclerosis (ICD10-I70.0). These results were called by telephone at the time of interpretation on 08/17/2018 at 1:25 am to Dr. Rochele Raring , who verbally acknowledged these results. Electronically Signed   By: Narda Rutherford M.D.   On: 08/17/2018 01:25    Micro Results     Recent Results (from the past 240 hour(s))  Urine culture     Status: Abnormal   Collection Time: 08/17/18  2:03 AM  Result Value Ref Range  Status   Specimen Description URINE, CATHETERIZED  Final   Special Requests NONE  Final   Culture (A)  Final    <10,000 COLONIES/mL INSIGNIFICANT GROWTH Performed at [facility], [address] [place], [place] [address]    Report Status 08/18/2018 FINAL  Final  MRSA PCR Screening     Status: None   Collection Time: 08/17/18  4:14 AM  Result Value Ref Range Status   MRSA by PCR NEGATIVE NEGATIVE Final    Comment:        The GeneXpert MRSA Assay (FDA approved for NASAL specimens only), is one component of a comprehensive MRSA colonization surveillance program. It is not intended to diagnose MRSA infection nor to guide or monitor treatment for MRSA infections. Performed at [facility], [address], [place], [place] [address]        Today   Subjective:   Kaitlin Smith today has no headache,no chest or abdominal pain,no new weakness tingling or numbness, feels much better wants to go home today.   Objective:   Blood pressure (!) 156/65, pulse 61, temperature 98.6 F (37 C), temperature source Oral, resp. rate 20, weight 64 kg, SpO2 100 %.   Intake/Output Summary (Last 24 hours) at 08/23/2018 1108 Last data filed at 08/23/2018 0600 Gross per 24 hour  Intake 60 ml  Output 800 ml  Net -740 ml    Exam Awake Alert, pleasant, laying in bed in no apparent distress Symmetrical Chest wall movement, Good air movement bilaterally, CTAB RRR,No Gallops,Rubs or new Murmurs, No Parasternal Heave +ve B.Sounds, Abd Soft, Non tender, No rebound -guarding or rigidity. No Cyanosis, Clubbing or edema, No new Rash or bruise  Data Review   CBC w Diff:  Lab Results  Component Value Date   WBC 4.5 08/20/2018   HGB 14.1 08/20/2018   HCT 43.6 08/20/2018   PLT 203 08/20/2018   LYMPHOPCT 19 08/16/2018   MONOPCT 9 08/16/2018   EOSPCT 3 08/16/2018   BASOPCT 0 08/16/2018    CMP:  Lab Results  Component Value Date   NA 139 08/20/2018   K 4.3 08/20/2018   CL 102  08/20/2018   CO2 31 08/20/2018   BUN 17 08/20/2018   CREATININE 0.89 08/20/2018   CREATININE 0.69 06/10/2015   PROT 5.6 (L) 04/02/2018   ALBUMIN 2.9 (L) 04/02/2018   BILITOT 0.5 04/02/2018   ALKPHOS 81 04/02/2018   AST 17 04/02/2018   ALT 11 04/02/2018  .   Total Time in preparing paper work, data evaluation and todays exam - 35 minutes  Huey Bienenstock M.D on 08/23/2018 at 11:08 AM  [facility]   Office  [phone]

## 2018-08-22 NOTE — Progress Notes (Signed)
Physical Therapy Treatment Patient Details Name: Kaitlin Smith MRN: [id number] DOB: 1929/12/11 Today's Date: 08/22/2018    History of Present Illness 84 year old female with history of hypertension, hyperlipidemia, diabetes, blindness, CAD/PAD, CVA and cognitive impairment admitted on 11/29 with back pain and found to have descending thoracic aortic dissection     PT Comments    Patient doing well with therapy today, resting bp 119/62. Niece present who she will be living with, confirms her availability and confidence to guard patient as she needs hands on support while walking mainly due to vision and weakness . Ambulating short distances in room back to bed with contact guard, BP 117/60 after walking. No complaints today. HHPT appropriate.     Follow Up Recommendations  Home health PT;Supervision/Assistance - 24 hour     Equipment Recommendations  None recommended by PT    Recommendations for Other Services       Precautions / Restrictions Precautions Precautions: Fall Restrictions Weight Bearing Restrictions: No    Mobility  Bed Mobility Overal bed mobility: Needs Assistance Bed Mobility: Rolling;Sidelying to Sit Rolling: Min assist Sidelying to sit: Min assist       General bed mobility comments: min assist to come to EOB and elevate trunk to upright.   Transfers Overall transfer level: Needs assistance Equipment used: Rolling walker (2 wheeled);1 person hand held assist Transfers: Sit to/from Stand Sit to Stand: Min assist         General transfer comment: Min assist to power up from bed and from toilet  Ambulation/Gait Ambulation/Gait assistance: Min assist Gait Distance (Feet): 15 Feet Assistive device: Rolling walker (2 wheeled) Gait Pattern/deviations: Step-to pattern Gait velocity: decreased   General Gait Details: hands on guarding and walker mgt to avoid obsticales   Stairs             Wheelchair Mobility    Modified Rankin (Stroke  Patients Only)       Balance Overall balance assessment: Needs assistance   Sitting balance-Leahy Scale: Fair Sitting balance - Comments: able to sit self supported   Standing balance support: During functional activity Standing balance-Leahy Scale: Poor                              Cognition Arousal/Alertness: Awake/alert Behavior During Therapy: WFL for tasks assessed/performed Overall Cognitive Status: History of cognitive impairments - at baseline                                        Exercises      General Comments        Pertinent Vitals/Pain Pain Assessment: No/denies pain    Home Living                      Prior Function            PT Goals (current goals can now be found in the care plan section) Acute Rehab PT Goals Patient Stated Goal: to go home PT Goal Formulation: With patient Time For Goal Achievement: 09/03/18 Potential to Achieve Goals: Fair Progress towards PT goals: Progressing toward goals    Frequency    Min 3X/week      PT Plan Current plan remains appropriate    Co-evaluation              AM-PAC PT "6 Clicks" Mobility  Outcome Measure  Help needed turning from your back to your side while in a flat bed without using bedrails?: A Little Help needed moving from lying on your back to sitting on the side of a flat bed without using bedrails?: A Little Help needed moving to and from a bed to a chair (including a wheelchair)?: A Little Help needed standing up from a chair using your arms (e.g., wheelchair or bedside chair)?: A Lot Help needed to walk in hospital room?: A Lot Help needed climbing 3-5 steps with a railing? : A Lot 6 Click Score: 15    End of Session Equipment Utilized During Treatment: Gait belt Activity Tolerance: Patient limited by fatigue Patient left: in chair;with call bell/phone within reach;with chair alarm set Nurse Communication: Mobility status PT Visit  Diagnosis: Difficulty in walking, not elsewhere classified (R26.2);Muscle weakness (generalized) (M62.81)     Time: 9604-5409 PT Time Calculation (min) (ACUTE ONLY): 18 min  Charges:  $Gait Training: 8-22 mins                     Etta Grandchild, PT, DPT Acute Rehabilitation Services Pager: [phone] Office: [phone]     Etta Grandchild 08/22/2018, 4:14 PM

## 2018-08-22 NOTE — Discharge Instructions (Signed)
Follow with Primary MD Holwerda, Scott, MD in 7 days  ° °Get CBC, CMP, checked  by Primary MD next visit.  ° ° °Activity: As tolerated with Full fall precautions use walker/cane & assistance as needed ° ° °Disposition Home  ° ° °Diet: Heart Healthy  , with feeding assistance and aspiration precautions. ° °For Heart failure patients - Check your Weight same time everyday, if you gain over 2 pounds, or you develop in leg swelling, experience more shortness of breath or chest pain, call your Primary MD immediately. Follow Cardiac Low Salt Diet and 1.5 lit/day fluid restriction. ° ° °On your next visit with your primary care physician please Get Medicines reviewed and adjusted. ° ° °Please request your Prim.MD to go over all Hospital Tests and Procedure/Radiological results at the follow up, please get all Hospital records sent to your Prim MD by signing hospital release before you go home. ° ° °If you experience worsening of your admission symptoms, develop shortness of breath, life threatening emergency, suicidal or homicidal thoughts you must seek medical attention immediately by calling 911 or calling your MD immediately  if symptoms less severe. ° °You Must read complete instructions/literature along with all the possible adverse reactions/side effects for all the Medicines you take and that have been prescribed to you. Take any new Medicines after you have completely understood and accpet all the possible adverse reactions/side effects.  ° °Do not drive, operating heavy machinery, perform activities at heights, swimming or participation in water activities or provide baby sitting services if your were admitted for syncope or siezures until you have seen by Primary MD or a Neurologist and advised to do so again. ° °Do not drive when taking Pain medications.  ° ° °Do not take more than prescribed Pain, Sleep and Anxiety Medications ° °Special Instructions: If you have smoked or chewed Tobacco  in the last 2 yrs  please stop smoking, stop any regular Alcohol  and or any Recreational drug use. ° °Wear Seat belts while driving. ° ° °Please note ° °You were cared for by a hospitalist during your hospital stay. If you have any questions about your discharge medications or the care you received while you were in the hospital after you are discharged, you can call the unit and asked to speak with the hospitalist on call if the hospitalist that took care of you is not available. Once you are discharged, your primary care physician will handle any further medical issues. Please note that NO REFILLS for any discharge medications will be authorized once you are discharged, as it is imperative that you return to your primary care physician (or establish a relationship with a primary care physician if you do not have one) for your aftercare needs so that they can reassess your need for medications and monitor your lab values. ° °

## 2018-08-22 NOTE — Care Management Important Message (Signed)
Important Message  Patient Details  Name: Kaitlin Smith MRN: [id number] Date of Birth: 31-Jul-1930   Medicare Important Message Given:  Yes    Judah Carchi 08/22/2018, 1:46 PM

## 2018-08-22 NOTE — Progress Notes (Signed)
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  SBP in 200s on admission.  Admitted to ICU started on nicardipine drip.  Off nicardipine drip on 11/29.  [facility]  Currently on oral antihypertensive medication.  Followed by vascular surgery.  No plan for surgical intervention currently.  SBP goal less than 140 per vascular surgery.  Repeat CTA dissection protocol on 08/19/2018 showed stable finding without change.  Cleared for discharge by vascular surgery on 08/20/2018 as long as the blood pressure remains stable.  Need to follow-up with vascular surgery 8 to 10 weeks with repeat CT dissection protocol.  Subjective: Patient denies any complaints today, no chest pain, no back pain, no fever, no chills, as well no significant events overnight   Assessment & Plan: Active Problems:   Uncontrolled diabetes mellitus type 2 without complications (HCC)   History of CVA (cerebrovascular accident)   History of MI (myocardial infarction)   Peripheral arterial disease (HCC)   MCI (mild cognitive impairment)   Aortic dissection (HCC)   Hypertensive emergency  Hypertensive emergency with descending thoracic aortic dissection/possible mural thrombus: On repeat CT on 08/19/2018 stable.  Blood pressure fairly controlled after made adjustment.  Will resume back on home dose Imdur today -Okay to discharge from vascular surgery standpoint as long as blood pressure stays stable. -Follow-up with vascular surgery in 8 to 10 weeks with repeat CTA dissection protocol -Goal SBP less than 140. -Echocardiogram with EF of  60 to 65%, G1 DD, moderate LAE and no significant aortic regurg -Pain well controlled. -PT eval-recommended 24/7 in-home supervision or SNF. Attempted to call Niece multiple times 12/2 on 12/3 without response.  Care manager attempting as well.  UTI: Urinalysis concerning but urine culture without significant growth. -Continue oral Keflex through 12/5  Urinary retention: apparently came in with Foley from PCP office which was discontinued. Failed voiding trial multiple times here.  Had over 1200 in and out cath yesterday.  Could definitely contribute to the agitation. -Place Foley -Discussed with Urology, Dr. Marlou PorchHerrick over the phone 12/2.  Okay to discharge with Foley catheter.  He will arrange outpatient follow-up in a week.  Diabetes: CBG within normal range. -Check A1c -Sliding scale insulin -CBG monitoring  History of CAD/history of CVA/history of PAD/HLD: Stable -Continue home meds.  Small pulmonary nodule: Largest measuring 5 mm -Noncontrast CT chest in 12 months recommended.  Enlarged thyroid gland/goiter: Noted on CT -Outpatient follow-up  Delirium/with underlying cognitive impairment: Delirium resolved. -Delirium precaution -Foley for urinary retention. -PRN Seroquel if combative or severe agiatation  Scheduled Meds: . cephALEXin  500 mg Oral Q8H  . cloNIDine  0.2 mg Oral BID  . escitalopram  5 mg Oral Daily  . ezetimibe  10 mg Oral Daily  . fentaNYL (SUBLIMAZE) injection  50 mcg Intravenous Once  . hydrALAZINE  25 mg Oral BID  . insulin aspart  0-15 Units Subcutaneous TID AC & HS  . labetalol  200 mg Oral BID  . pantoprazole  40 mg Oral Daily  . simvastatin  20 mg Oral QHS   Continuous Infusions: . sodium chloride Stopped (08/18/18 0804)  . lactated ringers Stopped (08/18/18 0753)  PRN Meds:.sodium chloride, acetaminophen, albuterol, morphine injection, ondansetron (ZOFRAN) IV  DVT prophylaxis: SCD Code Status: DNR Family Communication: Discussed with niece  via phone today, Disposition Plan: For discharge home with home care tomorrow, discussed with niece today, provided she will be able to provide 24/7 home supervision care, but she request patient to be discharged tomorrow will be available today to supervise the patient .   Consultants:   Vascular surgery  Procedures:   Echocardiogram  Antimicrobials:  Ceftriaxone 11/29 to 11/30  Keflex 11/30->  Objective: Vitals:   08/21/18 2158 08/22/18 0021 08/22/18 0508 08/22/18 0847  BP: (!) 123/50 (!) 153/66  (!) 143/74  Pulse: (!) 58 (!) 57  65  Resp: 18 20    Temp: 99.3 F (37.4 C) 98.1 F (36.7 C)  97.9 F (36.6 C)  TempSrc: Oral Oral  Oral  SpO2: 100% 100%  100%  Weight:   67.8 kg     Intake/Output Summary (Last 24 hours) at 08/22/2018 1258 Last data filed at 08/21/2018 1900 Gross per 24 hour  Intake -  Output 325 ml  Net -325 ml   Filed Weights   08/20/18 0405 08/21/18 0500 08/22/18 0508  Weight: 65.5 kg 63.7 kg 67.8 kg    Examination:  Awake Alert, Oriented X 3, No new F.N deficits, Normal affect Symmetrical Chest wall movement, Good air movement bilaterally, CTAB RRR,No Gallops,Rubs or new Murmurs, No Parasternal Heave +ve B.Sounds, Abd Soft, No tenderness, No rebound - guarding or rigidity. No Cyanosis, Clubbing or edema, No new Rash or bruise      Data Reviewed: I have independently reviewed following labs and imaging studies   CBC: Recent Labs  Lab 08/16/18 2312 08/16/18 2322 08/17/18 0528 08/18/18 0345 08/19/18 0239 08/20/18 0419  WBC 5.5  --  5.9 6.4 4.4 4.5  NEUTROABS 3.8  --   --   --   --   --   HGB 14.1 14.6 14.6 13.5 13.0 14.1  HCT 44.6 43.0 44.4 42.7 40.6 43.6  MCV 98.0  --  94.3 96.6 96.7 95.0  PLT 204  --  197 196 191 203   Basic Metabolic Panel: Recent Labs  Lab 08/16/18 2312 08/16/18 2322 08/17/18 0528 08/18/18 0345 08/19/18 0239 08/20/18 0419  NA 140 141 140 139 137 139  K 4.4 4.3 3.8 4.5 5.2* 4.3  CL 106 104 108 104 104  102  CO2 27  --  25 22 24 31   GLUCOSE 128* 121* 95 78 98 114*  BUN 8 9 7* 9 13 17   CREATININE 0.92 0.90 0.74 0.84 0.80 0.89  CALCIUM 9.0  --  9.2 8.7* 8.7* 9.2  MG  --   --  1.8  --   --   --   PHOS  --   --  3.3  --   --   --    GFR: CrCl cannot be calculated (Unknown ideal weight.). Liver Function Tests: No results for input(s): AST, ALT, ALKPHOS, BILITOT, PROT, ALBUMIN in the last 168 hours. No results for input(s): LIPASE, AMYLASE in the last 168 hours. No results for input(s): AMMONIA in the last 168 hours. Coagulation Profile: No results for input(s): INR, PROTIME in the last 168 hours. Cardiac Enzymes: No results for input(s): CKTOTAL, CKMB, CKMBINDEX, TROPONINI in the last 168 hours. BNP (last 3 results) No results for input(s): PROBNP in the last 8760 hours. HbA1C: No results for input(s): HGBA1C in the last 72 hours. CBG: Recent Labs  Lab 08/21/18 1154  08/21/18 1629 08/21/18 2117 08/22/18 0818 08/22/18 1232  GLUCAP 96 79 173* 78 117*   Lipid Profile: No results for input(s): CHOL, HDL, LDLCALC, TRIG, CHOLHDL, LDLDIRECT in the last 72 hours. Thyroid Function Tests: No results for input(s): TSH, T4TOTAL, FREET4, T3FREE, THYROIDAB in the last 72 hours. Anemia Panel: No results for input(s): VITAMINB12, FOLATE, FERRITIN, TIBC, IRON, RETICCTPCT in the last 72 hours. Urine analysis:    Component Value Date/Time   COLORURINE YELLOW 08/16/2018 2336   APPEARANCEUR HAZY (A) 08/16/2018 2336   LABSPEC 1.026 08/16/2018 2336   PHURINE 5.0 08/16/2018 2336   GLUCOSEU NEGATIVE 08/16/2018 2336   HGBUR MODERATE (A) 08/16/2018 2336   BILIRUBINUR NEGATIVE 08/16/2018 2336   BILIRUBINUR neg 07/18/2014 1320   KETONESUR NEGATIVE 08/16/2018 2336   PROTEINUR NEGATIVE 08/16/2018 2336   UROBILINOGEN 0.2 07/18/2014 1320   UROBILINOGEN 0.2 06/14/2014 1236   NITRITE NEGATIVE 08/16/2018 2336   LEUKOCYTESUR LARGE (A) 08/16/2018 2336   Sepsis Labs: Invalid input(s): PROCALCITONIN,  LACTICIDVEN  Recent Results (from the past 240 hour(s))  Urine culture     Status: Abnormal   Collection Time: 08/17/18  2:03 AM  Result Value Ref Range Status   Specimen Description URINE, CATHETERIZED  Final   Special Requests NONE  Final   Culture (A)  Final    <10,000 COLONIES/mL INSIGNIFICANT GROWTH Performed at [facility], [address], [place], [place] [address]    Report Status 08/18/2018 FINAL  Final  MRSA PCR Screening     Status: None   Collection Time: 08/17/18  4:14 AM  Result Value Ref Range Status   MRSA by PCR NEGATIVE NEGATIVE Final    Comment:        The GeneXpert MRSA Assay (FDA approved for NASAL specimens only), is one component of a comprehensive MRSA colonization surveillance program. It is not intended to diagnose MRSA infection nor to guide or monitor treatment for MRSA infections. Performed at [facility], [address], [place], [place] [address]       Radiology Studies: No results found.   Huey Bienenstock MD [facility] Pager [phone]  If 7PM-7AM, please contact night-coverage [email or web address] Password [email or web address] 08/22/2018, 12:58 PM

## 2018-08-22 NOTE — Plan of Care (Signed)
  Problem: Health Behavior/Discharge Planning: Goal: Ability to manage health-related needs will improve Outcome: Progressing   

## 2018-08-23 LAB — GLUCOSE, CAPILLARY: Glucose-Capillary: 90 mg/dL (ref 70–99)

## 2018-09-03 ENCOUNTER — Encounter: Payer: Self-pay | Admitting: Podiatry

## 2018-09-03 ENCOUNTER — Ambulatory Visit (INDEPENDENT_AMBULATORY_CARE_PROVIDER_SITE_OTHER): Payer: Federal, State, Local not specified - PPO | Admitting: Podiatry

## 2018-09-03 DIAGNOSIS — M79674 Pain in right toe(s): Secondary | ICD-10-CM | POA: Diagnosis not present

## 2018-09-03 DIAGNOSIS — E1151 Type 2 diabetes mellitus with diabetic peripheral angiopathy without gangrene: Secondary | ICD-10-CM | POA: Diagnosis not present

## 2018-09-03 DIAGNOSIS — M79675 Pain in left toe(s): Secondary | ICD-10-CM

## 2018-09-03 DIAGNOSIS — B351 Tinea unguium: Secondary | ICD-10-CM

## 2018-09-03 MED ORDER — KETOCONAZOLE 2 % EX CREA: 1.0000 "application " | TOPICAL_CREAM | Freq: Every day | CUTANEOUS | 1 refills | Status: DC

## 2018-09-03 NOTE — Patient Instructions (Signed)
Diabetes and Foot Care Diabetes may cause you to have problems because of poor blood supply (circulation) to your feet and legs. This may cause the skin on your feet to become thinner, break easier, and heal more slowly. Your skin may become dry, and the skin may peel and crack. You may also have nerve damage in your legs and feet causing decreased feeling in them. You may not notice minor injuries to your feet that could lead to infections or more serious problems. Taking care of your feet is one of the most important things you can do for yourself. Follow these instructions at home:  Wear shoes at all times, even in the house. Do not go barefoot. Bare feet are easily injured.  Check your feet daily for blisters, cuts, and redness. If you cannot see the bottom of your feet, use a mirror or ask someone for help.  Wash your feet with warm water (do not use hot water) and mild soap. Then pat your feet and the areas between your toes until they are completely dry. Do not soak your feet as this can dry your skin.  Apply a moisturizing lotion or petroleum jelly (that does not contain alcohol and is unscented) to the skin on your feet and to dry, brittle toenails. Do not apply lotion between your toes.  Trim your toenails straight across. Do not dig under them or around the cuticle. File the edges of your nails with an emery board or nail file.  Do not cut corns or calluses or try to remove them with medicine.  Wear clean socks or stockings every day. Make sure they are not too tight. Do not wear knee-high stockings since they may decrease blood flow to your legs.  Wear shoes that fit properly and have enough cushioning. To break in new shoes, wear them for just a few hours a day. This prevents you from injuring your feet. Always look in your shoes before you put them on to be sure there are no objects inside.  Do not cross your legs. This may decrease the blood flow to your feet.  If you find a  minor scrape, cut, or break in the skin on your feet, keep it and the skin around it clean and dry. These areas may be cleansed with mild soap and water. Do not cleanse the area with peroxide, alcohol, or iodine.  When you remove an adhesive bandage, be sure not to damage the skin around it.  If you have a wound, look at it several times a day to make sure it is healing.  Do not use heating pads or hot water bottles. They may burn your skin. If you have lost feeling in your feet or legs, you may not know it is happening until it is too late.  Make sure your health care provider performs a complete foot exam at least annually or more often if you have foot problems. Report any cuts, sores, or bruises to your health care provider immediately. Contact a health care provider if:  You have an injury that is not healing.  You have cuts or breaks in the skin.  You have an ingrown nail.  You notice redness on your legs or feet.  You feel burning or tingling in your legs or feet.  You have pain or cramps in your legs and feet.  Your legs or feet are numb.  Your feet always feel cold. Get help right away if:  There is increasing   redness, swelling, or pain in or around a wound.  There is a red line that goes up your leg.  Pus is coming from a wound.  You develop a fever or as directed by your health care provider.  You notice a bad smell coming from an ulcer or wound. This information is not intended to replace advice given to you by your health care provider. Make sure you discuss any questions you have with your health care provider. Document Released: 09/02/2000 Document Revised: 02/11/2016 Document Reviewed: 02/12/2013 Elsevier Interactive Patient Education  2017 [employer] Onychomycosis/Fungal Toenails  WHAT IS IT? An infection that lies within the keratin of your nail plate that is caused by a fungus.  WHY ME? Fungal infections affect all ages, sexes, races, and creeds.   There may be many factors that predispose you to a fungal infection such as age, coexisting medical conditions such as diabetes, or an autoimmune disease; stress, medications, fatigue, genetics, etc.  Bottom line: fungus thrives in a warm, moist environment and your shoes offer such a location.  IS IT CONTAGIOUS? Theoretically, yes.  You do not want to share shoes, nail clippers or files with someone who has fungal toenails.  Walking around barefoot in the same room or sleeping in the same bed is unlikely to transfer the organism.  It is important to realize, however, that fungus can spread easily from one nail to the next on the same foot.  HOW DO WE TREAT THIS?  There are several ways to treat this condition.  Treatment may depend on many factors such as age, medications, pregnancy, liver and kidney conditions, etc.  It is best to ask your doctor which options are available to you.  1. No treatment.   Unlike many other medical concerns, you can live with this condition.  However for many people this can be a painful condition and may lead to ingrown toenails or a bacterial infection.  It is recommended that you keep the nails cut short to help reduce the amount of fungal nail. 2. Topical treatment.  These range from herbal remedies to prescription strength nail lacquers.  About 40-50% effective, topicals require twice daily application for approximately 9 to 12 months or until an entirely new nail has grown out.  The most effective topicals are medical grade medications available through physicians offices. 3. Oral antifungal medications.  With an 80-90% cure rate, the most common oral medication requires 3 to 4 months of therapy and stays in your system for a year as the new nail grows out.  Oral antifungal medications do require blood work to make sure it is a safe drug for you.  A liver function panel will be performed prior to starting the medication and after the first month of treatment.  It is  important to have the blood work performed to avoid any harmful side effects.  In general, this medication safe but blood work is required. 4. Laser Therapy.  This treatment is performed by applying a specialized laser to the affected nail plate.  This therapy is noninvasive, fast, and non-painful.  It is not covered by insurance and is therefore, out of pocket.  The results have been very good with a 80-95% cure rate.  [facility] is the only practice in the area to offer this therapy. 5. Permanent Nail Avulsion.  Removing the entire nail so that a new nail will not grow back. 

## 2018-09-04 NOTE — Progress Notes (Signed)
Subjective: Kaitlin Smith presents today with painful, thick toenails 1-5 b/l that she cannot cut and which interfere with daily activities.  Pain is aggravated when wearing enclosed shoe gear.  Patient's niece is present during the visit.   Per niece, 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 It was found to be a small tear and no surgical intervention is planned due to patient's age.  Niece thinks her feet are still scaly.  Kaitlin Smith, Scott, MD is her PCP.   Current Outpatient Medications:  .  acetaminophen (TYLENOL) 325 MG tablet, Take 2 tablets (650 mg total) by mouth every 6 (six) hours as needed for mild pain (temp > 101.5)., Disp: , Rfl:  .  amLODipine (NORVASC) 10 MG tablet, TAKE 1 TABLET (10 MG TOTAL) BY MOUTH DAILY., Disp: 30 tablet, Rfl: 11 .  calcium carbonate (OSCAL) 1500 (600 Ca) MG TABS tablet, Take 1,500 mg by mouth daily with breakfast., Disp: , Rfl:  .  cloNIDine (CATAPRES) 0.2 MG tablet, Take 1 tablet (0.2 mg total) by mouth 2 (two) times daily., Disp: 60 tablet, Rfl: 6 .  clopidogrel (PLAVIX) 75 MG tablet, TAKE 1 TABLET (75 MG TOTAL) BY MOUTH DAILY., Disp: 30 tablet, Rfl: 11 .  escitalopram (LEXAPRO) 5 MG tablet, Take 5 mg by mouth daily., Disp: , Rfl:  .  ezetimibe (ZETIA) 10 MG tablet, Take 1 tablet (10 mg total) by mouth daily., Disp: 30 tablet, Rfl: 10 .  FREESTYLE LITE test strip, CHECK 3 TIMES PER DAY, Disp: 100 each, Rfl: 3 .  hydrALAZINE (APRESOLINE) 25 MG tablet, Take 1 tablet (25 mg total) by mouth 2 (two) times daily., Disp: 60 tablet, Rfl: 0 .  insulin glargine (LANTUS) 100 UNIT/ML injection, Inject 0-0.1 mLs (0-10 Units total) into the skin at bedtime. Sliding scale (Patient taking differently: Inject 0-10 Units into the skin See admin instructions. Per Sliding scale), Disp: 10 mL, Rfl: 3 .  insulin lispro (HUMALOG) 100 UNIT/ML injection, INJECT 0.1 MILLILITERS (10 UNITS) INTO THE SKIN 3 TIMES A DAY WITH MEALS (Patient taking  differently: Inject 0-10 Units into the skin See admin instructions. Per Sliding Scale 3 TIMES A DAY WITH MEALS), Disp: 10 mL, Rfl: 0 .  Insulin Syringe-Needle U-100 (BD INSULIN SYRINGE ULTRAFINE) 31G X 15/64" 0.5 ML MISC, Take insulin once nightly as needed, Disp: 90 each, Rfl: 3 .  isosorbide mononitrate (IMDUR) 30 MG 24 hr tablet, Take 0.5 tablets (15 mg total) by mouth daily., Disp: 30 tablet, Rfl: 3 .  labetalol (NORMODYNE) 200 MG tablet, Take 200 mg by mouth 2 (two) times daily., Disp: , Rfl:  .  Lancets (FREESTYLE) lancets, Check 3 times per day for E11.9, Disp: 100 each, Rfl: 12 .  loratadine (CLARITIN) 10 MG tablet, Take 10 mg by mouth daily., Disp: , Rfl:  .  pantoprazole (PROTONIX) 40 MG tablet, TAKE 1 TABLET BY MOUTH EVERY DAY, Disp: 90 tablet, Rfl: 2 .  Polyethylene Glycol 3350-GRX POWD, polyethylene glycol 3350 17 gram/dose oral powder  USE AS DIRECTED, Disp: , Rfl:  .  polyethylene glycol powder (GLYCOLAX/MIRALAX) powder, Take 17 g by mouth daily., Disp: 850 g, Rfl: 4 .  simvastatin (ZOCOR) 20 MG tablet, TAKE 1 TABLET BY MOUTH AT BEDTIME, Disp: 30 tablet, Rfl: 11 .  tamsulosin (FLOMAX) 0.4 MG CAPS capsule, tamsulosin 0.4 mg capsule, Disp: , Rfl:  .  Vitamin D, Ergocalciferol, (DRISDOL) 50000 units CAPS capsule, Take 50,000 Units by mouth every Wednesday., Disp: , Rfl:  .  VOLTAREN 1 % GEL, APPLY 4 GRAMS TOPICALLY 2 TIMES DAILY AS NEEDED., Disp: 100 g, Rfl: 0 .  ketoconazole (NIZORAL) 2 % cream, Apply 1 application topically daily. Apply to both feet and between toes once daily for 6 weeks, Disp: 30 g, Rfl: 1  Allergies  Allergen Reactions  . Milk-Related Compounds Other (See Comments)    Lactose intolerant.      Objective:  Vascular Examination: Capillary refill time delayed x 10 Dorsalis pedis and Posterior tibial pulses nonpalpable b/l Digital hair present x 10 digits Skin temperature gradient WNL b/l  Dermatological Examination: Atrophic skin with absent hair growth  b/l  Toenails 1-5 b/l discolored, thick, dystrophic with subungual debris and pain with palpation to nailbeds due to thickness of nails.  Dfiffuse scaling persists plantarly and peripherally b/l  Musculoskeletal: Muscle strength 5/5 to all LE muscle groups  Neurological: Sensation intact with 10 gram monofilament. Vibratory sensation intact.  Assessment: 1. Painful onychomycosis toenails 1-5 b/l  2. NIDDM with PAD  Plan: 1. Toenails 1-5 b/l were debrided in length and girth without iatrogenic bleeding. 2. Refill Ketoconazole Cream: apply to both feet and between toes once daily for 6 weeks. 3. Patient to continue soft, supportive shoe gear 4. Patient to report any pedal injuries to medical professional immediately. 5. Follow up 3 months. Patient/POA to call should there be a concern in the interim.

## 2018-09-05 ENCOUNTER — Other Ambulatory Visit: Payer: Self-pay

## 2018-09-05 DIAGNOSIS — I7101 Dissection of thoracic aorta: Secondary | ICD-10-CM

## 2018-09-05 DIAGNOSIS — I71019 Dissection of thoracic aorta, unspecified: Secondary | ICD-10-CM

## 2018-11-09 ENCOUNTER — Other Ambulatory Visit: Payer: Federal, State, Local not specified - PPO

## 2018-11-13 ENCOUNTER — Inpatient Hospital Stay: Admission: RE | Admit: 2018-11-13 | Payer: Federal, State, Local not specified - PPO | Source: Ambulatory Visit

## 2018-11-13 ENCOUNTER — Other Ambulatory Visit: Payer: Federal, State, Local not specified - PPO

## 2018-11-14 ENCOUNTER — Ambulatory Visit
Admission: RE | Admit: 2018-11-14 | Discharge: 2018-11-14 | Disposition: A | Discharge status: Home / Self Care | Payer: Federal, State, Local not specified - PPO | Source: Ambulatory Visit | Attending: Vascular Surgery | Admitting: Vascular Surgery

## 2018-11-14 DIAGNOSIS — I7101 Dissection of thoracic aorta: Secondary | ICD-10-CM

## 2018-11-14 DIAGNOSIS — I71019 Dissection of thoracic aorta, unspecified: Secondary | ICD-10-CM

## 2018-11-14 MED ORDER — IOPAMIDOL (ISOVUE-370) INJECTION 76%
75.0000 mL | Freq: Once | INTRAVENOUS | Status: AC | PRN
  Administered 2018-11-14: 75 mL via INTRAVENOUS

## 2018-11-16 ENCOUNTER — Ambulatory Visit: Payer: Federal, State, Local not specified - PPO | Admitting: Vascular Surgery

## 2018-11-30 ENCOUNTER — Encounter: Payer: Self-pay | Admitting: Vascular Surgery

## 2018-11-30 ENCOUNTER — Other Ambulatory Visit: Payer: Self-pay

## 2018-11-30 ENCOUNTER — Ambulatory Visit (INDEPENDENT_AMBULATORY_CARE_PROVIDER_SITE_OTHER): Payer: Federal, State, Local not specified - PPO | Admitting: Vascular Surgery

## 2018-11-30 VITALS — BP 166/80 | HR 72 | Temp 98.6°F | Resp 16 | Ht 64.0 in | Wt 143.0 lb

## 2018-11-30 DIAGNOSIS — I7101 Dissection of thoracic aorta: Secondary | ICD-10-CM | POA: Diagnosis not present

## 2018-11-30 DIAGNOSIS — I71019 Dissection of thoracic aorta, unspecified: Secondary | ICD-10-CM

## 2018-11-30 NOTE — Progress Notes (Signed)
Patient ID: Kaitlin Smith, female   DOB: 17-May-1930, 85 y.o.   MRN: [id number]  Reason for Consult: No chief complaint on file.   Referred by Alysia Penna, MD  Subjective:     HPI:  Kaitlin Smith is a 85 y.o. female follows up for distal thoracic aortic dissection flap.  She has no further back or abdominal pain or chest pain.  She has done quite well since discharge.  According to family patient would not want surgery and this is consistent with her wishes when she was in the hospital as she is a DNR.  Past Medical History:  Diagnosis Date  . Allergy   . Arthritis   . Blindness   . Coronary artery disease   . Diabetes mellitus without complication (HCC)   . Glaucoma   . Hyperlipidemia   . Hypertension   . Myocardial infarction (HCC)   . Peripheral vascular disease (HCC)   . Stroke [facility])    Family History  Problem Relation Age of Onset  . Diabetes Mother   . Stroke Mother   . Peripheral vascular disease Mother        amputation  . Cancer Brother   . Diabetes Brother   . Alcohol abuse Brother   . Varicose Veins Brother   . Hypertension Father   . Varicose Veins Father   . Heart attack Father   . Cancer Father   . Diabetes Sister   . Hypertension Sister    Past Surgical History:  Procedure Laterality Date  . EYE SURGERY      Short Social History:  Social History   Tobacco Use  . Smoking status: Former Smoker    Types: Cigarettes    Last attempt to quit: 06/06/2002    Years since quitting: 16.4  . Smokeless tobacco: Never Used  . Tobacco comment: Quit at age 27  Substance Use Topics  . Alcohol use: No    Alcohol/week: 0.0 standard drinks    Allergies  Allergen Reactions  . Milk-Related Compounds Other (See Comments)    Lactose intolerant.      Current Outpatient Medications  Medication Sig Dispense Refill  . acetaminophen (TYLENOL) 325 MG tablet Take 2 tablets (650 mg total) by mouth every 6 (six) hours as needed for mild pain (temp > 101.5).     Marland Kitchen amLODipine (NORVASC) 10 MG tablet TAKE 1 TABLET (10 MG TOTAL) BY MOUTH DAILY. 30 tablet 11  . calcium carbonate (OSCAL) 1500 (600 Ca) MG TABS tablet Take 1,500 mg by mouth daily with breakfast.    . cloNIDine (CATAPRES) 0.2 MG tablet Take 1 tablet (0.2 mg total) by mouth 2 (two) times daily. 60 tablet 6  . clopidogrel (PLAVIX) 75 MG tablet TAKE 1 TABLET (75 MG TOTAL) BY MOUTH DAILY. 30 tablet 11  . escitalopram (LEXAPRO) 5 MG tablet Take 5 mg by mouth daily.    Marland Kitchen ezetimibe (ZETIA) 10 MG tablet Take 1 tablet (10 mg total) by mouth daily. 30 tablet 10  . FREESTYLE LITE test strip CHECK 3 TIMES PER DAY 100 each 3  . hydrALAZINE (APRESOLINE) 25 MG tablet Take 1 tablet (25 mg total) by mouth 2 (two) times daily. 60 tablet 0  . insulin glargine (LANTUS) 100 UNIT/ML injection Inject 0-0.1 mLs (0-10 Units total) into the skin at bedtime. Sliding scale (Patient taking differently: Inject 0-10 Units into the skin See admin instructions. Per Sliding scale) 10 mL 3  . insulin lispro (HUMALOG) 100 UNIT/ML injection INJECT 0.1 MILLILITERS (  10 UNITS) INTO THE SKIN 3 TIMES A DAY WITH MEALS (Patient taking differently: Inject 0-10 Units into the skin See admin instructions. Per Sliding Scale 3 TIMES A DAY WITH MEALS) 10 mL 0  . Insulin Syringe-Needle U-100 (BD INSULIN SYRINGE ULTRAFINE) 31G X 15/64" 0.5 ML MISC Take insulin once nightly as needed 90 each 3  . isosorbide mononitrate (IMDUR) 30 MG 24 hr tablet Take 0.5 tablets (15 mg total) by mouth daily. 30 tablet 3  . ketoconazole (NIZORAL) 2 % cream Apply 1 application topically daily. Apply to both feet and between toes once daily for 6 weeks 30 g 1  . labetalol (NORMODYNE) 200 MG tablet Take 200 mg by mouth 2 (two) times daily.    . Lancets (FREESTYLE) lancets Check 3 times per day for E11.9 100 each 12  . loratadine (CLARITIN) 10 MG tablet Take 10 mg by mouth daily.    . pantoprazole (PROTONIX) 40 MG tablet TAKE 1 TABLET BY MOUTH EVERY DAY 90 tablet 2  .  Polyethylene Glycol 3350-GRX POWD polyethylene glycol 3350 17 gram/dose oral powder  USE AS DIRECTED    . polyethylene glycol powder (GLYCOLAX/MIRALAX) powder Take 17 g by mouth daily. 850 g 4  . simvastatin (ZOCOR) 20 MG tablet TAKE 1 TABLET BY MOUTH AT BEDTIME 30 tablet 11  . tamsulosin (FLOMAX) 0.4 MG CAPS capsule tamsulosin 0.4 mg capsule    . Vitamin D, Ergocalciferol, (DRISDOL) 50000 units CAPS capsule Take 50,000 Units by mouth every Wednesday.    . VOLTAREN 1 % GEL APPLY 4 GRAMS TOPICALLY 2 TIMES DAILY AS NEEDED. 100 g 0   No current facility-administered medications for this visit.     Review of Systems  Constitutional:  Constitutional negative. HENT: HENT negative.  Eyes: Eyes negative.  Respiratory: Respiratory negative.  Cardiovascular: Cardiovascular negative.  GI: Gastrointestinal negative.  Musculoskeletal: Musculoskeletal negative.  Skin: Skin negative.  Neurological: Neurological negative. Hematologic: Hematologic/lymphatic negative.  Psychiatric: Psychiatric negative.        Objective:  Objective   There were no vitals filed for this visit. There is no height or weight on file to calculate BMI.  Physical Exam Eyes:     Pupils: Pupils are equal, round, and reactive to light.  Neck:     Musculoskeletal: Normal range of motion and neck supple.  Cardiovascular:     Rate and Rhythm: Normal rate.  Pulmonary:     Effort: Pulmonary effort is normal.  Abdominal:     General: Abdomen is flat.     Palpations: Abdomen is soft.  Musculoskeletal: Normal range of motion.        General: No swelling.  Skin:    General: Skin is warm.     Capillary Refill: Capillary refill takes less than 2 seconds.  Neurological:     General: No focal deficit present.     Mental Status: She is alert.  Psychiatric:        Mood and Affect: Mood normal.        Behavior: Behavior normal.        Thought Content: Thought content normal.        Judgment: Judgment normal.      Data:  IMPRESSION: Chest CTA impression:  1. Unchanged appearance of wide neck penetrating atherosclerotic ulcer involving the descending thoracic aorta measuring approximately 2 cm in maximal diameter. No associated mural thrombus or periaortic stranding. 2. Large amount of mixed calcified and noncalcified atherosclerotic plaque throughout the abdominal aorta, not resulting in hemodynamically  significant stenosis. Aortic Atherosclerosis (ICD10-I70.0). 3. Cardiomegaly.  Coronary artery calcifications. 4. Punctate (5 mm) left lower lobe pulmonary nodules unchanged compared to the 07/2018 examination. No follow-up needed if patient is low-risk. Non-contrast chest CT can be considered in 12 months if patient is high-risk. This recommendation follows the consensus statement: Guidelines for Management of Incidental Pulmonary Nodules Detected on CT Images: From the Fleischner Society 2017; Radiology 2017; 284:228-243.  Abdomen and pelvic CTA impression:  1. Large amount of atherosclerotic plaque throughout the normal caliber abdominal aorta, not resulting in hemodynamically significant stenosis. Aortic Atherosclerosis (ICD10-I70.0). 2. Suspected hemodynamically significant narrowings involving the right common femoral and imaged portions of the proximal bilateral superficial femoral arteries. Correlation for symptoms of PAD is advised. 3. Cholelithiasis without evidence of cholecystitis. 4. Colonic diverticulosis without evidence of superimposed acute diverticulitis. 5. Unchanged macroscopic fat containing approximately 7.2 cm presacral lipoma, grossly unchanged compared to the 05/2014 examination.       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  Given that she has no symptoms and patient would not want any surgery I think she can follow-up on an as-needed basis.   I did give them the option to see me in 6 months or 1 year with repeat CT.  Certainly if she has any further issues and would desire surgery I will be available for her.     Maeola Harman MD [facility]

## 2018-12-01 ENCOUNTER — Other Ambulatory Visit: Payer: Self-pay | Admitting: Cardiovascular Disease

## 2018-12-01 DIAGNOSIS — Z8673 Personal history of transient ischemic attack (TIA), and cerebral infarction without residual deficits: Secondary | ICD-10-CM

## 2018-12-04 ENCOUNTER — Ambulatory Visit: Payer: Federal, State, Local not specified - PPO | Admitting: Podiatry

## 2018-12-07 ENCOUNTER — Telehealth: Payer: Self-pay

## 2018-12-07 NOTE — Telephone Encounter (Signed)
Pt's niece Stanton Kidney called stating pt is having 4/10 pain in her left shoulder to elbow area that began yesterday. Pain is relieved with Tylenol. Stanton Kidney checked her b/p it is 149/78. She is going to give her Hydralazine. I made Dr. Randie Heinz aware and he recommended that she continue Tylenol if that alleviates pain but to go to ED if symptoms persist/worsen. [name] verbalized understanding; no further questions at this time.

## 2018-12-11 ENCOUNTER — Ambulatory Visit: Payer: Federal, State, Local not specified - PPO | Admitting: Podiatry

## 2019-01-09 ENCOUNTER — Telehealth: Payer: Self-pay | Admitting: Cardiovascular Disease

## 2019-01-09 ENCOUNTER — Other Ambulatory Visit: Payer: Self-pay | Admitting: Cardiovascular Disease

## 2019-01-09 NOTE — Telephone Encounter (Signed)
Pantoprazole refilled. 

## 2019-01-09 NOTE — Telephone Encounter (Signed)
LVM to schedule 1 year followup. °

## 2019-01-11 ENCOUNTER — Ambulatory Visit: Payer: Federal, State, Local not specified - PPO | Admitting: Podiatry

## 2019-01-16 ENCOUNTER — Telehealth: Payer: Self-pay | Admitting: *Deleted

## 2019-01-16 NOTE — Telephone Encounter (Signed)
01/16/19 Unable to leave a message mailbox is full.

## 2019-02-26 ENCOUNTER — Telehealth: Payer: Self-pay | Admitting: *Deleted

## 2019-02-26 NOTE — Telephone Encounter (Signed)
A message was left, re: follow up visit. 

## 2019-03-09 ENCOUNTER — Other Ambulatory Visit: Payer: Self-pay | Admitting: Cardiovascular Disease

## 2019-03-09 DIAGNOSIS — Z8673 Personal history of transient ischemic attack (TIA), and cerebral infarction without residual deficits: Secondary | ICD-10-CM

## 2019-03-21 ENCOUNTER — Other Ambulatory Visit: Payer: Self-pay | Admitting: Cardiovascular Disease

## 2019-03-21 DIAGNOSIS — Z8673 Personal history of transient ischemic attack (TIA), and cerebral infarction without residual deficits: Secondary | ICD-10-CM

## 2019-04-05 ENCOUNTER — Ambulatory Visit: Payer: Federal, State, Local not specified - PPO | Admitting: Podiatry

## 2019-04-05 ENCOUNTER — Other Ambulatory Visit: Payer: Self-pay

## 2019-04-05 ENCOUNTER — Encounter: Payer: Self-pay | Admitting: Podiatry

## 2019-04-05 DIAGNOSIS — E1151 Type 2 diabetes mellitus with diabetic peripheral angiopathy without gangrene: Secondary | ICD-10-CM

## 2019-04-05 DIAGNOSIS — B351 Tinea unguium: Secondary | ICD-10-CM | POA: Diagnosis not present

## 2019-04-05 DIAGNOSIS — M79674 Pain in right toe(s): Secondary | ICD-10-CM

## 2019-04-05 DIAGNOSIS — M79675 Pain in left toe(s): Secondary | ICD-10-CM | POA: Diagnosis not present

## 2019-04-05 NOTE — Patient Instructions (Signed)
Diabetes Mellitus and Foot Care Foot care is an important part of your health, especially when you have diabetes. Diabetes may cause you to have problems because of poor blood flow (circulation) to your feet and legs, which can cause your skin to:  Become thinner and drier.  Break more easily.  Heal more slowly.  Peel and crack. You may also have nerve damage (neuropathy) in your legs and feet, causing decreased feeling in them. This means that you may not notice minor injuries to your feet that could lead to more serious problems. Noticing and addressing any potential problems early is the best way to prevent future foot problems. How to care for your feet Foot hygiene  Wash your feet daily with warm water and mild soap. Do not use hot water. Then, pat your feet and the areas between your toes until they are completely dry. Do not soak your feet as this can dry your skin.  Trim your toenails straight across. Do not dig under them or around the cuticle. File the edges of your nails with an emery board or nail file.  Apply a moisturizing lotion or petroleum jelly to the skin on your feet and to dry, brittle toenails. Use lotion that does not contain alcohol and is unscented. Do not apply lotion between your toes. Shoes and socks  Wear clean socks or stockings every day. Make sure they are not too tight. Do not wear knee-high stockings since they may decrease blood flow to your legs.  Wear shoes that fit properly and have enough cushioning. Always look in your shoes before you put them on to be sure there are no objects inside.  To break in new shoes, wear them for just a few hours a day. This prevents injuries on your feet. Wounds, scrapes, corns, and calluses  Check your feet daily for blisters, cuts, bruises, sores, and redness. If you cannot see the bottom of your feet, use a mirror or ask someone for help.  Do not cut corns or calluses or try to remove them with medicine.  If you  find a minor scrape, cut, or break in the skin on your feet, keep it and the skin around it clean and dry. You may clean these areas with mild soap and water. Do not clean the area with peroxide, alcohol, or iodine.  If you have a wound, scrape, corn, or callus on your foot, look at it several times a day to make sure it is healing and not infected. Check for: ? Redness, swelling, or pain. ? Fluid or blood. ? Warmth. ? Pus or a bad smell. General instructions  Do not cross your legs. This may decrease blood flow to your feet.  Do not use heating pads or hot water bottles on your feet. They may burn your skin. If you have lost feeling in your feet or legs, you may not know this is happening until it is too late.  Protect your feet from hot and cold by wearing shoes, such as at the beach or on hot pavement.  Schedule a complete foot exam at least once a year (annually) or more often if you have foot problems. If you have foot problems, report any cuts, sores, or bruises to your health care provider immediately. Contact a health care provider if:  You have a medical condition that increases your risk of infection and you have any cuts, sores, or bruises on your feet.  You have an injury that is not   healing.  You have redness on your legs or feet.  You feel burning or tingling in your legs or feet.  You have pain or cramps in your legs and feet.  Your legs or feet are numb.  Your feet always feel cold.  You have pain around a toenail. Get help right away if:  You have a wound, scrape, corn, or callus on your foot and: ? You have pain, swelling, or redness that gets worse. ? You have fluid or blood coming from the wound, scrape, corn, or callus. ? Your wound, scrape, corn, or callus feels warm to the touch. ? You have pus or a bad smell coming from the wound, scrape, corn, or callus. ? You have a fever. ? You have a red line going up your leg. Summary  Check your feet every day  for cuts, sores, red spots, swelling, and blisters.  Moisturize feet and legs daily.  Wear shoes that fit properly and have enough cushioning.  If you have foot problems, report any cuts, sores, or bruises to your health care provider immediately.  Schedule a complete foot exam at least once a year (annually) or more often if you have foot problems. This information is not intended to replace advice given to you by your health care provider. Make sure you discuss any questions you have with your health care provider. Document Released: 09/02/2000 Document Revised: 10/18/2017 Document Reviewed: 10/07/2016 Elsevier Patient Education  2020 [employer]   Onychomycosis/Fungal Toenails  WHAT IS IT? An infection that lies within the keratin of your nail plate that is caused by a fungus.  WHY ME? Fungal infections affect all ages, sexes, races, and creeds.  There may be many factors that predispose you to a fungal infection such as age, coexisting medical conditions such as diabetes, or an autoimmune disease; stress, medications, fatigue, genetics, etc.  Bottom line: fungus thrives in a warm, moist environment and your shoes offer such a location.  IS IT CONTAGIOUS? Theoretically, yes.  You do not want to share shoes, nail clippers or files with someone who has fungal toenails.  Walking around barefoot in the same room or sleeping in the same bed is unlikely to transfer the organism.  It is important to realize, however, that fungus can spread easily from one nail to the next on the same foot.  HOW DO WE TREAT THIS?  There are several ways to treat this condition.  Treatment may depend on many factors such as age, medications, pregnancy, liver and kidney conditions, etc.  It is best to ask your doctor which options are available to you.  1. No treatment.   Unlike many other medical concerns, you can live with this condition.  However for many people this can be a painful condition and may lead to  ingrown toenails or a bacterial infection.  It is recommended that you keep the nails cut short to help reduce the amount of fungal nail. 2. Topical treatment.  These range from herbal remedies to prescription strength nail lacquers.  About 40-50% effective, topicals require twice daily application for approximately 9 to 12 months or until an entirely new nail has grown out.  The most effective topicals are medical grade medications available through physicians offices. 3. Oral antifungal medications.  With an 80-90% cure rate, the most common oral medication requires 3 to 4 months of therapy and stays in your system for a year as the new nail grows out.  Oral antifungal medications do require   blood work to make sure it is a safe drug for you.  A liver function panel will be performed prior to starting the medication and after the first month of treatment.  It is important to have the blood work performed to avoid any harmful side effects.  In general, this medication safe but blood work is required. 4. Laser Therapy.  This treatment is performed by applying a specialized laser to the affected nail plate.  This therapy is noninvasive, fast, and non-painful.  It is not covered by insurance and is therefore, out of pocket.  The results have been very good with a 80-95% cure rate.  The [facility] is the only practice in the area to offer this therapy. 5. Permanent Nail Avulsion.  Removing the entire nail so that a new nail will not grow back. 

## 2019-04-07 ENCOUNTER — Other Ambulatory Visit: Payer: Self-pay | Admitting: Cardiovascular Disease

## 2019-04-10 NOTE — Progress Notes (Signed)
Subjective:  Kaitlin Smith presents to clinic today with cc of  painful, thick, discolored, elongated toenails 1-5 b/l that become tender and cannot cut because of thickness. Pain is aggravated when wearing enclosed shoe gear.  Velna Hatchet, MD is her PCP.   Current Outpatient Medications:  .  acetaminophen (TYLENOL) 325 MG tablet, Take 2 tablets (650 mg total) by mouth every 6 (six) hours as needed for mild pain (temp > 101.5)., Disp: , Rfl:  .  amLODipine (NORVASC) 10 MG tablet, TAKE 1 TABLET (10 MG TOTAL) BY MOUTH DAILY., Disp: 30 tablet, Rfl: 11 .  calcium carbonate (OSCAL) 1500 (600 Ca) MG TABS tablet, Take 1,500 mg by mouth daily with breakfast., Disp: , Rfl:  .  cloNIDine (CATAPRES) 0.2 MG tablet, Take 1 tablet (0.2 mg total) by mouth 2 (two) times daily., Disp: 60 tablet, Rfl: 6 .  clopidogrel (PLAVIX) 75 MG tablet, TAKE 1 TABLET (75 MG TOTAL) BY MOUTH DAILY., Disp: 90 tablet, Rfl: 3 .  ezetimibe (ZETIA) 10 MG tablet, Take 1 tablet (10 mg total) by mouth daily. OFFICE VISIT NEEDED, Disp: 30 tablet, Rfl: 2 .  FREESTYLE LITE test strip, CHECK 3 TIMES PER DAY, Disp: 100 each, Rfl: 3 .  hydrALAZINE (APRESOLINE) 25 MG tablet, Take 1 tablet (25 mg total) by mouth 2 (two) times daily., Disp: 60 tablet, Rfl: 0 .  insulin glargine (LANTUS) 100 UNIT/ML injection, Inject 0-0.1 mLs (0-10 Units total) into the skin at bedtime. Sliding scale (Patient taking differently: Inject 0-10 Units into the skin See admin instructions. Per Sliding scale), Disp: 10 mL, Rfl: 3 .  insulin lispro (HUMALOG) 100 UNIT/ML injection, INJECT 0.1 MILLILITERS (10 UNITS) INTO THE SKIN 3 TIMES A DAY WITH MEALS (Patient taking differently: Inject 0-10 Units into the skin See admin instructions. Per Sliding Scale 3 TIMES A DAY WITH MEALS), Disp: 10 mL, Rfl: 0 .  Insulin Syringe-Needle U-100 (BD INSULIN SYRINGE ULTRAFINE) 31G X 15/64" 0.5 ML MISC, Take insulin once nightly as needed, Disp: 90 each, Rfl: 3 .  isosorbide  mononitrate (IMDUR) 30 MG 24 hr tablet, Take 0.5 tablets (15 mg total) by mouth daily., Disp: 30 tablet, Rfl: 3 .  ketoconazole (NIZORAL) 2 % cream, Apply 1 application topically daily. Apply to both feet and between toes once daily for 6 weeks, Disp: 30 g, Rfl: 1 .  Lancets (FREESTYLE) lancets, Check 3 times per day for E11.9, Disp: 100 each, Rfl: 12 .  loratadine (CLARITIN) 10 MG tablet, Take 10 mg by mouth daily., Disp: , Rfl:  .  pantoprazole (PROTONIX) 40 MG tablet, TAKE 1 TABLET BY MOUTH EVERY DAY, Disp: 30 tablet, Rfl: 0 .  polyethylene glycol powder (GLYCOLAX/MIRALAX) powder, Take 17 g by mouth daily., Disp: 850 g, Rfl: 4 .  simvastatin (ZOCOR) 20 MG tablet, Take 1 tablet (20 mg total) by mouth at bedtime. OFFICE VISIT NEEDED, Disp: 40 tablet, Rfl: 0 .  tamsulosin (FLOMAX) 0.4 MG CAPS capsule, tamsulosin 0.4 mg capsule, Disp: , Rfl:  .  VOLTAREN 1 % GEL, APPLY 4 GRAMS TOPICALLY 2 TIMES DAILY AS NEEDED., Disp: 100 g, Rfl: 0   Allergies  Allergen Reactions  . Milk-Related Compounds Other (See Comments)    Lactose intolerant.       Objective:  Physical Examination:  Vascular Examination: Capillary refill time delayed x 10 digits.  Nonpalpable DP/PT pulses b/l.  Digital hair present b/l.  No edema noted b/l.  Skin temperature gradient WNL b/l.  Dermatological Examination: Pedal skin atrophic b/l.  No open wounds b/l.  No interdigital macerations noted b/l.  Elongated, thick, discolored brittle toenails with subungual debris and pain on dorsal palpation of nailbeds 1-5 b/l.  Musculoskeletal Examination: Muscle strength 5/5 to all muscle groups b/l.  No pain, crepitus or joint discomfort with active/passive ROM.  Neurological Examination: Sensation intact 5/5 b/l with 10 gram monofilament.  Vibratory sensation intact b/l.  Assessment: Mycotic nail infection with pain 1-5 b/l NIDDM with PAD  Plan: 1. Toenails 1-5 b/l were debrided in length and girth without  iatrogenic laceration. 2.  Continue soft, supportive shoe gear daily. 3.  Report any pedal injuries to medical professional. 4.  Follow up 3 months. 5.  Patient/POA to call should there be a question/concern in there interim.

## 2019-05-04 ENCOUNTER — Other Ambulatory Visit: Payer: Self-pay | Admitting: Cardiovascular Disease

## 2019-05-04 DIAGNOSIS — Z8673 Personal history of transient ischemic attack (TIA), and cerebral infarction without residual deficits: Secondary | ICD-10-CM

## 2019-05-05 ENCOUNTER — Other Ambulatory Visit: Payer: Self-pay | Admitting: Cardiovascular Disease

## 2019-06-10 LAB — IFOBT (OCCULT BLOOD): IFOBT: NEGATIVE

## 2019-06-28 ENCOUNTER — Other Ambulatory Visit: Payer: Self-pay

## 2019-06-28 ENCOUNTER — Encounter: Payer: Self-pay | Admitting: Cardiovascular Disease

## 2019-06-28 ENCOUNTER — Ambulatory Visit: Payer: Federal, State, Local not specified - PPO | Admitting: Cardiovascular Disease

## 2019-06-28 VITALS — BP 110/62 | HR 77 | Temp 97.2°F | Ht 65.0 in | Wt 138.0 lb

## 2019-06-28 DIAGNOSIS — I739 Peripheral vascular disease, unspecified: Secondary | ICD-10-CM | POA: Diagnosis not present

## 2019-06-28 DIAGNOSIS — E782 Mixed hyperlipidemia: Secondary | ICD-10-CM

## 2019-06-28 DIAGNOSIS — I10 Essential (primary) hypertension: Secondary | ICD-10-CM

## 2019-06-28 NOTE — Progress Notes (Signed)
06/28/2019 Kaitlin Smith   1930/05/07  [id number]  Primary Physician Alysia Penna, MD Primary Cardiologist: Runell Gess MD Milagros Loll, [place], [place]  HPI:  Kaitlin Smith is a 85 y.o.  mildly overweight widowed African-American female with no children who lives with her niece [name] who accompanies her today. I last saw her in the office  01/30/2018. She recently relocated from [facility] several months ago. She was referred by Dr. Leeanne Deed, her podiatrist, for peripheral vascular evaluation because of nonpalpable pulses. Her cardiovascular risk factor profile is notable for hyperlipidemia, diabetes and hypertension. She apparently set a stroke in March of this year with known carotid artery disease. She relates occlusion of her right carotid and known left carotid disease but she declined revascularization. She apparently also had a myocardial infarction in 2014 but was unaware of this. She gets occasional chest pain. She stopped smoking 2 years ago but was an infrequent smoker. She denies claudication. She does have symptoms compatible with diabetic peripheral neuropathy. Recent lower extremity arterial Doppler studies performed in our office 07/31/14 revealed ABIs of 0.8 bilaterally with high-grade bilateral SFA disease and severe tibial vessel disease. Her TBIs were any 0.5 range bilaterally as well. She really denies claudication but is limited by bilateral knee arthritis. Since I saw her year ago she's remained chronically stable. She is legally blind and essentially nonambulatory. She denies chest pain or shortness of breath but has had some shoulder and neck pain which may be her anginal equivalent.  Her EKG today does show septal Q waves with anterolateral T wave inversion new since her prior EKG.  I suspect she is had an intercurrent anterolateral out of hospital myocardial infarction, although a 2D echo performed 08/17/2018 revealed normal LV systolic function without a focal wall  motion abnormality.  Since I saw her a year and a half ago she is remained stable.  She was seen by Dr. 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  She denies chest pain or shortness of breath.  She is legally blind and for the most part nonambulatory.  She is a DNR..    Current Meds  Medication Sig   acetaminophen (TYLENOL) 325 MG tablet Take 2 tablets (650 mg total) by mouth every 6 (six) hours as needed for mild pain (temp > 101.5).   amLODipine (NORVASC) 10 MG tablet TAKE 1 TABLET (10 MG TOTAL) BY MOUTH DAILY.   calcium carbonate (OSCAL) 1500 (600 Ca) MG TABS tablet Take 1,500 mg by mouth daily with breakfast.   cloNIDine (CATAPRES) 0.2 MG tablet Take 1 tablet (0.2 mg total) by mouth 2 (two) times daily.   clopidogrel (PLAVIX) 75 MG tablet TAKE 1 TABLET (75 MG TOTAL) BY MOUTH DAILY.   ezetimibe (ZETIA) 10 MG tablet Take 1 tablet (10 mg total) by mouth daily. OFFICE VISIT NEEDED   FREESTYLE LITE test strip CHECK 3 TIMES PER DAY   hydrALAZINE (APRESOLINE) 25 MG tablet Take 1 tablet (25 mg total) by mouth 2 (two) times daily.   insulin glargine (LANTUS) 100 UNIT/ML injection Inject 0-0.1 mLs (0-10 Units total) into the skin at bedtime. Sliding scale (Patient taking differently: Inject 0-10 Units into the skin See admin instructions. Per Sliding scale)   insulin lispro (HUMALOG) 100 UNIT/ML injection INJECT 0.1 MILLILITERS (10 UNITS) INTO THE SKIN 3 TIMES A DAY WITH MEALS (Patient taking differently: Inject  0-10 Units into the skin See admin instructions. Per Sliding Scale 3 TIMES A DAY WITH MEALS)   Insulin Syringe-Needle U-100 (BD INSULIN SYRINGE ULTRAFINE) 31G X 15/64" 0.5 ML MISC Take insulin once nightly as needed   isosorbide mononitrate (IMDUR) 30 MG 24 hr tablet Take 0.5 tablets (15 mg total) by mouth daily.   ketoconazole (NIZORAL) 2 %  cream Apply 1 application topically daily. Apply to both feet and between toes once daily for 6 weeks   Lancets (FREESTYLE) lancets Check 3 times per day for E11.9   loratadine (CLARITIN) 10 MG tablet Take 10 mg by mouth daily.   pantoprazole (PROTONIX) 40 MG tablet TAKE 1 TABLET BY MOUTH EVERY DAY   polyethylene glycol powder (GLYCOLAX/MIRALAX) powder Take 17 g by mouth daily.   simvastatin (ZOCOR) 20 MG tablet TAKE 1 TABLET (20 MG TOTAL) BY MOUTH AT BEDTIME. OFFICE VISIT NEEDED   tamsulosin (FLOMAX) 0.4 MG CAPS capsule tamsulosin 0.4 mg capsule   VOLTAREN 1 % GEL APPLY 4 GRAMS TOPICALLY 2 TIMES DAILY AS NEEDED.     Allergies  Allergen Reactions   Milk-Related Compounds Other (See Comments)    Lactose intolerant.      Social History   Socioeconomic History   Marital status: Widowed    Spouse name: Not on file   Number of children: Not on file   Years of education: Not on file   Highest education level: Not on file  Occupational History   Not on file  Social Needs   Financial resource strain: Not on file   Food insecurity    Worry: Not on file    Inability: Not on file   Transportation needs    Medical: Not on file    Non-medical: Not on file  Tobacco Use   Smoking status: Former Smoker    Types: Cigarettes    Quit date: 06/06/2002    Years since quitting: 17.0   Smokeless tobacco: Never Used   Tobacco comment: Quit at age 85  Substance and Sexual Activity   Alcohol use: No    Alcohol/week: 0.0 standard drinks   Drug use: No   Sexual activity: Never  Lifestyle   Physical activity    Days per week: Not on file    Minutes per session: Not on file   Stress: Not on file  Relationships   Social connections    Talks on phone: Not on file    Gets together: Not on file    Attends religious service: Not on file    Active member of club or organization: Not on file    Attends meetings of clubs or organizations: Not on file    Relationship  status: Not on file   Intimate partner violence    Fear of current or ex partner: Not on file    Emotionally abused: Not on file    Physically abused: Not on file    Forced sexual activity: Not on file  Other Topics Concern   Not on file  Social History Narrative   Not on file     Review of Systems: General: negative for chills, fever, night sweats or weight changes.  Cardiovascular: negative for chest pain, dyspnea on exertion, edema, orthopnea, palpitations, paroxysmal nocturnal dyspnea or shortness of breath Dermatological: negative for rash Respiratory: negative for cough or wheezing Urologic: negative for hematuria Abdominal: negative for nausea, vomiting, diarrhea, bright red blood per rectum, melena, or hematemesis Neurologic: negative for visual changes, syncope, or dizziness All  other systems reviewed and are otherwise negative except as noted above.    Blood pressure 110/62, pulse 77, temperature (!) 97.2 F (36.2 C), height 5\' 5"  (1.651 m), weight 138 lb (62.6 kg).  General appearance: alert and no distress Neck: no adenopathy, no carotid bruit, no JVD, supple, symmetrical, trachea midline and thyroid not enlarged, symmetric, no tenderness/mass/nodules Lungs: clear to auscultation bilaterally Heart: regular rate and rhythm, S1, S2 normal, no murmur, click, rub or gallop Extremities: extremities normal, atraumatic, no cyanosis or edema Pulses: 2+ and symmetric Skin: Skin color, texture, turgor normal. No rashes or lesions Neurologic: Alert and oriented X 3, normal strength and tone. Normal symmetric reflexes. Normal coordination and gait  EKG sinus rhythm at 77 with borderline evidence of LVH, left axis deviation and poor R wave progression.  I personally reviewed this EKG.  ASSESSMENT AND PLAN:   Essential hypertension History of essential potential blood pressure measured today 110/62.  She is on amlodipine, clonidine and hydralazine.  Hyperlipidemia History  of hyperlipidemia on Zetia with lipid profile performed 05/15/2019 revealing total cholesterol 62, LDL 95 HDL 56  Peripheral arterial disease (HCC) History of peripheral arterial disease with Dopplers performed 2015 revealing ABIs in the 0.8 range bilaterally with high-grade bilateral SFA disease and severe tibial disease.  She denies claudication and is for the most part nonambulatory but does have bilateral knee pain probably from arthritis.  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 Donzetta Matters who did not feel that she required surgical intervention given her age and the fact that she is a DNR.      Lorretta Harp MD FACP,FACC,FAHA, [facility] 06/28/2019 4:11 PM

## 2019-06-28 NOTE — Assessment & Plan Note (Signed)
History of hyperlipidemia on Zetia with lipid profile performed 05/15/2019 revealing total cholesterol 62, LDL 95 HDL 56

## 2019-06-28 NOTE — Progress Notes (Signed)
EKGEK

## 2019-06-28 NOTE — Patient Instructions (Signed)
Medication Instructions:  Your physician recommends that you continue on your current medications as directed. Please refer to the Current Medication list given to you today.  If you need a refill on your cardiac medications before your next appointment, please call your pharmacy.   Lab work: none If you have labs (blood work) drawn today and your tests are completely normal, you will receive your results only by: . MyChart Message (if you have MyChart) OR . A paper copy in the mail If you have any lab test that is abnormal or we need to change your treatment, we will call you to review the results.  Testing/Procedures: none  Follow-Up: At [facility], you and your health needs are our priority.  As part of our continuing mission to provide you with exceptional heart care, we have created designated Provider Care Teams.  These Care Teams include your primary Cardiologist (physician) and Advanced Practice Providers (APPs -  Physician Assistants and Nurse Practitioners) who all work together to provide you with the care you need, when you need it. . You may schedule a follow up appointment as needed. You may see Dr. Berry or one of the following Advanced Practice Providers on your designated Care Team:   . Luke Kilroy, PA-C . Krista Kroeger, PA-C . Callie Goodrich, PA-C  

## 2019-06-28 NOTE — Assessment & Plan Note (Signed)
History of essential potential blood pressure measured today 110/62.  She is on amlodipine, clonidine and hydralazine.

## 2019-06-28 NOTE — Assessment & Plan Note (Signed)
History of peripheral arterial disease with Dopplers performed 2015 revealing ABIs in the 0.8 range bilaterally with high-grade bilateral SFA disease and severe tibial disease.  She denies claudication and is for the most part nonambulatory but does have bilateral knee pain probably from arthritis.  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 Donzetta Matters who did not feel that she required surgical intervention given her age and the fact that she is a DNR.

## 2019-07-09 ENCOUNTER — Other Ambulatory Visit: Payer: Self-pay | Admitting: Cardiovascular Disease

## 2019-07-09 ENCOUNTER — Other Ambulatory Visit: Payer: Self-pay

## 2019-07-09 ENCOUNTER — Ambulatory Visit: Payer: Federal, State, Local not specified - PPO | Admitting: Podiatry

## 2019-07-09 ENCOUNTER — Encounter: Payer: Self-pay | Admitting: Podiatry

## 2019-07-09 DIAGNOSIS — E1151 Type 2 diabetes mellitus with diabetic peripheral angiopathy without gangrene: Secondary | ICD-10-CM

## 2019-07-09 DIAGNOSIS — B351 Tinea unguium: Secondary | ICD-10-CM | POA: Diagnosis not present

## 2019-07-09 DIAGNOSIS — Z9229 Personal history of other drug therapy: Secondary | ICD-10-CM

## 2019-07-09 DIAGNOSIS — M79674 Pain in right toe(s): Secondary | ICD-10-CM | POA: Diagnosis not present

## 2019-07-09 DIAGNOSIS — Z8673 Personal history of transient ischemic attack (TIA), and cerebral infarction without residual deficits: Secondary | ICD-10-CM

## 2019-07-09 DIAGNOSIS — M79675 Pain in left toe(s): Secondary | ICD-10-CM | POA: Diagnosis not present

## 2019-07-09 NOTE — Patient Instructions (Signed)
Diabetes Mellitus and Foot Care Foot care is an important part of your health, especially when you have diabetes. Diabetes may cause you to have problems because of poor blood flow (circulation) to your feet and legs, which can cause your skin to:  Become thinner and drier.  Break more easily.  Heal more slowly.  Peel and crack. You may also have nerve damage (neuropathy) in your legs and feet, causing decreased feeling in them. This means that you may not notice minor injuries to your feet that could lead to more serious problems. Noticing and addressing any potential problems early is the best way to prevent future foot problems. How to care for your feet Foot hygiene  Wash your feet daily with warm water and mild soap. Do not use hot water. Then, pat your feet and the areas between your toes until they are completely dry. Do not soak your feet as this can dry your skin.  Trim your toenails straight across. Do not dig under them or around the cuticle. File the edges of your nails with an emery board or nail file.  Apply a moisturizing lotion or petroleum jelly to the skin on your feet and to dry, brittle toenails. Use lotion that does not contain alcohol and is unscented. Do not apply lotion between your toes. Shoes and socks  Wear clean socks or stockings every day. Make sure they are not too tight. Do not wear knee-high stockings since they may decrease blood flow to your legs.  Wear shoes that fit properly and have enough cushioning. Always look in your shoes before you put them on to be sure there are no objects inside.  To break in new shoes, wear them for just a few hours a day. This prevents injuries on your feet. Wounds, scrapes, corns, and calluses  Check your feet daily for blisters, cuts, bruises, sores, and redness. If you cannot see the bottom of your feet, use a mirror or ask someone for help.  Do not cut corns or calluses or try to remove them with medicine.  If you  find a minor scrape, cut, or break in the skin on your feet, keep it and the skin around it clean and dry. You may clean these areas with mild soap and water. Do not clean the area with peroxide, alcohol, or iodine.  If you have a wound, scrape, corn, or callus on your foot, look at it several times a day to make sure it is healing and not infected. Check for: ? Redness, swelling, or pain. ? Fluid or blood. ? Warmth. ? Pus or a bad smell. General instructions  Do not cross your legs. This may decrease blood flow to your feet.  Do not use heating pads or hot water bottles on your feet. They may burn your skin. If you have lost feeling in your feet or legs, you may not know this is happening until it is too late.  Protect your feet from hot and cold by wearing shoes, such as at the beach or on hot pavement.  Schedule a complete foot exam at least once a year (annually) or more often if you have foot problems. If you have foot problems, report any cuts, sores, or bruises to your health care provider immediately. Contact a health care provider if:  You have a medical condition that increases your risk of infection and you have any cuts, sores, or bruises on your feet.  You have an injury that is not   healing.  You have redness on your legs or feet.  You feel burning or tingling in your legs or feet.  You have pain or cramps in your legs and feet.  Your legs or feet are numb.  Your feet always feel cold.  You have pain around a toenail. Get help right away if:  You have a wound, scrape, corn, or callus on your foot and: ? You have pain, swelling, or redness that gets worse. ? You have fluid or blood coming from the wound, scrape, corn, or callus. ? Your wound, scrape, corn, or callus feels warm to the touch. ? You have pus or a bad smell coming from the wound, scrape, corn, or callus. ? You have a fever. ? You have a red line going up your leg. Summary  Check your feet every day  for cuts, sores, red spots, swelling, and blisters.  Moisturize feet and legs daily.  Wear shoes that fit properly and have enough cushioning.  If you have foot problems, report any cuts, sores, or bruises to your health care provider immediately.  Schedule a complete foot exam at least once a year (annually) or more often if you have foot problems. This information is not intended to replace advice given to you by your health care provider. Make sure you discuss any questions you have with your health care provider. Document Released: 09/02/2000 Document Revised: 10/18/2017 Document Reviewed: 10/07/2016 Elsevier Patient Education  2020 [employer]  

## 2019-07-14 NOTE — Progress Notes (Signed)
Subjective: Kaitlin Smith is seen today for follow up painful, elongated, thickened toenails 1-5 b/l feet that she cannot cut. Pain interferes with daily activities. Aggravating factor includes wearing enclosed shoe gear and relieved with periodic debridement.  She is diabetic and on blood thinner, Plavix.  Her daughter is present during the visit. They voice no new pedal problems on today's visit.  Current Outpatient Medications on File Prior to Visit  Medication Sig  . acetaminophen (TYLENOL) 325 MG tablet Take 2 tablets (650 mg total) by mouth every 6 (six) hours as needed for mild pain (temp > 101.5).  Marland Kitchen amLODipine (NORVASC) 10 MG tablet TAKE 1 TABLET (10 MG TOTAL) BY MOUTH DAILY.  . calcium carbonate (OSCAL) 1500 (600 Ca) MG TABS tablet Take 1,500 mg by mouth daily with breakfast.  . cloNIDine (CATAPRES) 0.2 MG tablet Take 1 tablet (0.2 mg total) by mouth 2 (two) times daily.  . clopidogrel (PLAVIX) 75 MG tablet TAKE 1 TABLET (75 MG TOTAL) BY MOUTH DAILY.  Marland Kitchen FREESTYLE LITE test strip CHECK 3 TIMES PER DAY  . hydrALAZINE (APRESOLINE) 25 MG tablet Take 1 tablet (25 mg total) by mouth 2 (two) times daily.  . insulin glargine (LANTUS) 100 UNIT/ML injection Inject 0-0.1 mLs (0-10 Units total) into the skin at bedtime. Sliding scale (Patient taking differently: Inject 0-10 Units into the skin See admin instructions. Per Sliding scale)  . insulin lispro (HUMALOG) 100 UNIT/ML injection INJECT 0.1 MILLILITERS (10 UNITS) INTO THE SKIN 3 TIMES A DAY WITH MEALS (Patient taking differently: Inject 0-10 Units into the skin See admin instructions. Per Sliding Scale 3 TIMES A DAY WITH MEALS)  . Insulin Syringe-Needle U-100 (BD INSULIN SYRINGE ULTRAFINE) 31G X 15/64" 0.5 ML MISC Take insulin once nightly as needed  . isosorbide mononitrate (IMDUR) 30 MG 24 hr tablet Take 0.5 tablets (15 mg total) by mouth daily.  Marland Kitchen ketoconazole (NIZORAL) 2 % cream Apply 1 application topically daily. Apply to both feet and  between toes once daily for 6 weeks  . Lancets (FREESTYLE) lancets Check 3 times per day for E11.9  . loratadine (CLARITIN) 10 MG tablet Take 10 mg by mouth daily.  . pantoprazole (PROTONIX) 40 MG tablet TAKE 1 TABLET BY MOUTH EVERY DAY  . polyethylene glycol powder (GLYCOLAX/MIRALAX) powder Take 17 g by mouth daily.  . simvastatin (ZOCOR) 20 MG tablet TAKE 1 TABLET (20 MG TOTAL) BY MOUTH AT BEDTIME. OFFICE VISIT NEEDED  . tamsulosin (FLOMAX) 0.4 MG CAPS capsule tamsulosin 0.4 mg capsule  . VOLTAREN 1 % GEL APPLY 4 GRAMS TOPICALLY 2 TIMES DAILY AS NEEDED.   No current facility-administered medications on file prior to visit.      Allergies  Allergen Reactions  . Milk-Related Compounds Other (See Comments)    Lactose intolerant.       Objective:  Vascular Examination: Capillary refill time immediate x 10 digits.  Dorsalis pedis nonpalpable b/l.  Posterior tibial pulses nonpalpable b/l.  Digital hair absent b/l.  Skin temperature gradient WNL b/l.   Dermatological Examination: Skin thin, shiny and atrophic b/l.  Toenails 1-5 b/l discolored, thick, dystrophic with subungual debris and pain with palpation to nailbeds due to thickness of nails.  Musculoskeletal: Muscle strength 5/5 to all LE muscle groups b/l.  No pain, crepitus or joint limitation noted with ROM.   Neurological Examination: Protective sensation intact with 10 gram monofilament bilaterally.  Assessment: Painful onychomycosis toenails 1-5 b/l  NIDDM with PAD Pt on long term blood thinner  Plan: 1.  Toenails 1-5 b/l were debrided in length and girth without iatrogenic bleeding. 2. Patient to continue soft, supportive shoe gear 3. Patient to report any pedal injuries to medical professional immediately. 4. Follow up 3 months.  5. Patient/POA to call should there be a concern in the interim.

## 2019-07-15 ENCOUNTER — Other Ambulatory Visit: Payer: Self-pay | Admitting: Cardiovascular Disease

## 2019-07-15 DIAGNOSIS — Z8673 Personal history of transient ischemic attack (TIA), and cerebral infarction without residual deficits: Secondary | ICD-10-CM

## 2019-07-21 ENCOUNTER — Other Ambulatory Visit: Payer: Self-pay | Admitting: Cardiovascular Disease

## 2019-07-23 NOTE — Telephone Encounter (Signed)
Rx request sent to pharmacy.  

## 2019-09-07 ENCOUNTER — Other Ambulatory Visit: Payer: Self-pay | Admitting: Cardiovascular Disease

## 2019-09-17 ENCOUNTER — Other Ambulatory Visit: Payer: Self-pay | Admitting: Cardiovascular Disease

## 2019-09-17 DIAGNOSIS — Z8673 Personal history of transient ischemic attack (TIA), and cerebral infarction without residual deficits: Secondary | ICD-10-CM

## 2019-10-15 ENCOUNTER — Ambulatory Visit: Payer: Medicare Other | Admitting: Podiatry

## 2019-10-19 ENCOUNTER — Other Ambulatory Visit: Payer: Self-pay | Admitting: Cardiovascular Disease

## 2019-10-19 DIAGNOSIS — Z8673 Personal history of transient ischemic attack (TIA), and cerebral infarction without residual deficits: Secondary | ICD-10-CM

## 2019-11-15 ENCOUNTER — Other Ambulatory Visit: Payer: Self-pay | Admitting: Cardiovascular Disease

## 2019-11-15 DIAGNOSIS — Z8673 Personal history of transient ischemic attack (TIA), and cerebral infarction without residual deficits: Secondary | ICD-10-CM

## 2019-11-25 ENCOUNTER — Encounter: Payer: Self-pay | Admitting: Podiatry

## 2019-11-25 ENCOUNTER — Ambulatory Visit (INDEPENDENT_AMBULATORY_CARE_PROVIDER_SITE_OTHER): Payer: Federal, State, Local not specified - PPO | Admitting: Podiatry

## 2019-11-25 ENCOUNTER — Other Ambulatory Visit: Payer: Self-pay

## 2019-11-25 VITALS — Temp 98.4°F

## 2019-11-25 DIAGNOSIS — E1151 Type 2 diabetes mellitus with diabetic peripheral angiopathy without gangrene: Secondary | ICD-10-CM | POA: Diagnosis not present

## 2019-11-25 DIAGNOSIS — B351 Tinea unguium: Secondary | ICD-10-CM

## 2019-11-25 DIAGNOSIS — M79675 Pain in left toe(s): Secondary | ICD-10-CM

## 2019-11-25 DIAGNOSIS — M79674 Pain in right toe(s): Secondary | ICD-10-CM | POA: Diagnosis not present

## 2019-11-25 NOTE — Patient Instructions (Signed)
Diabetes Mellitus and Foot Care Foot care is an important part of your health, especially when you have diabetes. Diabetes may cause you to have problems because of poor blood flow (circulation) to your feet and legs, which can cause your skin to:  Become thinner and drier.  Break more easily.  Heal more slowly.  Peel and crack. You may also have nerve damage (neuropathy) in your legs and feet, causing decreased feeling in them. This means that you may not notice minor injuries to your feet that could lead to more serious problems. Noticing and addressing any potential problems early is the best way to prevent future foot problems. How to care for your feet Foot hygiene  Wash your feet daily with warm water and mild soap. Do not use hot water. Then, pat your feet and the areas between your toes until they are completely dry. Do not soak your feet as this can dry your skin.  Trim your toenails straight across. Do not dig under them or around the cuticle. File the edges of your nails with an emery board or nail file.  Apply a moisturizing lotion or petroleum jelly to the skin on your feet and to dry, brittle toenails. Use lotion that does not contain alcohol and is unscented. Do not apply lotion between your toes. Shoes and socks  Wear clean socks or stockings every day. Make sure they are not too tight. Do not wear knee-high stockings since they may decrease blood flow to your legs.  Wear shoes that fit properly and have enough cushioning. Always look in your shoes before you put them on to be sure there are no objects inside.  To break in new shoes, wear them for just a few hours a day. This prevents injuries on your feet. Wounds, scrapes, corns, and calluses  Check your feet daily for blisters, cuts, bruises, sores, and redness. If you cannot see the bottom of your feet, use a mirror or ask someone for help.  Do not cut corns or calluses or try to remove them with medicine.  If you  find a minor scrape, cut, or break in the skin on your feet, keep it and the skin around it clean and dry. You may clean these areas with mild soap and water. Do not clean the area with peroxide, alcohol, or iodine.  If you have a wound, scrape, corn, or callus on your foot, look at it several times a day to make sure it is healing and not infected. Check for: ? Redness, swelling, or pain. ? Fluid or blood. ? Warmth. ? Pus or a bad smell. General instructions  Do not cross your legs. This may decrease blood flow to your feet.  Do not use heating pads or hot water bottles on your feet. They may burn your skin. If you have lost feeling in your feet or legs, you may not know this is happening until it is too late.  Protect your feet from hot and cold by wearing shoes, such as at the beach or on hot pavement.  Schedule a complete foot exam at least once a year (annually) or more often if you have foot problems. If you have foot problems, report any cuts, sores, or bruises to your health care provider immediately. Contact a health care provider if:  You have a medical condition that increases your risk of infection and you have any cuts, sores, or bruises on your feet.  You have an injury that is not   healing.  You have redness on your legs or feet.  You feel burning or tingling in your legs or feet.  You have pain or cramps in your legs and feet.  Your legs or feet are numb.  Your feet always feel cold.  You have pain around a toenail. Get help right away if:  You have a wound, scrape, corn, or callus on your foot and: ? You have pain, swelling, or redness that gets worse. ? You have fluid or blood coming from the wound, scrape, corn, or callus. ? Your wound, scrape, corn, or callus feels warm to the touch. ? You have pus or a bad smell coming from the wound, scrape, corn, or callus. ? You have a fever. ? You have a red line going up your leg. Summary  Check your feet every day  for cuts, sores, red spots, swelling, and blisters.  Moisturize feet and legs daily.  Wear shoes that fit properly and have enough cushioning.  If you have foot problems, report any cuts, sores, or bruises to your health care provider immediately.  Schedule a complete foot exam at least once a year (annually) or more often if you have foot problems. This information is not intended to replace advice given to you by your health care provider. Make sure you discuss any questions you have with your health care provider. Document Revised: 05/29/2019 Document Reviewed: 10/07/2016 Else[employer] Patient Education  2020 [employer]  Peripheral Vascular Disease Peripheral vascular disease (PVD) is a disease of the blood vessels. A simple term for PVD is poor circulation. In most cases, PVD narrows the blood vessels that carry blood from your heart to the rest of your body. This can result in a decreased supply of blood to your arms, legs, and internal organs, like your stomach or kidneys. However, it most often affects a person's lower legs and feet. There are two types of PVD.  Organic PVD. This is the more common type. It is caused by damage to the structure of blood vessels.  Functional PVD. This is caused by conditions that make blood vessels contract and tighten (spasm). Without treatment, PVD tends to get worse over time. PVD can also lead to acute limb ischemia. This is when an arm or leg suddenly has trouble getting enough blood. This is a medical emergency. What are the causes?  Each type of PVD has many different causes. The most common cause of PVD is buildup of a fatty material (plaque) inside your arteries (atherosclerosis). Small amounts of plaque can break off from the walls of the blood vessels and become lodged in a smaller artery. This blocks blood flow and can cause acute limb ischemia. Other common causes of PVD include:  Blood clots that form inside of blood vessels.  Injuries  to blood vessels.  Diseases that cause inflammation of blood vessels or cause blood vessel spasms.  Health behaviors and health history that increase your risk of developing PVD. What increases the risk? You are more likely to develop this condition if:  You have a family history of PVD.  You have certain medical conditions, including: ? High cholesterol. ? Diabetes. ? High blood pressure (hypertension). ? Coronary heart disease. ? Past problems with blood clots. ? Past injury, such as burns or a broken bone. These may have damaged blood vessels in your limbs. ? Buerger disease. This is caused by inflamed blood vessels in your hands and feet. ? Some forms of arthritis. ? Rare birth defects   that affect the arteries in your legs. ? Kidney disease.  You use tobacco or smoke.  You do not get enough exercise.  You are obese.  You are age 50 or older. What are the signs or symptoms? This condition may cause different symptoms. Your symptoms depend on what part of your body is not getting enough blood. Some common signs and symptoms include:  Cramps in your lower legs. This may be a symptom of poor leg circulation (claudication).  Pain and weakness in your legs. This happens while you are physically active but goes away when you rest (intermittent claudication).  Leg pain when at rest.  Leg numbness, tingling, or weakness.  Coldness in a leg or foot, especially when compared with the other leg.  Skin or hair changes. These can include: ? Hair loss. ? Shiny skin. ? Pale or bluish skin. ? Thick toenails.  Inability to get or maintain an erection (erectile dysfunction).  Fatigue. People with PVD are more likely to develop ulcers and sores on their toes, feet, or legs. These may take longer than normal to heal. How is this diagnosed? This condition is diagnosed based on:  Your signs and symptoms.  A physical exam and your medical history.  Other tests to find out what  is causing your PVD and to determine its severity. Tests may include: ? Blood pressure recordings from your arms and legs and measurements of the strength of your pulses (pulse volume recordings). ? Imaging studies using sound waves to take pictures of the blood flow through your blood vessels (Doppler ultrasound). ? Injecting a dye into your blood vessels before having imaging studies using:  X-rays (angiogram or arteriogram).  Computer-generated X-rays (CT angiogram).  A powerful electromagnetic field and a computer (magnetic resonance angiogram or MRA). How is this treated? Treatment for PVD depends on the cause of your condition and how severe your symptoms are. It also depends on your age. Underlying causes need to be treated and controlled. These include long-term (chronic) conditions, such as diabetes, high cholesterol, and high blood pressure. Treatment includes:  Lifestyle changes, such as: ? Quitting smoking. ? Exercising regularly. ? Following a low-fat, low-cholesterol diet.  Taking medicines, such as: ? Blood thinners to prevent blood clots. ? Medicines to improve blood flow. ? Medicines to improve your blood cholesterol levels.  Surgical procedures, such as: ? A procedure that uses an inflated balloon to open a blocked artery and improve blood flow (angioplasty). ? A procedure to put in a wire mesh tube to keep a blocked artery open (stent implant). ? Surgery to reroute blood flow around a blocked artery (peripheral bypass surgery). ? Surgery to remove dead tissue from an infected wound on the affected limb. ? Amputation. This is surgical removal of the affected limb. It may be necessary in cases of acute limb ischemia where there has been no improvement through medical or surgical treatments. Follow these instructions at home: Lifestyle  Do not use any products that contain nicotine or tobacco, such as cigarettes and e-cigarettes. If you need help quitting, ask your  health care provider.  Lose weight if you are overweight, and maintain a healthy weight as discussed by your health care provider.  Eat a diet that is low in fat and cholesterol. If you need help, ask your health care provider.  Exercise regularly. Ask your health care provider to suggest some good activities for you. General instructions  Take over-the-counter and prescription medicines only as told by your health   care provider.  Take good care of your feet: ? Wear comfortable shoes that fit well. ? Check your feet often for any cuts or sores.  Keep all follow-up visits as told by your health care provider. This is important. Contact a health care provider if:  You have cramps in your legs while walking.  You have leg pain when you are at rest.  You have coldness in a leg or foot.  Your skin changes.  You have erectile dysfunction.  You have cuts or sores on your feet that are not healing. Get help right away if:  Your arm or leg turns cold, numb, and blue.  Your arms or legs become red, warm, swollen, painful, or numb.  You have chest pain or trouble breathing.  You suddenly have weakness in your face, arm, or leg.  You become very confused or lose the ability to speak.  You suddenly have a very bad headache or lose your vision. Summary  Peripheral vascular disease (PVD) is a disease of the blood vessels.  In most cases, PVD narrows the blood vessels that carry blood from your heart to the rest of your body.  PVD may cause different symptoms. Your symptoms depend on what part of your body is not getting enough blood.  Treatment for PVD depends on the cause of your condition and how severe your symptoms are. This information is not intended to replace advice given to you by your health care provider. Make sure you discuss any questions you have with your health care provider. Document Revised: 08/18/2017 Document Reviewed: 10/13/2016 Elsevier Patient Education   2020 [employer]  

## 2019-12-02 NOTE — Progress Notes (Signed)
Subjective: Kaitlin Smith presents today for follow up of preventative diabetic foot care, at risk foot care. Patient has history of NIDDM with PAD and painful mycotic nails b/l that are difficult to trim. Pain interferes with ambulation. Aggravating factors include wearing enclosed shoe gear. Pain is relieved with periodic professional debridement.   Daughter is present during the visit. They voice no new pedal concerns on today's visit.  Allergies  Allergen Reactions  . Milk-Related Compounds Other (See Comments)    Lactose intolerant.       Objective: Vitals:   11/25/19 1608  Temp: 98.4 F (36.9 C)    Pt 86 y.o. year old AA female WD, WN in NAD. AAO x 3.   Vascular Examination:  Capillary fill time to digits <3 seconds b/l. Nonpalpable DP pulses b/l. Nonpalpable PT pulses b/l. Pedal hair absent b/l Skin temperature gradient warm to cool b/l.  Dermatological Examination: Pedal skin is thin shiny, atrophic bilaterally. No open wounds bilaterally. No interdigital macerations bilaterally. Toenails 1-5 b/l elongated, dystrophic, thickened, crumbly with subungual debris and tenderness to dorsal palpation.  Musculoskeletal: Normal muscle strength 5/5 to all lower extremity muscle groups bilaterally, no pain crepitus or joint limitation noted with ROM b/l and bunion deformity noted b/l  Neurological: Protective sensation intact 5/5 intact bilaterally with 10g monofilament b/l  Assessment: 1. Pain due to onychomycosis of toenails of both feet   2. Type II diabetes mellitus with peripheral circulatory disorder (HCC)    Plan: -Continue diabetic foot care principles. Literature dispensed on today.  -Toenails 1-5 b/l were debrided in length and girth with sterile nail nippers and dremel without iatrogenic bleeding.  -Patient to continue soft, supportive shoe gear daily. -Patient to report any pedal injuries to medical professional immediately. -Patient/POA to call should there be  question/concern in the interim.  Return in about 3 months (around 02/25/2020) for diabetic nail trim/ Plavix.

## 2020-02-24 ENCOUNTER — Ambulatory Visit (INDEPENDENT_AMBULATORY_CARE_PROVIDER_SITE_OTHER): Payer: Federal, State, Local not specified - PPO | Admitting: Podiatry

## 2020-02-24 ENCOUNTER — Encounter: Payer: Self-pay | Admitting: Podiatry

## 2020-02-24 ENCOUNTER — Other Ambulatory Visit: Payer: Self-pay

## 2020-02-24 DIAGNOSIS — M79675 Pain in left toe(s): Secondary | ICD-10-CM | POA: Diagnosis not present

## 2020-02-24 DIAGNOSIS — M79674 Pain in right toe(s): Secondary | ICD-10-CM

## 2020-02-24 DIAGNOSIS — B351 Tinea unguium: Secondary | ICD-10-CM

## 2020-02-24 DIAGNOSIS — E1151 Type 2 diabetes mellitus with diabetic peripheral angiopathy without gangrene: Secondary | ICD-10-CM

## 2020-02-24 NOTE — Patient Instructions (Addendum)
Apply antibiotic ointment to left heel once daily.  Float heels at all times when in bed.  Pressure Injury  A pressure injury is damage to the skin and underlying tissue that results from pressure being applied to an area of the body. It often affects people who must spend a long time in a bed or chair because of a medical condition. Pressure injuries usually occur:  Over bony parts of the body, such as the tailbone, shoulders, elbows, hips, heels, spine, ankles, and back of the head.  Under medical devices that make contact with the body, such as respiratory equipment, stockings, tubes, and splints. Pressure injuries start as reddened areas on the skin and can lead to pain and an open wound. What are the causes? This condition is caused by frequent or constant pressure to an area of the body. Decreased blood flow to the skin can eventually cause the skin tissue to die and break down, causing a wound. What increases the risk? You are more likely to develop this condition if you:  Are in the hospital or an extended care facility.  Are bedridden or in a wheelchair.  Have an injury or disease that keeps you from: ? Moving normally. ? Feeling pain or pressure.  Have a condition that: ? Makes you sleepy or less alert. ? Causes poor blood flow.  Need to wear a medical device.  Have poor control of your bladder or bowel functions (incontinence).  Have poor nutrition (malnutrition). If you are at risk for pressure injuries, your health care provider may recommend certain types of mattresses, mattress covers, pillows, cushions, or boots to help prevent them. These may include products filled with air, foam, gel, or sand. What are the signs or symptoms? Symptoms of this condition depend on the severity of the injury. Symptoms may include:  Red or dark areas of the skin.  Pain, warmth, or a change of skin texture.  Blisters.  An open wound. How is this diagnosed? This condition is  diagnosed with a medical history and physical exam. You may also have tests, such as:  Blood tests.  Imaging tests.  Blood flow tests. Your pressure injury will be staged based on its severity. Staging is based on:  The depth of the tissue injury, including whether there is exposure of muscle, bone, or tendon.  The cause of the pressure injury. How is this treated? This condition may be treated by:  Relieving or redistributing pressure on your skin. This includes: ? Frequently changing your position. ? Avoiding positions that caused the wound or that can make the wound worse. ? Using specific bed mattresses, chair cushions, or protective boots. ? Moving medical devices from an area of pressure, or placing padding between the skin and the device. ? Using foams, creams, or powders to prevent rubbing (friction) on the skin.  Keeping your skin clean and dry. This may include using a skin cleanser or skin barrier as told by your health care provider.  Cleaning your injury and removing any dead tissue from the wound (debridement).  Placing a bandage (dressing) over your injury.  Using medicines for pain or to prevent or treat infection. Surgery may be needed if other treatments are not working or if your injury is very deep. Follow these instructions at home: Wound care  Follow instructions from your health care provider about how to take care of your wound. Make sure you: ? Wash your hands with soap and water before and after you change your  bandage (dressing). If soap and water are not available, use hand sanitizer. ? Change your dressing as told by your health care provider.  Check your wound every day for signs of infection. Have a caregiver do this for you if you are not able. Check for: ? Redness, swelling, or increased pain. ? More fluid or blood. ? Warmth. ? Pus or a bad smell. Skin care  Keep your skin clean and dry. Gently pat your skin dry.  Do not rub or massage  your skin.  You or a caregiver should check your skin every day for any changes in color or any new blisters or sores (ulcers). Medicines  Take over-the-counter and prescription medicines only as told by your health care provider.  If you were prescribed an antibiotic medicine, take or apply it as told by your health care provider. Do not stop using the antibiotic even if your condition improves. Reducing and redistributing pressure  Do not lie or sit in one position for a long time. Move or change position every 1-2 hours, or as told by your health care provider.  Use pillows or cushions to reduce pressure. Ask your health care provider to recommend cushions or pads for you. General instructions   Eat a healthy diet that includes lots of protein.  Drink enough fluid to keep your urine pale yellow.  Be as active as you can every day. Ask your health care provider to suggest safe exercises or activities.  Do not abuse drugs or alcohol.  Do not use any products that contain nicotine or tobacco, such as cigarettes, e-cigarettes, and chewing tobacco. If you need help quitting, ask your health care provider.  Keep all follow-up visits as told by your health care provider. This is important. Contact a health care provider if:  You have: ? A fever or chills. ? Pain that is not helped by medicine. ? Any changes in skin color. ? New blisters or sores. ? Pus or a bad smell coming from your wound. ? Redness, swelling, or pain around your wound. ? More fluid or blood coming from your wound.  Your wound does not improve after 1-2 weeks of treatment. Summary  A pressure injury is damage to the skin and underlying tissue that results from pressure being applied to an area of the body.  Do not lie or sit in one position for a long time. Your health care provider may advise you to move or change position every 1-2 hours.  Follow instructions from your health care provider about how to  take care of your wound.  Keep all follow-up visits as told by your health care provider. This is important. This information is not intended to replace advice given to you by your health care provider. Make sure you discuss any questions you have with your health care provider. Document Revised: 04/04/2018 Document Reviewed: 04/04/2018 Elsevier Patient Education  2020 [employer]  Diabetes Mellitus and Foot Care Foot care is an important part of your health, especially when you have diabetes. Diabetes may cause you to have problems because of poor blood flow (circulation) to your feet and legs, which can cause your skin to:  Become thinner and drier.  Break more easily.  Heal more slowly.  Peel and crack. You may also have nerve damage (neuropathy) in your legs and feet, causing decreased feeling in them. This means that you may not notice minor injuries to your feet that could lead to more serious problems. Noticing and addressing  any potential problems early is the best way to prevent future foot problems. How to care for your feet Foot hygiene  Wash your feet daily with warm water and mild soap. Do not use hot water. Then, pat your feet and the areas between your toes until they are completely dry. Do not soak your feet as this can dry your skin.  Trim your toenails straight across. Do not dig under them or around the cuticle. File the edges of your nails with an emery board or nail file.  Apply a moisturizing lotion or petroleum jelly to the skin on your feet and to dry, brittle toenails. Use lotion that does not contain alcohol and is unscented. Do not apply lotion between your toes. Shoes and socks  Wear clean socks or stockings every day. Make sure they are not too tight. Do not wear knee-high stockings since they may decrease blood flow to your legs.  Wear shoes that fit properly and have enough cushioning. Always look in your shoes before you put them on to be sure there  are no objects inside.  To break in new shoes, wear them for just a few hours a day. This prevents injuries on your feet. Wounds, scrapes, corns, and calluses  Check your feet daily for blisters, cuts, bruises, sores, and redness. If you cannot see the bottom of your feet, use a mirror or ask someone for help.  Do not cut corns or calluses or try to remove them with medicine.  If you find a minor scrape, cut, or break in the skin on your feet, keep it and the skin around it clean and dry. You may clean these areas with mild soap and water. Do not clean the area with peroxide, alcohol, or iodine.  If you have a wound, scrape, corn, or callus on your foot, look at it several times a day to make sure it is healing and not infected. Check for: ? Redness, swelling, or pain. ? Fluid or blood. ? Warmth. ? Pus or a bad smell. General instructions  Do not cross your legs. This may decrease blood flow to your feet.  Do not use heating pads or hot water bottles on your feet. They may burn your skin. If you have lost feeling in your feet or legs, you may not know this is happening until it is too late.  Protect your feet from hot and cold by wearing shoes, such as at the beach or on hot pavement.  Schedule a complete foot exam at least once a year (annually) or more often if you have foot problems. If you have foot problems, report any cuts, sores, or bruises to your health care provider immediately. Contact a health care provider if:  You have a medical condition that increases your risk of infection and you have any cuts, sores, or bruises on your feet.  You have an injury that is not healing.  You have redness on your legs or feet.  You feel burning or tingling in your legs or feet.  You have pain or cramps in your legs and feet.  Your legs or feet are numb.  Your feet always feel cold.  You have pain around a toenail. Get help right away if:  You have a wound, scrape, corn, or  callus on your foot and: ? You have pain, swelling, or redness that gets worse. ? You have fluid or blood coming from the wound, scrape, corn, or callus. ? Your wound, scrape,  corn, or callus feels warm to the touch. ? You have pus or a bad smell coming from the wound, scrape, corn, or callus. ? You have a fever. ? You have a red line going up your leg. Summary  Check your feet every day for cuts, sores, red spots, swelling, and blisters.  Moisturize feet and legs daily.  Wear shoes that fit properly and have enough cushioning.  If you have foot problems, report any cuts, sores, or bruises to your health care provider immediately.  Schedule a complete foot exam at least once a year (annually) or more often if you have foot problems. This information is not intended to replace advice given to you by your health care provider. Make sure you discuss any questions you have with your health care provider. Document Revised: 05/29/2019 Document Reviewed: 10/07/2016 Elsevier Patient Education  [place].

## 2020-02-28 NOTE — Progress Notes (Signed)
Subjective: Kaitlin Smith presents today at risk foot care. Pt has h/o NIDDM with PAD and painful mycotic nails b/l that are difficult to trim. Pain interferes with ambulation. Aggravating factors include wearing enclosed shoe gear. Pain is relieved with periodic professional debridement.   She is accompanied by her niece on today's visit. They voice no new pedal concerns on today's visit.  Kaitlin Hatchet, MD is patient's PCP.  Past Medical History:  Diagnosis Date   Allergy    Arthritis    Blindness    Coronary artery disease    Diabetes mellitus without complication (HCC)    Glaucoma    Hyperlipidemia    Hypertension    Myocardial infarction [facility])    Peripheral vascular disease ([place])    Stroke [facility])      Current Outpatient Medications on File Prior to Visit  Medication Sig Dispense Refill   acetaminophen (TYLENOL) 325 MG tablet Take 2 tablets (650 mg total) by mouth every 6 (six) hours as needed for mild pain (temp > 101.5).     amLODipine (NORVASC) 10 MG tablet Take 1 tablet (10 mg total) by mouth daily. 90 tablet 3   calcium carbonate (OSCAL) 1500 (600 Ca) MG TABS tablet Take 1,500 mg by mouth daily with breakfast.     cloNIDine (CATAPRES) 0.2 MG tablet Take 1 tablet (0.2 mg total) by mouth 2 (two) times daily. 60 tablet 6   clopidogrel (PLAVIX) 75 MG tablet TAKE 1 TABLET (75 MG TOTAL) BY MOUTH DAILY. 90 tablet 2   ezetimibe (ZETIA) 10 MG tablet Take 1 tablet (10 mg total) by mouth daily. 90 tablet 3   FREESTYLE LITE test strip CHECK 3 TIMES PER DAY 100 each 3   gabapentin (NEURONTIN) 300 MG capsule Take 300 mg by mouth daily.     hydrALAZINE (APRESOLINE) 25 MG tablet Take 1 tablet (25 mg total) by mouth 2 (two) times daily. 60 tablet 0   insulin glargine (LANTUS) 100 UNIT/ML injection Inject 0-0.1 mLs (0-10 Units total) into the skin at bedtime. Sliding scale (Patient taking differently: Inject 0-10 Units into the skin See admin instructions. Per Sliding  scale) 10 mL 3   insulin lispro (HUMALOG) 100 UNIT/ML injection INJECT 0.1 MILLILITERS (10 UNITS) INTO THE SKIN 3 TIMES A DAY WITH MEALS (Patient taking differently: Inject 0-10 Units into the skin See admin instructions. Per Sliding Scale 3 TIMES A DAY WITH MEALS) 10 mL 0   Insulin Syringe-Needle U-100 (BD INSULIN SYRINGE ULTRAFINE) 31G X 15/64" 0.5 ML MISC Take insulin once nightly as needed 90 each 3   isosorbide mononitrate (IMDUR) 30 MG 24 hr tablet Take 0.5 tablets (15 mg total) by mouth daily. 30 tablet 3   ketoconazole (NIZORAL) 2 % cream Apply 1 application topically daily. Apply to both feet and between toes once daily for 6 weeks 30 g 1   Lancets (FREESTYLE) lancets Check 3 times per day for E11.9 100 each 12   loratadine (CLARITIN) 10 MG tablet Take 10 mg by mouth daily.     LUMIGAN 0.01 % SOLN 1 drop at bedtime.     nitrofurantoin, macrocrystal-monohydrate, (MACROBID) 100 MG capsule nitrofurantoin monohydrate/macrocrystals 100 mg capsule     pantoprazole (PROTONIX) 40 MG tablet TAKE 1 TABLET BY MOUTH EVERY DAY 30 tablet 6   polyethylene glycol powder (GLYCOLAX/MIRALAX) powder Take 17 g by mouth daily. 850 g 4   SIMBRINZA 1-0.2 % SUSP      simvastatin (ZOCOR) 20 MG tablet Take 1 tablet (20 mg  total) by mouth at bedtime. OFFICE VISIT NEEDED 30 tablet 6   tamsulosin (FLOMAX) 0.4 MG CAPS capsule tamsulosin 0.4 mg capsule     Vitamin D, Ergocalciferol, (DRISDOL) 1.25 MG (50000 UNIT) CAPS capsule Take 50,000 Units by mouth once a week.     VOLTAREN 1 % GEL APPLY 4 GRAMS TOPICALLY 2 TIMES DAILY AS NEEDED. 100 g 0   No current facility-administered medications on file prior to visit.     Allergies  Allergen Reactions   Milk-Related Compounds Other (See Comments)    Lactose intolerant.      Objective: Kaitlin Smith is a pleasant 86 y.o. y.o. Patient Race: Black or African American [2]  female in NAD. AAO x 3.  There were no vitals filed for this visit.  Vascular  Examination: Capillary fill time to digits <3 seconds b/l lower extremities. Nonpalpable DP pulse(s) b/l lower extremities. Nonpalpable PT pulse(s) b/l lower extremities. Pedal hair absent b/l. Skin temperature gradient warm to cool b/l. No signs of CLI or gangrene b/l LE.  Dermatological Examination: Pedal skin with normal turgor, texture and tone bilaterally. No open wounds bilaterally. No interdigital macerations bilaterally. Toenails 1-5 b/l elongated, discolored, dystrophic, thickened, crumbly with subungual debris and tenderness to dorsal palpation.  Musculoskeletal: Normal muscle strength 5/5 to all lower extremity muscle groups bilaterally. No pain crepitus or joint limitation noted with ROM b/l. Hallux valgus with bunion deformity noted b/l lower extremities.  Neurological Examination: Protective sensation intact 5/5 intact bilaterally with 10g monofilament b/l.  Assessment: 1. Pain due to onychomycosis of toenails of both feet   2. Type II diabetes mellitus with peripheral circulatory disorder (HCC)   Plan: -Examined patient. -No new findings. No new orders. -Continue diabetic foot care principles. -Toenails 1-5 b/l were debrided in length and girth with sterile nail nippers and dremel without iatrogenic bleeding.  -Patient to report any pedal injuries to medical professional immediately. -Patient to continue soft, supportive shoe gear daily. -Patient/POA to call should there be question/concern in the interim.  Return in about 3 months (around 05/26/2020) for diabetic nail and callus trim.  Freddie Breech, DPM

## 2020-06-03 ENCOUNTER — Ambulatory Visit: Payer: Medicare Other | Admitting: Podiatry

## 2020-07-15 ENCOUNTER — Encounter: Payer: Self-pay | Admitting: Podiatry

## 2020-07-15 ENCOUNTER — Ambulatory Visit (INDEPENDENT_AMBULATORY_CARE_PROVIDER_SITE_OTHER): Payer: Federal, State, Local not specified - PPO | Admitting: Podiatry

## 2020-07-15 ENCOUNTER — Other Ambulatory Visit: Payer: Self-pay

## 2020-07-15 DIAGNOSIS — M79675 Pain in left toe(s): Secondary | ICD-10-CM | POA: Diagnosis not present

## 2020-07-15 DIAGNOSIS — B351 Tinea unguium: Secondary | ICD-10-CM

## 2020-07-15 DIAGNOSIS — L89620 Pressure ulcer of left heel, unstageable: Secondary | ICD-10-CM

## 2020-07-15 DIAGNOSIS — M79674 Pain in right toe(s): Secondary | ICD-10-CM

## 2020-07-15 DIAGNOSIS — E1151 Type 2 diabetes mellitus with diabetic peripheral angiopathy without gangrene: Secondary | ICD-10-CM | POA: Diagnosis not present

## 2020-07-18 NOTE — Progress Notes (Signed)
Subjective: [name] presents today at risk foot care. Pt has h/o NIDDM with PAD and painful mycotic nails b/l that are difficult to trim. Pain interferes with ambulation. Aggravating factors include wearing enclosed shoe gear. Pain is relieved with periodic professional debridement.   She is accompanied by her niece on today's visit. Niece relates Ms. Ryles c/o heel pain. Ms. Venturini is in bed most of the time and mostly on her back during this time.   Alysia Penna, MD is patient's PCP.  Past Medical History:  Diagnosis Date  . Allergy   . Arthritis   . Blindness   . Coronary artery disease   . Diabetes mellitus without complication (HCC)   . Glaucoma   . Hyperlipidemia   . Hypertension   . Myocardial infarction (HCC)   . Peripheral vascular disease (HCC)   . Stroke [facility])      Current Outpatient Medications on File Prior to Visit  Medication Sig Dispense Refill  . acetaminophen (TYLENOL) 325 MG tablet Take 2 tablets (650 mg total) by mouth every 6 (six) hours as needed for mild pain (temp > 101.5).    Marland Kitchen amLODipine (NORVASC) 10 MG tablet Take 1 tablet (10 mg total) by mouth daily. 90 tablet 3  . calcium carbonate (OSCAL) 1500 (600 Ca) MG TABS tablet Take 1,500 mg by mouth daily with breakfast.    . cloNIDine (CATAPRES) 0.2 MG tablet Take 1 tablet (0.2 mg total) by mouth 2 (two) times daily. 60 tablet 6  . clopidogrel (PLAVIX) 75 MG tablet TAKE 1 TABLET (75 MG TOTAL) BY MOUTH DAILY. 90 tablet 2  . ezetimibe (ZETIA) 10 MG tablet Take 1 tablet (10 mg total) by mouth daily. 90 tablet 3  . FREESTYLE LITE test strip CHECK 3 TIMES PER DAY 100 each 3  . gabapentin (NEURONTIN) 300 MG capsule Take 300 mg by mouth daily.    . hydrALAZINE (APRESOLINE) 25 MG tablet Take 1 tablet (25 mg total) by mouth 2 (two) times daily. 60 tablet 0  . insulin glargine (LANTUS) 100 UNIT/ML injection Inject 0-0.1 mLs (0-10 Units total) into the skin at bedtime. Sliding scale (Patient taking differently:  Inject 0-10 Units into the skin See admin instructions. Per Sliding scale) 10 mL 3  . insulin lispro (HUMALOG) 100 UNIT/ML injection INJECT 0.1 MILLILITERS (10 UNITS) INTO THE SKIN 3 TIMES A DAY WITH MEALS (Patient taking differently: Inject 0-10 Units into the skin See admin instructions. Per Sliding Scale 3 TIMES A DAY WITH MEALS) 10 mL 0  . Insulin Syringe-Needle U-100 (BD INSULIN SYRINGE ULTRAFINE) 31G X 15/64" 0.5 ML MISC Take insulin once nightly as needed 90 each 3  . isosorbide mononitrate (IMDUR) 30 MG 24 hr tablet Take 0.5 tablets (15 mg total) by mouth daily. 30 tablet 3  . ketoconazole (NIZORAL) 2 % cream Apply 1 application topically daily. Apply to both feet and between toes once daily for 6 weeks 30 g 1  . Lancets (FREESTYLE) lancets Check 3 times per day for E11.9 100 each 12  . loratadine (CLARITIN) 10 MG tablet Take 10 mg by mouth daily.    Marland Kitchen LUMIGAN 0.01 % SOLN 1 drop at bedtime.    . nitrofurantoin, macrocrystal-monohydrate, (MACROBID) 100 MG capsule nitrofurantoin monohydrate/macrocrystals 100 mg capsule    . pantoprazole (PROTONIX) 40 MG tablet TAKE 1 TABLET BY MOUTH EVERY DAY 30 tablet 6  . polyethylene glycol powder (GLYCOLAX/MIRALAX) powder Take 17 g by mouth daily. 850 g 4  . SIMBRINZA 1-0.2 %  SUSP     . simvastatin (ZOCOR) 20 MG tablet Take 1 tablet (20 mg total) by mouth at bedtime. OFFICE VISIT NEEDED 30 tablet 6  . tamsulosin (FLOMAX) 0.4 MG CAPS capsule tamsulosin 0.4 mg capsule    . Vitamin D, Ergocalciferol, (DRISDOL) 1.25 MG (50000 UNIT) CAPS capsule Take 50,000 Units by mouth once a week.    . VOLTAREN 1 % GEL APPLY 4 GRAMS TOPICALLY 2 TIMES DAILY AS NEEDED. 100 g 0   No current facility-administered medications on file prior to visit.     Allergies  Allergen Reactions  . Milk-Related Compounds Other (See Comments)    Lactose intolerant.      Objective: Ninnie Fein is a pleasant 86 y.o. y.o. Patient Race: Black or African American [2]  female in NAD.  AAO x 3.  There were no vitals filed for this visit.  Vascular Examination: Capillary fill time to digits <3 seconds b/l lower extremities. Nonpalpable DP pulse(s) b/l lower extremities. Nonpalpable PT pulse(s) b/l lower extremities. Pedal hair absent b/l. Skin temperature gradient warm to cool b/l. No signs of CLI or gangrene b/l LE.  Dermatological Examination: Pedal skin with normal turgor, texture and tone bilaterally. No open wounds bilaterally. No interdigital macerations bilaterally. Toenails 1-5 b/l elongated, discolored, dystrophic, thickened, crumbly with subungual debris and tenderness to dorsal palpation. Pain on palpation posterior aspect left heel with mild hyperkeratosis. No blistering, no erythema, no edema, no drainage, no fluctuance. No ischemia/gangrene noted.   Musculoskeletal: Normal muscle strength 5/5 to all lower extremity muscle groups bilaterally. No pain crepitus or joint limitation noted with ROM b/l. Hallux valgus with bunion deformity noted b/l lower extremities.  Neurological Examination: Protective sensation intact 5/5 intact bilaterally with 10g monofilament b/l.  Assessment: 1. Pain due to onychomycosis of toenails of both feet   2. Pressure injury of left heel, unstageable (HCC)   3. Type II diabetes mellitus with peripheral circulatory disorder (HCC)     Plan: -Examined patient. -Educated on floating heels and using heel protectors when Ms. Mcferran is in bed. She may apply Boudreaux's Butt Paste to heels. Also Vaseline Petroleum Jelly for moisturizing. -Continue diabetic foot care principles. -Toenails 1-5 b/l were debrided in length and girth with sterile nail nippers and dremel without iatrogenic bleeding.  -Patient to report any pedal injuries to medical professional immediately. -Patient to continue soft, supportive shoe gear daily. -Patient/POA to call should there be question/concern in the interim.  Return in about 3 months (around 10/15/2020) for  diabetic nail and callus trim.  Freddie Breech, DPM

## 2020-08-03 ENCOUNTER — Other Ambulatory Visit: Payer: Self-pay | Admitting: Cardiovascular Disease

## 2020-08-03 DIAGNOSIS — Z8673 Personal history of transient ischemic attack (TIA), and cerebral infarction without residual deficits: Secondary | ICD-10-CM

## 2020-08-05 ENCOUNTER — Other Ambulatory Visit: Payer: Self-pay | Admitting: Podiatry

## 2020-08-05 NOTE — Telephone Encounter (Signed)
Please advise 

## 2020-08-06 NOTE — Telephone Encounter (Signed)
Refused. No longer needed.

## 2020-08-31 ENCOUNTER — Emergency Department (HOSPITAL_COMMUNITY): Payer: Medicare Other

## 2020-08-31 ENCOUNTER — Encounter (HOSPITAL_COMMUNITY): Payer: Self-pay

## 2020-08-31 ENCOUNTER — Inpatient Hospital Stay (HOSPITAL_COMMUNITY)
Admission: EM | Admit: 2020-08-31 | Discharge: 2020-09-13 | DRG: 177 | Disposition: A | Discharge status: Home Health | Payer: Medicare Other | Attending: Internal Medicine | Admitting: Internal Medicine

## 2020-08-31 DIAGNOSIS — Z794 Long term (current) use of insulin: Secondary | ICD-10-CM

## 2020-08-31 DIAGNOSIS — R531 Weakness: Secondary | ICD-10-CM

## 2020-08-31 DIAGNOSIS — E1151 Type 2 diabetes mellitus with diabetic peripheral angiopathy without gangrene: Secondary | ICD-10-CM | POA: Diagnosis present

## 2020-08-31 DIAGNOSIS — I110 Hypertensive heart disease with heart failure: Secondary | ICD-10-CM | POA: Diagnosis present

## 2020-08-31 DIAGNOSIS — E785 Hyperlipidemia, unspecified: Secondary | ICD-10-CM | POA: Diagnosis present

## 2020-08-31 DIAGNOSIS — Z823 Family history of stroke: Secondary | ICD-10-CM

## 2020-08-31 DIAGNOSIS — I5043 Acute on chronic combined systolic (congestive) and diastolic (congestive) heart failure: Secondary | ICD-10-CM | POA: Diagnosis not present

## 2020-08-31 DIAGNOSIS — Z7902 Long term (current) use of antithrombotics/antiplatelets: Secondary | ICD-10-CM

## 2020-08-31 DIAGNOSIS — F0390 Unspecified dementia without behavioral disturbance: Secondary | ICD-10-CM | POA: Diagnosis present

## 2020-08-31 DIAGNOSIS — T82524A Displacement of infusion catheter, initial encounter: Secondary | ICD-10-CM | POA: Diagnosis not present

## 2020-08-31 DIAGNOSIS — G3184 Mild cognitive impairment, so stated: Secondary | ICD-10-CM | POA: Diagnosis present

## 2020-08-31 DIAGNOSIS — N390 Urinary tract infection, site not specified: Secondary | ICD-10-CM | POA: Diagnosis present

## 2020-08-31 DIAGNOSIS — I10 Essential (primary) hypertension: Secondary | ICD-10-CM | POA: Diagnosis present

## 2020-08-31 DIAGNOSIS — R6520 Severe sepsis without septic shock: Secondary | ICD-10-CM

## 2020-08-31 DIAGNOSIS — U071 COVID-19: Principal | ICD-10-CM | POA: Diagnosis present

## 2020-08-31 DIAGNOSIS — N179 Acute kidney failure, unspecified: Secondary | ICD-10-CM | POA: Diagnosis present

## 2020-08-31 DIAGNOSIS — D72810 Lymphocytopenia: Secondary | ICD-10-CM | POA: Diagnosis present

## 2020-08-31 DIAGNOSIS — Z87891 Personal history of nicotine dependence: Secondary | ICD-10-CM

## 2020-08-31 DIAGNOSIS — J1282 Pneumonia due to coronavirus disease 2019: Secondary | ICD-10-CM | POA: Diagnosis present

## 2020-08-31 DIAGNOSIS — Y712 Prosthetic and other implants, materials and accessory cardiovascular devices associated with adverse incidents: Secondary | ICD-10-CM | POA: Diagnosis not present

## 2020-08-31 DIAGNOSIS — Z79899 Other long term (current) drug therapy: Secondary | ICD-10-CM

## 2020-08-31 DIAGNOSIS — H409 Unspecified glaucoma: Secondary | ICD-10-CM | POA: Diagnosis present

## 2020-08-31 DIAGNOSIS — E876 Hypokalemia: Secondary | ICD-10-CM | POA: Diagnosis not present

## 2020-08-31 DIAGNOSIS — Z8673 Personal history of transient ischemic attack (TIA), and cerebral infarction without residual deficits: Secondary | ICD-10-CM

## 2020-08-31 DIAGNOSIS — J9601 Acute respiratory failure with hypoxia: Secondary | ICD-10-CM | POA: Diagnosis present

## 2020-08-31 DIAGNOSIS — D6959 Other secondary thrombocytopenia: Secondary | ICD-10-CM | POA: Diagnosis present

## 2020-08-31 DIAGNOSIS — Z8249 Family history of ischemic heart disease and other diseases of the circulatory system: Secondary | ICD-10-CM

## 2020-08-31 DIAGNOSIS — A419 Sepsis, unspecified organism: Secondary | ICD-10-CM | POA: Diagnosis not present

## 2020-08-31 DIAGNOSIS — I214 Non-ST elevation (NSTEMI) myocardial infarction: Secondary | ICD-10-CM

## 2020-08-31 DIAGNOSIS — H548 Legal blindness, as defined in USA: Secondary | ICD-10-CM | POA: Diagnosis present

## 2020-08-31 DIAGNOSIS — Z833 Family history of diabetes mellitus: Secondary | ICD-10-CM

## 2020-08-31 DIAGNOSIS — Z7189 Other specified counseling: Secondary | ICD-10-CM

## 2020-08-31 DIAGNOSIS — I7100 Dissection of unspecified site of aorta: Secondary | ICD-10-CM | POA: Diagnosis present

## 2020-08-31 DIAGNOSIS — Z66 Do not resuscitate: Secondary | ICD-10-CM | POA: Diagnosis present

## 2020-08-31 DIAGNOSIS — Z452 Encounter for adjustment and management of vascular access device: Secondary | ICD-10-CM

## 2020-08-31 DIAGNOSIS — I2510 Atherosclerotic heart disease of native coronary artery without angina pectoris: Secondary | ICD-10-CM | POA: Diagnosis present

## 2020-08-31 DIAGNOSIS — J189 Pneumonia, unspecified organism: Secondary | ICD-10-CM

## 2020-08-31 DIAGNOSIS — E1142 Type 2 diabetes mellitus with diabetic polyneuropathy: Secondary | ICD-10-CM | POA: Diagnosis present

## 2020-08-31 DIAGNOSIS — Z91018 Allergy to other foods: Secondary | ICD-10-CM

## 2020-08-31 DIAGNOSIS — I82431 Acute embolism and thrombosis of right popliteal vein: Secondary | ICD-10-CM | POA: Diagnosis present

## 2020-08-31 DIAGNOSIS — Z515 Encounter for palliative care: Secondary | ICD-10-CM

## 2020-08-31 DIAGNOSIS — E1165 Type 2 diabetes mellitus with hyperglycemia: Secondary | ICD-10-CM | POA: Diagnosis present

## 2020-08-31 LAB — URINALYSIS, ROUTINE W REFLEX MICROSCOPIC
Bilirubin Urine: NEGATIVE
Glucose, UA: NEGATIVE mg/dL
Ketones, ur: NEGATIVE mg/dL
Nitrite: NEGATIVE
Protein, ur: 30 mg/dL — AB
Specific Gravity, Urine: 1.018 (ref 1.005–1.030)
pH: 5 (ref 5.0–8.0)

## 2020-08-31 LAB — CBC WITH DIFFERENTIAL/PLATELET
Abs Immature Granulocytes: 0.02 10*3/uL (ref 0.00–0.07)
Basophils Absolute: 0 10*3/uL (ref 0.0–0.1)
Basophils Relative: 0 %
Eosinophils Absolute: 0 10*3/uL (ref 0.0–0.5)
Eosinophils Relative: 0 %
HCT: 51 % — ABNORMAL HIGH (ref 36.0–46.0)
Hemoglobin: 16.5 g/dL — ABNORMAL HIGH (ref 12.0–15.0)
Immature Granulocytes: 0 %
Lymphocytes Relative: 6 %
Lymphs Abs: 0.4 10*3/uL — ABNORMAL LOW (ref 0.7–4.0)
MCH: 31.5 pg (ref 26.0–34.0)
MCHC: 32.4 g/dL (ref 30.0–36.0)
MCV: 97.3 fL (ref 80.0–100.0)
Monocytes Absolute: 0.3 10*3/uL (ref 0.1–1.0)
Monocytes Relative: 4 %
Neutro Abs: 6 10*3/uL (ref 1.7–7.7)
Neutrophils Relative %: 90 %
Platelets: 179 10*3/uL (ref 150–400)
RBC: 5.24 MIL/uL — ABNORMAL HIGH (ref 3.87–5.11)
RDW: 13 % (ref 11.5–15.5)
WBC: 6.6 10*3/uL (ref 4.0–10.5)
nRBC: 0 % (ref 0.0–0.2)

## 2020-08-31 LAB — COMPREHENSIVE METABOLIC PANEL
ALT: 23 U/L (ref 0–44)
AST: 51 U/L — ABNORMAL HIGH (ref 15–41)
Albumin: 3.8 g/dL (ref 3.5–5.0)
Alkaline Phosphatase: 66 U/L (ref 38–126)
Anion gap: 12 (ref 5–15)
BUN: 31 mg/dL — ABNORMAL HIGH (ref 8–23)
CO2: 24 mmol/L (ref 22–32)
Calcium: 9 mg/dL (ref 8.9–10.3)
Chloride: 103 mmol/L (ref 98–111)
Creatinine, Ser: 1.05 mg/dL — ABNORMAL HIGH (ref 0.44–1.00)
GFR, Estimated: 51 mL/min — ABNORMAL LOW (ref >60)
Glucose, Bld: 114 mg/dL — ABNORMAL HIGH (ref 70–99)
Potassium: 4.4 mmol/L (ref 3.5–5.1)
Sodium: 139 mmol/L (ref 135–145)
Total Bilirubin: 1.6 mg/dL — ABNORMAL HIGH (ref 0.3–1.2)
Total Protein: 8 g/dL (ref 6.5–8.1)

## 2020-08-31 LAB — RESP PANEL BY RT-PCR (FLU A&B, COVID) ARPGX2
Influenza A by PCR: NEGATIVE
Influenza B by PCR: NEGATIVE
SARS Coronavirus 2 by RT PCR: POSITIVE — AB

## 2020-08-31 LAB — PROTIME-INR
INR: 1 (ref 0.8–1.2)
Prothrombin Time: 12.7 seconds (ref 11.4–15.2)

## 2020-08-31 LAB — APTT: aPTT: 27 seconds (ref 24–36)

## 2020-08-31 LAB — LACTIC ACID, PLASMA: Lactic Acid, Venous: 2 mmol/L (ref 0.5–1.9)

## 2020-08-31 MED ORDER — LACTATED RINGERS IV BOLUS
1000.0000 mL | Freq: Once | INTRAVENOUS | Status: AC
  Administered 2020-08-31: 1000 mL via INTRAVENOUS

## 2020-08-31 MED ORDER — SODIUM CHLORIDE 0.9 % IV SOLN
1.0000 g | INTRAVENOUS | Status: AC
  Administered 2020-08-31 – 2020-09-02 (×3): 1 g via INTRAVENOUS
  Filled 2020-08-31 (×2): qty 1
  Filled 2020-08-31: qty 10

## 2020-08-31 MED ORDER — ACETAMINOPHEN 325 MG PO TABS
650.0000 mg | ORAL_TABLET | Freq: Once | ORAL | Status: AC
  Administered 2020-08-31: 650 mg via ORAL
  Filled 2020-08-31: qty 2

## 2020-08-31 NOTE — ED Provider Notes (Addendum)
[facility]-EMERGENCY DEPT Provider Note   CSN: [id number] Arrival date & time: 08/31/20  [id number]  LEVEL 5 CAVEAT - ALTERED MENTAL STATUS  History Chief Complaint  Patient presents with  . Weakness    Kaitlin Smith is a 86 y.o. female.  HPI 86 year old female presents with generalized weakness.  History is from the nurses spoke to EMS as well as the niece and caretaker who takes care of her at home.  The caretaker was diagnosed with Covid about a week ago.  The patient has been feeling generally weak and weaker than typical for about 2 days.  Unable to get off the toilet by herself.  Patient has complained of some back pain but this is pretty typical for her.  No known fever at home.  No cough or vomiting.  Daughter is concerned she might have a UTI.   Past Medical History:  Diagnosis Date  . Allergy   . Arthritis   . Blindness   . Coronary artery disease   . Diabetes mellitus without complication (HCC)   . Glaucoma   . Hyperlipidemia   . Hypertension   . Myocardial infarction (HCC)   . Peripheral vascular disease (HCC)   . Stroke [facility](HCC)     Patient Active Problem List   Diagnosis Date Noted  . Aortic dissection (HCC) 08/17/2018  . Hypertensive emergency 08/17/2018  . Abnormal EKG 01/30/2018  . Osteoarthritis of right knee 12/04/2017  . MCI (mild cognitive impairment) 06/30/2015  . Essential hypertension 08/20/2014  . Hyperlipidemia 08/20/2014  . Peripheral arterial disease (HCC) 08/20/2014  . Carotid artery disease (HCC) 06/06/2014  . Uncontrolled diabetes mellitus type 2 without complications 05/13/2014  . History of CVA (cerebrovascular accident) 05/13/2014  . History of MI (myocardial infarction) 05/13/2014    Past Surgical History:  Procedure Laterality Date  . EYE SURGERY       OB History   No obstetric history on file.     Family History  Problem Relation Age of Onset  . Diabetes Mother   . Stroke Mother   . Peripheral vascular  disease Mother        amputation  . Cancer Brother   . Diabetes Brother   . Alcohol abuse Brother   . Varicose Veins Brother   . Hypertension Father   . Varicose Veins Father   . Heart attack Father   . Cancer Father   . Diabetes Sister   . Hypertension Sister     Social History   Tobacco Use  . Smoking status: Former Smoker    Types: Cigarettes    Quit date: 06/06/2002    Years since quitting: 18.2  . Smokeless tobacco: Never Used  . Tobacco comment: Quit at age 86  Substance Use Topics  . Alcohol use: No    Alcohol/week: 0.0 standard drinks  . Drug use: No    Home Medications Prior to Admission medications   Medication Sig Start Date End Date Taking? Authorizing Provider  acetaminophen (TYLENOL) 325 MG tablet Take 2 tablets (650 mg total) by mouth every 6 (six) hours as needed for mild pain (temp > 101.5). 08/22/18   Elgergawy, Leana Roeawood S, MD  amLODipine (NORVASC) 10 MG tablet Take 1 tablet (10 mg total) by mouth daily. 07/23/19   Runell GessBerry, Jonathan J, MD  calcium carbonate (OSCAL) 1500 (600 Ca) MG TABS tablet Take 1,500 mg by mouth daily with breakfast.    [provider]  cloNIDine (CATAPRES) 0.2 MG tablet Take 1 tablet (  0.2 mg total) by mouth 2 (two) times daily. 08/22/18   Elgergawy, Leana Roe, MD  clopidogrel (PLAVIX) 75 MG tablet TAKE 1 TABLET (75 MG TOTAL) BY MOUTH DAILY. 10/21/19   Runell Gess, MD  ezetimibe (ZETIA) 10 MG tablet Take 1 tablet (10 mg total) by mouth daily. 07/09/19   Runell Gess, MD  FREESTYLE LITE test strip CHECK 3 TIMES PER DAY 05/17/16   Quentin Angst, MD  gabapentin (NEURONTIN) 300 MG capsule Take 300 mg by mouth daily. 11/19/19   [provider]  hydrALAZINE (APRESOLINE) 25 MG tablet Take 1 tablet (25 mg total) by mouth 2 (two) times daily. 08/22/18   Elgergawy, Leana Roe, MD  insulin glargine (LANTUS) 100 UNIT/ML injection Inject 0-0.1 mLs (0-10 Units total) into the skin at bedtime. Sliding scale Patient taking  differently: Inject 0-10 Units into the skin See admin instructions. Per Sliding scale 11/12/15   [name], NP  insulin lispro (HUMALOG) 100 UNIT/ML injection INJECT 0.1 MILLILITERS (10 UNITS) INTO THE SKIN 3 TIMES A DAY WITH MEALS Patient taking differently: Inject 0-10 Units into the skin See admin instructions. Per Sliding Scale 3 TIMES A DAY WITH MEALS 10/29/16   Weber, Dema Severin, PA-C  Insulin Syringe-Needle U-100 (BD INSULIN SYRINGE ULTRAFINE) 31G X 15/64" 0.5 ML MISC Take insulin once nightly as needed 06/10/15   [name], NP  isosorbide mononitrate (IMDUR) 30 MG 24 hr tablet Take 0.5 tablets (15 mg total) by mouth daily. 01/30/18   Runell Gess, MD  ketoconazole (NIZORAL) 2 % cream Apply 1 application topically daily. Apply to both feet and between toes once daily for 6 weeks 09/03/18   Freddie Breech, DPM  Lancets (FREESTYLE) lancets Check 3 times per day for E11.9 01/16/15   [name], NP  loratadine (CLARITIN) 10 MG tablet Take 10 mg by mouth daily.    [provider]  LUMIGAN 0.01 % SOLN 1 drop at bedtime. 09/10/19   [provider]  nitrofurantoin, macrocrystal-monohydrate, (MACROBID) 100 MG capsule nitrofurantoin monohydrate/macrocrystals 100 mg capsule    [provider]  pantoprazole (PROTONIX) 40 MG tablet TAKE 1 TABLET BY MOUTH EVERY DAY 11/15/19   Runell Gess, MD  polyethylene glycol powder (GLYCOLAX/MIRALAX) powder Take 17 g by mouth daily. 01/16/15   [name], NP  SIMBRINZA 1-0.2 % SUSP  02/13/20   [provider]  simvastatin (ZOCOR) 20 MG tablet TAKE 1 TABLET (20 MG TOTAL) BY MOUTH AT BEDTIME. OFFICE VISIT NEEDED 08/03/20   Runell Gess, MD  tamsulosin (FLOMAX) 0.4 MG CAPS capsule tamsulosin 0.4 mg capsule    [provider]  Vitamin D, Ergocalciferol, (DRISDOL) 1.25 MG (50000 UNIT) CAPS capsule Take 50,000 Units by mouth once a week. 11/01/19   [provider]  VOLTAREN 1 % GEL APPLY 4  GRAMS TOPICALLY 2 TIMES DAILY AS NEEDED. 04/25/16   Runell Gess, MD    Allergies    Milk-related compounds  Review of Systems   Review of Systems  Constitutional: Positive for fatigue. Negative for fever.  Respiratory: Negative for cough.   Gastrointestinal: Negative for vomiting.  Neurological: Positive for weakness.  All other systems reviewed and are negative.   Physical Exam Updated Vital Signs BP (!) 151/73   Pulse 80   Temp (!) 101.7 F (38.7 C) (Rectal)   Resp (!) 26   Ht 5\' 5"  (1.651 m)   Wt 62.6 kg   SpO2 90%  BMI 22.97 kg/m   Physical Exam Vitals and nursing note reviewed.  Constitutional:      General: She is not in acute distress.    Appearance: She is well-developed and well-nourished. She is not diaphoretic.  HENT:     Head: Normocephalic and atraumatic.     Right Ear: External ear normal.     Left Ear: External ear normal.     Nose: Nose normal.  Eyes:     General:        Right eye: No discharge.        Left eye: No discharge.  Cardiovascular:     Rate and Rhythm: Normal rate and regular rhythm.     Heart sounds: Normal heart sounds.  Pulmonary:     Effort: Pulmonary effort is normal.     Breath sounds: Normal breath sounds.  Abdominal:     General: There is no distension.     Palpations: Abdomen is soft.     Tenderness: There is no abdominal tenderness.  Skin:    General: Skin is warm and dry.  Neurological:     Mental Status: She is alert.     Comments: Awake, alert, oriented to person and place. Disoriented to time. Equal strength in all 4 extremities  Psychiatric:        Mood and Affect: Mood is not anxious.     ED Results / Procedures / Treatments   Labs (all labs ordered are listed, but only abnormal results are displayed) Labs Reviewed  LACTIC ACID, PLASMA - Abnormal; Notable for the following components:      Result Value   Lactic Acid, Venous 2.0 (*)    All other components within normal limits  COMPREHENSIVE METABOLIC  PANEL - Abnormal; Notable for the following components:   Glucose, Bld 114 (*)    BUN 31 (*)    Creatinine, Ser 1.05 (*)    AST 51 (*)    Total Bilirubin 1.6 (*)    GFR, Estimated 51 (*)    All other components within normal limits  CBC WITH DIFFERENTIAL/PLATELET - Abnormal; Notable for the following components:   RBC 5.24 (*)    Hemoglobin 16.5 (*)    HCT 51.0 (*)    Lymphs Abs 0.4 (*)    All other components within normal limits  URINE CULTURE  CULTURE, BLOOD (ROUTINE X 2)  CULTURE, BLOOD (ROUTINE X 2)  RESP PANEL BY RT-PCR (FLU A&B, COVID) ARPGX2  PROTIME-INR  APTT  LACTIC ACID, PLASMA  URINALYSIS, ROUTINE W REFLEX MICROSCOPIC    EKG EKG Interpretation  Date/Time:  Monday August 31 2020 19:36:03 EST Ventricular Rate:  88 PR Interval:    QRS Duration: 95 QT Interval:  333 QTC Calculation: 403 R Axis:   -18 Text Interpretation: Sinus rhythm Borderline left axis deviation Borderline T abnormalities, lateral leads Confirmed by Pricilla Loveless [phone]) on 08/31/2020 8:00:30 PM   Radiology CT Head Wo Contrast  Result Date: 08/31/2020 CLINICAL DATA:  Altered mental status. EXAM: CT HEAD WITHOUT CONTRAST TECHNIQUE: Contiguous axial images were obtained from the base of the skull through the vertex without intravenous contrast. COMPARISON:  None. FINDINGS: Brain: Age related atrophy. Remote right frontal and periventricular infarct with ex vacuo dilatation of the right lateral ventricle. Small remote right occipital infarct. Moderate periventricular and deep white matter hypodensity typical of chronic small vessel ischemia. No hemorrhage or evidence of acute ischemia. No subdural or extra-axial collection. No midline shift or mass effect. Vascular: Atherosclerosis of skullbase  vasculature without hyperdense vessel or abnormal calcification. Skull: No fracture or focal lesion. Sinuses/Orbits: Paranasal sinuses and mastoid air cells are clear. The visualized orbits are unremarkable.  Right cataract resection. Other: None. IMPRESSION: 1. No acute intracranial abnormality. 2. Age related atrophy and chronic small vessel ischemia. Remote right frontal and periventricular infarcts. Small remote right occipital infarct. Electronically Signed   By: Narda Rutherford M.D.   On: 08/31/2020 21:17   DG Chest Port 1 View  Result Date: 08/31/2020 CLINICAL DATA:  Questionable sepsis EXAM: PORTABLE CHEST 1 VIEW COMPARISON:  05/19/2017 FINDINGS: Heart is normal size. Aortic calcifications. No confluent opacities or effusions. No acute bony abnormality. IMPRESSION: No active disease. Electronically Signed   By: Charlett Nose M.D.   On: 08/31/2020 20:31    Procedures .Critical Care Performed by: Pricilla Loveless, MD Authorized by: Pricilla Loveless, MD   Critical care provider statement:    Critical care time (minutes):  35   Critical care time was exclusive of:  Separately billable procedures and treating other patients   Critical care was necessary to treat or prevent imminent or life-threatening deterioration of the following conditions:  Sepsis   Critical care was time spent personally by me on the following activities:  Discussions with consultants, evaluation of patient's response to treatment, examination of patient, ordering and performing treatments and interventions, ordering and review of laboratory studies, ordering and review of radiographic studies, pulse oximetry, re-evaluation of patient's condition, obtaining history from patient or surrogate and review of old charts   (including critical care time)  Medications Ordered in ED Medications  cefTRIAXone (ROCEPHIN) 1 g in sodium chloride 0.9 % 100 mL IVPB (0 g Intravenous Stopped 08/31/20 2106)  acetaminophen (TYLENOL) tablet 650 mg (650 mg Oral Given 08/31/20 2008)  lactated ringers bolus 1,000 mL (0 mLs Intravenous Stopped 08/31/20 2227)    ED Course  I have reviewed the triage vital signs and the nursing  notes.  Pertinent labs & imaging results that were available during my care of the patient were reviewed by me and considered in my medical decision making (see chart for details).    MDM Rules/Calculators/A&P                          Patient probably has a urinary tract infection.  With recent COVID diagnosis in the family, will obviously test for Covid as well.  She is nondistressed but she is tachypneic and mild tachycardia with heart rate in the 90s along with fever so she meets criteria for sepsis.  Lactate is 2.0.  Given IV Rocephin.  Care to Dr. Judd Lien with urine and COVID pending but she will need admission.  11:33 PM Addendum: Covid test has returned positive. Likely the source of fever. She was too weak to stand at home, thus will admit for supportive care.  Final Clinical Impression(s) / ED Diagnoses Final diagnoses:  Severe sepsis [facility])    Rx / DC Orders ED Discharge Orders    None       Pricilla Loveless, MD 08/31/20 [id number]    Pricilla Loveless, MD 08/31/20 [id number]

## 2020-08-31 NOTE — ED Triage Notes (Signed)
Pt arrives via EMS after daughter called for assistance getting mother up from recliner. Pt was found at home on her recliner slumped over. Daughter  (who tested positive for covid) called EMS because she has become too weak to care for her mother. She reports her mother was stuck in the recliner for around 12 hours. EMS was able to get patient to toilet, and a foul odor was noted to her urine. Family reports hx of possible dementia, pt is slightly altered at baseline. Pt reports weakness x 2 days,  right shoulder pain, and upper back pain.   EMS vitals: T 99  BP 144/68 P 98 RA 99 % CBG 136

## 2020-08-31 NOTE — Progress Notes (Signed)
BC were drawn before administration of antibiotics.

## 2020-08-31 NOTE — Progress Notes (Signed)
Following for Code Sepsis  

## 2020-08-31 NOTE — ED Notes (Signed)
BC x1 collected

## 2020-09-01 ENCOUNTER — Encounter (HOSPITAL_COMMUNITY): Payer: Self-pay | Admitting: Internal Medicine

## 2020-09-01 DIAGNOSIS — I2510 Atherosclerotic heart disease of native coronary artery without angina pectoris: Secondary | ICD-10-CM | POA: Diagnosis present

## 2020-09-01 DIAGNOSIS — R531 Weakness: Secondary | ICD-10-CM | POA: Diagnosis not present

## 2020-09-01 DIAGNOSIS — J1282 Pneumonia due to coronavirus disease 2019: Secondary | ICD-10-CM | POA: Diagnosis present

## 2020-09-01 DIAGNOSIS — Z66 Do not resuscitate: Secondary | ICD-10-CM | POA: Diagnosis present

## 2020-09-01 DIAGNOSIS — I7101 Dissection of thoracic aorta: Secondary | ICD-10-CM | POA: Diagnosis not present

## 2020-09-01 DIAGNOSIS — H548 Legal blindness, as defined in USA: Secondary | ICD-10-CM | POA: Diagnosis present

## 2020-09-01 DIAGNOSIS — T82524A Displacement of infusion catheter, initial encounter: Secondary | ICD-10-CM | POA: Diagnosis not present

## 2020-09-01 DIAGNOSIS — I214 Non-ST elevation (NSTEMI) myocardial infarction: Secondary | ICD-10-CM | POA: Diagnosis not present

## 2020-09-01 DIAGNOSIS — I7100 Dissection of unspecified site of aorta: Secondary | ICD-10-CM | POA: Diagnosis present

## 2020-09-01 DIAGNOSIS — E1151 Type 2 diabetes mellitus with diabetic peripheral angiopathy without gangrene: Secondary | ICD-10-CM | POA: Diagnosis present

## 2020-09-01 DIAGNOSIS — A419 Sepsis, unspecified organism: Secondary | ICD-10-CM | POA: Diagnosis not present

## 2020-09-01 DIAGNOSIS — D6959 Other secondary thrombocytopenia: Secondary | ICD-10-CM | POA: Diagnosis present

## 2020-09-01 DIAGNOSIS — Z515 Encounter for palliative care: Secondary | ICD-10-CM | POA: Diagnosis not present

## 2020-09-01 DIAGNOSIS — E1165 Type 2 diabetes mellitus with hyperglycemia: Secondary | ICD-10-CM | POA: Diagnosis present

## 2020-09-01 DIAGNOSIS — N179 Acute kidney failure, unspecified: Secondary | ICD-10-CM | POA: Diagnosis present

## 2020-09-01 DIAGNOSIS — I5181 Takotsubo syndrome: Secondary | ICD-10-CM | POA: Diagnosis not present

## 2020-09-01 DIAGNOSIS — U071 COVID-19: Secondary | ICD-10-CM | POA: Diagnosis present

## 2020-09-01 DIAGNOSIS — N390 Urinary tract infection, site not specified: Secondary | ICD-10-CM | POA: Diagnosis present

## 2020-09-01 DIAGNOSIS — I5043 Acute on chronic combined systolic (congestive) and diastolic (congestive) heart failure: Secondary | ICD-10-CM | POA: Diagnosis not present

## 2020-09-01 DIAGNOSIS — E1142 Type 2 diabetes mellitus with diabetic polyneuropathy: Secondary | ICD-10-CM | POA: Diagnosis present

## 2020-09-01 DIAGNOSIS — I10 Essential (primary) hypertension: Secondary | ICD-10-CM | POA: Diagnosis not present

## 2020-09-01 DIAGNOSIS — Y712 Prosthetic and other implants, materials and accessory cardiovascular devices associated with adverse incidents: Secondary | ICD-10-CM | POA: Diagnosis not present

## 2020-09-01 DIAGNOSIS — E785 Hyperlipidemia, unspecified: Secondary | ICD-10-CM | POA: Diagnosis present

## 2020-09-01 DIAGNOSIS — D72810 Lymphocytopenia: Secondary | ICD-10-CM | POA: Diagnosis present

## 2020-09-01 DIAGNOSIS — H409 Unspecified glaucoma: Secondary | ICD-10-CM | POA: Diagnosis present

## 2020-09-01 DIAGNOSIS — I82431 Acute embolism and thrombosis of right popliteal vein: Secondary | ICD-10-CM | POA: Diagnosis present

## 2020-09-01 DIAGNOSIS — Z7189 Other specified counseling: Secondary | ICD-10-CM | POA: Diagnosis not present

## 2020-09-01 DIAGNOSIS — Z8673 Personal history of transient ischemic attack (TIA), and cerebral infarction without residual deficits: Secondary | ICD-10-CM | POA: Diagnosis not present

## 2020-09-01 DIAGNOSIS — R7989 Other specified abnormal findings of blood chemistry: Secondary | ICD-10-CM | POA: Diagnosis not present

## 2020-09-01 DIAGNOSIS — F0390 Unspecified dementia without behavioral disturbance: Secondary | ICD-10-CM | POA: Diagnosis present

## 2020-09-01 DIAGNOSIS — I110 Hypertensive heart disease with heart failure: Secondary | ICD-10-CM | POA: Diagnosis present

## 2020-09-01 DIAGNOSIS — J9601 Acute respiratory failure with hypoxia: Secondary | ICD-10-CM | POA: Diagnosis present

## 2020-09-01 LAB — GLUCOSE, CAPILLARY
Glucose-Capillary: 73 mg/dL (ref 70–99)
Glucose-Capillary: 87 mg/dL (ref 70–99)
Glucose-Capillary: 81 mg/dL (ref 70–99)

## 2020-09-01 LAB — CBC
HCT: 45.1 % (ref 36.0–46.0)
Hemoglobin: 14.4 g/dL (ref 12.0–15.0)
MCH: 31 pg (ref 26.0–34.0)
MCHC: 31.9 g/dL (ref 30.0–36.0)
MCV: 97 fL (ref 80.0–100.0)
Platelets: 141 10*3/uL — ABNORMAL LOW (ref 150–400)
RBC: 4.65 MIL/uL (ref 3.87–5.11)
RDW: 13 % (ref 11.5–15.5)
WBC: 4.8 10*3/uL (ref 4.0–10.5)
nRBC: 0 % (ref 0.0–0.2)

## 2020-09-01 LAB — LACTIC ACID, PLASMA: Lactic Acid, Venous: 1.9 mmol/L (ref 0.5–1.9)

## 2020-09-01 LAB — URINE CULTURE: Culture: NO GROWTH

## 2020-09-01 LAB — CREATININE, SERUM
Creatinine, Ser: 0.67 mg/dL (ref 0.44–1.00)
GFR, Estimated: >60 mL/min (ref >60)

## 2020-09-01 LAB — HEMOGLOBIN A1C
Hgb A1c MFr Bld: 4.8 % (ref 4.8–5.6)
Mean Plasma Glucose: 91.06 mg/dL

## 2020-09-01 LAB — CBG MONITORING, ED: Glucose-Capillary: 81 mg/dL (ref 70–99)

## 2020-09-01 MED ORDER — SODIUM CHLORIDE 0.9 % IV SOLN: INTRAVENOUS | Status: AC

## 2020-09-01 MED ORDER — ENOXAPARIN SODIUM 40 MG/0.4ML ~~LOC~~ SOLN
40.0000 mg | SUBCUTANEOUS | Status: DC
  Administered 2020-09-01 – 2020-09-02 (×2): 40 mg via SUBCUTANEOUS
  Filled 2020-09-01 (×2): qty 0.4

## 2020-09-01 MED ORDER — ACETAMINOPHEN 325 MG PO TABS
650.0000 mg | ORAL_TABLET | Freq: Four times a day (QID) | ORAL | Status: DC | PRN
  Administered 2020-09-01 – 2020-09-12 (×14): 650 mg via ORAL
  Filled 2020-09-01 (×15): qty 2

## 2020-09-01 MED ORDER — LABETALOL HCL 5 MG/ML IV SOLN
10.0000 mg | INTRAVENOUS | Status: DC | PRN
  Administered 2020-09-07 (×2): 10 mg via INTRAVENOUS
  Filled 2020-09-01 (×2): qty 4

## 2020-09-01 MED ORDER — INSULIN ASPART 100 UNIT/ML ~~LOC~~ SOLN
0.0000 [IU] | Freq: Three times a day (TID) | SUBCUTANEOUS | Status: DC
  Filled 2020-09-01: qty 0.09

## 2020-09-01 MED ORDER — ACETAMINOPHEN 650 MG RE SUPP: 650.0000 mg | Freq: Four times a day (QID) | RECTAL | Status: DC | PRN

## 2020-09-01 NOTE — H&P (Signed)
History and Physical    Kaitlin Smith ZOX:[id number] DOB: 14-Nov-1929 DOA: 08/31/2020  PCP: Alysia Penna, MD  Patient coming from: Home.  History obtained from ER physician as patient has dementia unable to reach patient's niece with whom patient lives.  Chief Complaint: Weakness.  HPI: Kaitlin Smith is a 86 y.o. female with history of dementia, diabetes mellitus type 2, hypertension stroke peripheral vascular disease abdominal aortic aneurysm was found to be weak unable to get up from a recliner over the last 12 hours and was brought to the ER.  Patient's family member has been tested positive for Covid infection as per the report.  Patient unable to provide a good history but denies any chest pain or shortness of breath abdominal pain nausea vomiting or diarrhea.  ED Course: In the ER patient was febrile with temperature 103 F tachycardic with elevated lactic acid which improved with fluids.  Patient's UA is consistent with UTI and patient also tested positive for Covid infection.  Patient was not hypoxic chest x-ray does not show any infiltrates.  Patient admitted for further observation.  Started on ceftriaxone for UTI.  CT of the head is unremarkable.  Review of Systems: As per HPI, rest all negative.   Past Medical History:  Diagnosis Date  . Allergy   . Arthritis   . Blindness   . Coronary artery disease   . Diabetes mellitus without complication (HCC)   . Glaucoma   . Hyperlipidemia   . Hypertension   . Myocardial infarction (HCC)   . Peripheral vascular disease (HCC)   . Stroke [facility])     Past Surgical History:  Procedure Laterality Date  . EYE SURGERY       reports that she quit smoking about 18 years ago. Her smoking use included cigarettes. She has never used smokeless tobacco. She reports that she does not drink alcohol and does not use drugs.  Allergies  Allergen Reactions  . Milk-Related Compounds Other (See Comments)    Lactose intolerant.      Family  History  Problem Relation Age of Onset  . Diabetes Mother   . Stroke Mother   . Peripheral vascular disease Mother        amputation  . Cancer Brother   . Diabetes Brother   . Alcohol abuse Brother   . Varicose Veins Brother   . Hypertension Father   . Varicose Veins Father   . Heart attack Father   . Cancer Father   . Diabetes Sister   . Hypertension Sister     Prior to Admission medications   Medication Sig Start Date End Date Taking? Authorizing Provider  acetaminophen (TYLENOL) 325 MG tablet Take 2 tablets (650 mg total) by mouth every 6 (six) hours as needed for mild pain (temp > 101.5). 08/22/18   Elgergawy, Leana Roe, MD  amLODipine (NORVASC) 10 MG tablet Take 1 tablet (10 mg total) by mouth daily. 07/23/19   Runell Gess, MD  calcium carbonate (OSCAL) 1500 (600 Ca) MG TABS tablet Take 1,500 mg by mouth daily with breakfast.    [provider]  cloNIDine (CATAPRES) 0.2 MG tablet Take 1 tablet (0.2 mg total) by mouth 2 (two) times daily. 08/22/18   Elgergawy, Leana Roe, MD  clopidogrel (PLAVIX) 75 MG tablet TAKE 1 TABLET (75 MG TOTAL) BY MOUTH DAILY. 10/21/19   Runell Gess, MD  ezetimibe (ZETIA) 10 MG tablet Take 1 tablet (10 mg total) by mouth daily. 07/09/19   Allyson Sabal,  Delton See, MD  FREESTYLE LITE test strip CHECK 3 TIMES PER DAY 05/17/16   Quentin Angst, MD  gabapentin (NEURONTIN) 300 MG capsule Take 300 mg by mouth daily. 11/19/19   [provider]  hydrALAZINE (APRESOLINE) 25 MG tablet Take 1 tablet (25 mg total) by mouth 2 (two) times daily. 08/22/18   Elgergawy, Leana Roe, MD  insulin glargine (LANTUS) 100 UNIT/ML injection Inject 0-0.1 mLs (0-10 Units total) into the skin at bedtime. Sliding scale Patient taking differently: Inject 0-10 Units into the skin See admin instructions. Per Sliding scale 11/12/15   [name], NP  insulin lispro (HUMALOG) 100 UNIT/ML injection INJECT 0.1 MILLILITERS (10 UNITS) INTO THE SKIN 3 TIMES A DAY WITH  MEALS Patient taking differently: Inject 0-10 Units into the skin See admin instructions. Per Sliding Scale 3 TIMES A DAY WITH MEALS 10/29/16   Weber, Dema Severin, PA-C  Insulin Syringe-Needle U-100 (BD INSULIN SYRINGE ULTRAFINE) 31G X 15/64" 0.5 ML MISC Take insulin once nightly as needed 06/10/15   [name], NP  isosorbide mononitrate (IMDUR) 30 MG 24 hr tablet Take 0.5 tablets (15 mg total) by mouth daily. 01/30/18   Runell Gess, MD  ketoconazole (NIZORAL) 2 % cream Apply 1 application topically daily. Apply to both feet and between toes once daily for 6 weeks 09/03/18   Freddie Breech, DPM  Lancets (FREESTYLE) lancets Check 3 times per day for E11.9 01/16/15   [name], NP  loratadine (CLARITIN) 10 MG tablet Take 10 mg by mouth daily.    [provider]  LUMIGAN 0.01 % SOLN 1 drop at bedtime. 09/10/19   [provider]  nitrofurantoin, macrocrystal-monohydrate, (MACROBID) 100 MG capsule nitrofurantoin monohydrate/macrocrystals 100 mg capsule    [provider]  pantoprazole (PROTONIX) 40 MG tablet TAKE 1 TABLET BY MOUTH EVERY DAY 11/15/19   Runell Gess, MD  polyethylene glycol powder (GLYCOLAX/MIRALAX) powder Take 17 g by mouth daily. 01/16/15   [name], NP  SIMBRINZA 1-0.2 % SUSP  02/13/20   [provider]  simvastatin (ZOCOR) 20 MG tablet TAKE 1 TABLET (20 MG TOTAL) BY MOUTH AT BEDTIME. OFFICE VISIT NEEDED 08/03/20   Runell Gess, MD  tamsulosin (FLOMAX) 0.4 MG CAPS capsule tamsulosin 0.4 mg capsule    [provider]  Vitamin D, Ergocalciferol, (DRISDOL) 1.25 MG (50000 UNIT) CAPS capsule Take 50,000 Units by mouth once a week. 11/01/19   [provider]  VOLTAREN 1 % GEL APPLY 4 GRAMS TOPICALLY 2 TIMES DAILY AS NEEDED. 04/25/16   Runell Gess, MD    Physical Exam: Constitutional: Moderately built and nourished. Vitals:   09/01/20 0200 09/01/20 0230 09/01/20 0300 09/01/20 0330  BP: (!) 168/68 (!)  168/76 (!) 162/69 (!) 159/72  Pulse: 87 84 85 86  Resp: (!) 25 (!) 27 (!) 28 (!) 26  Temp:      TempSrc:      SpO2: 91% 91% 91% 91%  Weight:      Height:       Eyes: Anicteric no pallor. ENMT: No discharge from the ears eyes nose or mouth. Neck: No mass felt.  No neck rigidity. Respiratory: No rhonchi or crepitations. Cardiovascular: S1-S2 heard. Abdomen: Soft nontender bowel sounds present. Musculoskeletal: No edema. Skin: No rash. Neurologic: Alert awake oriented to  place and person.  Moves all extremities. Psychiatric: Oriented to name and place.   Labs on Admission: I have personally reviewed following labs and imaging  studies  CBC: Recent Labs  Lab 08/31/20 2010  WBC 6.6  NEUTROABS 6.0  HGB 16.5*  HCT 51.0*  MCV 97.3  PLT 179   Basic Metabolic Panel: Recent Labs  Lab 08/31/20 2010  NA 139  K 4.4  CL 103  CO2 24  GLUCOSE 114*  BUN 31*  CREATININE 1.05*  CALCIUM 9.0   GFR: Estimated Creatinine Clearance: 33.3 mL/min (A) (by C-G formula based on SCr of 1.05 mg/dL (H)). Liver Function Tests: Recent Labs  Lab 08/31/20 2010  AST 51*  ALT 23  ALKPHOS 66  BILITOT 1.6*  PROT 8.0  ALBUMIN 3.8   No results for input(s): LIPASE, AMYLASE in the last 168 hours. No results for input(s): AMMONIA in the last 168 hours. Coagulation Profile: Recent Labs  Lab 08/31/20 2010  INR 1.0   Cardiac Enzymes: No results for input(s): CKTOTAL, CKMB, CKMBINDEX, TROPONINI in the last 168 hours. BNP (last 3 results) No results for input(s): PROBNP in the last 8760 hours. HbA1C: No results for input(s): HGBA1C in the last 72 hours. CBG: No results for input(s): GLUCAP in the last 168 hours. Lipid Profile: No results for input(s): CHOL, HDL, LDLCALC, TRIG, CHOLHDL, LDLDIRECT in the last 72 hours. Thyroid Function Tests: No results for input(s): TSH, T4TOTAL, FREET4, T3FREE, THYROIDAB in the last 72 hours. Anemia Panel: No results for input(s): VITAMINB12, FOLATE,  FERRITIN, TIBC, IRON, RETICCTPCT in the last 72 hours. Urine analysis:    Component Value Date/Time   COLORURINE AMBER (A) 08/31/2020 2252   APPEARANCEUR CLEAR 08/31/2020 2252   LABSPEC 1.018 08/31/2020 2252   PHURINE 5.0 08/31/2020 2252   GLUCOSEU NEGATIVE 08/31/2020 2252   HGBUR SMALL (A) 08/31/2020 2252   BILIRUBINUR NEGATIVE 08/31/2020 2252   BILIRUBINUR neg 07/18/2014 1320   KETONESUR NEGATIVE 08/31/2020 2252   PROTEINUR 30 (A) 08/31/2020 2252   UROBILINOGEN 0.2 07/18/2014 1320   UROBILINOGEN 0.2 06/14/2014 1236   NITRITE NEGATIVE 08/31/2020 2252   LEUKOCYTESUR MODERATE (A) 08/31/2020 2252   Sepsis Labs: @LABRCNTIP (procalcitonin:4,lacticidven:4) ) Recent Results (from the past 240 hour(s))  Resp Panel by RT-PCR (Flu A&B, Covid) Nasopharyngeal Swab     Status: Abnormal   Collection Time: 08/31/20  8:11 PM   Specimen: Nasopharyngeal Swab; Nasopharyngeal(NP) swabs in vial transport medium  Result Value Ref Range Status   SARS Coronavirus 2 by RT PCR POSITIVE (A) NEGATIVE Final    Comment: LACIVITA H. 12.12.21 @ 2321 BY MECIAL J. (NOTE) SARS-CoV-2 target nucleic acids are DETECTED.  The SARS-CoV-2 RNA is generally detectable in upper respiratory specimens during the acute phase of infection. Positive results are indicative of the presence of the identified virus, but do not rule out bacterial infection or co-infection with other pathogens not detected by the test. Clinical correlation with patient history and other diagnostic information is necessary to determine patient infection status. The expected result is Negative.  Fact Sheet for Patients: 14.12.21  Fact Sheet for Healthcare Providers: BloggerCourse.com  This test is not yet approved or cleared by the [place] FDA and  has been authorized for detection and/or diagnosis of SARS-CoV-2 by FDA under an Emergency Use Authorization (EUA).  This EUA  will remain in effect (meaning this test can be used) for the duration of  the COVID-19 de claration under Section 564(b)(1) of the Act, 21 U.S.C. section 360bbb-3(b)(1), unless the authorization is terminated or revoked sooner.     Influenza A by PCR NEGATIVE NEGATIVE Final   Influenza B by PCR NEGATIVE NEGATIVE  Final    Comment: (NOTE) The Xpert Xpress SARS-CoV-2/FLU/RSV plus assay is intended as an aid in the diagnosis of influenza from Nasopharyngeal swab specimens and should not be used as a sole basis for treatment. Nasal washings and aspirates are unacceptable for Xpert Xpress SARS-CoV-2/FLU/RSV testing.  Fact Sheet for Patients: BloggerCourse.com  Fact Sheet for Healthcare Providers: SeriousBroker.it  This test is not yet approved or cleared by the [place] FDA and has been authorized for detection and/or diagnosis of SARS-CoV-2 by FDA under an Emergency Use Authorization (EUA). This EUA will remain in effect (meaning this test can be used) for the duration of the COVID-19 declaration under Section 564(b)(1) of the Act, 21 U.S.C. section 360bbb-3(b)(1), unless the authorization is terminated or revoked.  Performed at [facility], [address], [place], [place] [address]      Radiological Exams on Admission: CT Head Wo Contrast  Result Date: 08/31/2020 CLINICAL DATA:  Altered mental status. EXAM: CT HEAD WITHOUT CONTRAST TECHNIQUE: Contiguous axial images were obtained from the base of the skull through the vertex without intravenous contrast. COMPARISON:  None. FINDINGS: Brain: Age related atrophy. Remote right frontal and periventricular infarct with ex vacuo dilatation of the right lateral ventricle. Small remote right occipital infarct. Moderate periventricular and deep white matter hypodensity typical of chronic small vessel ischemia. No hemorrhage or evidence of acute ischemia. No subdural  or extra-axial collection. No midline shift or mass effect. Vascular: Atherosclerosis of skullbase vasculature without hyperdense vessel or abnormal calcification. Skull: No fracture or focal lesion. Sinuses/Orbits: Paranasal sinuses and mastoid air cells are clear. The visualized orbits are unremarkable. Right cataract resection. Other: None. IMPRESSION: 1. No acute intracranial abnormality. 2. Age related atrophy and chronic small vessel ischemia. Remote right frontal and periventricular infarcts. Small remote right occipital infarct. Electronically Signed   By: Narda Rutherford M.D.   On: 08/31/2020 21:17   DG Chest Port 1 View  Result Date: 08/31/2020 CLINICAL DATA:  Questionable sepsis EXAM: PORTABLE CHEST 1 VIEW COMPARISON:  05/19/2017 FINDINGS: Heart is normal size. Aortic calcifications. No confluent opacities or effusions. No acute bony abnormality. IMPRESSION: No active disease. Electronically Signed   By: Charlett Nose M.D.   On: 08/31/2020 20:31    EKG: Independently reviewed.  Normal sinus rhythm.  Assessment/Plan Principal Problem:   COVID-19 virus infection Active Problems:   Essential hypertension   MCI (mild cognitive impairment)   Aortic dissection (HCC)   Controlled type 2 diabetes mellitus with hyperglycemia (HCC)   COVID   Acute lower UTI    1. SIRS likely secondary to Covid infection and UTI.  With recurrent UTI patient is on ceftriaxone follow cultures continue gentle hydration during the Covid see #2. 2. COVID-19 infection presently not hypoxic and chest x-ray does not show any infiltrates.  We'll continue to monitor closely follow inflammatory markers.  I was trying to reach patient's niece to get consent for monoclonal antibodies which patient will qualify at this stage. 3. History of diabetes mellitus type 2 Home medications not known for now keep patient on sliding scale coverage. 4. History of hypertension we'll keep patient on as needed IV labetalol until we know  patient's home medications. 5. History of peripheral vascular disease and abdominal attic aneurysm.  Denies any abdominal pain. 6. Acute renal failure likely from dehydration gently hydrate follow metabolic panel. 7. Generalized weakness and deconditioning likely from #1. 8. History of mild cognitive impairment  Home medication needs to be verified.   DVT  prophylaxis: Lovenox. Code Status: DNR. Family Communication: Tried to reach patient's niece with whom patient lives. Disposition Plan: Home when stable. Consults called: None. Admission status: Observation.   Eduard ClosArshad N Eirik Schueler MD [facility] Pager [phone].  If 7PM-7AM, please contact night-coverage [email or web address] Password [email or web address]  09/01/2020, 3:52 AM

## 2020-09-01 NOTE — Progress Notes (Signed)
Patient arrives to    Room 1526 at this time via stretcher from ED.

## 2020-09-01 NOTE — ED Notes (Signed)
6 oz of orange juice given for CBG of 81

## 2020-09-01 NOTE — ED Notes (Signed)
Patient bending arm while sleeping. Patient unable to remember to keep arm straight due to dementia hx. Arm board applied with coban to patient.

## 2020-09-01 NOTE — ED Notes (Signed)
Called report to Dana, RN 

## 2020-09-01 NOTE — Progress Notes (Signed)
PROGRESS NOTE  Brief Narrative: Kaitlin Smith is an 86 y.o. female with a history of dementia, T2DM, HTN, CVA, PVD, AAA who presented to the ED 12/13 with generalized weakness. In the ED she was febrile with elevated lactic acid improved with fluids. CT head nonacute. Urinalysis suggested UTI for which ceftriaxone was given. SARS-CoV-2 PCR was positive, though CXR was without infiltrates and patient without respiratory symptoms/signs or hypoxia. She was admitted this morning by Dr. Toniann Fail.  Subjective: Confused, denies dyspnea or pain anywhere.   Objective: BP 122/60 (BP Location: Left Arm)    Pulse 82    Temp (!) 101.4 F (38.6 C) (Oral)    Resp 20    Ht 5\' 5"  (1.651 m)    Wt 62.6 kg    SpO2 95%    BMI 22.97 kg/m   Gen: Nontoxic, elderly Pulm: Clear and nonlabored on room air  CV: RRR, no murmur, no JVD, no edema GI: Soft, NT, ND, +BS  Neuro: Confused, follows commands intermittently.  Assessment & Plan: Principal Problem:   COVID-19 virus infection Active Problems:   Essential hypertension   MCI (mild cognitive impairment)   Aortic dissection (HCC)   Controlled type 2 diabetes mellitus with hyperglycemia (HCC)   COVID   Acute lower UTI   Acute hypoxemic respiratory failure due to COVID-19 [facility])  UTI:  - Continue ceftriaxone.  - Urine culture and blood cultures pending at this time  Covid-19 infection: No hypoxia or respiratory symptoms at this time. So no indication for steroids.  - I called Niece without answer this morning. Still hoping to get consent for monoclonal antibody, called again this afternoon, still no answer. - VTE ppx - OOB, IS - Monitor inflammatory markers.   Lymphopenia and thrombocytopenia likely due to covid.   Generalized weakness: Likely related to covid and/or UTI as well as dehydration. CT head nonacute. - PT/OT, IVF  AKI: Improving with IVF.  [facility], MD Pager on amion 09/01/2020, 1:56 PM

## 2020-09-01 NOTE — ED Notes (Signed)
Attempted to call report to Annabelle Harman, RN with no answer. Will call unit secretary now

## 2020-09-02 ENCOUNTER — Inpatient Hospital Stay (HOSPITAL_COMMUNITY): Payer: Medicare Other

## 2020-09-02 DIAGNOSIS — U071 COVID-19: Secondary | ICD-10-CM

## 2020-09-02 DIAGNOSIS — R7989 Other specified abnormal findings of blood chemistry: Secondary | ICD-10-CM

## 2020-09-02 LAB — C-REACTIVE PROTEIN: CRP: 11.8 mg/dL — ABNORMAL HIGH (ref <1.0)

## 2020-09-02 LAB — COMPREHENSIVE METABOLIC PANEL
ALT: 17 U/L (ref 0–44)
AST: 53 U/L — ABNORMAL HIGH (ref 15–41)
Albumin: 2.8 g/dL — ABNORMAL LOW (ref 3.5–5.0)
Alkaline Phosphatase: 48 U/L (ref 38–126)
Anion gap: 15 (ref 5–15)
BUN: 10 mg/dL (ref 8–23)
CO2: 22 mmol/L (ref 22–32)
Calcium: 8 mg/dL — ABNORMAL LOW (ref 8.9–10.3)
Chloride: 105 mmol/L (ref 98–111)
Creatinine, Ser: 0.68 mg/dL (ref 0.44–1.00)
GFR, Estimated: >60 mL/min (ref >60)
Glucose, Bld: 79 mg/dL (ref 70–99)
Potassium: 3.6 mmol/L (ref 3.5–5.1)
Sodium: 142 mmol/L (ref 135–145)
Total Bilirubin: 1.2 mg/dL (ref 0.3–1.2)
Total Protein: 6 g/dL — ABNORMAL LOW (ref 6.5–8.1)

## 2020-09-02 LAB — CBC WITH DIFFERENTIAL/PLATELET
Abs Immature Granulocytes: 0.05 10*3/uL (ref 0.00–0.07)
Basophils Absolute: 0 10*3/uL (ref 0.0–0.1)
Basophils Relative: 0 %
Eosinophils Absolute: 0 10*3/uL (ref 0.0–0.5)
Eosinophils Relative: 0 %
HCT: 45.5 % (ref 36.0–46.0)
Hemoglobin: 14.3 g/dL (ref 12.0–15.0)
Immature Granulocytes: 1 %
Lymphocytes Relative: 16 %
Lymphs Abs: 0.9 10*3/uL (ref 0.7–4.0)
MCH: 31.1 pg (ref 26.0–34.0)
MCHC: 31.4 g/dL (ref 30.0–36.0)
MCV: 98.9 fL (ref 80.0–100.0)
Monocytes Absolute: 0.4 10*3/uL (ref 0.1–1.0)
Monocytes Relative: 7 %
Neutro Abs: 4.4 10*3/uL (ref 1.7–7.7)
Neutrophils Relative %: 76 %
Platelets: 141 10*3/uL — ABNORMAL LOW (ref 150–400)
RBC: 4.6 MIL/uL (ref 3.87–5.11)
RDW: 13.1 % (ref 11.5–15.5)
WBC: 5.7 10*3/uL (ref 4.0–10.5)
nRBC: 0 % (ref 0.0–0.2)

## 2020-09-02 LAB — GLUCOSE, CAPILLARY: Glucose-Capillary: 88 mg/dL (ref 70–99)

## 2020-09-02 LAB — PROCALCITONIN: Procalcitonin: 0.14 ng/mL

## 2020-09-02 LAB — D-DIMER, QUANTITATIVE: D-Dimer, Quant: 2.76 ug/mL-FEU — ABNORMAL HIGH (ref 0.00–0.50)

## 2020-09-02 MED ORDER — SODIUM CHLORIDE 0.9 % IV SOLN
200.0000 mg | Freq: Once | INTRAVENOUS | Status: AC
  Administered 2020-09-02: 200 mg via INTRAVENOUS
  Filled 2020-09-02: qty 200

## 2020-09-02 MED ORDER — ENSURE ENLIVE PO LIQD
237.0000 mL | Freq: Two times a day (BID) | ORAL | Status: DC
  Administered 2020-09-03 – 2020-09-13 (×17): 237 mL via ORAL

## 2020-09-02 MED ORDER — ENOXAPARIN SODIUM 60 MG/0.6ML ~~LOC~~ SOLN: 60.0000 mg | Freq: Two times a day (BID) | SUBCUTANEOUS | Status: DC

## 2020-09-02 MED ORDER — ENOXAPARIN SODIUM 30 MG/0.3ML ~~LOC~~ SOLN: 30.0000 mg | Freq: Two times a day (BID) | SUBCUTANEOUS | Status: DC

## 2020-09-02 MED ORDER — METHYLPREDNISOLONE SODIUM SUCC 40 MG IJ SOLR
40.0000 mg | Freq: Three times a day (TID) | INTRAMUSCULAR | Status: DC
  Administered 2020-09-02 – 2020-09-04 (×6): 40 mg via INTRAVENOUS
  Filled 2020-09-02 (×6): qty 1

## 2020-09-02 MED ORDER — ENOXAPARIN SODIUM 60 MG/0.6ML ~~LOC~~ SOLN
60.0000 mg | Freq: Two times a day (BID) | SUBCUTANEOUS | Status: DC
  Administered 2020-09-02 – 2020-09-13 (×22): 60 mg via SUBCUTANEOUS
  Filled 2020-09-02 (×22): qty 0.6

## 2020-09-02 MED ORDER — SODIUM CHLORIDE 0.9 % IV SOLN: INTRAVENOUS | Status: DC

## 2020-09-02 MED ORDER — SODIUM CHLORIDE 0.9 % IV SOLN
100.0000 mg | Freq: Every day | INTRAVENOUS | Status: AC
  Administered 2020-09-03 – 2020-09-06 (×4): 100 mg via INTRAVENOUS
  Filled 2020-09-02 (×5): qty 20

## 2020-09-02 NOTE — Progress Notes (Signed)
Pt noncompliant with oxygen. Pt with poor po intake. Only taking in bites and sips.  Room air sats low as pt also keeps removing pulse ox sensor. Placed on her ear x1hr with reading of 88% Placed on her finger with reading of 88-93% Offered sweet items in which pt only had bites.  MD Elgergawy aware and pt was started on remdesivir and iv steroids. IVFs also initiated.

## 2020-09-02 NOTE — Evaluation (Signed)
Occupational Therapy Evaluation Patient Details Name: Kaitlin Smith MRN: [id number] DOB: [birth year] Today's Date: 09/02/2020    History of Present Illness Patient is an 86 year old female hx of dementia, T2DM, HTN, CVA, PVD, AAA who presented to the ED 12/13 with generalized weakness. Patient positive for COVID   Clinical Impression   Patient with hx of dementia and currently disoriented to place, situation. Unable to provide level of assist history. Per chart patient's niece is her caregiver. Currently patient with decreased task initiation requiring multimodal cues and total A x2 to sit up at EOB. Patient with intermittent sitting balance due to posterior lean/attempting to lay back onto bed. With mod A x2 patient stood EOB for ~10 seconds before initiating sitting back onto bed. Recommend continued acute OT services in order to increase patient balance, activity tolerance, safety to facilitate D/C to venue listed below.    Follow Up Recommendations  SNF    Equipment Recommendations  Other (comment) (unsure of home DME)       Precautions / Restrictions Precautions Precautions: Fall Precaution Comments: monitor O2 currently on room air (would not keep O2 on when placed) Restrictions Weight Bearing Restrictions: No      Mobility Bed Mobility Overal bed mobility: Needs Assistance Bed Mobility: Supine to Sit;Sit to Supine     Supine to sit: Total assist;+2 for physical assistance;+2 for safety/equipment Sit to supine: Max assist;+2 for physical assistance;+2 for safety/equipment   General bed mobility comments: patient not initiating bed mobility despite multimodal cues, use of bed pad to sit up at EOB. patient does initiate laying trunk onto bed with max A x2 for safety to position back to supine    Transfers Overall transfer level: Needs assistance Equipment used: 2 person hand held assist Transfers: Sit to/from Stand Sit to Stand: Mod assist;+2 physical assistance;+2  safety/equipment         General transfer comment: mod A x2 to power up to standing, patient with posterior lean and decreased activity tolerance wanting to quickly sit back onto EOB    Balance Overall balance assessment: Needs assistance Sitting-balance support: Feet supported;Bilateral upper extremity supported Sitting balance-Leahy Scale: Poor Sitting balance - Comments: intermintent min to mod A as patient would lay herself back onto bed   Standing balance support: Bilateral upper extremity supported Standing balance-Leahy Scale: Zero Standing balance comment: mod x2 to static stand and patient leaning posteriorly                           ADL either performed or assessed with clinical judgement   ADL Overall ADL's : Needs assistance/impaired     Grooming: Maximal assistance;Bed level;Sitting   Upper Body Bathing: Maximal assistance;Bed level;Sitting   Lower Body Bathing: Total assistance;Bed level;Sitting/lateral leans   Upper Body Dressing : Maximal assistance;Bed level;Sitting   Lower Body Dressing: Total assistance;Sitting/lateral leans;Bed level   Toilet Transfer: Moderate assistance;+2 for physical assistance;+2 for safety/equipment Toilet Transfer Details (indicate cue type and reason): patient stood EOB with hand held assist x2 and mod A x2 due to posterior lean, decreased safety awareness, activity tolerance Toileting- Clothing Manipulation and Hygiene: Total assistance;Bed level       Functional mobility during ADLs: Moderate assistance;+2 for physical assistance;+2 for safety/equipment General ADL Comments: unsure of patient's baseline however patient requiring max to total A for all ADLs and mod x2 to power up to standing at EOB     Vision Baseline Vision/History: Legally blind  Pertinent Vitals/Pain Pain Assessment: Faces Faces Pain Scale: No hurt     Hand Dominance  (did not specify)   Extremity/Trunk Assessment Upper  Extremity Assessment Upper Extremity Assessment: Generalized weakness   Lower Extremity Assessment Lower Extremity Assessment: Defer to PT evaluation       Communication Communication Communication: HOH   Cognition Arousal/Alertness: Awake/alert Behavior During Therapy: Flat affect Overall Cognitive Status: No family/caregiver present to determine baseline cognitive functioning                                 General Comments: patient disoriented to place, situation, decreased initiation of directions. patient also taking off O2 when placed on 1L due to readings in mid 80s. RN aware              Home Living Family/patient expects to be discharged to:: Private residence Living Arrangements: Other relatives Available Help at Discharge: Family                             Additional Comments: patient with hx dementia, unable to provide history. per chart lives with her niece whom is her caregiver      Prior Functioning/Environment Level of Independence: Needs assistance        Comments: per chart niece is patient's caregiver, patient unable to provide further history or assist levels        OT Problem List: Decreased strength;Decreased activity tolerance;Impaired balance (sitting and/or standing);Decreased cognition;Decreased safety awareness;Cardiopulmonary status limiting activity      OT Treatment/Interventions: Self-care/ADL training;Therapeutic exercise;DME and/or AE instruction;Therapeutic activities;Cognitive remediation/compensation;Patient/family education;Balance training    OT Goals(Current goals can be found in the care plan section) Acute Rehab OT Goals Patient Stated Goal: "I'm tired" OT Goal Formulation: With patient Time For Goal Achievement: 09/16/20 Potential to Achieve Goals: Fair  OT Frequency: Min 2X/week    AM-PAC OT "6 Clicks" Daily Activity     Outcome Measure Help from another person eating meals?: Total Help from  another person taking care of personal grooming?: A Lot Help from another person toileting, which includes using toliet, bedpan, or urinal?: Total Help from another person bathing (including washing, rinsing, drying)?: A Lot Help from another person to put on and taking off regular upper body clothing?: A Lot Help from another person to put on and taking off regular lower body clothing?: Total 6 Click Score: 9   End of Session Nurse Communication: Mobility status  Activity Tolerance: Patient limited by fatigue Patient left: in bed;with call bell/phone within reach;with bed alarm set  OT Visit Diagnosis: Unsteadiness on feet (R26.81);Other abnormalities of gait and mobility (R26.89);Muscle weakness (generalized) (M62.81);Other symptoms and signs involving cognitive function                Time: 5573-2202 OT Time Calculation (min): 23 min Charges:  OT General Charges $OT Visit: 1 Visit OT Evaluation $OT Eval Low Complexity: 1 Low  Marlyce Huge OT OT pager: [phone]  Carmelia Roller 09/02/2020, 11:28 AM

## 2020-09-02 NOTE — Evaluation (Signed)
Physical Therapy Evaluation Patient Details Name: Kaitlin Smith MRN: [id number] DOB: Jul 04, 1930 Today's Date: 09/02/2020   History of Present Illness  Patient is an 86 year old female hx of dementia, T2DM, HTN, CVA, PVD, AAA who presented to the ED 12/13 with generalized weakness. Patient positive for COVID  Clinical Impression  The patient is awake, requires encouragement to mobilize with therapist to sitting. Patient  Kept attempting to return to supine. Patient did stand x 1 with 2  Person handhold. Patient currently will require 24/7 assistance. May benefit from SNF for rehab. Pt admitted with above diagnosis. Pt currently with functional limitations due to the deficits listed below (see PT Problem List). Pt will benefit from skilled PT to increase their independence and safety with mobility to allow discharge to the venue listed below.       Follow Up Recommendations SNF vs  Home with 24/7 caregivers.    Equipment Recommendations  None recommended by PT    Recommendations for Other Services       Precautions / Restrictions Precautions Precautions: Fall Precaution Comments: monitor O2 currently on room air (would not keep O2 on when placed) Restrictions Weight Bearing Restrictions: No      Mobility  Bed Mobility Overal bed mobility: Needs Assistance Bed Mobility: Supine to Sit;Sit to Supine     Supine to sit: Total assist;+2 for physical assistance;+2 for safety/equipment Sit to supine: Max assist;+2 for physical assistance;+2 for safety/equipment   General bed mobility comments: patient not initiating bed mobility despite multimodal cues, use of bed pad to sit up at EOB. patient does initiate laying trunk onto bed with max A x2 for safety to position back to supine    Transfers Overall transfer level: Needs assistance Equipment used: 2 person hand held assist Transfers: Sit to/from Stand Sit to Stand: Mod assist;+2 physical assistance;+2 safety/equipment          General transfer comment: mod A x2 to power up to standing, patient with posterior lean and decreased activity tolerance wanting to quickly sit back onto EOB  Ambulation/Gait                Stairs            Wheelchair Mobility    Modified Rankin (Stroke Patients Only)       Balance Overall balance assessment: Needs assistance Sitting-balance support: Feet supported;Bilateral upper extremity supported Sitting balance-Leahy Scale: Poor Sitting balance - Comments: intermintent min to mod A as patient would lay herself back onto bed   Standing balance support: Bilateral upper extremity supported Standing balance-Leahy Scale: Zero Standing balance comment: mod x2 to static stand and patient leaning posteriorly                             Pertinent Vitals/Pain Pain Assessment: Faces Faces Pain Scale: No hurt    Home Living Family/patient expects to be discharged to:: Private residence Living Arrangements: Other relatives Available Help at Discharge: Family             Additional Comments: patient with hx dementia, unable to provide history. per chart lives with her niece whom is her caregiver    Prior Function Level of Independence: Needs assistance         Comments: per chart niece is patient's caregiver, patient unable to provide further history or assist levels     Hand Dominance   Dominant Hand:  (did not specify)    Extremity/Trunk  Assessment   Upper Extremity Assessment Upper Extremity Assessment: Generalized weakness    Lower Extremity Assessment Lower Extremity Assessment: Generalized weakness    Cervical / Trunk Assessment Cervical / Trunk Assessment: Normal  Communication   Communication: HOH  Cognition Arousal/Alertness: Awake/alert Behavior During Therapy: Flat affect Overall Cognitive Status: No family/caregiver present to determine baseline cognitive functioning                                  General Comments: patient disoriented to place, situation, decreased initiation of directions. patient also taking off O2 when placed on 1L due to readings in mid 80s. RN aware. Patient states" why don't yall gio to sleep. I'm tired>"      General Comments      Exercises     Assessment/Plan    PT Assessment Patient needs continued PT services  PT Problem List Decreased strength;Decreased mobility;Decreased cognition;Decreased activity tolerance;Decreased balance;Cardiopulmonary status limiting activity       PT Treatment Interventions DME instruction;Gait training;Functional mobility training;Therapeutic exercise;Therapeutic activities;Cognitive remediation;Patient/family education    PT Goals (Current goals can be found in the Care Plan section)  Acute Rehab PT Goals Patient Stated Goal: "I'm tired" PT Goal Formulation: Patient unable to participate in goal setting Time For Goal Achievement: 09/16/20 Potential to Achieve Goals: Fair    Frequency Min 2X/week   Barriers to discharge        Co-evaluation PT/OT/SLP Co-Evaluation/Treatment: Yes Reason for Co-Treatment: For patient/therapist safety;To address functional/ADL transfers PT goals addressed during session: Mobility/safety with mobility OT goals addressed during session: ADL's and self-care       AM-PAC PT "6 Clicks" Mobility  Outcome Measure Help needed turning from your back to your side while in a flat bed without using bedrails?: A Lot Help needed moving from lying on your back to sitting on the side of a flat bed without using bedrails?: A Lot Help needed moving to and from a bed to a chair (including a wheelchair)?: A Lot Help needed standing up from a chair using your arms (e.g., wheelchair or bedside chair)?: A Lot Help needed to walk in hospital room?: Total Help needed climbing 3-5 steps with a railing? : Total 6 Click Score: 10    End of Session   Activity Tolerance: Patient tolerated treatment  well Patient left: in bed;with call bell/phone within reach;with bed alarm set Nurse Communication: Mobility status PT Visit Diagnosis: Unsteadiness on feet (R26.81);Difficulty in walking, not elsewhere classified (R26.2)    Time: 9604-5409 PT Time Calculation (min) (ACUTE ONLY): 21 min   Charges:   PT Evaluation $PT Eval Low Complexity: 1 Low           Blanchard Kelch PT Acute Rehabilitation Services Pager [phone] Office [phone]   Rada Hay 09/02/2020, 12:37 PM

## 2020-09-02 NOTE — Progress Notes (Signed)
PROGRESS NOTE                                                                             PROGRESS NOTE                                                                                                                                                                                                             Patient Demographics:    Kaitlin Smith, is a 86 y.o. female, DOB - 07-02-1930, FTD:[id number]  Outpatient Primary MD for the patient is Alysia Penna, MD    LOS - 1  Admit date - 08/31/2020    Chief Complaint  Patient presents with  . Weakness       Brief Narrative   HPI: Kaitlin Smith is a 86 y.o. female with history of dementia, diabetes mellitus type 2, hypertension stroke peripheral vascular disease abdominal aortic aneurysm was found to be weak unable to get up from a recliner over the last 12 hours and was brought to the ER.  Patient's family member has been tested positive for Covid infection as per the report.  Patient unable to provide a good history but denies any chest pain or shortness of breath abdominal pain nausea vomiting or diarrhea.  ED Course: In the ER patient was febrile with temperature 103 F tachycardic with elevated lactic acid which improved with fluids.  Patient's UA is consistent with UTI and patient also tested positive for Covid infection.  Patient was not hypoxic chest x-ray does not show any infiltrates.  Patient admitted for further observation.  Started on ceftriaxone for UTI.  CT of the head is unremarkable.   Subjective:    Kaitlin Smith today is very poor historian, she herself denies any complaints, no significant events overnight as discussed with staff.     Assessment  & Plan :    Principal Problem:   COVID-19 virus infection Active Problems:   Essential hypertension   MCI (mild cognitive impairment)   Aortic dissection (HCC)   Controlled type 2 diabetes mellitus  with hyperglycemia (HCC)   COVID    Acute lower UTI   Acute hypoxemic respiratory failure due to COVID-19 (HCC)  Acute Hypoxic Resp. Failure due to Acute Covid 19 Viral Pneumonitis during the ongoing 2020 Covid 19 Pandemic  -Unclear about her vaccination status, she is demented, and unable to get in touch of her knees -This morning she is saturating 87 to 88% on room air, she is currently on 2 L nasal cannula. -She will be started on steroids and Remdesivir. -Continue to trend inflammatory markers. -Procalcitonin 0.14, no indication for antibiotics -D-dimers at 2.76, will check venous Doppler, will continue with Lovenox 0.5 meg per cake every 12 hours  Encouraged the patient to sit up in chair in the daytime use I-S and flutter valve for pulmonary toiletry and then prone in bed when at night.  Will advance activity and titrate down oxygen as possible.      SpO2: 100 % O2 Flow Rate (L/min): 5 L/min  Recent Labs  Lab 08/31/20 2010 08/31/20 2011 08/31/20 2358 09/01/20 0610 09/02/20 0359  WBC 6.6  --   --  4.8 5.7  PLT 179  --   --  141* 141*  CRP  --   --   --   --  11.8*  DDIMER  --   --   --   --  2.76*  PROCALCITON  --   --   --   --  0.14  AST 51*  --   --   --  53*  ALT 23  --   --   --  17  ALKPHOS 66  --   --   --  48  BILITOT 1.6*  --   --   --  1.2  ALBUMIN 3.8  --   --   --  2.8*  INR 1.0  --   --   --   --   LATICACIDVEN 2.0*  --  1.9  --   --   SARSCOV2NAA  --  POSITIVE*  --   --   --        ABG     Component Value Date/Time   HCO3 24.1 (H) 09/13/2015 2319   TCO2 28 08/16/2018 2322   ACIDBASEDEF 2.2 (H) 09/13/2015 2319   O2SAT 60.4 09/13/2015 2319      UTI:  - Continue ceftriaxone.  -Urine culture and blood culture grew remains with no growth  Lymphopenia and thrombocytopenia likely due to covid.   Generalized weakness:  -Consult PT/OT   AKI: -Resolved with IV fluids  Condition - Extremely Guarded  Family Communication  : Unable to leave her needs a voicemail as reported  voice mailbox is full  Code Status :  DNR  Consults  :  none  Disposition Plan  :    Status is: Inpatient  Remains inpatient appropriate because:IV treatments appropriate due to intensity of illness or inability to take PO   Dispo: The patient is from: Home              Anticipated d/c is to: SNF              Anticipated d/c date is: 3 days              Patient currently is not medically stable to d/c.      DVT Prophylaxis  :  Lovenox  Lab Results  Component Value Date   PLT 141 (L) 09/02/2020    Diet :  Diet Order            Diet heart healthy/carb modified Room service appropriate? Yes; Fluid consistency: Thin  Diet effective now                  Inpatient Medications  Scheduled Meds: . enoxaparin (LOVENOX) injection  40 mg Subcutaneous Q24H  . methylPREDNISolone (SOLU-MEDROL) injection  40 mg Intravenous Q8H   Continuous Infusions: . cefTRIAXone (ROCEPHIN)  IV 1 g (09/01/20 2054)  . [START ON 09/03/2020] remdesivir 100 mg in NS 100 mL    . remdesivir 200 mg in sodium chloride 0.9% 250 mL IVPB     PRN Meds:.acetaminophen **OR** acetaminophen, labetalol  Antibiotics  :    Anti-infectives (From admission, onward)   Start     Dose/Rate Route Frequency Ordered Stop   09/03/20 1000  remdesivir 100 mg in sodium chloride 0.9 % 100 mL IVPB        100 mg 200 mL/hr over 30 Minutes Intravenous Daily 09/02/20 1048 09/07/20 0959   09/02/20 1200  remdesivir 200 mg in sodium chloride 0.9% 250 mL IVPB        200 mg 580 mL/hr over 30 Minutes Intravenous Once 09/02/20 1048     08/31/20 2000  cefTRIAXone (ROCEPHIN) 1 g in sodium chloride 0.9 % 100 mL IVPB        1 g 200 mL/hr over 30 Minutes Intravenous Every 24 hours 08/31/20 1949          Huey Bienenstock M.D on 09/02/2020 at 12:33 PM  To page go to [email or web address]  [facility] -  Office  [phone]       Objective:   Vitals:   09/01/20 2053 09/01/20 2054 09/01/20 2214 09/02/20 0437  BP:    (!) 177/81 140/87  Pulse:   97 (!) 109  Resp:   20 20  Temp:   99 F (37.2 C) 99.8 F (37.7 C)  TempSrc:   Oral Oral  SpO2: (!) 85% (!) 88% 94% 100%  Weight:      Height:        Wt Readings from Last 3 Encounters:  08/31/20 62.6 kg  06/28/19 62.6 kg  11/30/18 64.9 kg     Intake/Output Summary (Last 24 hours) at 09/02/2020 1233 Last data filed at 09/02/2020 0900 Gross per 24 hour  Intake 1192 ml  Output 1100 ml  Net 92 ml     Physical Exam  Awake Alert, confused, with impaired cognition and insight, but pleasant, deconditioned, frail . Symmetrical Chest wall movement, Good air movement bilaterally, scattered rales RRR,No Gallops,Rubs or new Murmurs, No Parasternal Heave +ve B.Sounds, Abd Soft, No tenderness, No rebound - guarding or rigidity. No Cyanosis, Clubbing or edema, No new Rash or bruise      Data Review:    CBC Recent Labs  Lab 08/31/20 2010 09/01/20 0610 09/02/20 0359  WBC 6.6 4.8 5.7  HGB 16.5* 14.4 14.3  HCT 51.0* 45.1 45.5  PLT 179 141* 141*  MCV 97.3 97.0 98.9  MCH 31.5 31.0 31.1  MCHC 32.4 31.9 31.4  RDW 13.0 13.0 13.1  LYMPHSABS 0.4*  --  0.9  MONOABS 0.3  --  0.4  EOSABS 0.0  --  0.0  BASOSABS 0.0  --  0.0    Recent Labs  Lab 08/31/20 2010 08/31/20 2358 09/01/20 0610 09/02/20 0359  NA 139  --   --  142  K 4.4  --   --  3.6  CL 103  --   --  105  CO2 24  --   --  22  GLUCOSE 114*  --   --  79  BUN 31*  --   --  10  CREATININE 1.05*  --  0.67 0.68  CALCIUM 9.0  --   --  8.0*  AST 51*  --   --  53*  ALT 23  --   --  17  ALKPHOS 66  --   --  48  BILITOT 1.6*  --   --  1.2  ALBUMIN 3.8  --   --  2.8*  CRP  --   --   --  11.8*  DDIMER  --   --   --  2.76*  PROCALCITON  --   --   --  0.14  LATICACIDVEN 2.0* 1.9  --   --   INR 1.0  --   --   --   HGBA1C  --   --  4.8  --     ------------------------------------------------------------------------------------------------------------------ No results for input(s): CHOL,  HDL, LDLCALC, TRIG, CHOLHDL, LDLDIRECT in the last 72 hours.  Lab Results  Component Value Date   HGBA1C 4.8 09/01/2020   ------------------------------------------------------------------------------------------------------------------ No results for input(s): TSH, T4TOTAL, T3FREE, THYROIDAB in the last 72 hours.  Invalid input(s): FREET3  Cardiac Enzymes No results for input(s): CKMB, TROPONINI, MYOGLOBIN in the last 168 hours.  Invalid input(s): CK ------------------------------------------------------------------------------------------------------------------ No results found for: BNP  Micro Results Recent Results (from the past 240 hour(s))  Blood Culture (routine x 2)     Status: None (Preliminary result)   Collection Time: 08/31/20  8:10 PM   Specimen: BLOOD  Result Value Ref Range Status   Specimen Description   Final    BLOOD LEFT ANTECUBITAL Performed at [facility], [address], [place], [place] [address]    Special Requests   Final    BOTTLES DRAWN AEROBIC AND ANAEROBIC Blood Culture adequate volume Performed at [facility], [address] [place], [place] [address]    Culture   Final    NO GROWTH 2 DAYS Performed at [facility], [address] [place], [place] [address]    Report Status PENDING  Incomplete  Resp Panel by RT-PCR (Flu A&B, Covid) Nasopharyngeal Swab     Status: Abnormal   Collection Time: 08/31/20  8:11 PM   Specimen: Nasopharyngeal Swab; Nasopharyngeal(NP) swabs in vial transport medium  Result Value Ref Range Status   SARS Coronavirus 2 by RT PCR POSITIVE (A) NEGATIVE Final    Comment: LACIVITA H. 12.12.21 @ 2321 BY MECIAL J. (NOTE) SARS-CoV-2 target nucleic acids are DETECTED.  The SARS-CoV-2 RNA is generally detectable in upper respiratory specimens during the acute phase of infection. Positive results are indicative of the presence of the identified virus, but do not rule out  bacterial infection or co-infection with other pathogens not detected by the test. Clinical correlation with patient history and other diagnostic information is necessary to determine patient infection status. The expected result is Negative.  Fact Sheet for Patients: BloggerCourse.com  Fact Sheet for Healthcare Providers: SeriousBroker.it  This test is not yet approved or cleared by the [place] FDA and  has been authorized for detection and/or diagnosis of SARS-CoV-2 by FDA under an Emergency Use Authorization (EUA).  This EUA will remain in effect (meaning this test can be used) for the duration of  the COVID-19 de claration under Section 564(b)(1) of the Act, 21 U.S.C. section 360bbb-3(b)(1), unless  the authorization is terminated or revoked sooner.     Influenza A by PCR NEGATIVE NEGATIVE Final   Influenza B by PCR NEGATIVE NEGATIVE Final    Comment: (NOTE) The Xpert Xpress SARS-CoV-2/FLU/RSV plus assay is intended as an aid in the diagnosis of influenza from Nasopharyngeal swab specimens and should not be used as a sole basis for treatment. Nasal washings and aspirates are unacceptable for Xpert Xpress SARS-CoV-2/FLU/RSV testing.  Fact Sheet for Patients: BloggerCourse.com  Fact Sheet for Healthcare Providers: SeriousBroker.it  This test is not yet approved or cleared by the [place] FDA and has been authorized for detection and/or diagnosis of SARS-CoV-2 by FDA under an Emergency Use Authorization (EUA). This EUA will remain in effect (meaning this test can be used) for the duration of the COVID-19 declaration under Section 564(b)(1) of the Act, 21 U.S.C. section 360bbb-3(b)(1), unless the authorization is terminated or revoked.  Performed at [facility], [address] [place], [place] [address]   Urine culture     Status: None    Collection Time: 08/31/20 10:52 PM   Specimen: In/Out Cath Urine  Result Value Ref Range Status   Specimen Description   Final    IN/OUT CATH URINE Performed at [facility], [address] [place], [place] [address]    Special Requests   Final    NONE Performed at [facility], [address] [place], [place] [address]    Culture   Final    NO GROWTH Performed at [facility], [address] [place], [place] [address]    Report Status 09/01/2020 FINAL  Final  Blood Culture (routine x 2)     Status: None (Preliminary result)   Collection Time: 09/01/20 10:11 AM   Specimen: BLOOD LEFT HAND  Result Value Ref Range Status   Specimen Description   Final    BLOOD LEFT HAND Performed at [facility], [address], [place], [place] [address]    Special Requests   Final    BOTTLES DRAWN AEROBIC ONLY Blood Culture adequate volume Performed at [facility], [address] [place], [place] [address]    Culture   Final    NO GROWTH < 24 HOURS Performed at [facility], [address], [place], [place] [address]    Report Status PENDING  Incomplete    Radiology Reports CT Head Wo Contrast  Result Date: 08/31/2020 CLINICAL DATA:  Altered mental status. EXAM: CT HEAD WITHOUT CONTRAST TECHNIQUE: Contiguous axial images were obtained from the base of the skull through the vertex without intravenous contrast. COMPARISON:  None. FINDINGS: Brain: Age related atrophy. Remote right frontal and periventricular infarct with ex vacuo dilatation of the right lateral ventricle. Small remote right occipital infarct. Moderate periventricular and deep white matter hypodensity typical of chronic small vessel ischemia. No hemorrhage or evidence of acute ischemia. No subdural or extra-axial collection. No midline shift or mass effect. Vascular: Atherosclerosis of skullbase vasculature without hyperdense vessel or  abnormal calcification. Skull: No fracture or focal lesion. Sinuses/Orbits: Paranasal sinuses and mastoid air cells are clear. The visualized orbits are unremarkable. Right cataract resection. Other: None. IMPRESSION: 1. No acute intracranial abnormality. 2. Age related atrophy and chronic small vessel ischemia. Remote right frontal and periventricular infarcts. Small remote right occipital infarct. Electronically Signed   By: Narda Rutherford M.D.   On: 08/31/2020 21:17   DG Chest Port 1 View  Result Date: 08/31/2020  CLINICAL DATA:  Questionable sepsis EXAM: PORTABLE CHEST 1 VIEW COMPARISON:  05/19/2017 FINDINGS: Heart is normal size. Aortic calcifications. No confluent opacities or effusions. No acute bony abnormality. IMPRESSION: No active disease. Electronically Signed   By: Charlett NoseKevin  Dover M.D.   On: 08/31/2020 20:31

## 2020-09-02 NOTE — TOC Initial Note (Addendum)
Transition of Care [facility]) - Initial/Assessment Note    Patient Details  Name: Kaitlin Smith MRN: [id number] Date of Birth: 1929/11/01  Transition of Care [facility]) CM/SW Contact:    Ida Rogue, LCSW Phone Number: 09/02/2020, 11:35 AM  Clinical Narrative:  Edythe Clarity (Niece) [phone] to talk to her about OT recommendation of  SNF.  No answer.  Voicemail full.  Will try again. TOC will continue to follow during the course of hospitalization.  Addendum: Attempted to reach niece again.  Same result.  Expected Discharge Plan:  (unknown)     Patient Goals and CMS Choice        Expected Discharge Plan and Services Expected Discharge Plan:  (unknown)                                              Prior Living Arrangements/Services                       Activities of Daily Living Home Assistive Devices/Equipment: Walker (specify type),CBG Meter (4 wheeled walker) ADL Screening (condition at time of admission) Patient's cognitive ability adequate to safely complete daily activities?: No Is the patient deaf or have difficulty hearing?: Yes (HOH) Does the patient have difficulty seeing, even when wearing glasses/contacts?: Yes Does the patient have difficulty concentrating, remembering, or making decisions?: Yes Patient able to express need for assistance with ADLs?: Yes Does the patient have difficulty dressing or bathing?: Yes Independently performs ADLs?: No Communication: Independent Dressing (OT): Dependent Is this a change from baseline?: Change from baseline, expected to last >3 days Grooming: Dependent Is this a change from baseline?: Change from baseline, expected to last >3 days Feeding: Dependent Is this a change from baseline?: Change from baseline, expected to last >3 days Bathing: Dependent Is this a change from baseline?: Change from baseline, expected to last >3 days Toileting: Dependent Is this a change from baseline?: Change  from baseline, expected to last >3days In/Out Bed: Dependent Is this a change from baseline?: Change from baseline, expected to last >3 days Walks in Home: Dependent Is this a change from baseline?: Change from baseline, expected to last >3 days Does the patient have difficulty walking or climbing stairs?: Yes (secondary to weakness) Weakness of Legs: Both Weakness of Arms/Hands: Both  Permission Sought/Granted                  Emotional Assessment              Admission diagnosis:  Severe sepsis (HCC) [A41.9, R65.20] COVID [U07.1] COVID-19 virus infection [U07.1] Acute hypoxemic respiratory failure due to COVID-19 (HCC) [U07.1, J96.01] Patient Active Problem List   Diagnosis Date Noted  . Controlled type 2 diabetes mellitus with hyperglycemia (HCC) 09/01/2020  . COVID 09/01/2020  . Acute lower UTI 09/01/2020  . Acute hypoxemic respiratory failure due to COVID-19 (HCC) 09/01/2020  . COVID-19 virus infection 08/31/2020  . Aortic dissection (HCC) 08/17/2018  . Hypertensive emergency 08/17/2018  . Abnormal EKG 01/30/2018  . Osteoarthritis of right knee 12/04/2017  . MCI (mild cognitive impairment) 06/30/2015  . Essential hypertension 08/20/2014  . Hyperlipidemia 08/20/2014  . Peripheral arterial disease (HCC) 08/20/2014  . Carotid artery disease (HCC) 06/06/2014  . Uncontrolled diabetes mellitus type 2 without complications 05/13/2014  . History of CVA (cerebrovascular accident) 05/13/2014  . History of MI (myocardial infarction)  05/13/2014   PCP:  Alysia Penna, MD Pharmacy:   [facility] [phone] [address] [address], [place] - [phone] AT [facility] [address] [place] [address] Phone: [phone] Fax: [phone]     Social Determinants of Health (SDOH) Interventions    Readmission Risk Interventions No flowsheet data found.

## 2020-09-02 NOTE — Progress Notes (Signed)
Bilateral lower extremity venous duplex completed. Refer to "CV Proc" under chart review to view preliminary results.  Preliminary results discussed with Dr. Randol Kern.  09/02/2020 1:15 PM Eula Fried., MHA, RVT, RDCS, RDMS  For Clint Guy, RVT

## 2020-09-02 NOTE — Progress Notes (Signed)
ANTICOAGULATION CONSULT NOTE - Initial Consult  Pharmacy Consult for Lovenox Indication: DVT  Allergies  Allergen Reactions  . Milk-Related Compounds Other (See Comments)    Lactose intolerant.      Patient Measurements: Height: 5\' 5"  (165.1 cm) Weight: 62.6 kg (138 lb 0.1 oz) IBW/kg (Calculated) : 57  Vital Signs: Temp: 99.8 F (37.7 C) (12/15 0437) Temp Source: Oral (12/15 0437) BP: 140/87 (12/15 0437) Pulse Rate: 109 (12/15 0437)  Labs: Recent Labs    08/31/20 2010 09/01/20 0610 09/02/20 0359  HGB 16.5* 14.4 14.3  HCT 51.0* 45.1 45.5  PLT 179 141* 141*  APTT 27  --   --   LABPROT 12.7  --   --   INR 1.0  --   --   CREATININE 1.05* 0.67 0.68    Estimated Creatinine Clearance: 43.7 mL/min (by C-G formula based on SCr of 0.68 mg/dL).   Medical History: Past Medical History:  Diagnosis Date  . Allergy   . Arthritis   . Blindness   . Coronary artery disease   . Diabetes mellitus without complication (HCC)   . Glaucoma   . Hyperlipidemia   . Hypertension   . Myocardial infarction (HCC)   . Peripheral vascular disease (HCC)   . Stroke [facility])     Medications:  Scheduled:  . enoxaparin (LOVENOX) injection  30 mg Subcutaneous Q12H  . feeding supplement  237 mL Oral BID BM  . methylPREDNISolone (SOLU-MEDROL) injection  40 mg Intravenous Q8H   Infusions:  . sodium chloride    . cefTRIAXone (ROCEPHIN)  IV 1 g (09/01/20 2054)  . [START ON 09/03/2020] remdesivir 100 mg in NS 100 mL     PRN: acetaminophen **OR** acetaminophen, labetalol  Assessment: 86 yo female admitted with COVID pneumonia.  D-dimer elevated, doppler +DVT, pharmacy consulted to dose Lovenox.  Goal of Therapy:  Anti-Xa level 0.6-1 units/ml 4hrs after LMWH dose given Monitor platelets by anticoagulation protocol: Yes   Plan:  Increase Lovenox to 1mg /kg SQ q12h Monitor CBC, renal function, signs/symptoms of bleeding  98, PharmD, BCPS Pharmacy: [phone] 09/02/2020,1:31  PM

## 2020-09-03 ENCOUNTER — Inpatient Hospital Stay: Payer: Self-pay

## 2020-09-03 ENCOUNTER — Other Ambulatory Visit: Payer: Self-pay

## 2020-09-03 LAB — BLOOD CULTURE ID PANEL (REFLEXED) - BCID2

## 2020-09-03 LAB — GLUCOSE, CAPILLARY
Glucose-Capillary: 140 mg/dL — ABNORMAL HIGH (ref 70–99)
Glucose-Capillary: 151 mg/dL — ABNORMAL HIGH (ref 70–99)

## 2020-09-03 LAB — C-REACTIVE PROTEIN: CRP: 11.6 mg/dL — ABNORMAL HIGH (ref <1.0)

## 2020-09-03 MED ORDER — CHLORHEXIDINE GLUCONATE CLOTH 2 % EX PADS
6.0000 | MEDICATED_PAD | Freq: Every day | CUTANEOUS | Status: DC
  Administered 2020-09-03 – 2020-09-13 (×10): 6 via TOPICAL

## 2020-09-03 MED ORDER — SODIUM CHLORIDE 0.9% FLUSH
10.0000 mL | INTRAVENOUS | Status: DC | PRN
  Administered 2020-09-05: 10 mL

## 2020-09-03 NOTE — TOC Progression Note (Addendum)
Transition of Care [facility]) - Progression Note    Patient Details  Name: Batool Majid MRN: [id number] Date of Birth: [birth year]  Transition of Care [facility]) CM/SW Contact  Ida Rogue, [place] Phone Number: 09/03/2020, 10:05 AM  Clinical Narrative:   Edythe Clarity (Niece) [phone], her son Langston Reusing answered, stated his mother is in [facility] ICU with COVID.  When I explained I was needing help in dispositional planning, he deferred to his mother, stating she would need to make that call.  Will attempt to reach her own room phone later today. TOC will continue to follow during the course of hospitalization.  Addendum: I went to see Ms Geroge Baseman was in her room sleeping soundly. According to [facility], based on her interactions with patient today I would likely not get much information from her.   Will follow up tomorrow.     Expected Discharge Plan:  (unknown)    Expected Discharge Plan and Services Expected Discharge Plan:  (unknown)                                               Social Determinants of Health (SDOH) Interventions    Readmission Risk Interventions No flowsheet data found.

## 2020-09-03 NOTE — Progress Notes (Signed)
Peripherally Inserted Central Catheter Placement  The IV Nurse has discussed with the patient and/or persons authorized to consent for the patient, the purpose of this procedure and the potential benefits and risks involved with this procedure.  The benefits include less needle sticks, lab draws from the catheter, and the patient may be discharged home with the catheter. Risks include, but not limited to, infection, bleeding, blood clot (thrombus formation), and puncture of an artery; nerve damage and irregular heartbeat and possibility to perform a PICC exchange if needed/ordered by physician.  Alternatives to this procedure were also discussed.  Bard Power PICC patient education guide, fact sheet on infection prevention and patient information card has been provided to patient /or left at bedside.    PICC Placement Documentation  PICC Double Lumen 09/03/20 PICC Right Basilic 38 cm 0 cm (Active)  Indication for Insertion or Continuance of Line Poor Vasculature-patient has had multiple peripheral attempts or PIVs lasting less than 24 hours 09/03/20 1804  Exposed Catheter (cm) 0 cm 09/03/20 1804  Site Assessment Clean;Dry;Intact 09/03/20 1804  Lumen #1 Status Flushed;Blood return noted;Saline locked 09/03/20 1804  Lumen #2 Status Flushed;Blood return noted;Saline locked 09/03/20 1804  Dressing Type Transparent 09/03/20 1804  Dressing Status Clean;Dry;Intact 09/03/20 1804  Antimicrobial disc in place? Yes 09/03/20 1804  Dressing Change Due 09/10/20 09/03/20 1804       Kaitlin Smith 09/03/2020, 6:06 PM

## 2020-09-03 NOTE — Progress Notes (Signed)
PHARMACY - PHYSICIAN COMMUNICATION CRITICAL VALUE ALERT - BLOOD CULTURE IDENTIFICATION (BCID)  Kaitlin Smith is an 86 y.o. female who presented to [facility] on 08/31/2020 with a chief complaint of COVID PMA  Assessment:  MS-CoNS in 1/2 BCx (1/3 bottles) - suspect contaminant  Name of physician (or Provider) Contacted: Elgergawy  Current antibiotics: none, COVID tx only  Changes to prescribed antibiotics recommended: none Recommendations accepted by provider  Results for orders placed or performed during the hospital encounter of 08/31/20  Blood Culture ID Panel (Reflexed) (Collected: 09/01/2020 10:11 AM)  Result Value Ref Range   Enterococcus faecalis NOT DETECTED NOT DETECTED   Enterococcus Faecium NOT DETECTED NOT DETECTED   Listeria monocytogenes NOT DETECTED NOT DETECTED   Staphylococcus species DETECTED (A) NOT DETECTED   Staphylococcus aureus (BCID) NOT DETECTED NOT DETECTED   Staphylococcus epidermidis NOT DETECTED NOT DETECTED   Staphylococcus lugdunensis NOT DETECTED NOT DETECTED   Streptococcus species NOT DETECTED NOT DETECTED   Streptococcus agalactiae NOT DETECTED NOT DETECTED   Streptococcus pneumoniae NOT DETECTED NOT DETECTED   Streptococcus pyogenes NOT DETECTED NOT DETECTED   A.calcoaceticus-baumannii NOT DETECTED NOT DETECTED   Bacteroides fragilis NOT DETECTED NOT DETECTED   Enterobacterales NOT DETECTED NOT DETECTED   Enterobacter cloacae complex NOT DETECTED NOT DETECTED   Escherichia coli NOT DETECTED NOT DETECTED   Klebsiella aerogenes NOT DETECTED NOT DETECTED   Klebsiella oxytoca NOT DETECTED NOT DETECTED   Klebsiella pneumoniae NOT DETECTED NOT DETECTED   Proteus species NOT DETECTED NOT DETECTED   Salmonella species NOT DETECTED NOT DETECTED   Serratia marcescens NOT DETECTED NOT DETECTED   Haemophilus influenzae NOT DETECTED NOT DETECTED   Neisseria meningitidis NOT DETECTED NOT DETECTED   Pseudomonas aeruginosa NOT DETECTED NOT DETECTED    Stenotrophomonas maltophilia NOT DETECTED NOT DETECTED   Candida albicans NOT DETECTED NOT DETECTED   Candida auris NOT DETECTED NOT DETECTED   Candida glabrata NOT DETECTED NOT DETECTED   Candida krusei NOT DETECTED NOT DETECTED   Candida parapsilosis NOT DETECTED NOT DETECTED   Candida tropicalis NOT DETECTED NOT DETECTED   Cryptococcus neoformans/gattii NOT DETECTED NOT DETECTED    Kaitlin Smith A 09/03/2020  3:44 PM

## 2020-09-03 NOTE — Progress Notes (Signed)
Consent for PICC insertion obtained via patients nephew Langston Reusing. Explained process, and reason for this need and no further questions or concerned noted. Verified witness by this RN and Britta Mccreedy, LPN.

## 2020-09-03 NOTE — Progress Notes (Signed)
PROGRESS NOTE                                                                             PROGRESS NOTE                                                                                                                                                                                                             Patient Demographics:    Kaitlin Smith, is a 86 y.o. female, DOB - May 15, 1930, WUJ:[id number]  Outpatient Primary MD for the patient is Alysia Penna, MD    LOS - 2  Admit date - 08/31/2020    Chief Complaint  Patient presents with  . Weakness       Brief Narrative   Kaitlin Smith is a 86 y.o. female with history of dementia, diabetes mellitus type 2, hypertension stroke peripheral vascular disease abdominal aortic aneurysm was found to be weak unable to get up from a recliner over the last 12 hours and was brought to the ER.  Patient's family member has been tested positive for Covid infection as per the report.  Patient unable to provide a good history but denies any chest pain or shortness of breath abdominal pain nausea vomiting or diarrhea.  ED Course: In the ER patient was febrile with temperature 103 F tachycardic with elevated lactic acid which improved with fluids.  Patient's UA is consistent with UTI and patient also tested positive for Covid infection.  Patient was not hypoxic chest x-ray does not show any infiltrates.  Patient admitted for further observation.  Started on ceftriaxone for UTI.  CT of the head is unremarkable.   Subjective:    Kaitlin Smith today soft denies any complaints, but she is very poor historian, but no significant events as discussed with staff, she remains with very poor appetite. .   Assessment  & Plan :    Principal Problem:   COVID-19 virus infection Active Problems:   Essential hypertension   MCI (mild cognitive impairment)   Aortic dissection (HCC)  Controlled type 2 diabetes mellitus with  hyperglycemia (HCC)   COVID   Acute lower UTI   Acute hypoxemic respiratory failure due to COVID-19 (HCC)  Acute Hypoxic Resp. Failure due to Acute Covid 19 Viral Pneumonitis during the ongoing 2020 Covid 19 Pandemic  -Unclear about her vaccination status, she is demented, and unable to get in touch of her family members. -Is with hypoxia on 2 L nasal cannula yesterday, this appears to be improved today . -Continue with steroids . -Continue with remdesivir  -Continue to trend inflammatory markers. -Procalcitonin 0.14, no indication for antibiotics -D-dimers at 2.76, venous Doppler significant for acute DVT.  Marland Kitchen  Encouraged the patient to sit up in chair in the daytime use I-S and flutter valve for pulmonary toiletry and then prone in bed when at night.  Will advance activity and titrate down oxygen as possible.      SpO2: 91 % O2 Flow Rate (L/min): 5 L/min  Recent Labs  Lab 08/31/20 2010 08/31/20 2011 08/31/20 2358 09/01/20 0610 09/02/20 0359 09/03/20 0814  WBC 6.6  --   --  4.8 5.7  --   PLT 179  --   --  141* 141*  --   CRP  --   --   --   --  11.8* 11.6*  DDIMER  --   --   --   --  2.76*  --   PROCALCITON  --   --   --   --  0.14  --   AST 51*  --   --   --  53*  --   ALT 23  --   --   --  17  --   ALKPHOS 66  --   --   --  48  --   BILITOT 1.6*  --   --   --  1.2  --   ALBUMIN 3.8  --   --   --  2.8*  --   INR 1.0  --   --   --   --   --   LATICACIDVEN 2.0*  --  1.9  --   --   --   SARSCOV2NAA  --  POSITIVE*  --   --   --   --        ABG     Component Value Date/Time   HCO3 24.1 (H) 09/13/2015 2319   TCO2 28 08/16/2018 2322   ACIDBASEDEF 2.2 (H) 09/13/2015 2319   O2SAT 60.4 09/13/2015 2319    Acute DVT -Elevated D-dimers on admission, venous Doppler significant for right popliteal DVT, given COVID-19 infection and hypercoagulable status, will continue with full anticoagulation.  UTI:  - Continue ceftriaxone.  -Urine culture and blood culture grew  remains with no growth  Lymphopenia and thrombocytopenia likely due to covid.   Generalized weakness:  -Consult PT/OT   AKI: -Resolved with IV fluids  Condition - Extremely Guarded  Family Communication  : D/W nephew by phone.  Code Status :  DNR  Consults  :  none  Disposition Plan  :    Status is: Inpatient  Remains inpatient appropriate because:IV treatments appropriate due to intensity of illness or inability to take PO   Dispo: The patient is from: Home              Anticipated d/c is to: SNF              Anticipated d/c date is: 3 days  Patient currently is not medically stable to d/c.      DVT Prophylaxis  :  Lovenox  Lab Results  Component Value Date   PLT 141 (L) 09/02/2020    Diet :  Diet Order            Diet heart healthy/carb modified Room service appropriate? Yes; Fluid consistency: Thin  Diet effective now                  Inpatient Medications  Scheduled Meds: . enoxaparin (LOVENOX) injection  60 mg Subcutaneous Q12H  . feeding supplement  237 mL Oral BID BM  . methylPREDNISolone (SOLU-MEDROL) injection  40 mg Intravenous Q8H   Continuous Infusions: . sodium chloride 50 mL/hr at 09/03/20 0559  . remdesivir 100 mg in NS 100 mL 100 mg (09/03/20 1003)   PRN Meds:.acetaminophen **OR** acetaminophen, labetalol  Antibiotics  :    Anti-infectives (From admission, onward)   Start     Dose/Rate Route Frequency Ordered Stop   09/03/20 1000  remdesivir 100 mg in sodium chloride 0.9 % 100 mL IVPB        100 mg 200 mL/hr over 30 Minutes Intravenous Daily 09/02/20 1048 09/07/20 0959   09/02/20 1200  remdesivir 200 mg in sodium chloride 0.9% 250 mL IVPB        200 mg 580 mL/hr over 30 Minutes Intravenous Once 09/02/20 1048 09/02/20 1311   08/31/20 2000  cefTRIAXone (ROCEPHIN) 1 g in sodium chloride 0.9 % 100 mL IVPB        1 g 200 mL/hr over 30 Minutes Intravenous Every 24 hours 08/31/20 1949 09/02/20 2338        Mliss Fritz  Sha Amer M.D on 09/03/2020 at 1:44 PM  To page go to [email or web address]  [facility] -  Office  [phone]       Objective:   Vitals:   09/02/20 0437 09/02/20 1300 09/02/20 2155 09/03/20 0447  BP: 140/87 139/72 (!) 145/80 (!) 151/71  Pulse: (!) 109 77 83 89  Resp: Temp: 99.8 F (37.7 C) 97.9 F (36.6 C) 99.3 F (37.4 C) 99 F (37.2 C)  TempSrc: Oral Oral Oral Oral  SpO2: 100%  94% 91%  Weight:      Height:        Wt Readings from Last 3 Encounters:  08/31/20 62.6 kg  06/28/19 62.6 kg  11/30/18 64.9 kg     Intake/Output Summary (Last 24 hours) at 09/03/2020 1344 Last data filed at 09/03/2020 0600 Gross per 24 hour  Intake 1041.17 ml  Output 1100 ml  Net -58.83 ml     Physical Exam  Awake , demented , pleasant . Fair air entry B/L, no wheezing. RRR, no rubs or gallops BS present, NT, soft. No Cyanosis, no edema.     Data Review:    CBC Recent Labs  Lab 08/31/20 2010 09/01/20 0610 09/02/20 0359  WBC 6.6 4.8 5.7  HGB 16.5* 14.4 14.3  HCT 51.0* 45.1 45.5  PLT 179 141* 141*  MCV 97.3 97.0 98.9  MCH 31.5 31.0 31.1  MCHC 32.4 31.9 31.4  RDW 13.0 13.0 13.1  LYMPHSABS 0.4*  --  0.9  MONOABS 0.3  --  0.4  EOSABS 0.0  --  0.0  BASOSABS 0.0  --  0.0    Recent Labs  Lab 08/31/20 2010 08/31/20 2358 09/01/20 0610 09/02/20 0359 09/03/20 0814  NA 139  --   --  142  --  K 4.4  --   --  3.6  --   CL 103  --   --  105  --   CO2 24  --   --  22  --   GLUCOSE 114*  --   --  79  --   BUN 31*  --   --  10  --   CREATININE 1.05*  --  0.67 0.68  --   CALCIUM 9.0  --   --  8.0*  --   AST 51*  --   --  53*  --   ALT 23  --   --  17  --   ALKPHOS 66  --   --  48  --   BILITOT 1.6*  --   --  1.2  --   ALBUMIN 3.8  --   --  2.8*  --   CRP  --   --   --  11.8* 11.6*  DDIMER  --   --   --  2.76*  --   PROCALCITON  --   --   --  0.14  --   LATICACIDVEN 2.0* 1.9  --   --   --   INR 1.0  --   --   --   --   HGBA1C  --   --  4.8  --    --     ------------------------------------------------------------------------------------------------------------------ No results for input(s): CHOL, HDL, LDLCALC, TRIG, CHOLHDL, LDLDIRECT in the last 72 hours.  Lab Results  Component Value Date   HGBA1C 4.8 09/01/2020   ------------------------------------------------------------------------------------------------------------------ No results for input(s): TSH, T4TOTAL, T3FREE, THYROIDAB in the last 72 hours.  Invalid input(s): FREET3  Cardiac Enzymes No results for input(s): CKMB, TROPONINI, MYOGLOBIN in the last 168 hours.  Invalid input(s): CK ------------------------------------------------------------------------------------------------------------------ No results found for: BNP  Micro Results Recent Results (from the past 240 hour(s))  Blood Culture (routine x 2)     Status: None (Preliminary result)   Collection Time: 08/31/20  8:10 PM   Specimen: BLOOD  Result Value Ref Range Status   Specimen Description   Final    BLOOD LEFT ANTECUBITAL Performed at [facility], [address], [place], [place] [address]    Special Requests   Final    BOTTLES DRAWN AEROBIC AND ANAEROBIC Blood Culture adequate volume Performed at [facility], [address] [place], [place] [address]    Culture   Final    NO GROWTH 2 DAYS Performed at [facility], [address], [place], [place] [address]    Report Status PENDING  Incomplete  Resp Panel by RT-PCR (Flu A&B, Covid) Nasopharyngeal Swab     Status: Abnormal   Collection Time: 08/31/20  8:11 PM   Specimen: Nasopharyngeal Swab; Nasopharyngeal(NP) swabs in vial transport medium  Result Value Ref Range Status   SARS Coronavirus 2 by RT PCR POSITIVE (A) NEGATIVE Final    Comment: LACIVITA H. 12.12.21 @ 2321 BY MECIAL J. (NOTE) SARS-CoV-2 target nucleic acids are DETECTED.  The SARS-CoV-2 RNA is generally detectable in upper  respiratory specimens during the acute phase of infection. Positive results are indicative of the presence of the identified virus, but do not rule out bacterial infection or co-infection with other pathogens not detected by the test. Clinical correlation with patient history and other diagnostic information is necessary to determine patient infection status. The expected result is Negative.  Fact Sheet for Patients: BloggerCourse.com  Fact Sheet  for Healthcare Providers: SeriousBroker.it  This test is not yet approved or cleared by the [place] and  has been authorized for detection and/or diagnosis of SARS-CoV-2 by FDA under an Emergency Use Authorization (EUA).  This EUA will remain in effect (meaning this test can be used) for the duration of  the COVID-19 de claration under Section 564(b)(1) of the Act, 21 U.S.C. section 360bbb-3(b)(1), unless the authorization is terminated or revoked sooner.     Influenza A by PCR NEGATIVE NEGATIVE Final   Influenza B by PCR NEGATIVE NEGATIVE Final    Comment: (NOTE) The Xpert Xpress SARS-CoV-2/FLU/RSV plus assay is intended as an aid in the diagnosis of influenza from Nasopharyngeal swab specimens and should not be used as a sole basis for treatment. Nasal washings and aspirates are unacceptable for Xpert Xpress SARS-CoV-2/FLU/RSV testing.  Fact Sheet for Patients: BloggerCourse.com  Fact Sheet for Healthcare Providers: SeriousBroker.it  This test is not yet approved or cleared by the [place] FDA and has been authorized for detection and/or diagnosis of SARS-CoV-2 by FDA under an Emergency Use Authorization (EUA). This EUA will remain in effect (meaning this test can be used) for the duration of the COVID-19 declaration under Section 564(b)(1) of the Act, 21 U.S.C. section 360bbb-3(b)(1), unless the authorization is  terminated or revoked.  Performed at [facility], [address] [place], [place] [address]   Urine culture     Status: None   Collection Time: 08/31/20 10:52 PM   Specimen: In/Out Cath Urine  Result Value Ref Range Status   Specimen Description   Final    IN/OUT CATH URINE Performed at [facility], [address] [place], [place] [address]    Special Requests   Final    NONE Performed at [facility], [address], [place], [place] [address]    Culture   Final    NO GROWTH Performed at [facility], [address], [place], [place] [address]    Report Status 09/01/2020 FINAL  Final  Blood Culture (routine x 2)     Status: None (Preliminary result)   Collection Time: 09/01/20 10:11 AM   Specimen: BLOOD LEFT HAND  Result Value Ref Range Status   Specimen Description   Final    BLOOD LEFT HAND Performed at [facility], [address] [place], [place] [address]    Special Requests   Final    BOTTLES DRAWN AEROBIC ONLY Blood Culture adequate volume Performed at [facility], [address] [place], [place] [address]    Culture   Final    NO GROWTH < 24 HOURS Performed at [facility], [address] [place], [place] [address]    Report Status PENDING  Incomplete    Radiology Reports CT Head Wo Contrast  Result Date: 08/31/2020 CLINICAL DATA:  Altered mental status. EXAM: CT HEAD WITHOUT CONTRAST TECHNIQUE: Contiguous axial images were obtained from the base of the skull through the vertex without intravenous contrast. COMPARISON:  None. FINDINGS: Brain: Age related atrophy. Remote right frontal and periventricular infarct with ex vacuo dilatation of the right lateral ventricle. Small remote right occipital infarct. Moderate periventricular and deep white matter hypodensity typical of chronic small vessel ischemia. No hemorrhage or evidence of acute ischemia.  No subdural or extra-axial collection. No midline shift or mass effect. Vascular: Atherosclerosis of skullbase vasculature without hyperdense vessel or abnormal calcification. Skull: No fracture or focal lesion.  Sinuses/Orbits: Paranasal sinuses and mastoid air cells are clear. The visualized orbits are unremarkable. Right cataract resection. Other: None. IMPRESSION: 1. No acute intracranial abnormality. 2. Age related atrophy and chronic small vessel ischemia. Remote right frontal and periventricular infarcts. Small remote right occipital infarct. Electronically Signed   By: Narda Rutherford M.D.   On: 08/31/2020 21:17   DG Chest Port 1 View  Result Date: 08/31/2020 CLINICAL DATA:  Questionable sepsis EXAM: PORTABLE CHEST 1 VIEW COMPARISON:  05/19/2017 FINDINGS: Heart is normal size. Aortic calcifications. No confluent opacities or effusions. No acute bony abnormality. IMPRESSION: No active disease. Electronically Signed   By: Charlett Nose M.D.   On: 08/31/2020 20:31   VAS [place] LOWER EXTREMITY VENOUS (DVT)  Result Date: 09/02/2020  Lower Venous DVT Study Indications: Covid-19, Elevated D-Dimer.  Limitations: Patient did not wish to cooperate with positioning. Refused to separate or rotate legs. Arterial calcification also noted causing shadowing. Comparison Study: No previous exam. Performing Technologist: Clint Guy  Examination Guidelines: A complete evaluation includes B-mode imaging, spectral Doppler, color Doppler, and power Doppler as needed of all accessible portions of each vessel. Bilateral testing is considered an integral part of a complete examination. Limited examinations for reoccurring indications may be performed as noted. The reflux portion of the exam is performed with the patient in reverse Trendelenburg.  +---------+---------------+---------+-----------+----------+--------------+ RIGHT    CompressibilityPhasicitySpontaneityPropertiesThrombus Aging  +---------+---------------+---------+-----------+----------+--------------+ CFV      Full           Yes      Yes                                 +---------+---------------+---------+-----------+----------+--------------+ SFJ      Full                                                        +---------+---------------+---------+-----------+----------+--------------+ FV Prox  Full                                                        +---------+---------------+---------+-----------+----------+--------------+ FV Mid   Full                                                        +---------+---------------+---------+-----------+----------+--------------+ FV DistalFull                                                        +---------+---------------+---------+-----------+----------+--------------+ PFV      Full                                                        +---------+---------------+---------+-----------+----------+--------------+  POP      None                                         Acute          +---------+---------------+---------+-----------+----------+--------------+ PTV      Full                                                        +---------+---------------+---------+-----------+----------+--------------+   Right Technical Findings: Not visualized segments include Peroneal vessels not visualized.  +---------+---------------+---------+-----------+----------+--------------+ LEFT     CompressibilityPhasicitySpontaneityPropertiesThrombus Aging +---------+---------------+---------+-----------+----------+--------------+ CFV      Full           Yes      Yes                                 +---------+---------------+---------+-----------+----------+--------------+ SFJ      Full                                                        +---------+---------------+---------+-----------+----------+--------------+ FV Prox  Full                                                         +---------+---------------+---------+-----------+----------+--------------+ FV Mid   Full                                                        +---------+---------------+---------+-----------+----------+--------------+ FV DistalFull                                                        +---------+---------------+---------+-----------+----------+--------------+ PFV      Full                                                        +---------+---------------+---------+-----------+----------+--------------+ PTV      Full                                                        +---------+---------------+---------+-----------+----------+--------------+   Left Technical Findings: Not visualized segments include Popliteal and Peroneal vessels not visualized.   Summary: RIGHT: - Findings consistent with acute deep vein thrombosis involving the right  popliteal vein. - No cystic structure found in the popliteal fossa.  LEFT: - There is no evidence of deep vein thrombosis in the lower extremity.  - No cystic structure found in the popliteal fossa.  *See table(s) above for measurements and observations. Electronically signed by Coral ElseVance Brabham MD on 09/02/2020 at 10:06:25 PM.    Final

## 2020-09-03 NOTE — Plan of Care (Signed)
  Problem: Coping: Goal: Level of anxiety will decrease Outcome: Progressing   Problem: Skin Integrity: Goal: Risk for impaired skin integrity will decrease Outcome: Progressing   

## 2020-09-04 ENCOUNTER — Other Ambulatory Visit: Payer: Self-pay

## 2020-09-04 LAB — COMPREHENSIVE METABOLIC PANEL
ALT: 16 U/L (ref 0–44)
AST: 27 U/L (ref 15–41)
Albumin: 2.2 g/dL — ABNORMAL LOW (ref 3.5–5.0)
Alkaline Phosphatase: 38 U/L (ref 38–126)
Anion gap: 9 (ref 5–15)
BUN: 22 mg/dL (ref 8–23)
CO2: 26 mmol/L (ref 22–32)
Calcium: 8.1 mg/dL — ABNORMAL LOW (ref 8.9–10.3)
Chloride: 109 mmol/L (ref 98–111)
Creatinine, Ser: 0.54 mg/dL (ref 0.44–1.00)
GFR, Estimated: >60 mL/min (ref >60)
Glucose, Bld: 155 mg/dL — ABNORMAL HIGH (ref 70–99)
Potassium: 3.9 mmol/L (ref 3.5–5.1)
Sodium: 144 mmol/L (ref 135–145)
Total Bilirubin: 0.9 mg/dL (ref 0.3–1.2)
Total Protein: 5.2 g/dL — ABNORMAL LOW (ref 6.5–8.1)

## 2020-09-04 LAB — CBC WITH DIFFERENTIAL/PLATELET
Abs Immature Granulocytes: 0.12 10*3/uL — ABNORMAL HIGH (ref 0.00–0.07)
Basophils Absolute: 0 10*3/uL (ref 0.0–0.1)
Basophils Relative: 0 %
Eosinophils Absolute: 0 10*3/uL (ref 0.0–0.5)
Eosinophils Relative: 0 %
HCT: 38.6 % (ref 36.0–46.0)
Hemoglobin: 12.6 g/dL (ref 12.0–15.0)
Immature Granulocytes: 2 %
Lymphocytes Relative: 10 %
Lymphs Abs: 0.7 10*3/uL (ref 0.7–4.0)
MCH: 31.1 pg (ref 26.0–34.0)
MCHC: 32.6 g/dL (ref 30.0–36.0)
MCV: 95.3 fL (ref 80.0–100.0)
Monocytes Absolute: 0.5 10*3/uL (ref 0.1–1.0)
Monocytes Relative: 7 %
Neutro Abs: 5.9 10*3/uL (ref 1.7–7.7)
Neutrophils Relative %: 81 %
Platelets: 213 10*3/uL (ref 150–400)
RBC: 4.05 MIL/uL (ref 3.87–5.11)
RDW: 13.1 % (ref 11.5–15.5)
WBC: 7.2 10*3/uL (ref 4.0–10.5)
nRBC: 0 % (ref 0.0–0.2)

## 2020-09-04 LAB — CULTURE, BLOOD (ROUTINE X 2): Special Requests: ADEQUATE

## 2020-09-04 LAB — D-DIMER, QUANTITATIVE: D-Dimer, Quant: 0.85 ug/mL-FEU — ABNORMAL HIGH (ref 0.00–0.50)

## 2020-09-04 LAB — C-REACTIVE PROTEIN: CRP: 5.1 mg/dL — ABNORMAL HIGH (ref <1.0)

## 2020-09-04 MED ORDER — METHYLPREDNISOLONE SODIUM SUCC 40 MG IJ SOLR
40.0000 mg | Freq: Two times a day (BID) | INTRAMUSCULAR | Status: DC
  Administered 2020-09-04 – 2020-09-10 (×12): 40 mg via INTRAVENOUS
  Filled 2020-09-04 (×13): qty 1

## 2020-09-04 MED ORDER — PANTOPRAZOLE SODIUM 40 MG PO TBEC
40.0000 mg | DELAYED_RELEASE_TABLET | Freq: Every day | ORAL | Status: DC
  Administered 2020-09-04 – 2020-09-13 (×10): 40 mg via ORAL
  Filled 2020-09-04 (×10): qty 1

## 2020-09-04 MED ORDER — ONDANSETRON HCL 4 MG/2ML IJ SOLN
4.0000 mg | Freq: Three times a day (TID) | INTRAMUSCULAR | Status: DC | PRN
  Administered 2020-09-04 – 2020-09-08 (×4): 4 mg via INTRAVENOUS
  Filled 2020-09-04 (×4): qty 2

## 2020-09-04 NOTE — Progress Notes (Signed)
PROGRESS NOTE                                                                             PROGRESS NOTE                                                                                                                                                                                                             Patient Demographics:    Kaitlin Smith, is a 86 y.o. female, DOB - January 16, 1930, YIR:[id number]  Outpatient Primary MD for the patient is Alysia Penna, MD    LOS - 3  Admit date - 08/31/2020    Chief Complaint  Patient presents with  . Weakness       Brief Narrative   Kaitlin Smith is a 86 y.o. female with history of dementia, diabetes mellitus type 2, hypertension stroke peripheral vascular disease abdominal aortic aneurysm was found to be weak unable to get up from a recliner over the last 12 hours and was brought to the ER.  Patient's family member has been tested positive for Covid infection as per the report.  Patient unable to provide a good history but denies any chest pain or shortness of breath abdominal pain nausea vomiting or diarrhea.  ED Course: In the ER patient was febrile with temperature 103 F tachycardic with elevated lactic acid which improved with fluids.  Patient's UA is consistent with UTI and patient also tested positive for Covid infection.  Patient was not hypoxic chest x-ray does not show any infiltrates.  Patient admitted for further observation.  Started on ceftriaxone for UTI.  CT of the head is unremarkable.   Subjective:    Kaitlin Smith today no significant events as discussed with staff, her appetite has improved , she remains with poor fluid intake .   Assessment  & Plan :    Principal Problem:   COVID-19 virus infection Active Problems:   Essential hypertension   MCI (mild cognitive impairment)   Aortic dissection (HCC)   Controlled type 2 diabetes mellitus with  hyperglycemia (HCC)   COVID   Acute lower  UTI   Acute hypoxemic respiratory failure due to COVID-19 (HCC)  Acute Hypoxic Resp. Failure due to Acute Covid 19 Viral Pneumonitis during the ongoing 2020 Covid 19 Pandemic  -She is unvaccinated  -Oxygen requirement has improved, she is on 1 L nasal cannula today.  . -Continue with steroids . -Continue with remdesivir  -Continue to trend inflammatory markers. -Procalcitonin 0.14, no indication for antibiotics -D-dimers at 2.76, venous Doppler significant for acute DVT.  Marland Kitchen  Encouraged the patient to sit up in chair in the daytime use I-S and flutter valve for pulmonary toiletry and then prone in bed when at night.  Will advance activity and titrate down oxygen as possible.      SpO2: 93 % O2 Flow Rate (L/min): 1 L/min  Recent Labs  Lab 08/31/20 2010 08/31/20 2011 08/31/20 2358 09/01/20 0610 09/02/20 0359 09/03/20 0814 09/04/20 0636  WBC 6.6  --   --  4.8 5.7  --  7.2  PLT 179  --   --  141* 141*  --  213  CRP  --   --   --   --  11.8* 11.6* 5.1*  DDIMER  --   --   --   --  2.76*  --  0.85*  PROCALCITON  --   --   --   --  0.14  --   --   AST 51*  --   --   --  53*  --  27  ALT 23  --   --   --  17  --  16  ALKPHOS 66  --   --   --  48  --  38  BILITOT 1.6*  --   --   --  1.2  --  0.9  ALBUMIN 3.8  --   --   --  2.8*  --  2.2*  INR 1.0  --   --   --   --   --   --   LATICACIDVEN 2.0*  --  1.9  --   --   --   --   SARSCOV2NAA  --  POSITIVE*  --   --   --   --   --        ABG     Component Value Date/Time   HCO3 24.1 (H) 09/13/2015 2319   TCO2 28 08/16/2018 2322   ACIDBASEDEF 2.2 (H) 09/13/2015 2319   O2SAT 60.4 09/13/2015 2319    Acute DVT -Elevated D-dimers on admission, venous Doppler significant for right popliteal DVT, given COVID-19 infection and hypercoagulable status, will be started on full anticoagulation  UTI:  - Continue ceftriaxone.   Lymphopenia and thrombocytopenia likely due to covid.   Generalized weakness:  -Consult PT/OT    AKI: -Resolved with IV fluids  Condition - Extremely Guarded  Family Communication  : D/W nephew by phone 12/16.  Code Status :  DNR  Consults  :  none  Disposition Plan  :    Status is: Inpatient  Remains inpatient appropriate because:IV treatments appropriate due to intensity of illness or inability to take PO   Dispo: The patient is from: Home              Anticipated d/c is to: SNF              Anticipated d/c date is: 2 days  Patient currently is not medically stable to d/c.  Patient is significantly weak, deconditioned, she lives with her niece who is currently hospitalized for COVID-19, plan for SNF placement.      DVT Prophylaxis  :  Lovenox  Lab Results  Component Value Date   PLT 213 09/04/2020    Diet :  Diet Order            Diet heart healthy/carb modified Room service appropriate? Yes; Fluid consistency: Thin  Diet effective now                  Inpatient Medications  Scheduled Meds: . Chlorhexidine Gluconate Cloth  6 each Topical Daily  . enoxaparin (LOVENOX) injection  60 mg Subcutaneous Q12H  . feeding supplement  237 mL Oral BID BM  . methylPREDNISolone (SOLU-MEDROL) injection  40 mg Intravenous Q12H   Continuous Infusions: . sodium chloride 50 mL/hr at 09/03/20 0559  . remdesivir 100 mg in NS 100 mL 100 mg (09/04/20 1005)   PRN Meds:.acetaminophen **OR** acetaminophen, labetalol, ondansetron (ZOFRAN) IV, sodium chloride flush  Antibiotics  :    Anti-infectives (From admission, onward)   Start     Dose/Rate Route Frequency Ordered Stop   09/03/20 1000  remdesivir 100 mg in sodium chloride 0.9 % 100 mL IVPB        100 mg 200 mL/hr over 30 Minutes Intravenous Daily 09/02/20 1048 09/07/20 0959   09/02/20 1200  remdesivir 200 mg in sodium chloride 0.9% 250 mL IVPB        200 mg 580 mL/hr over 30 Minutes Intravenous Once 09/02/20 1048 09/02/20 1311   08/31/20 2000  cefTRIAXone (ROCEPHIN) 1 g in sodium chloride 0.9 % 100  mL IVPB        1 g 200 mL/hr over 30 Minutes Intravenous Every 24 hours 08/31/20 1949 09/02/20 2338        Mliss Fritz Adreana Coull M.D on 09/04/2020 at 4:57 PM  To page go to [email or web address]  [facility] -  Office  [phone]       Objective:   Vitals:   09/04/20 0339 09/04/20 0430 09/04/20 0500 09/04/20 1500  BP: 139/72   (!) 141/68  Pulse: 79   77  Resp: (!) 21   20  Temp: 97.8 F (36.6 C)   98.4 F (36.9 C)  TempSrc: Oral   Oral  SpO2: 94% (!) 83% 95% 93%  Weight:      Height:        Wt Readings from Last 3 Encounters:  08/31/20 62.6 kg  06/28/19 62.6 kg  11/30/18 64.9 kg     Intake/Output Summary (Last 24 hours) at 09/04/2020 1657 Last data filed at 09/04/2020 1600 Gross per 24 hour  Intake 2310.02 ml  Output 400 ml  Net 1910.02 ml     Physical Exam  He is more awake and interactive today, pleasant, but significantly demented, legally blind  Symmetrical Chest wall movement, Good air movement bilaterally, CTAB RRR,No Gallops,Rubs or new Murmurs, No Parasternal Heave +ve B.Sounds, Abd Soft, No tenderness, No rebound - guarding or rigidity. No Cyanosis, Clubbing or edema, No new Rash or bruise        Data Review:    CBC Recent Labs  Lab 08/31/20 2010 09/01/20 0610 09/02/20 0359 09/04/20 0636  WBC 6.6 4.8 5.7 7.2  HGB 16.5* 14.4 14.3 12.6  HCT 51.0* 45.1 45.5 38.6  PLT 179 141* 141* 213  MCV 97.3 97.0 98.9 95.3  MCH 31.5 31.0 31.1  31.1  MCHC 32.4 31.9 31.4 32.6  RDW 13.0 13.0 13.1 13.1  LYMPHSABS 0.4*  --  0.9 0.7  MONOABS 0.3  --  0.4 0.5  EOSABS 0.0  --  0.0 0.0  BASOSABS 0.0  --  0.0 0.0    Recent Labs  Lab 08/31/20 2010 08/31/20 2358 09/01/20 0610 09/02/20 0359 09/03/20 0814 09/04/20 0636  NA 139  --   --  142  --  144  K 4.4  --   --  3.6  --  3.9  CL 103  --   --  105  --  109  CO2 24  --   --  22  --  26  GLUCOSE 114*  --   --  79  --  155*  BUN 31*  --   --  10  --  22  CREATININE 1.05*  --  0.67 0.68  --  0.54   CALCIUM 9.0  --   --  8.0*  --  8.1*  AST 51*  --   --  53*  --  27  ALT 23  --   --  17  --  16  ALKPHOS 66  --   --  48  --  38  BILITOT 1.6*  --   --  1.2  --  0.9  ALBUMIN 3.8  --   --  2.8*  --  2.2*  CRP  --   --   --  11.8* 11.6* 5.1*  DDIMER  --   --   --  2.76*  --  0.85*  PROCALCITON  --   --   --  0.14  --   --   LATICACIDVEN 2.0* 1.9  --   --   --   --   INR 1.0  --   --   --   --   --   HGBA1C  --   --  4.8  --   --   --     ------------------------------------------------------------------------------------------------------------------ No results for input(s): CHOL, HDL, LDLCALC, TRIG, CHOLHDL, LDLDIRECT in the last 72 hours.  Lab Results  Component Value Date   HGBA1C 4.8 09/01/2020   ------------------------------------------------------------------------------------------------------------------ No results for input(s): TSH, T4TOTAL, T3FREE, THYROIDAB in the last 72 hours.  Invalid input(s): FREET3  Cardiac Enzymes No results for input(s): CKMB, TROPONINI, MYOGLOBIN in the last 168 hours.  Invalid input(s): CK ------------------------------------------------------------------------------------------------------------------ No results found for: BNP  Micro Results Recent Results (from the past 240 hour(s))  Blood Culture (routine x 2)     Status: None (Preliminary result)   Collection Time: 08/31/20  8:10 PM   Specimen: BLOOD  Result Value Ref Range Status   Specimen Description   Final    BLOOD LEFT ANTECUBITAL Performed at [facility], [address], [place], [place] [address]    Special Requests   Final    BOTTLES DRAWN AEROBIC AND ANAEROBIC Blood Culture adequate volume Performed at [facility], [address], [place], [place] [address]    Culture   Final    NO GROWTH 4 DAYS Performed at [facility], [address], [place], [place] [address]    Report Status PENDING  Incomplete  Resp Panel by  RT-PCR (Flu A&B, Covid) Nasopharyngeal Swab     Status: Abnormal   Collection Time: 08/31/20  8:11 PM   Specimen: Nasopharyngeal Swab; Nasopharyngeal(NP) swabs in vial transport medium  Result Value Ref Range Status  SARS Coronavirus 2 by RT PCR POSITIVE (A) NEGATIVE Final    Comment: LACIVITA H. 12.12.21 @ 2321 BY MECIAL J. (NOTE) SARS-CoV-2 target nucleic acids are DETECTED.  The SARS-CoV-2 RNA is generally detectable in upper respiratory specimens during the acute phase of infection. Positive results are indicative of the presence of the identified virus, but do not rule out bacterial infection or co-infection with other pathogens not detected by the test. Clinical correlation with patient history and other diagnostic information is necessary to determine patient infection status. The expected result is Negative.  Fact Sheet for Patients: BloggerCourse.com  Fact Sheet for Healthcare Providers: SeriousBroker.it  This test is not yet approved or cleared by the [place] FDA and  has been authorized for detection and/or diagnosis of SARS-CoV-2 by FDA under an Emergency Use Authorization (EUA).  This EUA will remain in effect (meaning this test can be used) for the duration of  the COVID-19 de claration under Section 564(b)(1) of the Act, 21 U.S.C. section 360bbb-3(b)(1), unless the authorization is terminated or revoked sooner.     Influenza A by PCR NEGATIVE NEGATIVE Final   Influenza B by PCR NEGATIVE NEGATIVE Final    Comment: (NOTE) The Xpert Xpress SARS-CoV-2/FLU/RSV plus assay is intended as an aid in the diagnosis of influenza from Nasopharyngeal swab specimens and should not be used as a sole basis for treatment. Nasal washings and aspirates are unacceptable for Xpert Xpress SARS-CoV-2/FLU/RSV testing.  Fact Sheet for Patients: BloggerCourse.com  Fact Sheet for Healthcare  Providers: SeriousBroker.it  This test is not yet approved or cleared by the [place] FDA and has been authorized for detection and/or diagnosis of SARS-CoV-2 by FDA under an Emergency Use Authorization (EUA). This EUA will remain in effect (meaning this test can be used) for the duration of the COVID-19 declaration under Section 564(b)(1) of the Act, 21 U.S.C. section 360bbb-3(b)(1), unless the authorization is terminated or revoked.  Performed at [facility], [address], [place], [place] [address]   Urine culture     Status: None   Collection Time: 08/31/20 10:52 PM   Specimen: In/Out Cath Urine  Result Value Ref Range Status   Specimen Description   Final    IN/OUT CATH URINE Performed at [facility], [address], [place], [place] [address]    Special Requests   Final    NONE Performed at [facility] [place], [address] [place], [place] [address]    Culture   Final    NO GROWTH Performed at [facility], [address], [place], [place] [address]    Report Status 09/01/2020 FINAL  Final  Blood Culture (routine x 2)     Status: Abnormal   Collection Time: 09/01/20 10:11 AM   Specimen: BLOOD LEFT HAND  Result Value Ref Range Status   Specimen Description   Final    BLOOD LEFT HAND Performed at [facility], [address] [place], [place] [address]    Special Requests   Final    BOTTLES DRAWN AEROBIC ONLY Blood Culture adequate volume Performed at [facility], [address], [place], [place] [address]    Culture  Setup Time   Final    GRAM POSITIVE COCCI IN CLUSTERS AEROBIC BOTTLE ONLY Organism ID to follow CRITICAL RESULT CALLED TO, READ BACK BY AND VERIFIED WITH: D. Wofford PharmD 15:45 09/03/20 ([email or web address])    Culture (A)  Final    STAPHYLOCOCCUS CAPITIS  THE SIGNIFICANCE OF ISOLATING THIS ORGANISM FROM A SINGLE SET OF BLOOD  CULTURES WHEN MULTIPLE SETS ARE DRAWN IS UNCERTAIN. PLEASE NOTIFY THE MICROBIOLOGY DEPARTMENT WITHIN ONE WEEK IF SPECIATION AND SENSITIVITIES ARE REQUIRED. Performed at [facility], [address] [place], [place] [address]    Report Status 09/04/2020 FINAL  Final  Blood Culture ID Panel (Reflexed)     Status: Abnormal   Collection Time: 09/01/20 10:11 AM  Result Value Ref Range Status   Enterococcus faecalis NOT DETECTED NOT DETECTED Final   Enterococcus Faecium NOT DETECTED NOT DETECTED Final   Listeria monocytogenes NOT DETECTED NOT DETECTED Final   Staphylococcus species DETECTED (A) NOT DETECTED Final    Comment: CRITICAL RESULT CALLED TO, READ BACK BY AND VERIFIED WITH: D. Wofford PharmD 15:45 09/03/20 ([email or web address])    Staphylococcus aureus (BCID) NOT DETECTED NOT DETECTED Final   Staphylococcus epidermidis NOT DETECTED NOT DETECTED Final   Staphylococcus lugdunensis NOT DETECTED NOT DETECTED Final   Streptococcus species NOT DETECTED NOT DETECTED Final   Streptococcus agalactiae NOT DETECTED NOT DETECTED Final   Streptococcus pneumoniae NOT DETECTED NOT DETECTED Final   Streptococcus pyogenes NOT DETECTED NOT DETECTED Final   A.calcoaceticus-baumannii NOT DETECTED NOT DETECTED Final   Bacteroides fragilis NOT DETECTED NOT DETECTED Final   Enterobacterales NOT DETECTED NOT DETECTED Final   Enterobacter cloacae complex NOT DETECTED NOT DETECTED Final   Escherichia coli NOT DETECTED NOT DETECTED Final   Klebsiella aerogenes NOT DETECTED NOT DETECTED Final   Klebsiella oxytoca NOT DETECTED NOT DETECTED Final   Klebsiella pneumoniae NOT DETECTED NOT DETECTED Final   Proteus species NOT DETECTED NOT DETECTED Final   Salmonella species NOT DETECTED NOT DETECTED Final   Serratia marcescens NOT DETECTED NOT DETECTED Final   Haemophilus influenzae NOT DETECTED NOT DETECTED Final   Neisseria meningitidis NOT DETECTED NOT DETECTED Final   Pseudomonas aeruginosa NOT DETECTED NOT  DETECTED Final   Stenotrophomonas maltophilia NOT DETECTED NOT DETECTED Final   Candida albicans NOT DETECTED NOT DETECTED Final   Candida auris NOT DETECTED NOT DETECTED Final   Candida glabrata NOT DETECTED NOT DETECTED Final   Candida krusei NOT DETECTED NOT DETECTED Final   Candida parapsilosis NOT DETECTED NOT DETECTED Final   Candida tropicalis NOT DETECTED NOT DETECTED Final   Cryptococcus neoformans/gattii NOT DETECTED NOT DETECTED Final    Comment: Performed at [facility], [address], [place], [place] [address]    Radiology Reports CT Head Wo Contrast  Result Date: 08/31/2020 CLINICAL DATA:  Altered mental status. EXAM: CT HEAD WITHOUT CONTRAST TECHNIQUE: Contiguous axial images were obtained from the base of the skull through the vertex without intravenous contrast. COMPARISON:  None. FINDINGS: Brain: Age related atrophy. Remote right frontal and periventricular infarct with ex vacuo dilatation of the right lateral ventricle. Small remote right occipital infarct. Moderate periventricular and deep white matter hypodensity typical of chronic small vessel ischemia. No hemorrhage or evidence of acute ischemia. No subdural or extra-axial collection. No midline shift or mass effect. Vascular: Atherosclerosis of skullbase vasculature without hyperdense vessel or abnormal calcification. Skull: No fracture or focal lesion. Sinuses/Orbits: Paranasal sinuses and mastoid air cells are clear. The visualized orbits are unremarkable. Right cataract resection. Other: None. IMPRESSION: 1. No acute intracranial abnormality. 2. Age related atrophy and chronic small vessel ischemia. Remote right frontal and periventricular infarcts. Small remote right occipital infarct. Electronically Signed   By: Narda Rutherford M.D.   On: 08/31/2020 21:17   DG Chest [facility]  1 View  Result Date: 08/31/2020 CLINICAL DATA:  Questionable sepsis EXAM: PORTABLE CHEST 1 VIEW COMPARISON:  05/19/2017 FINDINGS: Heart is  normal size. Aortic calcifications. No confluent opacities or effusions. No acute bony abnormality. IMPRESSION: No active disease. Electronically Signed   By: Charlett Nose M.D.   On: 08/31/2020 20:31   VAS [place] LOWER EXTREMITY VENOUS (DVT)  Result Date: 09/02/2020  Lower Venous DVT Study Indications: Covid-19, Elevated D-Dimer.  Limitations: Patient did not wish to cooperate with positioning. Refused to separate or rotate legs. Arterial calcification also noted causing shadowing. Comparison Study: No previous exam. Performing Technologist: Clint Guy  Examination Guidelines: A complete evaluation includes B-mode imaging, spectral Doppler, color Doppler, and power Doppler as needed of all accessible portions of each vessel. Bilateral testing is considered an integral part of a complete examination. Limited examinations for reoccurring indications may be performed as noted. The reflux portion of the exam is performed with the patient in reverse Trendelenburg.  +---------+---------------+---------+-----------+----------+--------------+ RIGHT    CompressibilityPhasicitySpontaneityPropertiesThrombus Aging +---------+---------------+---------+-----------+----------+--------------+ CFV      Full           Yes      Yes                                 +---------+---------------+---------+-----------+----------+--------------+ SFJ      Full                                                        +---------+---------------+---------+-----------+----------+--------------+ FV Prox  Full                                                        +---------+---------------+---------+-----------+----------+--------------+ FV Mid   Full                                                        +---------+---------------+---------+-----------+----------+--------------+ FV DistalFull                                                         +---------+---------------+---------+-----------+----------+--------------+ PFV      Full                                                        +---------+---------------+---------+-----------+----------+--------------+ POP      None                                         Acute          +---------+---------------+---------+-----------+----------+--------------+ PTV  Full                                                        +---------+---------------+---------+-----------+----------+--------------+   Right Technical Findings: Not visualized segments include Peroneal vessels not visualized.  +---------+---------------+---------+-----------+----------+--------------+ LEFT     CompressibilityPhasicitySpontaneityPropertiesThrombus Aging +---------+---------------+---------+-----------+----------+--------------+ CFV      Full           Yes      Yes                                 +---------+---------------+---------+-----------+----------+--------------+ SFJ      Full                                                        +---------+---------------+---------+-----------+----------+--------------+ FV Prox  Full                                                        +---------+---------------+---------+-----------+----------+--------------+ FV Mid   Full                                                        +---------+---------------+---------+-----------+----------+--------------+ FV DistalFull                                                        +---------+---------------+---------+-----------+----------+--------------+ PFV      Full                                                        +---------+---------------+---------+-----------+----------+--------------+ PTV      Full                                                        +---------+---------------+---------+-----------+----------+--------------+   Left Technical Findings: Not  visualized segments include Popliteal and Peroneal vessels not visualized.   Summary: RIGHT: - Findings consistent with acute deep vein thrombosis involving the right popliteal vein. - No cystic structure found in the popliteal fossa.  LEFT: - There is no evidence of deep vein thrombosis in the lower extremity.  - No cystic structure found in the popliteal fossa.  *See table(s) above for measurements and observations. Electronically signed by Coral ElseVance Brabham MD on 09/02/2020 at 10:06:25 PM.    Final    US EKG  SITE RITE  Result Date: 09/03/2020 If Site Rite image not attached, placement could not be confirmed due to current cardiac rhythm.

## 2020-09-04 NOTE — Progress Notes (Signed)
Patient O2 sat 95% and decreased O2 to 1L via [place]. Patient is asleep. Will continue to monitor the patient.

## 2020-09-04 NOTE — Progress Notes (Signed)
Patient complaining of nausea, difficulty breathing, generalized aching, agitation, and removed O2 sensor from finger. O2 sat 83 on RA. Gave 3 L of O2 via [place], zofran for the nausea, reapplied new O2 sensor, administered tylenol for pain, and repositioned the patient. Will continue to monitor the patient.

## 2020-09-04 NOTE — TOC Progression Note (Signed)
Transition of Care [facility]) - Progression Note    Patient Details  Name: Deneisha Dade MRN: [id number] Date of Birth: [birth year]  Transition of Care [facility]) CM/SW Contact  Ida Rogue, [place] Phone Number: 09/04/2020, 10:02 AM  Clinical Narrative:  Spoke with caregiver, Ms Tiburcio Pea,  by phone outside her ICU room.  Found her to be engageable, alert and oriented.  She wondered how she would be here, and how long Ms Utke would be here.  Told her Ms Macconnell is not yet medically stable, and neither is she, but beyond that I am unable to answer the question.  I suggested I send out Ms Glore' information to [facility] and [facility] in hopes of getting a bed offer so we have a back up plan.  She liked that idea, stated [facility].  Beds search initiated. TOC will continue to follow during the course of hospitalization.     Expected Discharge Plan: Skilled Nursing Facility Barriers to Discharge: SNF Pending bed offer  Expected Discharge Plan and Services Expected Discharge Plan: Skilled Nursing Facility                                               Social Determinants of Health (SDOH) Interventions    Readmission Risk Interventions No flowsheet data found.

## 2020-09-04 NOTE — Care Management Important Message (Signed)
Important Message  Patient Details IM Letter given to the Patient. Name: Kaitlin Smith MRN: [id number] Date of Birth: 1929-09-29   Medicare Important Message Given:  Yes     Caren Macadam 09/04/2020, 10:48 AM

## 2020-09-04 NOTE — [place] FL2 (Signed)
[place] MEDICAID FL2 LEVEL OF CARE SCREENING TOOL     IDENTIFICATION  Patient Name: Kaitlin Smith Birthdate: September 13, 1930 Sex: female Admission Date (Current Location): 08/31/2020  [facility] and [place] Number:  Producer, television/film/video and Address:  [facility],  [address], [place] [address]      Provider Number: [id number]  Attending Physician Name and Address:  Starleen Arms, MD  Relative Name and Phone Number:  [name] (Niece)   [phone]    Current Level of Care:   Recommended Level of Care: Nursing Facility Prior Approval Number:    Date Approved/Denied:   PASRR Number: [id number]  Discharge Plan: SNF    Current Diagnoses: Patient Active Problem List   Diagnosis Date Noted  . Controlled type 2 diabetes mellitus with hyperglycemia (HCC) 09/01/2020  . COVID 09/01/2020  . Acute lower UTI 09/01/2020  . Acute hypoxemic respiratory failure due to COVID-19 (HCC) 09/01/2020  . COVID-19 virus infection 08/31/2020  . Aortic dissection (HCC) 08/17/2018  . Hypertensive emergency 08/17/2018  . Abnormal EKG 01/30/2018  . Osteoarthritis of right knee 12/04/2017  . MCI (mild cognitive impairment) 06/30/2015  . Essential hypertension 08/20/2014  . Hyperlipidemia 08/20/2014  . Peripheral arterial disease (HCC) 08/20/2014  . Carotid artery disease (HCC) 06/06/2014  . Uncontrolled diabetes mellitus type 2 without complications 05/13/2014  . History of CVA (cerebrovascular accident) 05/13/2014  . History of MI (myocardial infarction) 05/13/2014    Orientation RESPIRATION BLADDER Height & Weight     Self  O2 (2L [place]) External catheter Weight: 62.6 kg Height:  5\' 5"  (165.1 cm)  BEHAVIORAL SYMPTOMS/MOOD NEUROLOGICAL BOWEL NUTRITION STATUS   (none)  (none) Continent Diet (see d/c summary)  AMBULATORY STATUS COMMUNICATION OF NEEDS Skin   Extensive Assist Verbally Normal                       Personal Care Assistance Level of Assistance   Bathing,Feeding,Dressing Bathing Assistance: Maximum assistance Feeding assistance: Limited assistance Dressing Assistance: Maximum assistance     Functional Limitations Info  Sight,Hearing,Speech Sight Info: Impaired Hearing Info: Impaired Speech Info: Adequate    SPECIAL CARE FACTORS FREQUENCY  PT (By licensed PT),OT (By licensed OT)     PT Frequency: 5X/W OT Frequency: 5X/W            Contractures Contractures Info: Not present    Additional Factors Info  Code Status,Allergies Code Status Info: DNR Allergies Info: Milk-related           Current Medications (09/04/2020):  This is the current hospital active medication list Current Facility-Administered Medications  Medication Dose Route Frequency Provider Last Rate Last Admin  . 0.9 %  sodium chloride infusion   Intravenous Continuous Elgergawy, 09/06/2020, MD 50 mL/hr at 09/03/20 0559 Infusion Verify at 09/03/20 0559  . acetaminophen (TYLENOL) tablet 650 mg  650 mg Oral Q6H PRN 09/05/20, MD   650 mg at 09/04/20 1008   Or  . acetaminophen (TYLENOL) suppository 650 mg  650 mg Rectal Q6H PRN 09/06/20, MD      . Chlorhexidine Gluconate Cloth 2 % PADS 6 each  6 each Topical Daily Elgergawy, Eduard Clos, MD   6 each at 09/04/20 1003  . enoxaparin (LOVENOX) injection 60 mg  60 mg Subcutaneous Q12H 09/06/20, RPH   60 mg at 09/04/20 1003  . feeding supplement (ENSURE ENLIVE / ENSURE PLUS) liquid 237 mL  237 mL Oral BID BM Elgergawy, 09/06/20  S, MD   237 mL at 09/04/20 1003  . labetalol (NORMODYNE) injection 10 mg  10 mg Intravenous Q2H PRN Eduard Clos, MD      . methylPREDNISolone sodium succinate (SOLU-MEDROL) 40 mg/mL injection 40 mg  40 mg Intravenous Q12H Elgergawy, Leana Roe, MD      . ondansetron (ZOFRAN) injection 4 mg  4 mg Intravenous Q8H PRN Audrea Muscat T, NP   4 mg at 09/04/20 0434  . remdesivir 100 mg in sodium chloride 0.9 % 100 mL IVPB  100 mg Intravenous Daily Rollene Fare, RPH 200 mL/hr at 09/04/20 1005 100 mg at 09/04/20 1005  . sodium chloride flush (NS) 0.9 % injection 10-40 mL  10-40 mL Intracatheter PRN Elgergawy, Leana Roe, MD         Discharge Medications: Please see discharge summary for a list of discharge medications.  Relevant Imaging Results:  Relevant Lab Results:   Additional Information [phone] [place], [place]

## 2020-09-05 DIAGNOSIS — A419 Sepsis, unspecified organism: Secondary | ICD-10-CM

## 2020-09-05 DIAGNOSIS — R6520 Severe sepsis without septic shock: Secondary | ICD-10-CM

## 2020-09-05 LAB — CULTURE, BLOOD (ROUTINE X 2)
Culture: NO GROWTH
Special Requests: ADEQUATE

## 2020-09-05 LAB — COMPREHENSIVE METABOLIC PANEL
ALT: 23 U/L (ref 0–44)
AST: 34 U/L (ref 15–41)
Albumin: 2.1 g/dL — ABNORMAL LOW (ref 3.5–5.0)
Alkaline Phosphatase: 38 U/L (ref 38–126)
Anion gap: 8 (ref 5–15)
BUN: 17 mg/dL (ref 8–23)
CO2: 26 mmol/L (ref 22–32)
Calcium: 7.8 mg/dL — ABNORMAL LOW (ref 8.9–10.3)
Chloride: 110 mmol/L (ref 98–111)
Creatinine, Ser: 0.58 mg/dL (ref 0.44–1.00)
GFR, Estimated: >60 mL/min (ref >60)
Glucose, Bld: 131 mg/dL — ABNORMAL HIGH (ref 70–99)
Potassium: 4.1 mmol/L (ref 3.5–5.1)
Sodium: 144 mmol/L (ref 135–145)
Total Bilirubin: 0.8 mg/dL (ref 0.3–1.2)
Total Protein: 4.9 g/dL — ABNORMAL LOW (ref 6.5–8.1)

## 2020-09-05 LAB — CBC WITH DIFFERENTIAL/PLATELET
Abs Immature Granulocytes: 0.49 10*3/uL — ABNORMAL HIGH (ref 0.00–0.07)
Basophils Absolute: 0 10*3/uL (ref 0.0–0.1)
Basophils Relative: 1 %
Eosinophils Absolute: 0 10*3/uL (ref 0.0–0.5)
Eosinophils Relative: 0 %
HCT: 38.7 % (ref 36.0–46.0)
Hemoglobin: 12.6 g/dL (ref 12.0–15.0)
Immature Granulocytes: 6 %
Lymphocytes Relative: 8 %
Lymphs Abs: 0.7 10*3/uL (ref 0.7–4.0)
MCH: 31.3 pg (ref 26.0–34.0)
MCHC: 32.6 g/dL (ref 30.0–36.0)
MCV: 96.3 fL (ref 80.0–100.0)
Monocytes Absolute: 0.5 10*3/uL (ref 0.1–1.0)
Monocytes Relative: 6 %
Neutro Abs: 6.8 10*3/uL (ref 1.7–7.7)
Neutrophils Relative %: 79 %
Platelets: 207 10*3/uL (ref 150–400)
RBC: 4.02 MIL/uL (ref 3.87–5.11)
RDW: 12.9 % (ref 11.5–15.5)
WBC: 8.5 10*3/uL (ref 4.0–10.5)
nRBC: 0 % (ref 0.0–0.2)

## 2020-09-05 LAB — D-DIMER, QUANTITATIVE: D-Dimer, Quant: 0.82 ug/mL-FEU — ABNORMAL HIGH (ref 0.00–0.50)

## 2020-09-05 LAB — C-REACTIVE PROTEIN: CRP: 2.2 mg/dL — ABNORMAL HIGH (ref <1.0)

## 2020-09-05 NOTE — Progress Notes (Signed)
1600- patient continues taking off nasal cannula and O2 saturations dropping to 80%. RN re-educated patient regarding the importance of using nasal cannula. RN also made patient's niece, [name], aware of situation. Dr. Elvera Lennox made aware and suggested using a simple mask if patient continues to take off nasal cannula.

## 2020-09-05 NOTE — Progress Notes (Signed)
PROGRESS NOTE  Kaitlin Smith ZOX:[id number] DOB: 1930/01/07 DOA: 08/31/2020 PCP: Alysia Penna, MD   LOS: 4 days   Brief Narrative / Interim history: 86 y.o.femalewithhistory of dementia, diabetes mellitus type 2, hypertension stroke peripheral vascular disease abdominal aortic aneurysm was found to be weak unable to get up from a recliner over the last 12 hours and was brought to the ER. Patient's family member has been tested positive for Covid infection as per the report. Patient unable to provide a good history but denies any chest pain or shortness of breath abdominal pain nausea vomiting or diarrhea.  She was febrile to 103 in the ED, had elevated lactic acid, she had a UTI and was also positive for Covid.  She was not hypoxic and chest x-ray did not show any infiltrates.  Subjective / 24h Interval events: 9 dementia, has no complaints for me this morning  Assessment & Plan:  Principal Problem Acute Hypoxic Respiratory Failure due to Covid-19 Viral Illness -Patient remains hypoxic, briefly went up to 5 L last night but back down to 2 L this morning -Continue steroids, Remdesivir, trend inflammatory markers -Procalcitonin was low   COVID-19 Labs  Recent Labs    09/03/20 0814 09/04/20 0636 09/05/20 0355  DDIMER  --  0.85* 0.82*  CRP 11.6* 5.1*  --     Lab Results  Component Value Date   SARSCOV2NAA POSITIVE (A) 08/31/2020    Active Problems Acute DVT -Had an elevated D-dimer on admission, lower extremity Doppler with DVT.  Continue Lovenox, still tolerating it well  UTI -Completed ceftriaxone, urine cultures without growth  1/4 staph capitis in blood cultures -Likely contaminant  Lymphopenia, thrombocytopenia -Due to Covid, now resolved  Generalized weakness -Due to Covid, deconditioning, SNF recommended and placement pending  Acute kidney injury -Resolved with IV fluids  Dementia -Stable, no behavioral disturbances  Scheduled Meds: .  Chlorhexidine Gluconate Cloth  6 each Topical Daily  . enoxaparin (LOVENOX) injection  60 mg Subcutaneous Q12H  . feeding supplement  237 mL Oral BID BM  . methylPREDNISolone (SOLU-MEDROL) injection  40 mg Intravenous Q12H  . pantoprazole  40 mg Oral Daily   Continuous Infusions: . sodium chloride 50 mL/hr at 09/04/20 2020  . remdesivir 100 mg in NS 100 mL 100 mg (09/05/20 1012)   PRN Meds:.acetaminophen **OR** acetaminophen, labetalol, ondansetron (ZOFRAN) IV, sodium chloride flush  DVT prophylaxis: Lovenox Code Status: DNR Family Communication: will call family this afetrnoon   Status is: Inpatient  Remains inpatient appropriate because:Inpatient level of care appropriate due to severity of illness   Dispo: The patient is from: Home              Anticipated d/c is to: SNF              Anticipated d/c date is: 2 days              Patient currently is not medically stable to d/c.  Consultants:  None   Procedures:  None   Microbiology: None   Antibacterials: None    Objective: Vitals:   09/05/20 0100 09/05/20 0429 09/05/20 0603 09/05/20 0629  BP:  (!) 150/80    Pulse:  74    Resp:  19    Temp:  97.9 F (36.6 C)    TempSrc:  Oral    SpO2: 94% 100% 98% 95%  Weight:      Height:        Intake/Output Summary (Last 24 hours) at 09/05/2020 1136  Last data filed at 09/05/2020 0500 Gross per 24 hour  Intake 1443.21 ml  Output 120 ml  Net 1323.21 ml   Filed Weights   08/31/20 1934  Weight: 62.6 kg    Examination:  Constitutional: NAD Eyes: no scleral icterus ENMT: Mucous membranes are moist.  Neck: normal, supple Respiratory: clear to auscultation bilaterally, no wheezing, no crackles. Normal respiratory effort. Cardiovascular: Regular rate and rhythm, no murmurs / rubs / gallops. No LE edema. Good peripheral pulses Abdomen: non distended, no tenderness. Bowel sounds positive.  Musculoskeletal: no clubbing / cyanosis.  Skin: no rashes Neurologic: non  focal    Data Reviewed: I have independently reviewed following labs and imaging studies   CBC: Recent Labs  Lab 08/31/20 2010 09/01/20 0610 09/02/20 0359 09/04/20 0636 09/05/20 0355  WBC 6.6 4.8 5.7 7.2 8.5  NEUTROABS 6.0  --  4.4 5.9 6.8  HGB 16.5* 14.4 14.3 12.6 12.6  HCT 51.0* 45.1 45.5 38.6 38.7  MCV 97.3 97.0 98.9 95.3 96.3  PLT 179 141* 141* 213 207   Basic Metabolic Panel: Recent Labs  Lab 08/31/20 2010 09/01/20 0610 09/02/20 0359 09/04/20 0636 09/05/20 0355  NA 139  --  142 144 144  K 4.4  --  3.6 3.9 4.1  CL 103  --  105 109 110  CO2 24  --  22 26 26   GLUCOSE 114*  --  79 155* 131*  BUN 31*  --  10 22 17   CREATININE 1.05* 0.67 0.68 0.54 0.58  CALCIUM 9.0  --  8.0* 8.1* 7.8*   GFR: Estimated Creatinine Clearance: 43.7 mL/min (by C-G formula based on SCr of 0.58 mg/dL). Liver Function Tests: Recent Labs  Lab 08/31/20 2010 09/02/20 0359 09/04/20 0636 09/05/20 0355  AST 51* 53* 27 34  ALT 23 17 16 23   ALKPHOS 66 48 38 38  BILITOT 1.6* 1.2 0.9 0.8  PROT 8.0 6.0* 5.2* 4.9*  ALBUMIN 3.8 2.8* 2.2* 2.1*   No results for input(s): LIPASE, AMYLASE in the last 168 hours. No results for input(s): AMMONIA in the last 168 hours. Coagulation Profile: Recent Labs  Lab 08/31/20 2010  INR 1.0   Cardiac Enzymes: No results for input(s): CKTOTAL, CKMB, CKMBINDEX, TROPONINI in the last 168 hours. BNP (last 3 results) No results for input(s): PROBNP in the last 8760 hours. HbA1C: No results for input(s): HGBA1C in the last 72 hours. CBG: Recent Labs  Lab 09/01/20 1654 09/01/20 2117 09/02/20 1711 09/03/20 0808 09/03/20 1201  GLUCAP 87 81 88 140* 151*   Lipid Profile: No results for input(s): CHOL, HDL, LDLCALC, TRIG, CHOLHDL, LDLDIRECT in the last 72 hours. Thyroid Function Tests: No results for input(s): TSH, T4TOTAL, FREET4, T3FREE, THYROIDAB in the last 72 hours. Anemia Panel: No results for input(s): VITAMINB12, FOLATE, FERRITIN, TIBC, IRON,  RETICCTPCT in the last 72 hours. Urine analysis:    Component Value Date/Time   COLORURINE AMBER (A) 08/31/2020 2252   APPEARANCEUR CLEAR 08/31/2020 2252   LABSPEC 1.018 08/31/2020 2252   PHURINE 5.0 08/31/2020 2252   GLUCOSEU NEGATIVE 08/31/2020 2252   HGBUR SMALL (A) 08/31/2020 2252   BILIRUBINUR NEGATIVE 08/31/2020 2252   BILIRUBINUR neg 07/18/2014 1320   KETONESUR NEGATIVE 08/31/2020 2252   PROTEINUR 30 (A) 08/31/2020 2252   UROBILINOGEN 0.2 07/18/2014 1320   UROBILINOGEN 0.2 06/14/2014 1236   NITRITE NEGATIVE 08/31/2020 2252   LEUKOCYTESUR MODERATE (A) 08/31/2020 2252   Sepsis Labs: Invalid input(s): PROCALCITONIN, LACTICIDVEN  Recent Results (from the past 240  hour(s))  Blood Culture (routine x 2)     Status: None (Preliminary result)   Collection Time: 08/31/20  8:10 PM   Specimen: BLOOD  Result Value Ref Range Status   Specimen Description   Final    BLOOD LEFT ANTECUBITAL Performed at [facility], [address] [place], [place] [address]    Special Requests   Final    BOTTLES DRAWN AEROBIC AND ANAEROBIC Blood Culture adequate volume Performed at [facility], [address] [place], [place] [address]    Culture   Final    NO GROWTH 4 DAYS Performed at [facility], [address] [place], [place] [address]    Report Status PENDING  Incomplete  Resp Panel by RT-PCR (Flu A&B, Covid) Nasopharyngeal Swab     Status: Abnormal   Collection Time: 08/31/20  8:11 PM   Specimen: Nasopharyngeal Swab; Nasopharyngeal(NP) swabs in vial transport medium  Result Value Ref Range Status   SARS Coronavirus 2 by RT PCR POSITIVE (A) NEGATIVE Final    Comment: LACIVITA H. 12.12.21 @ 2321 BY MECIAL J. (NOTE) SARS-CoV-2 target nucleic acids are DETECTED.  The SARS-CoV-2 RNA is generally detectable in upper respiratory specimens during the acute phase of infection. Positive results are indicative of the presence of the identified  virus, but do not rule out bacterial infection or co-infection with other pathogens not detected by the test. Clinical correlation with patient history and other diagnostic information is necessary to determine patient infection status. The expected result is Negative.  Fact Sheet for Patients: BloggerCourse.comhttps://www.fda.gov/media/152166/download  Fact Sheet for Healthcare Providers: SeriousBroker.ithttps://www.fda.gov/media/152162/download  This test is not yet approved or cleared by the [place] FDA and  has been authorized for detection and/or diagnosis of SARS-CoV-2 by FDA under an Emergency Use Authorization (EUA).  This EUA will remain in effect (meaning this test can be used) for the duration of  the COVID-19 de claration under Section 564(b)(1) of the Act, 21 U.S.C. section 360bbb-3(b)(1), unless the authorization is terminated or revoked sooner.     Influenza A by PCR NEGATIVE NEGATIVE Final   Influenza B by PCR NEGATIVE NEGATIVE Final    Comment: (NOTE) The Xpert Xpress SARS-CoV-2/FLU/RSV plus assay is intended as an aid in the diagnosis of influenza from Nasopharyngeal swab specimens and should not be used as a sole basis for treatment. Nasal washings and aspirates are unacceptable for Xpert Xpress SARS-CoV-2/FLU/RSV testing.  Fact Sheet for Patients: BloggerCourse.comhttps://www.fda.gov/media/152166/download  Fact Sheet for Healthcare Providers: SeriousBroker.ithttps://www.fda.gov/media/152162/download  This test is not yet approved or cleared by the [place] FDA and has been authorized for detection and/or diagnosis of SARS-CoV-2 by FDA under an Emergency Use Authorization (EUA). This EUA will remain in effect (meaning this test can be used) for the duration of the COVID-19 declaration under Section 564(b)(1) of the Act, 21 U.S.C. section 360bbb-3(b)(1), unless the authorization is terminated or revoked.  Performed at [facility], [address] [place], [place] [address]   Urine  culture     Status: None   Collection Time: 08/31/20 10:52 PM   Specimen: In/Out Cath Urine  Result Value Ref Range Status   Specimen Description   Final    IN/OUT CATH URINE Performed at [facility], [address] [place], [place] [address]    Special Requests   Final    NONE Performed at [facility], [address] [place], [place] [address]    Culture  Final    NO GROWTH Performed at [facility], [address] [place], [place] [address]    Report Status 09/01/2020 FINAL  Final  Blood Culture (routine x 2)     Status: Abnormal   Collection Time: 09/01/20 10:11 AM   Specimen: BLOOD LEFT HAND  Result Value Ref Range Status   Specimen Description   Final    BLOOD LEFT HAND Performed at [facility], [address] [place], [place] [address]    Special Requests   Final    BOTTLES DRAWN AEROBIC ONLY Blood Culture adequate volume Performed at [facility], [address] [place], [place] [address]    Culture  Setup Time   Final    GRAM POSITIVE COCCI IN CLUSTERS AEROBIC BOTTLE ONLY Organism ID to follow CRITICAL RESULT CALLED TO, READ BACK BY AND VERIFIED WITH: D. Wofford PharmD 15:45 09/03/20 ([email or web address])    Culture (A)  Final    STAPHYLOCOCCUS CAPITIS THE SIGNIFICANCE OF ISOLATING THIS ORGANISM FROM A SINGLE SET OF BLOOD CULTURES WHEN MULTIPLE SETS ARE DRAWN IS UNCERTAIN. PLEASE NOTIFY THE MICROBIOLOGY DEPARTMENT WITHIN ONE WEEK IF SPECIATION AND SENSITIVITIES ARE REQUIRED. Performed at [facility], [address] [place], [place] [address]    Report Status 09/04/2020 FINAL  Final  Blood Culture ID Panel (Reflexed)     Status: Abnormal   Collection Time: 09/01/20 10:11 AM  Result Value Ref Range Status   Enterococcus faecalis NOT DETECTED NOT DETECTED Final   Enterococcus Faecium NOT DETECTED NOT DETECTED Final   Listeria monocytogenes NOT DETECTED NOT DETECTED Final    Staphylococcus species DETECTED (A) NOT DETECTED Final    Comment: CRITICAL RESULT CALLED TO, READ BACK BY AND VERIFIED WITH: D. Wofford PharmD 15:45 09/03/20 ([email or web address])    Staphylococcus aureus (BCID) NOT DETECTED NOT DETECTED Final   Staphylococcus epidermidis NOT DETECTED NOT DETECTED Final   Staphylococcus lugdunensis NOT DETECTED NOT DETECTED Final   Streptococcus species NOT DETECTED NOT DETECTED Final   Streptococcus agalactiae NOT DETECTED NOT DETECTED Final   Streptococcus pneumoniae NOT DETECTED NOT DETECTED Final   Streptococcus pyogenes NOT DETECTED NOT DETECTED Final   A.calcoaceticus-baumannii NOT DETECTED NOT DETECTED Final   Bacteroides fragilis NOT DETECTED NOT DETECTED Final   Enterobacterales NOT DETECTED NOT DETECTED Final   Enterobacter cloacae complex NOT DETECTED NOT DETECTED Final   Escherichia coli NOT DETECTED NOT DETECTED Final   Klebsiella aerogenes NOT DETECTED NOT DETECTED Final   Klebsiella oxytoca NOT DETECTED NOT DETECTED Final   Klebsiella pneumoniae NOT DETECTED NOT DETECTED Final   Proteus species NOT DETECTED NOT DETECTED Final   Salmonella species NOT DETECTED NOT DETECTED Final   Serratia marcescens NOT DETECTED NOT DETECTED Final   Haemophilus influenzae NOT DETECTED NOT DETECTED Final   Neisseria meningitidis NOT DETECTED NOT DETECTED Final   Pseudomonas aeruginosa NOT DETECTED NOT DETECTED Final   Stenotrophomonas maltophilia NOT DETECTED NOT DETECTED Final   Candida albicans NOT DETECTED NOT DETECTED Final   Candida auris NOT DETECTED NOT DETECTED Final   Candida glabrata NOT DETECTED NOT DETECTED Final   Candida krusei NOT DETECTED NOT DETECTED Final   Candida parapsilosis NOT DETECTED NOT DETECTED Final   Candida tropicalis NOT DETECTED NOT DETECTED Final   Cryptococcus neoformans/gattii NOT DETECTED NOT DETECTED Final    Comment: Performed at [facility], [address], [place], [place] [address]      Radiology Studies: [place] EKG  SITE RITE  Result Date: 09/03/2020 If Site Rite image not attached, placement could not be confirmed due to current cardiac rhythm.   Pamella Pert, MD, PhD [facility]  Between 7 am - 7 pm I am available, please contact me via Amion or Securechat  Between 7 pm - 7 am I am not available, please contact night coverage MD/APP via Amion

## 2020-09-05 NOTE — Progress Notes (Signed)
ANTICOAGULATION CONSULT NOTE  Pharmacy Consult for Lovenox Indication: DVT  Allergies  Allergen Reactions  . Clonidine Derivatives Other (See Comments)    Pass out; makes her blood pressure too low  . Milk-Related Compounds Other (See Comments)    Lactose intolerant.      Patient Measurements: Height: 5\' 5"  (165.1 cm) Weight: 62.6 kg (138 lb 0.1 oz) IBW/kg (Calculated) : 57  Vital Signs: Temp: 97.9 F (36.6 C) (12/18 0429) Temp Source: Oral (12/18 0429) BP: 150/80 (12/18 0429) Pulse Rate: 74 (12/18 0429)  Labs: Recent Labs    09/04/20 0636 09/05/20 0355  HGB 12.6 12.6  HCT 38.6 38.7  PLT 213 207  CREATININE 0.54 0.58    Estimated Creatinine Clearance: 43.7 mL/min (by C-G formula based on SCr of 0.58 mg/dL).   Medical History: Past Medical History:  Diagnosis Date  . Allergy   . Arthritis   . Blindness   . Coronary artery disease   . Diabetes mellitus without complication (HCC)   . Glaucoma   . Hyperlipidemia   . Hypertension   . Myocardial infarction (HCC)   . Peripheral vascular disease (HCC)   . Stroke [facility])     Medications:  Scheduled:  . Chlorhexidine Gluconate Cloth  6 each Topical Daily  . enoxaparin (LOVENOX) injection  60 mg Subcutaneous Q12H  . feeding supplement  237 mL Oral BID BM  . methylPREDNISolone (SOLU-MEDROL) injection  40 mg Intravenous Q12H  . pantoprazole  40 mg Oral Daily   Infusions:  . sodium chloride 50 mL/hr at 09/04/20 2020  . remdesivir 100 mg in NS 100 mL Stopped (09/04/20 1040)   PRN: acetaminophen **OR** acetaminophen, labetalol, ondansetron (ZOFRAN) IV, sodium chloride flush  Assessment: 86 yo female admitted with COVID pneumonia.  D-dimer elevated, doppler +DVT, pharmacy consulted to dose Lovenox.  09/05/20 10:12 AM   SCr stable  CBC stable  No bleeding reported  Goal of Therapy:  Anti-Xa level 0.6-1 units/ml 4hrs after LMWH dose given Monitor platelets by anticoagulation protocol: Yes   Plan:   Continue Lovenox 1mg /kg SQ q12h Monitor CBC, renal function, signs/symptoms of bleeding Will sign off and follow remotely  Thank you for the consult  09/07/20 D  09/05/2020,10:12 AM

## 2020-09-05 NOTE — Progress Notes (Signed)
Rapid Response Event Note   Reason for Call : Increased O2 needs  Initial Focused Assessment:  Pt alert and oriented to self and place. Knows current president but not current month. Pt needs assistance and prompting for feeding/drinking. Hx of dementia, (+) R popliteal DVT on admission. Currently on antibiotics for UTI/Covid.  Interventions:   Pt c/o nausea and H/A. RN administered relevent PRNs. Lung sounds: L clear, R fine crackles. No edema seen on uppers/lower extremities. Decreased urine output per NT/RN. Pt safety mitts on d/t pt frequently taking off O2 [place]  Plan of Care:  Advised RN to keep pt side lying to promote oxygenation. RN reached out to [facility] NP Bruna Potter) regarding R sided crackles. Advised RN to talked to [facility] about possibly recollecting D-Dimer (last check 0.85 on 12/17)  Event Summary:  Rounded on pt @ 0000, O2 say 98%. Pt sleeping peacefully on 6L [place]   MD Notified: Blout, NP Call Time: 12/17 @ 2223p Arrival Time: 2245p End Time: 2320p  Elliot Cousin, RN

## 2020-09-05 NOTE — Progress Notes (Addendum)
During 10 pm 09/04/20 rounding, RN noticed desaturation, O2 sat mid to low 80s. RN increased O2 to 4L from 2L, with minimal improvement. Patient c/o nausea and SOB. Vital signs checked. O2 increased to 6L [place]. Saturation improved to high 80s. Rapid Nurse and Covering NP notified regarding O2 requirement.   Rapid Nurse Denny Peon ) came and assessed patient. Crackles noted on right side of the chest. Mittens were also placed on both hands, patient kept on removing O2 cannula.

## 2020-09-05 NOTE — Plan of Care (Signed)
Alert and oriented to self and placed. Mittens on both hands in placed, patient kep on removing O2 cannula. O2 requirement increased overnight due to desaturation and SOB. Zofran prn given for nausea. Tylenol prn given for headache. Incontinent of bowel and bladder. Purewick in placed. Will continue to monitor.  Problem: Education: Goal: Knowledge of General Education information will improve Description: Including pain rating scale, medication(s)/side effects and non-pharmacologic comfort measures Outcome: Progressing   Problem: Health Behavior/Discharge Planning: Goal: Ability to manage health-related needs will improve Outcome: Progressing   Problem: Clinical Measurements: Goal: Ability to maintain clinical measurements within normal limits will improve Outcome: Progressing Goal: Will remain free from infection Outcome: Progressing Goal: Diagnostic test results will improve Outcome: Progressing Goal: Respiratory complications will improve Outcome: Progressing Goal: Cardiovascular complication will be avoided Outcome: Progressing   Problem: Activity: Goal: Risk for activity intolerance will decrease Outcome: Progressing   Problem: Nutrition: Goal: Adequate nutrition will be maintained Outcome: Progressing   Problem: Coping: Goal: Level of anxiety will decrease Outcome: Progressing   Problem: Elimination: Goal: Will not experience complications related to bowel motility Outcome: Progressing Goal: Will not experience complications related to urinary retention Outcome: Progressing   Problem: Pain Managment: Goal: General experience of comfort will improve Outcome: Progressing   Problem: Safety: Goal: Ability to remain free from injury will improve Outcome: Progressing   Problem: Skin Integrity: Goal: Risk for impaired skin integrity will decrease Outcome: Progressing   Problem: Education: Goal: Knowledge of risk factors and measures for prevention of condition  will improve Outcome: Progressing   Problem: Coping: Goal: Psychosocial and spiritual needs will be supported Outcome: Progressing   Problem: Respiratory: Goal: Will maintain a patent airway Outcome: Progressing Goal: Complications related to the disease process, condition or treatment will be avoided or minimized Outcome: Progressing

## 2020-09-06 ENCOUNTER — Inpatient Hospital Stay (HOSPITAL_COMMUNITY): Payer: Medicare Other

## 2020-09-06 LAB — D-DIMER, QUANTITATIVE: D-Dimer, Quant: >20 ug/mL-FEU — ABNORMAL HIGH (ref 0.00–0.50)

## 2020-09-06 NOTE — Progress Notes (Signed)
PROGRESS NOTE  Ascencion DikeLannie All ZOX:[id number] DOB: 21-Apr-1930 DOA: 08/31/2020 PCP: Alysia PennaHolwerda, Scott, MD   LOS: 5 days   Brief Narrative / Interim history: 86 y.o.femalewithhistory of dementia, diabetes mellitus type 2, hypertension stroke peripheral vascular disease abdominal aortic aneurysm was found to be weak unable to get up from a recliner over the last 12 hours and was brought to the ER. Patient's family member has been tested positive for Covid infection as per the report. Patient unable to provide a good history but denies any chest pain or shortness of breath abdominal pain nausea vomiting or diarrhea.  She was febrile to 103 in the ED, had elevated lactic acid, she had a UTI and was also positive for Covid.  She was not hypoxic and chest x-ray did not show any infiltrates.  Subjective / 24h Interval events: No significant complaints, denies any shortness of breath  Assessment & Plan:  Principal Problem Acute Hypoxic Respiratory Failure due to Covid-19 Viral Illness -P remains minimally hypoxic currently on couple liters of oxygen -Continue steroids, Remdesivir, trend inflammatory markers, overall improving -Procalcitonin was low   COVID-19 Labs  Recent Labs    09/04/20 0636 09/05/20 0355 09/05/20 1210  DDIMER 0.85* 0.82*  --   CRP 5.1*  --  2.2*    Lab Results  Component Value Date   SARSCOV2NAA POSITIVE (A) 08/31/2020    Active Problems Acute DVT -Had an elevated D-dimer on admission, lower extremity Doppler with DVT.  Patient was placed on anticoagulation with Lovenox, continue  UTI -Completed ceftriaxone, urine cultures without growth  1/4 staph capitis in blood cultures -Likely contaminant  Lymphopenia, thrombocytopenia -Due to Covid, now resolved  Generalized weakness -Due to Covid, deconditioning, SNF recommended, placement pending, TOC following  Acute kidney injury -Resolved with IV fluids  Dementia -Stable, no behavioral  disturbances  Scheduled Meds: . Chlorhexidine Gluconate Cloth  6 each Topical Daily  . enoxaparin (LOVENOX) injection  60 mg Subcutaneous Q12H  . feeding supplement  237 mL Oral BID BM  . methylPREDNISolone (SOLU-MEDROL) injection  40 mg Intravenous Q12H  . pantoprazole  40 mg Oral Daily   Continuous Infusions: . sodium chloride 50 mL/hr at 09/05/20 2119   PRN Meds:.acetaminophen **OR** acetaminophen, labetalol, ondansetron (ZOFRAN) IV, sodium chloride flush  DVT prophylaxis: Lovenox Code Status: DNR Family Communication: Discussed with nephew over the phone   Status is: Inpatient  Remains inpatient appropriate because:Inpatient level of care appropriate due to severity of illness   Dispo: The patient is from: Home              Anticipated d/c is to: SNF              Anticipated d/c date is: 2 days              Patient currently is not medically stable to d/c.  Consultants:  None   Procedures:  None   Microbiology: None   Antibacterials: None    Objective: Vitals:   09/05/20 1000 09/05/20 1731 09/05/20 2134 09/06/20 0459  BP:   (!) 162/86 (!) 151/98  Pulse:   74 96  Resp:   17 18  Temp:   98.2 F (36.8 C) 98.2 F (36.8 C)  TempSrc:   Oral Oral  SpO2: (!) 89% 92% 99% 96%  Weight:      Height:        Intake/Output Summary (Last 24 hours) at 09/06/2020 1138 Last data filed at 09/06/2020 1035 Gross per 24 hour  Intake  180 ml  Output 450 ml  Net -270 ml   Filed Weights   08/31/20 1934  Weight: 62.6 kg    Examination:  Constitutional: No distress, in bed Eyes: No scleral icterus ENMT: mmm Neck: normal, supple Respiratory: Lungs are clear bilaterally, diminished at the bases but no wheezing, no crackles Cardiovascular: Regular rate and rhythm, no murmurs, no peripheral edema Abdomen: Soft, NT, ND, bowel sounds positive Musculoskeletal: no clubbing / cyanosis.  Skin: No rashes seen Neurologic: No focal deficits   Data Reviewed: I have  independently reviewed following labs and imaging studies   CBC: Recent Labs  Lab 08/31/20 2010 09/01/20 0610 09/02/20 0359 09/04/20 0636 09/05/20 0355  WBC 6.6 4.8 5.7 7.2 8.5  NEUTROABS 6.0  --  4.4 5.9 6.8  HGB 16.5* 14.4 14.3 12.6 12.6  HCT 51.0* 45.1 45.5 38.6 38.7  MCV 97.3 97.0 98.9 95.3 96.3  PLT 179 141* 141* 213 207   Basic Metabolic Panel: Recent Labs  Lab 08/31/20 2010 09/01/20 0610 09/02/20 0359 09/04/20 0636 09/05/20 0355  NA 139  --  142 144 144  K 4.4  --  3.6 3.9 4.1  CL 103  --  105 109 110  CO2 24  --  22 26 26   GLUCOSE 114*  --  79 155* 131*  BUN 31*  --  10 22 17   CREATININE 1.05* 0.67 0.68 0.54 0.58  CALCIUM 9.0  --  8.0* 8.1* 7.8*   GFR: Estimated Creatinine Clearance: 43.7 mL/min (by C-G formula based on SCr of 0.58 mg/dL). Liver Function Tests: Recent Labs  Lab 08/31/20 2010 09/02/20 0359 09/04/20 0636 09/05/20 0355  AST 51* 53* 27 34  ALT 23 17 16 23   ALKPHOS 66 48 38 38  BILITOT 1.6* 1.2 0.9 0.8  PROT 8.0 6.0* 5.2* 4.9*  ALBUMIN 3.8 2.8* 2.2* 2.1*   No results for input(s): LIPASE, AMYLASE in the last 168 hours. No results for input(s): AMMONIA in the last 168 hours. Coagulation Profile: Recent Labs  Lab 08/31/20 2010  INR 1.0   Cardiac Enzymes: No results for input(s): CKTOTAL, CKMB, CKMBINDEX, TROPONINI in the last 168 hours. BNP (last 3 results) No results for input(s): PROBNP in the last 8760 hours. HbA1C: No results for input(s): HGBA1C in the last 72 hours. CBG: Recent Labs  Lab 09/01/20 1654 09/01/20 2117 09/02/20 1711 09/03/20 0808 09/03/20 1201  GLUCAP 87 81 88 140* 151*   Lipid Profile: No results for input(s): CHOL, HDL, LDLCALC, TRIG, CHOLHDL, LDLDIRECT in the last 72 hours. Thyroid Function Tests: No results for input(s): TSH, T4TOTAL, FREET4, T3FREE, THYROIDAB in the last 72 hours. Anemia Panel: No results for input(s): VITAMINB12, FOLATE, FERRITIN, TIBC, IRON, RETICCTPCT in the last 72  hours. Urine analysis:    Component Value Date/Time   COLORURINE AMBER (A) 08/31/2020 2252   APPEARANCEUR CLEAR 08/31/2020 2252   LABSPEC 1.018 08/31/2020 2252   PHURINE 5.0 08/31/2020 2252   GLUCOSEU NEGATIVE 08/31/2020 2252   HGBUR SMALL (A) 08/31/2020 2252   BILIRUBINUR NEGATIVE 08/31/2020 2252   BILIRUBINUR neg 07/18/2014 1320   KETONESUR NEGATIVE 08/31/2020 2252   PROTEINUR 30 (A) 08/31/2020 2252   UROBILINOGEN 0.2 07/18/2014 1320   UROBILINOGEN 0.2 06/14/2014 1236   NITRITE NEGATIVE 08/31/2020 2252   LEUKOCYTESUR MODERATE (A) 08/31/2020 2252   Sepsis Labs: Invalid input(s): PROCALCITONIN, LACTICIDVEN  Recent Results (from the past 240 hour(s))  Blood Culture (routine x 2)     Status: None   Collection Time: 08/31/20  8:10 PM   Specimen: BLOOD  Result Value Ref Range Status   Specimen Description   Final    BLOOD LEFT ANTECUBITAL Performed at [facility], [address], [place], [place] [address]    Special Requests   Final    BOTTLES DRAWN AEROBIC AND ANAEROBIC Blood Culture adequate volume Performed at [facility], [address], [place], [place] [address]    Culture   Final    NO GROWTH 5 DAYS Performed at [facility], [address], [place], [place] [address]    Report Status 09/05/2020 FINAL  Final  Resp Panel by RT-PCR (Flu A&B, Covid) Nasopharyngeal Swab     Status: Abnormal   Collection Time: 08/31/20  8:11 PM   Specimen: Nasopharyngeal Swab; Nasopharyngeal(NP) swabs in vial transport medium  Result Value Ref Range Status   SARS Coronavirus 2 by RT PCR POSITIVE (A) NEGATIVE Final    Comment: LACIVITA H. 12.12.21 @ 2321 BY MECIAL J. (NOTE) SARS-CoV-2 target nucleic acids are DETECTED.  The SARS-CoV-2 RNA is generally detectable in upper respiratory specimens during the acute phase of infection. Positive results are indicative of the presence of the identified virus, but do not rule out bacterial  infection or co-infection with other pathogens not detected by the test. Clinical correlation with patient history and other diagnostic information is necessary to determine patient infection status. The expected result is Negative.  Fact Sheet for Patients: BloggerCourse.com  Fact Sheet for Healthcare Providers: SeriousBroker.it  This test is not yet approved or cleared by the [place] FDA and  has been authorized for detection and/or diagnosis of SARS-CoV-2 by FDA under an Emergency Use Authorization (EUA).  This EUA will remain in effect (meaning this test can be used) for the duration of  the COVID-19 de claration under Section 564(b)(1) of the Act, 21 U.S.C. section 360bbb-3(b)(1), unless the authorization is terminated or revoked sooner.     Influenza A by PCR NEGATIVE NEGATIVE Final   Influenza B by PCR NEGATIVE NEGATIVE Final    Comment: (NOTE) The Xpert Xpress SARS-CoV-2/FLU/RSV plus assay is intended as an aid in the diagnosis of influenza from Nasopharyngeal swab specimens and should not be used as a sole basis for treatment. Nasal washings and aspirates are unacceptable for Xpert Xpress SARS-CoV-2/FLU/RSV testing.  Fact Sheet for Patients: BloggerCourse.com  Fact Sheet for Healthcare Providers: SeriousBroker.it  This test is not yet approved or cleared by the [place] FDA and has been authorized for detection and/or diagnosis of SARS-CoV-2 by FDA under an Emergency Use Authorization (EUA). This EUA will remain in effect (meaning this test can be used) for the duration of the COVID-19 declaration under Section 564(b)(1) of the Act, 21 U.S.C. section 360bbb-3(b)(1), unless the authorization is terminated or revoked.  Performed at [facility], [address] [place], [place] [address]   Urine culture     Status: None   Collection  Time: 08/31/20 10:52 PM   Specimen: In/Out Cath Urine  Result Value Ref Range Status   Specimen Description   Final    IN/OUT CATH URINE Performed at [facility], [address] [place], [place] [address]    Special Requests   Final    NONE Performed at [facility], [address] [place], [place] [address]    Culture   Final    NO GROWTH Performed at [facility], [address] [place], [place] [address]  Report Status 09/01/2020 FINAL  Final  Blood Culture (routine x 2)     Status: Abnormal   Collection Time: 09/01/20 10:11 AM   Specimen: BLOOD LEFT HAND  Result Value Ref Range Status   Specimen Description   Final    BLOOD LEFT HAND Performed at [facility], [address], [place], [place] [address]    Special Requests   Final    BOTTLES DRAWN AEROBIC ONLY Blood Culture adequate volume Performed at [facility], [address], [place], [place] [address]    Culture  Setup Time   Final    GRAM POSITIVE COCCI IN CLUSTERS AEROBIC BOTTLE ONLY Organism ID to follow CRITICAL RESULT CALLED TO, READ BACK BY AND VERIFIED WITH: D. Wofford PharmD 15:45 09/03/20 ([email or web address])    Culture (A)  Final    STAPHYLOCOCCUS CAPITIS THE SIGNIFICANCE OF ISOLATING THIS ORGANISM FROM A SINGLE SET OF BLOOD CULTURES WHEN MULTIPLE SETS ARE DRAWN IS UNCERTAIN. PLEASE NOTIFY THE MICROBIOLOGY DEPARTMENT WITHIN ONE WEEK IF SPECIATION AND SENSITIVITIES ARE REQUIRED. Performed at [facility], [address] [place], [place] [address]    Report Status 09/04/2020 FINAL  Final  Blood Culture ID Panel (Reflexed)     Status: Abnormal   Collection Time: 09/01/20 10:11 AM  Result Value Ref Range Status   Enterococcus faecalis NOT DETECTED NOT DETECTED Final   Enterococcus Faecium NOT DETECTED NOT DETECTED Final   Listeria monocytogenes NOT DETECTED NOT DETECTED Final   Staphylococcus species DETECTED (A) NOT  DETECTED Final    Comment: CRITICAL RESULT CALLED TO, READ BACK BY AND VERIFIED WITH: D. Wofford PharmD 15:45 09/03/20 ([email or web address])    Staphylococcus aureus (BCID) NOT DETECTED NOT DETECTED Final   Staphylococcus epidermidis NOT DETECTED NOT DETECTED Final   Staphylococcus lugdunensis NOT DETECTED NOT DETECTED Final   Streptococcus species NOT DETECTED NOT DETECTED Final   Streptococcus agalactiae NOT DETECTED NOT DETECTED Final   Streptococcus pneumoniae NOT DETECTED NOT DETECTED Final   Streptococcus pyogenes NOT DETECTED NOT DETECTED Final   A.calcoaceticus-baumannii NOT DETECTED NOT DETECTED Final   Bacteroides fragilis NOT DETECTED NOT DETECTED Final   Enterobacterales NOT DETECTED NOT DETECTED Final   Enterobacter cloacae complex NOT DETECTED NOT DETECTED Final   Escherichia coli NOT DETECTED NOT DETECTED Final   Klebsiella aerogenes NOT DETECTED NOT DETECTED Final   Klebsiella oxytoca NOT DETECTED NOT DETECTED Final   Klebsiella pneumoniae NOT DETECTED NOT DETECTED Final   Proteus species NOT DETECTED NOT DETECTED Final   Salmonella species NOT DETECTED NOT DETECTED Final   Serratia marcescens NOT DETECTED NOT DETECTED Final   Haemophilus influenzae NOT DETECTED NOT DETECTED Final   Neisseria meningitidis NOT DETECTED NOT DETECTED Final   Pseudomonas aeruginosa NOT DETECTED NOT DETECTED Final   Stenotrophomonas maltophilia NOT DETECTED NOT DETECTED Final   Candida albicans NOT DETECTED NOT DETECTED Final   Candida auris NOT DETECTED NOT DETECTED Final   Candida glabrata NOT DETECTED NOT DETECTED Final   Candida krusei NOT DETECTED NOT DETECTED Final   Candida parapsilosis NOT DETECTED NOT DETECTED Final   Candida tropicalis NOT DETECTED NOT DETECTED Final   Cryptococcus neoformans/gattii NOT DETECTED NOT DETECTED Final    Comment: Performed at [facility], [address] [place], [place] [address]      Radiology Studies: DG CHEST PORT 1 VIEW  Result Date:  09/06/2020 CLINICAL DATA:  PICC line placement. EXAM: PORTABLE CHEST 1 VIEW COMPARISON:  Chest radiograph 08/31/2020 FINDINGS: Interval  insertion right upper extremity PICC line. The tip appears malpositioned as it appears to course leftward likely within the central left brachiocephalic vein, recommend retraction and repeat radiograph. Stable enlarged cardiac and mediastinal contours. Aortic atherosclerosis. Low lung volumes. Increased bilateral interstitial pulmonary opacities. Probable small bilateral pleural effusions. Thoracic spine degenerative changes. IMPRESSION: 1. Interval insertion right upper extremity PICC line. The tip appears malpositioned as it appears to course leftward likely within the central left brachiocephalic vein, recommend retraction and repeat radiograph. 2. Increasing bilateral interstitial opacities which may represent edema. 3. These results will be called to the ordering clinician or representative by the Radiologist Assistant, and communication documented in the PACS or [employer]. Electronically Signed   By: Annia Belt M.D.   On: 09/06/2020 09:49    Pamella Pert, MD, PhD [facility]  Between 7 am - 7 pm I am available, please contact me via Amion or Securechat  Between 7 pm - 7 am I am not available, please contact night coverage MD/APP via Amion

## 2020-09-06 NOTE — Progress Notes (Signed)
Pt with right double lumen PICC placed on 12/16 using ECG technology. Site wnl. Red port with sluggish flush and no blood return. Purple port unable to flush and has no blood return. Chest x-ray requested prior to administering alteplase.

## 2020-09-06 NOTE — Plan of Care (Signed)
  Problem: Health Behavior/Discharge Planning: Goal: Ability to manage health-related needs will improve Outcome: Progressing   

## 2020-09-07 DIAGNOSIS — G3184 Mild cognitive impairment, so stated: Secondary | ICD-10-CM

## 2020-09-07 DIAGNOSIS — J9601 Acute respiratory failure with hypoxia: Secondary | ICD-10-CM

## 2020-09-07 DIAGNOSIS — I10 Essential (primary) hypertension: Secondary | ICD-10-CM

## 2020-09-07 DIAGNOSIS — I7101 Dissection of thoracic aorta: Secondary | ICD-10-CM

## 2020-09-07 DIAGNOSIS — E1165 Type 2 diabetes mellitus with hyperglycemia: Secondary | ICD-10-CM

## 2020-09-07 LAB — CBC WITH DIFFERENTIAL/PLATELET
Abs Immature Granulocytes: 0.96 10*3/uL — ABNORMAL HIGH (ref 0.00–0.07)
Basophils Absolute: 0 10*3/uL (ref 0.0–0.1)
Basophils Relative: 0 %
Eosinophils Absolute: 0 10*3/uL (ref 0.0–0.5)
Eosinophils Relative: 0 %
HCT: 47.7 % — ABNORMAL HIGH (ref 36.0–46.0)
Hemoglobin: 15.4 g/dL — ABNORMAL HIGH (ref 12.0–15.0)
Immature Granulocytes: 9 %
Lymphocytes Relative: 9 %
Lymphs Abs: 1 10*3/uL (ref 0.7–4.0)
MCH: 31.1 pg (ref 26.0–34.0)
MCHC: 32.3 g/dL (ref 30.0–36.0)
MCV: 96.4 fL (ref 80.0–100.0)
Monocytes Absolute: 0.7 10*3/uL (ref 0.1–1.0)
Monocytes Relative: 6 %
Neutro Abs: 8.2 10*3/uL — ABNORMAL HIGH (ref 1.7–7.7)
Neutrophils Relative %: 76 %
Platelets: 242 10*3/uL (ref 150–400)
RBC: 4.95 MIL/uL (ref 3.87–5.11)
RDW: 13 % (ref 11.5–15.5)
WBC: 10.8 10*3/uL — ABNORMAL HIGH (ref 4.0–10.5)
nRBC: 0.4 % — ABNORMAL HIGH (ref 0.0–0.2)

## 2020-09-07 LAB — COMPREHENSIVE METABOLIC PANEL
ALT: 37 U/L (ref 0–44)
AST: 30 U/L (ref 15–41)
Albumin: 2.4 g/dL — ABNORMAL LOW (ref 3.5–5.0)
Alkaline Phosphatase: 50 U/L (ref 38–126)
Anion gap: 11 (ref 5–15)
BUN: 9 mg/dL (ref 8–23)
CO2: 24 mmol/L (ref 22–32)
Calcium: 7.9 mg/dL — ABNORMAL LOW (ref 8.9–10.3)
Chloride: 104 mmol/L (ref 98–111)
Creatinine, Ser: 0.54 mg/dL (ref 0.44–1.00)
GFR, Estimated: >60 mL/min (ref >60)
Glucose, Bld: 151 mg/dL — ABNORMAL HIGH (ref 70–99)
Potassium: 4 mmol/L (ref 3.5–5.1)
Sodium: 139 mmol/L (ref 135–145)
Total Bilirubin: 1 mg/dL (ref 0.3–1.2)
Total Protein: 5.3 g/dL — ABNORMAL LOW (ref 6.5–8.1)

## 2020-09-07 LAB — C-REACTIVE PROTEIN: CRP: 4.4 mg/dL — ABNORMAL HIGH (ref <1.0)

## 2020-09-07 LAB — D-DIMER, QUANTITATIVE: D-Dimer, Quant: 1.02 ug/mL-FEU — ABNORMAL HIGH (ref 0.00–0.50)

## 2020-09-07 NOTE — Progress Notes (Addendum)
PROGRESS NOTE  Kaitlin Smith  XTG:[id number] DOB: 11-23-1929 DOA: 08/31/2020 PCP: Alysia Penna, MD   Brief Narrative: Kaitlin Smith is an 86 y.o. female with a history of dementia, T2DM, HTN, CVA, PVD, AAA who presented to the ED 12/13 with generalized weakness. In the ED she was febrile with elevated lactic acid improved with fluids. CT head nonacute. Urinalysis suggested UTI for which ceftriaxone was given. SARS-CoV-2 PCR was positive, though CXR was without infiltrates and patient without respiratory symptoms/signs or hypoxia. She was admitted, found to have acute DVT of the right popliteal vein for which anticoagulation was started.  Assessment & Plan: Principal Problem:   COVID-19 virus infection Active Problems:   Essential hypertension   MCI (mild cognitive impairment)   Aortic dissection (HCC)   Controlled type 2 diabetes mellitus with hyperglycemia (HCC)   COVID   Acute lower UTI   Acute hypoxemic respiratory failure due to COVID-19 [facility])  Acute hypoxemic respiratory failure due to covid-19 pneumonia: SARS-CoV-2 PCR positive on 12/13.  - Completed remdesivir (12/15 - 12/19) - Continue steroids (started 12/15) - No indication for baricitinib - Encourage OOB, IS, FV, and awake proning if able - Continue airborne, contact precautions for 21 days from positive testing. - Monitor CMP and inflammatory markers - Enoxaparin prophylactic dose.   Acute right popliteal vein DVT:  - Continue anticoagulation with lovenox  UTI: Sepsis has been ruled out. - s/p ceftriaxone, cultures without growth  1 of 4 blood cultures grew S. capitis, consistent with contaminant.   Lymphopenia, thrombocytopenia:  - Due to covid, resolved.   AKI: Resolved.  - Continue monitoring, restarting IV fluids.   Generalized weakness, poor per oral intake: Suspected sequelae of covid infection.  - PT recommends SNF for rehabilitation, though the patient and family decline this and wish to take the  patient home. She is still not eating enough to sustain life, so will continue IVF.  Malpositioned PICC line: Based on XR 12/19. Unclear if this was addressed yesterday.  - RN to contact PICC team.  Dementia: Stable.   Spurious d-dimer: Noted 12/19.   DVT prophylaxis: Lovenox Code Status: DNR Family Communication: None at bedside Disposition Plan:  Status is: Inpatient  Remains inpatient appropriate because:Unsafe d/c plan and Inpatient level of care appropriate due to severity of illness  Dispo: The patient is from: Home              Anticipated d/c is to: Home              Anticipated d/c date is: 2 days              Patient currently is not medically stable to d/c.  Consultants:   None  Procedures:   None  Antimicrobials:  Ceftriaxone, remdesivir   Subjective: Pt without complaint this morning. No dyspnea, but saturations went to 82% when O2 weaned per RN. No chest pain or bleeding.  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 Clear to auscultation bilaterally.  CV: Regular rate and rhythm. No murmur, rub, or gallop. No JVD, no pedal edema. GI: Abdomen soft, non-tender, non-distended, with normoactive bowel sounds. No organomegaly or masses felt. Ext: Warm, no deformities Skin: No rashes, lesions or ulcers on visualized skin. Neuro: Alert and oriented. No acute focal neurological deficits. Psych:  Judgement and insight appear impaired. Mood & affect appropriate.   Data Reviewed: I have personally reviewed following labs and imaging studies  CBC: Recent Labs  Lab 08/31/20 2010 09/01/20 0610 09/02/20 0359 09/04/20 0636 09/05/20 0355 09/07/20 0310  WBC 6.6 4.8 5.7 7.2 8.5 10.8*  NEUTROABS 6.0  --  4.4 5.9 6.8 8.2*  HGB 16.5* 14.4 14.3 12.6 12.6 15.4*  HCT 51.0* 45.1 45.5 38.6 38.7 47.7*  MCV 97.3 97.0 98.9 95.3 96.3 96.4  PLT 179 141* 141* 213 207 242   Basic Metabolic Panel: Recent Labs  Lab 08/31/20 2010 09/01/20 0610 09/02/20 0359 09/04/20 0636 09/05/20 0355 09/07/20 0310  NA 139  --  142 144 144 139  K 4.4  --  3.6 3.9 4.1 4.0  CL 103  --  105 109 110 104  CO2 24  --  22 26 26 24   GLUCOSE 114*  --  79 155* 131* 151*  BUN 31*  --  10 22 17 9   CREATININE 1.05* 0.67 0.68 0.54 0.58 0.54  CALCIUM 9.0  --  8.0* 8.1* 7.8* 7.9*   GFR: Estimated Creatinine Clearance: 43.7 mL/min (by C-G formula based on SCr of 0.54 mg/dL). Liver Function Tests: Recent Labs  Lab 08/31/20 2010 09/02/20 0359 09/04/20 0636 09/05/20 0355 09/07/20 0310  AST 51* 53* 27 34 30  ALT 23 17 16 23  37  ALKPHOS 66 48 38 38 50  BILITOT 1.6* 1.2 0.9 0.8 1.0  PROT 8.0 6.0* 5.2* 4.9* 5.3*  ALBUMIN 3.8 2.8* 2.2* 2.1* 2.4*   No results for input(s): LIPASE, AMYLASE in the last 168 hours. No results for input(s): AMMONIA in the last 168 hours. Coagulation Profile: Recent Labs  Lab 08/31/20 2010  INR 1.0   Cardiac Enzymes: No results for input(s): CKTOTAL, CKMB, CKMBINDEX, TROPONINI in the last 168 hours. BNP (last 3 results) No results for input(s): PROBNP in the last 8760 hours. HbA1C: No results for input(s): HGBA1C in the last 72 hours. CBG: Recent Labs  Lab 09/01/20 1654 09/01/20 2117 09/02/20 1711 09/03/20 0808 09/03/20 1201  GLUCAP 87 81 88 140* 151*   Lipid Profile: No results for input(s): CHOL, HDL, LDLCALC, TRIG, CHOLHDL, LDLDIRECT in the last 72 hours. Thyroid  Function Tests: No results for input(s): TSH, T4TOTAL, FREET4, T3FREE, THYROIDAB in the last 72 hours. Anemia Panel: No results for input(s): VITAMINB12, FOLATE, FERRITIN, TIBC, IRON, RETICCTPCT in the last 72 hours. Urine analysis:    Component Value Date/Time   COLORURINE AMBER (A) 08/31/2020 2252   APPEARANCEUR CLEAR 08/31/2020 2252   LABSPEC 1.018 08/31/2020 2252   PHURINE 5.0 08/31/2020 2252   GLUCOSEU NEGATIVE 08/31/2020 2252   HGBUR SMALL (A) 08/31/2020 2252   BILIRUBINUR NEGATIVE 08/31/2020 2252   BILIRUBINUR neg 07/18/2014 1320   KETONESUR NEGATIVE 08/31/2020 2252   PROTEINUR 30 (A) 08/31/2020 2252   UROBILINOGEN 0.2 07/18/2014 1320   UROBILINOGEN 0.2 06/14/2014 1236   NITRITE NEGATIVE  08/31/2020 2252   LEUKOCYTESUR MODERATE (A) 08/31/2020 2252   Recent Results (from the past 240 hour(s))  Blood Culture (routine x 2)     Status: None   Collection Time: 08/31/20  8:10 PM   Specimen: BLOOD  Result Value Ref Range Status   Specimen Description   Final    BLOOD LEFT ANTECUBITAL Performed at [facility], [address], [place], [place] [address]    Special Requests   Final    BOTTLES DRAWN AEROBIC AND ANAEROBIC Blood Culture adequate volume Performed at [facility], [address] [place], [place] [address]    Culture   Final    NO GROWTH 5 DAYS Performed at [facility], [address], [place], [place] [address]    Report Status 09/05/2020 FINAL  Final  Resp Panel by RT-PCR (Flu A&B, Covid) Nasopharyngeal Swab     Status: Abnormal   Collection Time: 08/31/20  8:11 PM   Specimen: Nasopharyngeal Swab; Nasopharyngeal(NP) swabs in vial transport medium  Result Value Ref Range Status   SARS Coronavirus 2 by RT PCR POSITIVE (A) NEGATIVE Final    Comment: LACIVITA H. 12.12.21 @ 2321 BY MECIAL J. (NOTE) SARS-CoV-2 target nucleic acids are DETECTED.  The SARS-CoV-2 RNA is generally detectable in upper respiratory specimens  during the acute phase of infection. Positive results are indicative of the presence of the identified virus, but do not rule out bacterial infection or co-infection with other pathogens not detected by the test. Clinical correlation with patient history and other diagnostic information is necessary to determine patient infection status. The expected result is Negative.  Fact Sheet for Patients: BloggerCourse.com  Fact Sheet for Healthcare Providers: SeriousBroker.it  This test is not yet approved or cleared by the [place] FDA and  has been authorized for detection and/or diagnosis of SARS-CoV-2 by FDA under an Emergency Use Authorization (EUA).  This EUA will remain in effect (meaning this test can be used) for the duration of  the COVID-19 de claration under Section 564(b)(1) of the Act, 21 U.S.C. section 360bbb-3(b)(1), unless the authorization is terminated or revoked sooner.     Influenza A by PCR NEGATIVE NEGATIVE Final   Influenza B by PCR NEGATIVE NEGATIVE Final    Comment: (NOTE) The Xpert Xpress SARS-CoV-2/FLU/RSV plus assay is intended as an aid in the diagnosis of influenza from Nasopharyngeal swab specimens and should not be used as a sole basis for treatment. Nasal washings and aspirates are unacceptable for Xpert Xpress SARS-CoV-2/FLU/RSV testing.  Fact Sheet for Patients: BloggerCourse.com  Fact Sheet for Healthcare Providers: SeriousBroker.it  This test is not yet approved or cleared by the [place] FDA and has been authorized for detection and/or diagnosis of SARS-CoV-2 by FDA under an Emergency Use Authorization (EUA). This EUA will remain in effect (meaning this test can be used) for the duration of the COVID-19 declaration under Section 564(b)(1) of the Act, 21 U.S.C. section 360bbb-3(b)(1), unless the authorization is terminated  or revoked.  Performed at [facility], [address] [place], [place] [address]   Urine culture     Status: None   Collection Time: 08/31/20 10:52 PM   Specimen: In/Out Cath Urine  Result Value Ref Range Status   Specimen Description   Final    IN/OUT CATH URINE Performed at [facility], [address], [place], [place] [address]    Special Requests   Final    NONE Performed at  [facility], [address], [place], [place] [address]    Culture   Final    NO GROWTH Performed at [facility], [address] [place], [place] [address]    Report Status 09/01/2020 FINAL  Final  Blood Culture (routine x 2)     Status: Abnormal   Collection Time: 09/01/20 10:11 AM   Specimen: BLOOD LEFT HAND  Result Value Ref Range Status   Specimen Description   Final    BLOOD LEFT HAND Performed at [facility], [address], [place], [place] [address]    Special Requests   Final    BOTTLES DRAWN AEROBIC ONLY Blood Culture adequate volume Performed at [facility], [address], [place], [place] [address]    Culture  Setup Time   Final    GRAM POSITIVE COCCI IN CLUSTERS AEROBIC BOTTLE ONLY Organism ID to follow CRITICAL RESULT CALLED TO, READ BACK BY AND VERIFIED WITH: D. Wofford PharmD 15:45 09/03/20 ([email or web address])    Culture (A)  Final    STAPHYLOCOCCUS CAPITIS THE SIGNIFICANCE OF ISOLATING THIS ORGANISM FROM A SINGLE SET OF BLOOD CULTURES WHEN MULTIPLE SETS ARE DRAWN IS UNCERTAIN. PLEASE NOTIFY THE MICROBIOLOGY DEPARTMENT WITHIN ONE WEEK IF SPECIATION AND SENSITIVITIES ARE REQUIRED. Performed at [facility], [address] [place], [place] [address]    Report Status 09/04/2020 FINAL  Final  Blood Culture ID Panel (Reflexed)     Status: Abnormal   Collection Time: 09/01/20 10:11 AM  Result Value Ref Range Status   Enterococcus faecalis NOT DETECTED NOT DETECTED Final    Enterococcus Faecium NOT DETECTED NOT DETECTED Final   Listeria monocytogenes NOT DETECTED NOT DETECTED Final   Staphylococcus species DETECTED (A) NOT DETECTED Final    Comment: CRITICAL RESULT CALLED TO, READ BACK BY AND VERIFIED WITH: D. Wofford PharmD 15:45 09/03/20 ([email or web address])    Staphylococcus aureus (BCID) NOT DETECTED NOT DETECTED Final   Staphylococcus epidermidis NOT DETECTED NOT DETECTED Final   Staphylococcus lugdunensis NOT DETECTED NOT DETECTED Final   Streptococcus species NOT DETECTED NOT DETECTED Final   Streptococcus agalactiae NOT DETECTED NOT DETECTED Final   Streptococcus pneumoniae NOT DETECTED NOT DETECTED Final   Streptococcus pyogenes NOT DETECTED NOT DETECTED Final   A.calcoaceticus-baumannii NOT DETECTED NOT DETECTED Final   Bacteroides fragilis NOT DETECTED NOT DETECTED Final   Enterobacterales NOT DETECTED NOT DETECTED Final   Enterobacter cloacae complex NOT DETECTED NOT DETECTED Final   Escherichia coli NOT DETECTED NOT DETECTED Final   Klebsiella aerogenes NOT DETECTED NOT DETECTED Final   Klebsiella oxytoca NOT DETECTED NOT DETECTED Final   Klebsiella pneumoniae NOT DETECTED NOT DETECTED Final   Proteus species NOT DETECTED NOT DETECTED Final   Salmonella species NOT DETECTED NOT DETECTED Final   Serratia marcescens NOT DETECTED NOT DETECTED Final   Haemophilus influenzae NOT DETECTED NOT DETECTED Final   Neisseria meningitidis NOT DETECTED NOT DETECTED Final   Pseudomonas aeruginosa NOT DETECTED NOT DETECTED Final   Stenotrophomonas maltophilia NOT DETECTED NOT DETECTED Final   Candida albicans NOT DETECTED NOT DETECTED Final   Candida auris NOT DETECTED NOT DETECTED Final   Candida glabrata NOT DETECTED NOT DETECTED Final   Candida krusei NOT DETECTED NOT DETECTED Final   Candida parapsilosis NOT DETECTED NOT DETECTED Final   Candida tropicalis NOT DETECTED NOT DETECTED Final   Cryptococcus neoformans/gattii NOT DETECTED NOT DETECTED Final     Comment: Performed at [facility], [address] [place], [place] [address]      Radiology Studies: DG CHEST PORT 1 VIEW  Result Date: 09/06/2020 CLINICAL DATA:  PICC line placement. EXAM: PORTABLE CHEST 1 VIEW COMPARISON:  Chest radiograph 08/31/2020 FINDINGS: Interval insertion right upper extremity PICC line. The tip appears malpositioned as it appears to course leftward likely within the central left brachiocephalic vein, recommend retraction and repeat radiograph. Stable enlarged cardiac and mediastinal contours. Aortic atherosclerosis. Low lung volumes. Increased bilateral interstitial pulmonary opacities. Probable small bilateral pleural effusions. Thoracic spine degenerative changes. IMPRESSION: 1. Interval insertion right upper extremity PICC line. The tip appears malpositioned as it appears to course leftward likely within the central left brachiocephalic vein, recommend retraction and repeat radiograph. 2. Increasing bilateral interstitial opacities which may represent edema. 3. These results will be called to the ordering clinician or representative by the Radiologist Assistant, and communication documented in the PACS or [employer]. Electronically Signed   By: Annia Belt M.D.   On: 09/06/2020 09:49    Scheduled Meds: . Chlorhexidine Gluconate Cloth  6 each Topical Daily  . enoxaparin (LOVENOX) injection  60 mg Subcutaneous Q12H  . feeding supplement  237 mL Oral BID BM  . methylPREDNISolone (SOLU-MEDROL) injection  40 mg Intravenous Q12H  . pantoprazole  40 mg Oral Daily   Continuous Infusions: . sodium chloride 50 mL/hr at 09/07/20 0440     LOS: 6 days   Time spent: 25 minutes.  Tyrone Nine, MD [facility] [email or web address] 09/07/2020, 3:03 PM

## 2020-09-07 NOTE — Progress Notes (Signed)
Physical Therapy Treatment Patient Details Name: Kaitlin Smith MRN: [id number] DOB: 11/07/1929 Today's Date: 09/07/2020    History of Present Illness Patient is an 86 year old female hx of dementia, T2DM, HTN, CVA, PVD, AAA who presented to the ED 12/13 with generalized weakness. Patient positive for COVID    PT Comments    Pt cooperates with much encouragement. Requires multi-modal cues for participation and due to low vision. Continue recommend SNF. See belwo for details.   Follow Up Recommendations  SNF     Equipment Recommendations  None recommended by PT    Recommendations for Other Services       Precautions / Restrictions Precautions Precautions: Fall Precaution Comments: currently on 4L Restrictions Weight Bearing Restrictions: No    Mobility  Bed Mobility Overal bed mobility: Needs Assistance Bed Mobility: Supine to Sit;Sit to Supine     Supine to sit: Mod assist;Max assist;+2 for safety/equipment;+2 for physical assistance Sit to supine: Mod assist;+2 for physical assistance;+2 for safety/equipment   General bed mobility comments: multi-modal cues for initiation of task, assist to bring LEs off bed and elevate trunk  Transfers Overall transfer level: Needs assistance Equipment used: 2 person hand held assist Transfers: Sit to/from Stand Sit to Stand: Mod assist;Min assist;+2 physical assistance;+2 safety/equipment         General transfer comment: mod A x2 to power up to standing, patient with posterior lean and decreased activity tolerance wanting to quickly sit back onto EOB  Ambulation/Gait                 Stairs             Wheelchair Mobility    Modified Rankin (Stroke Patients Only)       Balance   Sitting-balance support: Feet supported;Bilateral upper extremity supported Sitting balance-Leahy Scale: Fair Sitting balance - Comments: intermittently pt leaning back or trying to lie down     Standing balance-Leahy Scale:  Zero                              Cognition Arousal/Alertness: Awake/alert Behavior During Therapy: Flat affect Overall Cognitive Status: No family/caregiver present to determine baseline cognitive functioning Area of Impairment: Orientation;Following commands;Problem solving                 Orientation Level: Disoriented to;Place;Time;Situation     Following Commands: Follows one step commands inconsistently;Follows multi-step commands inconsistently     Problem Solving: Decreased initiation;Difficulty sequencing;Requires verbal cues;Requires tactile cues General Comments: patient  taking off O2 even with mittens on (had removed one mitten on PT arrival)      Exercises      General Comments        Pertinent Vitals/Pain Pain Assessment: No/denies pain Faces Pain Scale:  (c/o nausea)    Home Living                      Prior Function            PT Goals (current goals can now be found in the care plan section) Acute Rehab PT Goals Patient Stated Goal: I am sick PT Goal Formulation: Patient unable to participate in goal setting Time For Goal Achievement: 09/16/20 Potential to Achieve Goals: Fair Progress towards PT goals: Progressing toward goals    Frequency    Min 2X/week      PT Plan Current plan remains appropriate    Co-evaluation  AM-PAC PT "6 Clicks" Mobility   Outcome Measure  Help needed turning from your back to your side while in a flat bed without using bedrails?: A Lot Help needed moving from lying on your back to sitting on the side of a flat bed without using bedrails?: A Lot Help needed moving to and from a bed to a chair (including a wheelchair)?: A Lot Help needed standing up from a chair using your arms (e.g., wheelchair or bedside chair)?: A Lot Help needed to walk in hospital room?: Total Help needed climbing 3-5 steps with a railing? : Total 6 Click Score: 10    End of Session    Activity Tolerance: Patient tolerated treatment well Patient left: in bed;with call bell/phone within reach;with bed alarm set Nurse Communication: Mobility status PT Visit Diagnosis: Unsteadiness on feet (R26.81);Difficulty in walking, not elsewhere classified (R26.2)     Time: 1610-9604 PT Time Calculation (min) (ACUTE ONLY): 18 min  Charges:  $Therapeutic Activity: 8-22 mins                     Delice Bison, PT  Acute Rehab Dept ([facility]) [phone] Pager [phone]  09/07/2020    [facility] 09/07/2020, 12:09 PM

## 2020-09-07 NOTE — Care Management Important Message (Signed)
Important Message  Patient Details IM Letter given to the Patient. Name: Latrell Reitan MRN: [id number] Date of Birth: 06-26-1930   Medicare Important Message Given:  Yes     Caren Macadam 09/07/2020, 12:17 PM

## 2020-09-07 NOTE — TOC Progression Note (Signed)
Transition of Care [facility]) - Progression Note    Patient Details  Name: Kaitlin Smith MRN: [id number] Date of Birth: 18-Aug-1930  Transition of Care [facility]) CM/SW Contact  Ida Rogue, [place] Phone Number: 09/07/2020, 11:25 AM  Clinical Narrative:   Spoke with care giver Ms Tiburcio Pea who is now on same floor as patient.  Ms Tiburcio Pea is hoping to d/c tomorrow, states she has changed her mind and no longer wished to have patient go to SNF, but rather return home.  Ms Tiburcio Pea states she feels confident that with family help, Ms Peeters will be fine returning home.  Will plan on setting her up for [facility] services.  Ms Tiburcio Pea wondered about getting an aide in the home.  I explained she will need to reach out to DSS to find out about applying for MCD.  ONce that is in place, she can go through PCP to get aide services in the home.  She voiced understanding. TOC will continue to follow during the course of hospitalization.     Expected Discharge Plan: Home w Home Health Services Barriers to Discharge: Barriers Resolved  Expected Discharge Plan and Services Expected Discharge Plan: Home w Home Health Services                                               Social Determinants of Health (SDOH) Interventions    Readmission Risk Interventions No flowsheet data found.

## 2020-09-07 NOTE — Plan of Care (Signed)
  Problem: Safety: Goal: Ability to remain free from injury will improve Outcome: Progressing   Problem: Skin Integrity: Goal: Risk for impaired skin integrity will decrease Outcome: Progressing   

## 2020-09-08 ENCOUNTER — Inpatient Hospital Stay (HOSPITAL_COMMUNITY): Payer: Medicare Other

## 2020-09-08 ENCOUNTER — Inpatient Hospital Stay: Payer: Self-pay

## 2020-09-08 LAB — COMPREHENSIVE METABOLIC PANEL
ALT: 28 U/L (ref 0–44)
AST: 26 U/L (ref 15–41)
Albumin: 2.1 g/dL — ABNORMAL LOW (ref 3.5–5.0)
Alkaline Phosphatase: 44 U/L (ref 38–126)
Anion gap: 12 (ref 5–15)
BUN: 8 mg/dL (ref 8–23)
CO2: 21 mmol/L — ABNORMAL LOW (ref 22–32)
Calcium: 7.5 mg/dL — ABNORMAL LOW (ref 8.9–10.3)
Chloride: 104 mmol/L (ref 98–111)
Creatinine, Ser: 0.39 mg/dL — ABNORMAL LOW (ref 0.44–1.00)
GFR, Estimated: >60 mL/min (ref >60)
Glucose, Bld: 175 mg/dL — ABNORMAL HIGH (ref 70–99)
Potassium: 4 mmol/L (ref 3.5–5.1)
Sodium: 137 mmol/L (ref 135–145)
Total Bilirubin: 1.3 mg/dL — ABNORMAL HIGH (ref 0.3–1.2)
Total Protein: 4.8 g/dL — ABNORMAL LOW (ref 6.5–8.1)

## 2020-09-08 LAB — D-DIMER, QUANTITATIVE: D-Dimer, Quant: 1.04 ug/mL-FEU — ABNORMAL HIGH (ref 0.00–0.50)

## 2020-09-08 LAB — TROPONIN I (HIGH SENSITIVITY)
Troponin I (High Sensitivity): 96 ng/L — ABNORMAL HIGH (ref <18)
Troponin I (High Sensitivity): 1188 ng/L (ref <18)

## 2020-09-08 LAB — C-REACTIVE PROTEIN: CRP: 7.1 mg/dL — ABNORMAL HIGH (ref <1.0)

## 2020-09-08 LAB — BRAIN NATRIURETIC PEPTIDE: B Natriuretic Peptide: 1007.4 pg/mL — ABNORMAL HIGH (ref 0.0–100.0)

## 2020-09-08 MED ORDER — FUROSEMIDE 10 MG/ML IJ SOLN
80.0000 mg | Freq: Once | INTRAMUSCULAR | Status: AC
  Administered 2020-09-08: 80 mg via INTRAVENOUS
  Filled 2020-09-08: qty 8

## 2020-09-08 MED ORDER — FUROSEMIDE 10 MG/ML IJ SOLN
INTRAMUSCULAR | Status: AC
  Filled 2020-09-08: qty 4

## 2020-09-08 MED ORDER — METOPROLOL TARTRATE 25 MG PO TABS
12.5000 mg | ORAL_TABLET | Freq: Two times a day (BID) | ORAL | Status: DC
  Administered 2020-09-08 – 2020-09-09 (×3): 12.5 mg via ORAL
  Filled 2020-09-08 (×3): qty 1

## 2020-09-08 MED ORDER — NITROGLYCERIN 0.4 MG SL SUBL
0.4000 mg | SUBLINGUAL_TABLET | SUBLINGUAL | Status: DC | PRN
  Administered 2020-09-08: 0.4 mg via SUBLINGUAL
  Filled 2020-09-08: qty 1

## 2020-09-08 MED ORDER — ASPIRIN EC 81 MG PO TBEC
81.0000 mg | DELAYED_RELEASE_TABLET | Freq: Every day | ORAL | Status: DC
  Administered 2020-09-08 – 2020-09-13 (×6): 81 mg via ORAL
  Filled 2020-09-08 (×6): qty 1

## 2020-09-08 NOTE — Progress Notes (Signed)
°   09/08/20 1415  Vitals  Temp (!) 97.5 F (36.4 C)  Temp Source Oral  BP 130/82  BP Location Left Arm  BP Method Manual  Patient Position (if appropriate) Lying  Pulse Rate (!) 125  Pulse Rate Source Monitor  Resp 20  Level of Consciousness  Level of Consciousness Alert  MEWS COLOR  MEWS Score Color Yellow  Oxygen Therapy  SpO2 (!) 82 %  O2 Device HFNC  O2 Flow Rate (L/min) 8 L/min  Pain Assessment  Pain Scale 0-10  Pain Score 10  Pain Type Acute pain  Pain Location Chest  Pain Onset Sudden  Patients Stated Pain Goal 0  Pain Intervention(s) MD notified (Comment) (Dr. Jarvis Newcomer)  Multiple Pain Sites No  PCA/Epidural/Spinal Assessment  Respiratory Pattern Regular;Labored;Dyspnea at rest;Dyspnea with exertion  MEWS Score  MEWS Temp 0  MEWS Systolic 0  MEWS Pulse 2  MEWS RR 0  MEWS LOC 0  MEWS Score 2  Provider Notification  Provider Name/Title dr. Jarvis Newcomer  Date Provider Notified 09/08/20  Time Provider Notified 1415  Notification Type Page  Notification Reason Change in status  Response See new orders  Date of Provider Response 09/08/20  Time of Provider Response 1425  Patient in yellow mews. Dr. Jarvis Newcomer aware and at bedside. EKG, labs, cxr ordered. 15L HFNC and 15L NRB.

## 2020-09-08 NOTE — Progress Notes (Signed)
Physical Therapy Treatment Patient Details Name: Kaitlin Smith MRN: [id number] DOB: 10-Jan-1930 Today's Date: 09/08/2020    History of Present Illness Patient is an 86 year old female hx of dementia, T2DM, HTN, CVA, PVD, AAA who presented to the ED 12/13 with generalized weakness. Patient positive for COVID    PT Comments     The patient participated in bed mobility with multimodal cues. Patient required max/total asistnace to squat/stand pivot to recliner/. Patient  Noted to be sliding  In recliner, restless so assisted back to bed with 2 max squat/stand  pivot. Patient frequently stating" I am going to throw up", then coughs deeply.   Follow Up Recommendations  SNF     Equipment Recommendations  None recommended by PT    Recommendations for Other Services       Precautions / Restrictions Precautions Precaution Comments: currently on 4L, patient is Blind    Mobility  Bed Mobility Overal bed mobility: Needs Assistance Bed Mobility: Supine to Sit;Sit to Supine     Supine to sit: Mod assist;Max assist;+2 for safety/equipment;+2 for physical assistance Sit to supine: Mod assist;+2 for physical assistance;+2 for safety/equipment   General bed mobility comments: multi-modal cues for initiation of task, assist to bring LEs off bed and elevate trunk  Transfers Overall transfer level: Needs assistance Equipment used: 2 person hand held assist Transfers: Sit to/from Stand;Stand Pivot Transfers Sit to Stand: Max assist;+2 physical assistance;+2 safety/equipment Stand pivot transfers: Max assist;+2 physical assistance;+2 safety/equipment       General transfer comment: requires feet blocked to prevern slidiong. Max assist to stan and pivot to recliner. Had to assist back to bed with 2 max assistance due to patiernt starting to slide from recliner.  Ambulation/Gait                 Stairs             Wheelchair Mobility    Modified Rankin (Stroke Patients  Only)       Balance Overall balance assessment: Needs assistance Sitting-balance support: Feet supported;Bilateral upper extremity supported Sitting balance-Leahy Scale: Poor Sitting balance - Comments: leans back, with cues will sit more upright.   Standing balance support: Bilateral upper extremity supported Standing balance-Leahy Scale: Zero                              Cognition Arousal/Alertness: Awake/alert Behavior During Therapy: Flat affect Overall Cognitive Status: No family/caregiver present to determine baseline cognitive functioning Area of Impairment: Orientation;Following commands;Problem solving                 Orientation Level: Time;Situation     Following Commands: Follows one step commands inconsistently;Follows multi-step commands inconsistently       General Comments: oriented to WL. Intermittently states" I want to go home"      Exercises      General Comments        Pertinent Vitals/Pain Faces Pain Scale: No hurt    Home Living                      Prior Function            PT Goals (current goals can now be found in the care plan section) Progress towards PT goals: Progressing toward goals    Frequency    Min 2X/week      PT Plan Current plan remains appropriate    Co-evaluation  PT/OT/SLP Co-Evaluation/Treatment: Yes   PT goals addressed during session: Mobility/safety with mobility OT goals addressed during session: ADL's and self-care      AM-PAC PT "6 Clicks" Mobility   Outcome Measure  Help needed turning from your back to your side while in a flat bed without using bedrails?: A Lot Help needed moving from lying on your back to sitting on the side of a flat bed without using bedrails?: A Lot Help needed moving to and from a bed to a chair (including a wheelchair)?: Total Help needed standing up from a chair using your arms (e.g., wheelchair or bedside chair)?: Total Help needed to walk  in hospital room?: Total Help needed climbing 3-5 steps with a railing? : Total 6 Click Score: 8    End of Session Equipment Utilized During Treatment: Gait belt Activity Tolerance: Patient tolerated treatment well Patient left: in bed;with call bell/phone within reach;with bed alarm set Nurse Communication: Mobility status;Need for lift equipment PT Visit Diagnosis: Unsteadiness on feet (R26.81);Difficulty in walking, not elsewhere classified (R26.2)     Time: 9163-8466 PT Time Calculation (min) (ACUTE ONLY): 42 min  Charges:  $Therapeutic Activity: 23-37 mins                     Blanchard Kelch PT Acute Rehabilitation Services Pager [phone] Office [phone]    Rada Hay 09/08/2020, 3:18 PM

## 2020-09-08 NOTE — Progress Notes (Signed)
CRITICAL VALUE ALERT  Critical Value:  Troponin 1188  Date & Time Notied:  09/08/20 & 2230  Provider Notified: Yes  Orders Received/Actions taken: No new orders received

## 2020-09-08 NOTE — Progress Notes (Signed)
PROGRESS NOTE  Kaitlin Smith  ZOX:[id number] DOB: 02/12/1930 DOA: 08/31/2020 PCP: Alysia Penna, MD   Brief Narrative: Kaitlin Smith is an 86 y.o. female with a history of dementia, T2DM, HTN, CVA, PVD, AAA who presented to the ED 12/13 with generalized weakness. In the ED she was febrile with elevated lactic acid improved with fluids. CT head nonacute. Urinalysis suggested UTI for which ceftriaxone was given. SARS-CoV-2 PCR was positive, though CXR was without infiltrates and patient without respiratory symptoms/signs or hypoxia. She was admitted, found to have acute DVT of the right popliteal vein for which anticoagulation was started.  Assessment & Plan: Principal Problem:   COVID-19 virus infection Active Problems:   Essential hypertension   MCI (mild cognitive impairment)   Aortic dissection (HCC)   Controlled type 2 diabetes mellitus with hyperglycemia (HCC)   COVID   Acute lower UTI   Acute hypoxemic respiratory failure due to COVID-19 [facility])  Acute hypoxemic respiratory failure due to covid-19 pneumonia: SARS-CoV-2 PCR positive on 12/13.  - Completed remdesivir (12/15 - 12/19) - Continue steroids (started 12/15) - No indication for baricitinib - Encourage OOB, IS, FV, and awake proning if able - Continue airborne, contact precautions for 21 days from positive testing. - Monitor CMP and inflammatory markers - Acute worsening on 09/08/2020: Inflammatory markers have risen, though pt remains afebrile without evolving lobar consolidation to suggest bacterial superinfection. WBC 10.8k in setting of several days of steroids. Chest pain suggestive of cardiac/ACS etiology vs. PE from known DVT. CXR STAT personally reviewed: no PTX, revealing bilateral opacities that are stable. In setting of IV fluids (albeit at 50cc/hr with very little po intake) this could have an element of pulmonary edema. Will give IV lasix now. - Check STAT troponin, d-dimer (would be helpful if >20 despite being on  therapeutic dose lovenox [hasn't missed doses]). - ECG very difficult to interpret, though does not have gross ST elevations. There are nonspecific T wave changes, some of which were present on prior ECGs. Will give aspirin, prn NTG, continue therapeutic dose lovenox (reweigh patient to update dosing). Poor IV access would limit any effort at using heparin.  - STAT PICC line requested for stable IV access and lab draws (multiple unsuccessful attempts this afternoon).  - I have confirmed with patient and family that their goals are to get through this acute illness. The patient does not wish to be comfort measures at this time. We did discuss the futility in ICU level of care including mechanical ventilation. They agree that if work of breathing worsened and/or hypoxia could not be corrected with nasal cannula oxygen + NRB, we would start morphine and institute comfort measures. We will transfer to progressive unit based on their desire to trial heated high flow if hypoxia worsens, though this is primarily limited by patient's inability to keep oxygen on/keeps taking it off.   Acute right popliteal vein DVT:  - Continue anticoagulation with lovenox  UTI: Sepsis has been ruled out. - s/p ceftriaxone, cultures without growth  1 of 4 blood cultures grew S. capitis, consistent with contaminant.   Lymphopenia, thrombocytopenia:  - Due to covid, resolved.   AKI: Resolved.  - Continue monitoring.   Generalized weakness, poor per oral intake: Suspected sequelae of covid infection.  - PT recommends SNF for rehabilitation, though the patient and family decline this and wish to take the patient home. She is still not eating enough to sustain life, so will continue IVF.  Malpositioned PICC line: Based on XR  12/19. This was pulled. Absent on follow up XR.  Dementia: Stable.   Spurious d-dimer: Noted 12/19 with repeat coming down to    DVT prophylaxis: Lovenox Code Status: DNR Family Communication:  Niece who is caretaker updated over phone x2. Disposition Plan:  Status is: Inpatient  Remains inpatient appropriate because:Unsafe d/c plan and Inpatient level of care appropriate due to severity of illness  Dispo: The patient is from: Home              Anticipated d/c is to: Home              Anticipated d/c date is: > 3 days              Patient currently is not medically stable to d/c.  Consultants:   Palliative care  Procedures:   None  Antimicrobials:  Ceftriaxone, remdesivir   Subjective: No complaints this AM but when gotten up to chair with PT developed abrupt onset chest pain "my heart hurts," that remained even after resting, worse with breathing, sharp and nonradiating. This seems to wax and wane. Feels like she can't breathe and says "I don't want to die, just let me go home."   Objective: Vitals:   09/08/20 1329 09/08/20 1415 09/08/20 1500 09/08/20 1504  BP: (!) 166/109 130/82    Pulse: (!) 103 (!) 125    Resp: 20 20    Temp: 97.6 F (36.4 C) (!) 97.5 F (36.4 C)    TempSrc: Oral Oral    SpO2: 92% (!) 82% 95% 92%  Weight:      Height:        Intake/Output Summary (Last 24 hours) at 09/08/2020 1521 Last data filed at 09/08/2020 0849 Gross per 24 hour  Intake 118 ml  Output 1450 ml  Net -1332 ml   Filed Weights   08/31/20 1934  Weight: 62.6 kg   Gen: Elderly, blind female in some distress Pulm: Tachypneic without accessory muscle use. Aeration in all zones, clear without wheezing. CV: Regular tachycardia. No murmur, rub, or gallop. No JVD, no dependent edema. GI: Abdomen soft, non-tender, non-distended, with normoactive bowel sounds.  Ext: Warm, no deformities Skin: No rashes, lesions or ulcers on visualized skin. Neuro: Alert and oriented. Blind without other focal neurological deficits. Psych: Judgement and insight appear modestly impaired, requiring frequent redirection.   Data Reviewed: I have personally reviewed following labs and imaging  studies  CBC: Recent Labs  Lab 09/02/20 0359 09/04/20 0636 09/05/20 0355 09/07/20 0310  WBC 5.7 7.2 8.5 10.8*  NEUTROABS 4.4 5.9 6.8 8.2*  HGB 14.3 12.6 12.6 15.4*  HCT 45.5 38.6 38.7 47.7*  MCV 98.9 95.3 96.3 96.4  PLT 141* 213 207 242   Basic Metabolic Panel: Recent Labs  Lab 09/02/20 0359 09/04/20 0636 09/05/20 0355 09/07/20 0310 09/08/20 0326  NA 142 144 144 139 137  K 3.6 3.9 4.1 4.0 4.0  CL 105 109 110 104 104  CO2 22 26 26 24  21*  GLUCOSE 79 155* 131* 151* 175*  BUN 10 22 17 9 8   CREATININE 0.68 0.54 0.58 0.54 0.39*  CALCIUM 8.0* 8.1* 7.8* 7.9* 7.5*   GFR: Estimated Creatinine Clearance: 43.7 mL/min (A) (by C-G formula based on SCr of 0.39 mg/dL (L)). Liver Function Tests: Recent Labs  Lab 09/02/20 0359 09/04/20 0636 09/05/20 0355 09/07/20 0310 09/08/20 0326  AST 53* 27 34 30 26  ALT 17 16 23  37 28  ALKPHOS 48 38 38 50 44  BILITOT  1.2 0.9 0.8 1.0 1.3*  PROT 6.0* 5.2* 4.9* 5.3* 4.8*  ALBUMIN 2.8* 2.2* 2.1* 2.4* 2.1*   CBG: Recent Labs  Lab 09/01/20 1654 09/01/20 2117 09/02/20 1711 09/03/20 0808 09/03/20 1201  GLUCAP 87 81 88 140* 151*   Urine analysis:    Component Value Date/Time   COLORURINE AMBER (A) 08/31/2020 2252   APPEARANCEUR CLEAR 08/31/2020 2252   LABSPEC 1.018 08/31/2020 2252   PHURINE 5.0 08/31/2020 2252   GLUCOSEU NEGATIVE 08/31/2020 2252   HGBUR SMALL (A) 08/31/2020 2252   BILIRUBINUR NEGATIVE 08/31/2020 2252   BILIRUBINUR neg 07/18/2014 1320   KETONESUR NEGATIVE 08/31/2020 2252   PROTEINUR 30 (A) 08/31/2020 2252   UROBILINOGEN 0.2 07/18/2014 1320   UROBILINOGEN 0.2 06/14/2014 1236   NITRITE NEGATIVE 08/31/2020 2252   LEUKOCYTESUR MODERATE (A) 08/31/2020 2252   Recent Results (from the past 240 hour(s))  Blood Culture (routine x 2)     Status: None   Collection Time: 08/31/20  8:10 PM   Specimen: BLOOD  Result Value Ref Range Status   Specimen Description   Final    BLOOD LEFT ANTECUBITAL Performed at [facility], [address], [place], [place] [address]    Special Requests   Final    BOTTLES DRAWN AEROBIC AND ANAEROBIC Blood Culture adequate volume Performed at [facility], [address], [place], [place] [address]    Culture   Final    NO GROWTH 5 DAYS Performed at [facility], [address], [place], [place] [address]    Report Status 09/05/2020 FINAL  Final  Resp Panel by RT-PCR (Flu A&B, Covid) Nasopharyngeal Swab     Status: Abnormal   Collection Time: 08/31/20  8:11 PM   Specimen: Nasopharyngeal Swab; Nasopharyngeal(NP) swabs in vial transport medium  Result Value Ref Range Status   SARS Coronavirus 2 by RT PCR POSITIVE (A) NEGATIVE Final    Comment: LACIVITA H. 12.12.21 @ 2321 BY MECIAL J. (NOTE) SARS-CoV-2 target nucleic acids are DETECTED.  The SARS-CoV-2 RNA is generally detectable in upper respiratory specimens during the acute phase of infection. Positive results are indicative of the presence of the identified virus, but do not rule out bacterial infection or co-infection with other pathogens not detected by the test. Clinical correlation with patient history and other diagnostic information is necessary to determine patient infection status. The expected result is Negative.  Fact Sheet for Patients: BloggerCourse.com  Fact Sheet for Healthcare Providers: SeriousBroker.it  This test is not yet approved or cleared by the [place] FDA and  has been authorized for detection and/or diagnosis of SARS-CoV-2 by FDA under an Emergency Use Authorization (EUA).  This EUA will remain in effect (meaning this test can be used) for the duration of  the COVID-19 de claration under Section 564(b)(1) of the Act, 21 U.S.C. section 360bbb-3(b)(1), unless the authorization is terminated or revoked sooner.     Influenza A by PCR NEGATIVE NEGATIVE Final   Influenza B by PCR NEGATIVE  NEGATIVE Final    Comment: (NOTE) The Xpert Xpress SARS-CoV-2/FLU/RSV plus assay is intended as an aid in the diagnosis of influenza from Nasopharyngeal swab specimens and should not be used as a sole basis for treatment. Nasal washings and aspirates are unacceptable for Xpert Xpress SARS-CoV-2/FLU/RSV testing.  Fact Sheet for Patients: BloggerCourse.com  Fact Sheet for Healthcare Providers: SeriousBroker.it  This test is not yet approved or cleared by the [place] and has been authorized for  detection and/or diagnosis of SARS-CoV-2 by FDA under an Emergency Use Authorization (EUA). This EUA will remain in effect (meaning this test can be used) for the duration of the COVID-19 declaration under Section 564(b)(1) of the Act, 21 U.S.C. section 360bbb-3(b)(1), unless the authorization is terminated or revoked.  Performed at [facility], [address], [place], [place] [address]   Urine culture     Status: None   Collection Time: 08/31/20 10:52 PM   Specimen: In/Out Cath Urine  Result Value Ref Range Status   Specimen Description   Final    IN/OUT CATH URINE Performed at [facility], [address] [place], [place] [address]    Special Requests   Final    NONE Performed at [facility], [address], [place], [place] [address]    Culture   Final    NO GROWTH Performed at [facility], [address], [place], [place] [address]    Report Status 09/01/2020 FINAL  Final  Blood Culture (routine x 2)     Status: Abnormal   Collection Time: 09/01/20 10:11 AM   Specimen: BLOOD LEFT HAND  Result Value Ref Range Status   Specimen Description   Final    BLOOD LEFT HAND Performed at [facility], [address] [place], [place] [address]    Special Requests   Final    BOTTLES DRAWN AEROBIC ONLY Blood Culture adequate volume Performed  at [facility], [address], [place], [place] [address]    Culture  Setup Time   Final    GRAM POSITIVE COCCI IN CLUSTERS AEROBIC BOTTLE ONLY Organism ID to follow CRITICAL RESULT CALLED TO, READ BACK BY AND VERIFIED WITH: D. Wofford PharmD 15:45 09/03/20 ([email or web address])    Culture (A)  Final    STAPHYLOCOCCUS CAPITIS THE SIGNIFICANCE OF ISOLATING THIS ORGANISM FROM A SINGLE SET OF BLOOD CULTURES WHEN MULTIPLE SETS ARE DRAWN IS UNCERTAIN. PLEASE NOTIFY THE MICROBIOLOGY DEPARTMENT WITHIN ONE WEEK IF SPECIATION AND SENSITIVITIES ARE REQUIRED. Performed at [facility], [address], [place], [place] [address]    Report Status 09/04/2020 FINAL  Final  Blood Culture ID Panel (Reflexed)     Status: Abnormal   Collection Time: 09/01/20 10:11 AM  Result Value Ref Range Status   Enterococcus faecalis NOT DETECTED NOT DETECTED Final   Enterococcus Faecium NOT DETECTED NOT DETECTED Final   Listeria monocytogenes NOT DETECTED NOT DETECTED Final   Staphylococcus species DETECTED (A) NOT DETECTED Final    Comment: CRITICAL RESULT CALLED TO, READ BACK BY AND VERIFIED WITH: D. Wofford PharmD 15:45 09/03/20 ([email or web address])    Staphylococcus aureus (BCID) NOT DETECTED NOT DETECTED Final   Staphylococcus epidermidis NOT DETECTED NOT DETECTED Final   Staphylococcus lugdunensis NOT DETECTED NOT DETECTED Final   Streptococcus species NOT DETECTED NOT DETECTED Final   Streptococcus agalactiae NOT DETECTED NOT DETECTED Final   Streptococcus pneumoniae NOT DETECTED NOT DETECTED Final   Streptococcus pyogenes NOT DETECTED NOT DETECTED Final   A.calcoaceticus-baumannii NOT DETECTED NOT DETECTED Final   Bacteroides fragilis NOT DETECTED NOT DETECTED Final   Enterobacterales NOT DETECTED NOT DETECTED Final   Enterobacter cloacae complex NOT DETECTED NOT DETECTED Final   Escherichia coli NOT DETECTED NOT DETECTED Final   Klebsiella aerogenes NOT DETECTED NOT DETECTED Final   Klebsiella  oxytoca NOT DETECTED NOT DETECTED Final   Klebsiella pneumoniae NOT DETECTED NOT DETECTED Final   Proteus species NOT DETECTED  NOT DETECTED Final   Salmonella species NOT DETECTED NOT DETECTED Final   Serratia marcescens NOT DETECTED NOT DETECTED Final   Haemophilus influenzae NOT DETECTED NOT DETECTED Final   Neisseria meningitidis NOT DETECTED NOT DETECTED Final   Pseudomonas aeruginosa NOT DETECTED NOT DETECTED Final   Stenotrophomonas maltophilia NOT DETECTED NOT DETECTED Final   Candida albicans NOT DETECTED NOT DETECTED Final   Candida auris NOT DETECTED NOT DETECTED Final   Candida glabrata NOT DETECTED NOT DETECTED Final   Candida krusei NOT DETECTED NOT DETECTED Final   Candida parapsilosis NOT DETECTED NOT DETECTED Final   Candida tropicalis NOT DETECTED NOT DETECTED Final   Cryptococcus neoformans/gattii NOT DETECTED NOT DETECTED Final    Comment: Performed at [facility], [address], [place], [place] [address]      Radiology Studies: DG CHEST PORT 1 VIEW  Result Date: 09/08/2020 CLINICAL DATA:  Worsening shortness of breath EXAM: PORTABLE CHEST 1 VIEW COMPARISON:  09/08/2020 at 7:32 a.m. FINDINGS: Single frontal view of the chest demonstrates stable enlargement the cardiac silhouette. Continued ectasia of the thoracic aorta and atherosclerosis unchanged. Bibasilar veiling opacities are again noted, with stable interstitial and ground-glass opacities. Overall, findings favor congestive heart failure. No pneumothorax. No acute bony abnormalities. IMPRESSION: 1. Stable findings of congestive heart failure. Electronically Signed   By: Sharlet Salina M.D.   On: 09/08/2020 15:07   DG CHEST PORT 1 VIEW  Result Date: 09/08/2020 CLINICAL DATA:  Acute hypoxic respiratory failure due to COVID EXAM: PORTABLE CHEST 1 VIEW COMPARISON:  Two days ago FINDINGS: Cardiomegaly and vascular pedicle widening accentuated by rightward rotation. Hazy appearance of the bilateral chest which  appears similar to before. No visible effusion or pneumothorax. The right-sided PICC is no longer seen. IMPRESSION: Stable bilateral pulmonary infiltrate Electronically Signed   By: Marnee Spring M.D.   On: 09/08/2020 07:56    Scheduled Meds:  aspirin EC  81 mg Oral Daily   Chlorhexidine Gluconate Cloth  6 each Topical Daily   enoxaparin (LOVENOX) injection  60 mg Subcutaneous Q12H   feeding supplement  237 mL Oral BID BM   methylPREDNISolone (SOLU-MEDROL) injection  40 mg Intravenous Q12H   pantoprazole  40 mg Oral Daily   Continuous Infusions:  sodium chloride 50 mL/hr at 09/07/20 2035     LOS: 7 days   Time spent: 35 minutes.  Tyrone Nine, MD [facility] [email or web address] 09/08/2020, 3:21 PM

## 2020-09-08 NOTE — Progress Notes (Signed)
Transferred patient to 4W ( 1440 ), report given to receiving RN. Transferred via stretcher with O2 at 15L [facility]NC and 15 FC.

## 2020-09-08 NOTE — Progress Notes (Signed)
Occupational Therapy Progress Note  Patient aware she is at [facility], perseverating on wanting to go home and to sleep. Requires multimodal max cues for bed mobility and max x2 to stand pivot to chair. Patient restless, repeatedly sitting upright in chair causing her to slide down in chair high risk of falling therefore transferred back to bed with max x2. OT + PT block patient lower extremities during transfers due to sliding forward. Patient desat to high 70s on 4L, RN ask to increase to 6L and once back to bed patient saturating in mid 80s.     09/08/20 1516  OT Visit Information  Last OT Received On 09/08/20  Assistance Needed +2  PT/OT/SLP Co-Evaluation/Treatment Yes  Reason for Co-Treatment Necessary to address cognition/behavior during functional activity;For patient/therapist safety;To address functional/ADL transfers  OT goals addressed during session ADL's and self-care  History of Present Illness Patient is an 86 year old female hx of dementia, T2DM, HTN, CVA, PVD, AAA who presented to the ED 12/13 with generalized weakness. Patient positive for COVID  Precautions  Precautions Fall  Precaution Comments currently on 4L, patient is Blind  Pain Assessment  Pain Assessment Faces  Faces Pain Scale 0  Cognition  Arousal/Alertness Awake/alert  Behavior During Therapy Anxious;Restless  Overall Cognitive Status No family/caregiver present to determine baseline cognitive functioning  Area of Impairment Orientation;Following commands;Problem solving  Orientation Level Disoriented to;Time;Situation  Following Commands Follows one step commands inconsistently  Problem Solving Decreased initiation;Difficulty sequencing;Requires verbal cues;Requires tactile cues  General Comments patient perseverating on wanting to go home knows she is at [facility] impulsive, restless in the chair high fall risk  ADL  Overall ADL's  Needs assistance/impaired  Grooming Total assistance;Sitting  Grooming  Details (indicate cue type and reason) to wipe face, patient in B mits  Toilet Transfer Maximal assistance;+2 for physical assistance;+2 for safety/equipment;Stand-pivot;Cueing for safety;Cueing for sequencing  Toilet Transfer Details (indicate cue type and reason) zero standing balance with LEs sliding requiring PT and OT to block LEs while perform pivot from chair, then back to bed due to patient being unsafe in chair  Bed Mobility  Overal bed mobility Needs Assistance  Bed Mobility Supine to Sit;Sit to Supine  Supine to sit Mod assist;Max assist;+2 for safety/equipment;+2 for physical assistance  Sit to supine Mod assist;+2 for physical assistance;+2 for safety/equipment  General bed mobility comments multimodal cues to encourage patient to participate in bed mobility, needs assist to manage LEs and trunk  Balance  Overall balance assessment Needs assistance  Sitting-balance support Feet supported;Bilateral upper extremity supported  Sitting balance-Leahy Scale Poor  Sitting balance - Comments leans back, with cues will sit more upright.  Standing balance support Bilateral upper extremity supported  Standing balance-Leahy Scale Zero  Transfers  Overall transfer level Needs assistance  Equipment used 2 person hand held assist  Transfers Sit to/from Stand;Stand Pivot Transfers  Sit to Stand Max assist;+2 physical assistance;+2 safety/equipment  Stand pivot transfers Max assist;+2 physical assistance;+2 safety/equipment  General transfer comment requires feet blocked to prevent sliding. Max assist to stand and pivot to recliner. Had to assist back to bed with 2 max assistance due to unsafe in chair, starting to slide down from chair  General Comments  General comments (skin integrity, edema, etc.) patient desaturate to high 70s after initial chair transfer on 4L, RN aware and asked to increase patient to 6L with saturations in mid 80s with RN aware  OT - End of Session  Equipment Utilized  During Treatment  Oxygen  Activity Tolerance Patient limited by fatigue  Patient left in bed;with call bell/phone within reach;with bed alarm set;Other (comment) (mits)  Nurse Communication Mobility status;Other (comment) (O2 saturations)  OT Assessment/Plan  OT Plan Discharge plan remains appropriate  OT Visit Diagnosis Unsteadiness on feet (R26.81);Other abnormalities of gait and mobility (R26.89);Muscle weakness (generalized) (M62.81);Other symptoms and signs involving cognitive function  OT Frequency (ACUTE ONLY) Min 2X/week  Follow Up Recommendations SNF  OT Equipment Other (comment) (unsure of home DME)  AM-PAC OT "6 Clicks" Daily Activity Outcome Measure (Version 2)  Help from another person eating meals? 1  Help from another person taking care of personal grooming? 1  Help from another person toileting, which includes using toliet, bedpan, or urinal? 1  Help from another person bathing (including washing, rinsing, drying)? 1  Help from another person to put on and taking off regular upper body clothing? 1  Help from another person to put on and taking off regular lower body clothing? 1  6 Click Score 6  OT Goal Progression  Progress towards OT goals Not progressing toward goals - comment (cognition)  Acute Rehab OT Goals  Patient Stated Goal I am sick  OT Goal Formulation With patient  Time For Goal Achievement 09/16/20  Potential to Achieve Goals Fair  ADL Goals  Pt Will Perform Upper Body Bathing with min guard assist;sitting  Pt Will Transfer to Toilet with min assist;bedside commode;stand pivot transfer  Pt Will Perform Toileting - Clothing Manipulation and hygiene with mod assist;sitting/lateral leans;sit to/from stand  OT Time Calculation  OT Start Time (ACUTE ONLY) 1050  OT Stop Time (ACUTE ONLY) 1132  OT Time Calculation (min) 42 min  OT General Charges  $OT Visit 1 Visit  OT Treatments  $Self Care/Home Management  8-22 mins   Marlyce Huge OT OT pager:  [phone]

## 2020-09-09 ENCOUNTER — Inpatient Hospital Stay (HOSPITAL_COMMUNITY): Payer: Medicare Other

## 2020-09-09 DIAGNOSIS — J9601 Acute respiratory failure with hypoxia: Secondary | ICD-10-CM

## 2020-09-09 DIAGNOSIS — Z7189 Other specified counseling: Secondary | ICD-10-CM

## 2020-09-09 DIAGNOSIS — Z515 Encounter for palliative care: Secondary | ICD-10-CM

## 2020-09-09 DIAGNOSIS — I214 Non-ST elevation (NSTEMI) myocardial infarction: Secondary | ICD-10-CM

## 2020-09-09 DIAGNOSIS — R531 Weakness: Secondary | ICD-10-CM

## 2020-09-09 LAB — CBC WITH DIFFERENTIAL/PLATELET
Abs Immature Granulocytes: 0.4 10*3/uL — ABNORMAL HIGH (ref 0.00–0.07)
Basophils Absolute: 0 10*3/uL (ref 0.0–0.1)
Basophils Relative: 0 %
Eosinophils Absolute: 0 10*3/uL (ref 0.0–0.5)
Eosinophils Relative: 0 %
HCT: 41 % (ref 36.0–46.0)
Hemoglobin: 13.5 g/dL (ref 12.0–15.0)
Immature Granulocytes: 4 %
Lymphocytes Relative: 6 %
Lymphs Abs: 0.6 10*3/uL — ABNORMAL LOW (ref 0.7–4.0)
MCH: 31 pg (ref 26.0–34.0)
MCHC: 32.9 g/dL (ref 30.0–36.0)
MCV: 94 fL (ref 80.0–100.0)
Monocytes Absolute: 0.5 10*3/uL (ref 0.1–1.0)
Monocytes Relative: 5 %
Neutro Abs: 7.9 10*3/uL — ABNORMAL HIGH (ref 1.7–7.7)
Neutrophils Relative %: 85 %
Platelets: 250 10*3/uL (ref 150–400)
RBC: 4.36 MIL/uL (ref 3.87–5.11)
RDW: 13.2 % (ref 11.5–15.5)
WBC: 9.3 10*3/uL (ref 4.0–10.5)
nRBC: 0 % (ref 0.0–0.2)

## 2020-09-09 LAB — ECHOCARDIOGRAM LIMITED
Area-P 1/2: 5.66 cm2
Height: 65 in
S' Lateral: 3.5 cm
Weight: 2264.57 oz

## 2020-09-09 LAB — COMPREHENSIVE METABOLIC PANEL WITH GFR
ALT: 25 U/L (ref 0–44)
AST: 25 U/L (ref 15–41)
Albumin: 2.2 g/dL — ABNORMAL LOW (ref 3.5–5.0)
Alkaline Phosphatase: 43 U/L (ref 38–126)
Anion gap: 11 (ref 5–15)
BUN: 12 mg/dL (ref 8–23)
CO2: 25 mmol/L (ref 22–32)
Calcium: 7.5 mg/dL — ABNORMAL LOW (ref 8.9–10.3)
Chloride: 100 mmol/L (ref 98–111)
Creatinine, Ser: 0.58 mg/dL (ref 0.44–1.00)
GFR, Estimated: >60 mL/min
Glucose, Bld: 215 mg/dL — ABNORMAL HIGH (ref 70–99)
Potassium: 3.5 mmol/L (ref 3.5–5.1)
Sodium: 136 mmol/L (ref 135–145)
Total Bilirubin: 0.9 mg/dL (ref 0.3–1.2)
Total Protein: 5.1 g/dL — ABNORMAL LOW (ref 6.5–8.1)

## 2020-09-09 LAB — C-REACTIVE PROTEIN: CRP: 5.6 mg/dL — ABNORMAL HIGH (ref <1.0)

## 2020-09-09 LAB — TROPONIN I (HIGH SENSITIVITY)
Troponin I (High Sensitivity): 2788 ng/L (ref <18)
Troponin I (High Sensitivity): 2651 ng/L (ref <18)

## 2020-09-09 MED ORDER — FUROSEMIDE 10 MG/ML IJ SOLN
80.0000 mg | Freq: Two times a day (BID) | INTRAMUSCULAR | Status: DC
  Administered 2020-09-09 – 2020-09-10 (×4): 80 mg via INTRAVENOUS
  Filled 2020-09-09 (×4): qty 8

## 2020-09-09 MED ORDER — SODIUM CHLORIDE 0.9% FLUSH
10.0000 mL | INTRAVENOUS | Status: DC | PRN
  Administered 2020-09-12: 10 mL

## 2020-09-09 MED ORDER — SODIUM CHLORIDE 0.9% FLUSH
10.0000 mL | Freq: Two times a day (BID) | INTRAVENOUS | Status: DC
  Administered 2020-09-10 – 2020-09-11 (×3): 10 mL

## 2020-09-09 NOTE — Progress Notes (Signed)
PROGRESS NOTE  Marnell Mcdaniel  WGN:[id number] DOB: 01/19/1930 DOA: 08/31/2020 PCP: Alysia Penna, MD   Brief Narrative: Britiney Blahnik is an 86 y.o. female with a history of dementia, T2DM, HTN, CVA, PVD, AAA who presented to the ED 12/13 with generalized weakness. In the ED she was febrile with elevated lactic acid improved with fluids. CT head nonacute. Urinalysis suggested UTI for which ceftriaxone was given. SARS-CoV-2 PCR was positive, though CXR was without infiltrates and patient without respiratory symptoms/signs or hypoxia. She was admitted, found to have acute DVT of the right popliteal vein for which anticoagulation was started. On 12/21 the patient experienced abrupt onset chest pain and dyspnea with worsening hypoxia due to flash pulmonary edema due to NSTEMI. Cardiology is consulted and medical management is ongoing.  Assessment & Plan: Principal Problem:   COVID-19 virus infection Active Problems:   Essential hypertension   MCI (mild cognitive impairment)   Aortic dissection (HCC)   Controlled type 2 diabetes mellitus with hyperglycemia (HCC)   COVID   Acute lower UTI   Acute hypoxemic respiratory failure due to COVID-19 [facility])   Non-ST elevation (NSTEMI) myocardial infarction [facility])   Acute respiratory failure with hypoxia (HCC)  Acute hypoxemic respiratory failure due to covid-19 pneumonia: SARS-CoV-2 PCR positive on 12/13.  - Completed remdesivir (12/15 - 12/19) - Continue steroids (started 12/15) - No indication for baricitinib - Encourage OOB, IS, FV, and awake proning if able - Continue airborne, contact precautions for 21 days from positive testing.  Flash pulmonary edema, acute HFrEF due to NSTEMI: Echo with new wall motion abnormalities - Lasix to be continued, measure I/O, daily weights - Trend troponin, metoprolol (will change to succinate), prn NTG. - Cardiology consulted, pt to remain on anticoagulation. Not a candidate for invasive intervention based on poor  risk profile including penetrating thoracic aortic ulceration.  Acute right popliteal vein DVT:  - Continue anticoagulation, change to heparin gtt until troponin trending downward.  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   UTI: Sepsis has been ruled out. - s/p ceftriaxone, cultures without growth  1 of 4 blood cultures grew S. capitis, consistent with contaminant.   Lymphopenia, thrombocytopenia:  - Due to covid, resolved.   AKI: Resolved.  - Continue monitoring.   Generalized weakness, poor per oral intake: Suspected sequelae of covid infection.  - PT recommends SNF for rehabilitation, though the patient and family decline this and wish to take the patient home.  Malpositioned PICC line: Based on XR 12/19, subsequently pulled.  Poor IV access, difficult stick:  - Pt will require PICC line given frequent lab monitoring and continuous heparin infusion.  Dementia: Stable, may be more accurately MCI.  DVT prophylaxis: Lovenox Code Status: DNR Family Communication: Niece who is caretaker updated by phone. I've shared the patient's poor prognosis with her, confirmed DNR and desire not to escalate care. Also outlined reasons that we are unable to intervene further on ischemic heart disease.  Disposition Plan:  Status is: Inpatient  Remains inpatient appropriate because:Unsafe d/c plan and Inpatient level of care appropriate due to severity of illness  Dispo: The patient is from: Home              Anticipated d/c is to: Home              Anticipated d/c date is: > 3 days  Patient currently is not medically stable to d/c.  Consultants:   Palliative care  Cardiology  Procedures:   None  Antimicrobials:  Ceftriaxone, remdesivir   Subjective: Chest pain resolved, though still with shortness of breath even at rest  worse than previously. Otherwise no complaints.   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 CV: Regular rate and rhythm. No murmur, rub, or gallop. No JVD, trace dependent edema. GI: Abdomen soft, non-tender, non-distended, with normoactive bowel sounds.  Ext: Warm, no deformities Skin: No rashes, lesions or ulcers on visualized skin. Neuro: Alert and oriented. Blind, otherwise no focal neurological deficits. Psych: Judgement and insight appear fair. Mood euthymic & affect congruent. Behavior is appropriate.    Data Reviewed: I have personally reviewed following labs and imaging studies  CBC: Recent Labs  Lab 09/04/20 0636 09/05/20 0355 09/07/20 0310 09/09/20 0515  WBC 7.2 8.5 10.8* 9.3  NEUTROABS 5.9 6.8 8.2* 7.9*  HGB 12.6 12.6 15.4* 13.5  HCT 38.6 38.7 47.7* 41.0  MCV 95.3 96.3 96.4 94.0  PLT 213 207 242 250   Basic Metabolic Panel: Recent Labs  Lab 09/04/20 0636 09/05/20 0355 09/07/20 0310 09/08/20 0326 09/09/20 0515  NA 144 144 139 137 136  K 3.9 4.1 4.0 4.0 3.5  CL 109 110 104 104 100  CO2 26 26 24  21* 25  GLUCOSE 155* 131* 151* 175* 215*  BUN 22 17 9 8 12   CREATININE 0.54 0.58 0.54 0.39* 0.58  CALCIUM 8.1* 7.8* 7.9* 7.5* 7.5*   GFR: Estimated Creatinine Clearance: 43.7 mL/min (by C-G formula based on SCr of 0.58 mg/dL). Liver Function Tests: Recent Labs  Lab 09/04/20 0636 09/05/20 0355 09/07/20 0310 09/08/20 0326  09/09/20 0515  AST 27 34 30 26 25   ALT 16 23 37 28 25  ALKPHOS 38 38 50 44 43  BILITOT 0.9 0.8 1.0 1.3* 0.9  PROT 5.2* 4.9* 5.3* 4.8* 5.1*  ALBUMIN 2.2* 2.1* 2.4* 2.1* 2.2*   CBG: Recent Labs  Lab 09/02/20 1711 09/03/20 0808 09/03/20 1201  GLUCAP 88 140* 151*   Urine analysis:    Component Value Date/Time   COLORURINE AMBER (A) 08/31/2020 2252   APPEARANCEUR CLEAR 08/31/2020 2252   LABSPEC 1.018 08/31/2020 2252   PHURINE 5.0 08/31/2020 2252   GLUCOSEU NEGATIVE 08/31/2020 2252   HGBUR SMALL (A) 08/31/2020 2252   BILIRUBINUR NEGATIVE 08/31/2020 2252   BILIRUBINUR neg 07/18/2014 1320   KETONESUR NEGATIVE 08/31/2020 2252   PROTEINUR 30 (A) 08/31/2020 2252   UROBILINOGEN 0.2 07/18/2014 1320   UROBILINOGEN 0.2 06/14/2014 1236   NITRITE NEGATIVE 08/31/2020 2252   LEUKOCYTESUR MODERATE (A) 08/31/2020 2252   Recent Results (from the past 240 hour(s))  Blood Culture (routine x 2)     Status: None  Collection Time: 08/31/20  8:10 PM   Specimen: BLOOD  Result Value Ref Range Status   Specimen Description   Final    BLOOD LEFT ANTECUBITAL Performed at [facility], [address], [place], [place] [address]    Special Requests   Final    BOTTLES DRAWN AEROBIC AND ANAEROBIC Blood Culture adequate volume Performed at [facility], [address] [place], [place] [address]    Culture   Final    NO GROWTH 5 DAYS Performed at [facility], [address], [place], [place] [address]    Report Status 09/05/2020 FINAL  Final  Resp Panel by RT-PCR (Flu A&B, Covid) Nasopharyngeal Swab     Status: Abnormal   Collection Time: 08/31/20  8:11 PM   Specimen: Nasopharyngeal Swab; Nasopharyngeal(NP) swabs in vial transport medium  Result Value Ref Range Status   SARS Coronavirus 2 by RT PCR POSITIVE (A) NEGATIVE Final    Comment: LACIVITA H. 12.12.21 @ 2321 BY MECIAL J. (NOTE) SARS-CoV-2 target nucleic acids are DETECTED.  The SARS-CoV-2  RNA is generally detectable in upper respiratory specimens during the acute phase of infection. Positive results are indicative of the presence of the identified virus, but do not rule out bacterial infection or co-infection with other pathogens not detected by the test. Clinical correlation with patient history and other diagnostic information is necessary to determine patient infection status. The expected result is Negative.  Fact Sheet for Patients: BloggerCourse.com  Fact Sheet for Healthcare Providers: SeriousBroker.it  This test is not yet approved or cleared by the [place] FDA and  has been authorized for detection and/or diagnosis of SARS-CoV-2 by FDA under an Emergency Use Authorization (EUA).  This EUA will remain in effect (meaning this test can be used) for the duration of  the COVID-19 de claration under Section 564(b)(1) of the Act, 21 U.S.C. section 360bbb-3(b)(1), unless the authorization is terminated or revoked sooner.     Influenza A by PCR NEGATIVE NEGATIVE Final   Influenza B by PCR NEGATIVE NEGATIVE Final    Comment: (NOTE) The Xpert Xpress SARS-CoV-2/FLU/RSV plus assay is intended as an aid in the diagnosis of influenza from Nasopharyngeal swab specimens and should not be used as a sole basis for treatment. Nasal washings and aspirates are unacceptable for Xpert Xpress SARS-CoV-2/FLU/RSV testing.  Fact Sheet for Patients: BloggerCourse.com  Fact Sheet for Healthcare Providers: SeriousBroker.it  This test is not yet approved or cleared by the [place] FDA and has been authorized for detection and/or diagnosis of SARS-CoV-2 by FDA under an Emergency Use Authorization (EUA). This EUA will remain in effect (meaning this test can be used) for the duration of the COVID-19 declaration under Section 564(b)(1) of the Act, 21 U.S.C. section  360bbb-3(b)(1), unless the authorization is terminated or revoked.  Performed at [facility], [address] [place], [place] [address]   Urine culture     Status: None   Collection Time: 08/31/20 10:52 PM   Specimen: In/Out Cath Urine  Result Value Ref Range Status   Specimen Description   Final    IN/OUT CATH URINE Performed at [facility], [address] [place], [place] [address]    Special Requests   Final    NONE Performed at [facility], [address] [place], [place] [address]    Culture   Final    NO GROWTH Performed at [facility], [address], [place], [place] [address]    Report Status 09/01/2020 FINAL  Final  Blood Culture (routine x 2)     Status: Abnormal   Collection Time: 09/01/20 10:11 AM   Specimen: BLOOD LEFT HAND  Result Value Ref Range Status   Specimen Description   Final    BLOOD LEFT HAND Performed at [facility], [address], [place], [place] [address]    Special Requests   Final    BOTTLES DRAWN AEROBIC ONLY Blood Culture adequate volume Performed at [facility], [address] [place], [place] [address]    Culture  Setup Time   Final    GRAM POSITIVE COCCI IN CLUSTERS AEROBIC BOTTLE ONLY Organism ID to follow CRITICAL RESULT CALLED TO, READ BACK BY AND VERIFIED WITH: D. Wofford PharmD 15:45 09/03/20 ([email or web address])    Culture (A)  Final    STAPHYLOCOCCUS CAPITIS THE SIGNIFICANCE OF ISOLATING THIS ORGANISM FROM A SINGLE SET OF BLOOD CULTURES WHEN MULTIPLE SETS ARE DRAWN IS UNCERTAIN. PLEASE NOTIFY THE MICROBIOLOGY DEPARTMENT WITHIN ONE WEEK IF SPECIATION AND SENSITIVITIES ARE REQUIRED. Performed at [facility], [address] [place], [place] [address]    Report Status 09/04/2020 FINAL  Final  Blood Culture ID Panel (Reflexed)     Status: Abnormal   Collection Time: 09/01/20 10:11 AM  Result Value Ref Range Status    Enterococcus faecalis NOT DETECTED NOT DETECTED Final   Enterococcus Faecium NOT DETECTED NOT DETECTED Final   Listeria monocytogenes NOT DETECTED NOT DETECTED Final   Staphylococcus species DETECTED (A) NOT DETECTED Final    Comment: CRITICAL RESULT CALLED TO, READ BACK BY AND VERIFIED WITH: D. Wofford PharmD 15:45 09/03/20 ([email or web address])    Staphylococcus aureus (BCID) NOT DETECTED NOT DETECTED Final   Staphylococcus epidermidis NOT DETECTED NOT DETECTED Final   Staphylococcus lugdunensis NOT DETECTED NOT DETECTED Final   Streptococcus species NOT DETECTED NOT DETECTED Final   Streptococcus agalactiae NOT DETECTED NOT DETECTED Final   Streptococcus pneumoniae NOT DETECTED NOT DETECTED Final   Streptococcus pyogenes NOT DETECTED NOT DETECTED Final   A.calcoaceticus-baumannii NOT DETECTED NOT DETECTED Final   Bacteroides fragilis NOT DETECTED NOT DETECTED Final   Enterobacterales NOT DETECTED NOT DETECTED Final   Enterobacter cloacae complex NOT DETECTED NOT DETECTED Final   Escherichia coli NOT DETECTED NOT DETECTED Final   Klebsiella aerogenes NOT DETECTED NOT DETECTED Final   Klebsiella oxytoca NOT DETECTED NOT DETECTED Final   Klebsiella pneumoniae NOT DETECTED NOT DETECTED Final   Proteus species NOT DETECTED NOT DETECTED Final   Salmonella species NOT DETECTED NOT DETECTED Final   Serratia marcescens NOT DETECTED NOT DETECTED Final   Haemophilus influenzae NOT DETECTED NOT DETECTED Final   Neisseria meningitidis NOT DETECTED NOT DETECTED Final   Pseudomonas aeruginosa NOT DETECTED NOT DETECTED Final   Stenotrophomonas maltophilia NOT DETECTED NOT DETECTED Final   Candida albicans NOT DETECTED NOT DETECTED Final   Candida auris NOT DETECTED NOT DETECTED Final   Candida glabrata NOT DETECTED NOT DETECTED Final   Candida krusei NOT DETECTED NOT DETECTED Final   Candida parapsilosis NOT DETECTED NOT DETECTED Final   Candida tropicalis NOT DETECTED NOT DETECTED Final   Cryptococcus  neoformans/gattii NOT DETECTED NOT DETECTED Final    Comment: Performed at [facility], [address], [place], [place] [address]      Radiology Studies: DG CHEST PORT 1 VIEW  Result Date: 09/08/2020 CLINICAL DATA:  Worsening shortness of breath EXAM: PORTABLE CHEST 1  VIEW COMPARISON:  09/08/2020 at 7:32 a.m. FINDINGS: Single frontal view of the chest demonstrates stable enlargement the cardiac silhouette. Continued ectasia of the thoracic aorta and atherosclerosis unchanged. Bibasilar veiling opacities are again noted, with stable interstitial and ground-glass opacities. Overall, findings favor congestive heart failure. No pneumothorax. No acute bony abnormalities. IMPRESSION: 1. Stable findings of congestive heart failure. Electronically Signed   By: Sharlet Salina M.D.   On: 09/08/2020 15:07   DG CHEST PORT 1 VIEW  Result Date: 09/08/2020 CLINICAL DATA:  Acute hypoxic respiratory failure due to COVID EXAM: PORTABLE CHEST 1 VIEW COMPARISON:  Two days ago FINDINGS: Cardiomegaly and vascular pedicle widening accentuated by rightward rotation. Hazy appearance of the bilateral chest which appears similar to before. No visible effusion or pneumothorax. The right-sided PICC is no longer seen. IMPRESSION: Stable bilateral pulmonary infiltrate Electronically Signed   By: Marnee Spring M.D.   On: 09/08/2020 07:56   ECHOCARDIOGRAM LIMITED  Result Date: 09/09/2020    ECHOCARDIOGRAM LIMITED REPORT   Patient Name:   APRYLE STOWELL Date of Exam: 09/09/2020 Medical Rec #:  [id number]    Height:       65.0 in Accession #:    [id number]   Weight:       141.5 lb Date of Birth:  09-25-1929    BSA:          1.708 m Patient Age:    89 years     BP:           143/72 mmHg Patient Gender: F            HR:           86 bpm. Exam Location:  Inpatient Procedure: Limited Echo, Cardiac Doppler and Color Doppler Indications:    Shortness of breath  History:        Patient has prior history of Echocardiogram  examinations, most                 recent 08/17/2018. PAD and Stroke, Signs/Symptoms:Shortness of                 Breath and Pul. edema, Dementia; Risk Factors:Hypertension,                 Diabetes and Dyslipidemia. Covid+.  Sonographer:    Lavenia Atlas Referring Phys: [phone] Tyrone Nine IMPRESSIONS  1. There is severe hypokinesis of the mid septum, all apical segments, and apex. Due to moderate LVH, EF appears to be 30-35% but difficult to assess. Wall motion abnormalities are new. This pattern can be seen in a stress-induced cardiomyopathy (Takotsubo) vs large wrap around LAD infarction. Clinical correlation is recommended. Left ventricular ejection fraction, by estimation, is 30 to 35%. The left ventricle has moderately decreased function. The left ventricle demonstrates regional wall motion abnormalities (see scoring diagram/findings for description). There is moderate concentric left ventricular hypertrophy. Left ventricular diastolic parameters are consistent with Grade I diastolic dysfunction (impaired relaxation).  2. Right ventricular systolic function is normal. The right ventricular size is normal.  3. The mitral valve is grossly normal. No evidence of mitral valve regurgitation. No evidence of mitral stenosis.  4. The aortic valve is tricuspid. There is mild calcification of the aortic valve. There is mild thickening of the aortic valve. Mild to moderate aortic valve sclerosis/calcification is present, without any evidence of aortic stenosis.  5. The inferior vena cava is normal in size with greater than 50% respiratory variability, suggesting right atrial pressure of  3 mmHg. Comparison(s): Changes from prior study are noted. EF 35-40% with new WMA. FINDINGS  Left Ventricle: There is severe hypokinesis of the mid septum, all apical segments, and apex. Due to moderate LVH, EF appears to be 30-35% but difficult to assess. Wall motion abnormalities are new. This pattern can be seen in a stress-induced  cardiomyopathy (Takotsubo) vs large wrap around LAD infarction. Clinical correlation is recommended. Left ventricular ejection fraction, by estimation, is 30 to 35%. The left ventricle has moderately decreased function. The left ventricle demonstrates regional wall motion abnormalities. The left ventricular internal cavity size was normal in size. There is moderate concentric left ventricular hypertrophy. Left ventricular diastolic parameters are consistent with Grade I diastolic dysfunction (impaired  relaxation).  LV Wall Scoring: The mid and distal anterior septum and entire apex are hypokinetic. Right Ventricle: The right ventricular size is normal. No increase in right ventricular wall thickness. Right ventricular systolic function is normal. Left Atrium: Left atrial size was normal in size. Right Atrium: Right atrial size was normal in size. Mitral Valve: The mitral valve is grossly normal. No evidence of mitral valve stenosis. Tricuspid Valve: The tricuspid valve is not well visualized. Aortic Valve: The aortic valve is tricuspid. There is mild calcification of the aortic valve. There is mild thickening of the aortic valve. Mild to moderate aortic valve sclerosis/calcification is present, without any evidence of aortic stenosis. Pulmonic Valve: The pulmonic valve was not well visualized. Venous: The inferior vena cava is normal in size with greater than 50% respiratory variability, suggesting right atrial pressure of 3 mmHg. IAS/Shunts: The atrial septum is grossly normal. Additional Comments: There is a small pleural effusion in the left lateral region. LEFT VENTRICLE PLAX 2D LVIDd:         4.00 cm  Diastology LVIDs:         3.50 cm  LV e' medial:    7.07 cm/s LV PW:         1.40 cm  LV E/e' medial:  6.7 LV IVS:        1.60 cm  LV e' lateral:   7.40 cm/s LVOT diam:     2.10 cm  LV E/e' lateral: 6.4 LVOT Area:     3.46 cm  LEFT ATRIUM         Index LA diam:    4.50 cm 2.63 cm/m   AORTA Ao Root diam: 3.40 cm  MITRAL VALVE MV Area (PHT): 5.66 cm    SHUNTS MV Decel Time: 134 msec    Systemic Diam: 2.10 cm MV E velocity: 47.50 cm/s MV A velocity: 82.50 cm/s MV E/A ratio:  0.58 Lennie Odor MD Electronically signed by Lennie Odor MD Signature Date/Time: 09/09/2020/12:25:08 PM    Final    [place] EKG SITE RITE  Result Date: 09/08/2020 If Site Rite image not attached, placement could not be confirmed due to current cardiac rhythm.   Scheduled Meds:  aspirin EC  81 mg Oral Daily   Chlorhexidine Gluconate Cloth  6 each Topical Daily   enoxaparin (LOVENOX) injection  60 mg Subcutaneous Q12H   feeding supplement  237 mL Oral BID BM   furosemide  80 mg Intravenous BID   methylPREDNISolone (SOLU-MEDROL) injection  40 mg Intravenous Q12H   metoprolol tartrate  12.5 mg Oral BID   pantoprazole  40 mg Oral Daily   Continuous Infusions:    LOS: 8 days   Time spent: 35 minutes.  Tyrone Nine, MD [facility] [email or web address] 09/09/2020,  1:55 PM

## 2020-09-09 NOTE — Progress Notes (Signed)
Spoke with pt's niece Britta Mccreedy and very appreciative of the call.

## 2020-09-09 NOTE — Progress Notes (Signed)
  Echocardiogram 2D Echocardiogram has been performed.  Kaitlin Smith 09/09/2020, 11:51 AM

## 2020-09-09 NOTE — Consult Note (Signed)
Cardiology Consultation:   Patient ID: Kaitlin Smith; [id number]; April 12, 1930   Admit date: 08/31/2020 Date of Consult: 09/09/2020  Primary Care Provider: Alysia PennaHolwerda, Scott, MD Primary Cardiologist: Dr. Nanetta BattyJonathan Berry, MD   Patient Profile:   Kaitlin Smith is a 86 y.o. 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 Jarvis NewcomerGrunz.   History of Present Illness:   Kaitlin Smith is an 86 year old female with a history stated above who [facility] with generalized weakness found to be febrile with an elevated lactic acid.  She was found to be Covid positive on presentation with CXR without infiltrates or respiratory symptoms.  She was also found to have acute DVT of the right popliteal vein for which she has been anticoagulated.  Unfortunately on 09/08/2020 she had acute worsening of her symptoms with evolving lobar consolidation suggestive of bacterial infection.  There was concern for acute, flash pulmonary edema therefore she was treated with IV Lasix.  Stat troponin was drawn which was elevated at 96 with a repeat at 1188.  EKG was stable with no acute ST changes.  She was given ASA and SL NTG along with therapeutic Lovenox.  PICC line was placed for IV access for IV heparin.  She is followed by Dr. Allyson SabalBerry for cardiology care.  [place].  She was referred to Dr. Allyson SabalBerry by her podiatrist for the evaluation of peripheral arterial disease due to nonpalpable pulses.  She has significant PAD with abnormal Doppler studies performed in 2015 with high-grade bilateral SFA disease and severe tibial  vessel disease.  She has remained chronically stable for this.  She also has a history of CVA with occlusive right and left carotid arterial disease who has refused further studies.  Per Dr. Hazle CocaBerry's report and EKG tracing, she apparently had an out of hospital MI in 2014 and there is evidence of coronary calcifications on chest CTA.  She underwent a nuclear stress test 11/07/2014 which showed no evidence of ischemia or scarring.  Echocardiogram from 08/17/2018 with an LVEF at 60 to 65% with G1 DD, LVH and no valvular disease.  She is also followed by vascular service, Dr. Randie Heinzain for a known penetrating aortic ulceration with possible dissection flap of the distal thoracic aorta diagnosed 10/31/2017 not felt to be a surgical candidate.  She was last seen by Dr. Randie Heinzain 11/30/2018 at which time she was stable without symptoms therefore plan was for no surgical intervention and follow-up with serial imaging.   Past Medical History:  Diagnosis Date  . Allergy   . Arthritis   . Blindness   . Coronary artery disease   . Diabetes mellitus without complication (HCC)   . Glaucoma   . Hyperlipidemia   . Hypertension   . Myocardial infarction (HCC)   . Peripheral vascular disease (HCC)   . Stroke [facility](HCC)     Past Surgical History:  Procedure Laterality Date  . EYE SURGERY       Prior to Admission medications   Medication Sig Start Date End Date Taking? Authorizing Provider  acetaminophen (TYLENOL) 325 MG tablet Take 2 tablets (650 mg total) by mouth every 6 (six)  hours as needed for mild pain (temp > 101.5). 08/22/18  Yes Elgergawy, Leana Roe, MD  amLODipine (NORVASC) 10 MG tablet Take 1 tablet (10 mg total) by mouth daily. 07/23/19  Yes Runell Gess, MD  clopidogrel (PLAVIX) 75 MG tablet TAKE 1 TABLET (75 MG TOTAL) BY MOUTH DAILY. 10/21/19  Yes Runell Gess, MD  gabapentin (NEURONTIN) 300 MG capsule Take 300 mg by mouth daily. 11/19/19  Yes [provider]  hydrALAZINE (APRESOLINE) 25 MG tablet  Take 1 tablet (25 mg total) by mouth 2 (two) times daily. 08/22/18  Yes Elgergawy, Leana Roe, MD  insulin glargine (LANTUS) 100 UNIT/ML injection Inject 0-0.1 mLs (0-10 Units total) into the skin at bedtime. Sliding scale Patient taking differently: Inject 0-10 Units into the skin See admin instructions. Per Sliding scale 11/12/15  Yes Holland Commons A, NP  insulin lispro (HUMALOG) 100 UNIT/ML injection INJECT 0.1 MILLILITERS (10 UNITS) INTO THE SKIN 3 TIMES A DAY WITH MEALS Patient taking differently: Inject 0-10 Units into the skin See admin instructions. Per Sliding Scale 3 TIMES A DAY WITH MEALS 10/29/16  Yes Weber, Sarah L, PA-C  pantoprazole (PROTONIX) 40 MG tablet TAKE 1 TABLET BY MOUTH EVERY DAY 11/15/19  Yes Runell Gess, MD  calcium carbonate (OSCAL) 1500 (600 Ca) MG TABS tablet Take 1,500 mg by mouth daily with breakfast.    [provider]  cloNIDine (CATAPRES) 0.2 MG tablet Take 1 tablet (0.2 mg total) by mouth 2 (two) times daily. Patient not taking: Reported on 09/04/2020 08/22/18   Elgergawy, Leana Roe, MD  ezetimibe (ZETIA) 10 MG tablet Take 1 tablet (10 mg total) by mouth daily. 07/09/19   Runell Gess, MD  FREESTYLE LITE test strip CHECK 3 TIMES PER DAY 05/17/16   Quentin Angst, MD  Insulin Syringe-Needle U-100 (BD INSULIN SYRINGE ULTRAFINE) 31G X 15/64" 0.5 ML MISC Take insulin once nightly as needed 06/10/15   [name], NP  isosorbide mononitrate (IMDUR) 30 MG 24 hr tablet Take 0.5 tablets (15 mg total) by mouth daily. 01/30/18   Runell Gess, MD  ketoconazole (NIZORAL) 2 % cream Apply 1 application topically daily. Apply to both feet and between toes once daily for 6 weeks 09/03/18   Freddie Breech, DPM  Lancets (FREESTYLE) lancets Check 3 times per day for E11.9 01/16/15   [name], NP  loratadine (CLARITIN) 10 MG tablet Take 10 mg by mouth daily.    [provider]  LUMIGAN 0.01 % SOLN Place 1 drop into both eyes at bedtime.  09/10/19   [provider]  nitrofurantoin, macrocrystal-monohydrate, (MACROBID) 100 MG capsule nitrofurantoin monohydrate/macrocrystals 100 mg capsule    [provider]  polyethylene glycol powder (GLYCOLAX/MIRALAX) powder Take 17 g by mouth daily. 01/16/15   [name], NP  SIMBRINZA 1-0.2 % SUSP  02/13/20   [provider]  simvastatin (ZOCOR) 20 MG tablet TAKE 1 TABLET (20 MG TOTAL) BY MOUTH AT BEDTIME. OFFICE VISIT NEEDED 08/03/20   Runell Gess, MD  tamsulosin (FLOMAX) 0.4 MG CAPS capsule tamsulosin 0.4 mg capsule    [provider]  Vitamin D, Ergocalciferol, (DRISDOL) 1.25 MG (50000 UNIT) CAPS capsule Take 50,000 Units by mouth once a week. 11/01/19   [provider]  VOLTAREN 1 % GEL APPLY 4 GRAMS TOPICALLY 2 TIMES DAILY AS NEEDED. 04/25/16   Runell Gess, MD    Inpatient Medications: Scheduled Meds: . aspirin EC  81 mg Oral Daily  .  Chlorhexidine Gluconate Cloth  6 each Topical Daily  . enoxaparin (LOVENOX) injection  60 mg Subcutaneous Q12H  . feeding supplement  237 mL Oral BID BM  . furosemide  80 mg Intravenous BID  . methylPREDNISolone (SOLU-MEDROL) injection  40 mg Intravenous Q12H  . metoprolol tartrate  12.5 mg Oral BID  . pantoprazole  40 mg Oral Daily   Continuous Infusions:  PRN Meds: acetaminophen **OR** acetaminophen, labetalol, nitroGLYCERIN, ondansetron (ZOFRAN) IV, sodium chloride flush  Allergies:    Allergies  Allergen Reactions  . Clonidine Derivatives Other (See Comments)    Pass out; makes her blood pressure too low  . Milk-Related Compounds Other (See Comments)    Lactose intolerant.      Social History:   Social History   Socioeconomic History  . Marital status: Widowed    Spouse name: Not on file  . Number of children: Not on file  . Years of education: Not on file  . Highest education level: Not on file  Occupational History  . Not on file  Tobacco Use  . Smoking status: Former  Smoker    Types: Cigarettes    Quit date: 06/06/2002    Years since quitting: 18.2  . Smokeless tobacco: Never Used  . Tobacco comment: Quit at age 17  Substance and Sexual Activity  . Alcohol use: No    Alcohol/week: 0.0 standard drinks  . Drug use: No  . Sexual activity: Never  Other Topics Concern  . Not on file  Social History Narrative  . Not on file   Social Determinants of Health   Financial Resource Strain: Not on file  Food Insecurity: Not on file  Transportation Needs: Not on file  Physical Activity: Not on file  Stress: Not on file  Social Connections: Not on file  Intimate Partner Violence: Not on file    Family History:   Family History  Problem Relation Age of Onset  . Diabetes Mother   . Stroke Mother   . Peripheral vascular disease Mother        amputation  . Cancer Brother   . Diabetes Brother   . Alcohol abuse Brother   . Varicose Veins Brother   . Hypertension Father   . Varicose Veins Father   . Heart attack Father   . Cancer Father   . Diabetes Sister   . Hypertension Sister    Family Status:  Family Status  Relation Name Status  . Mother  Deceased  . Brother  Deceased  . Father  Deceased  . Sister  (Not Specified)    ROS:  Please see the history of present illness.  All other ROS reviewed and negative.     Physical Exam/Data:   Vitals:   09/08/20 2101 09/09/20 0203 09/09/20 0445 09/09/20 0500  BP: (!) 142/83 (!) 144/80 (!) 143/72   Pulse: 100 82 86   Resp: (!) 21 14 18    Temp: 97.7 F (36.5 C) 98.3 F (36.8 C) 97.7 F (36.5 C)   TempSrc: Oral Oral Oral   SpO2: 99% 97% 99%   Weight:    64.2 kg  Height:    5\' 5"  (1.651 m)    Intake/Output Summary (Last 24 hours) at 09/09/2020 0857 Last data filed at 09/09/2020 0500 Gross per 24 hour  Intake 118 ml  Output 450 ml  Net -332 ml   Filed Weights   08/31/20 1934 09/09/20 0500  Weight: 62.6 kg 64.2 kg   Body mass index is  23.55 kg/m.   Due to COVID-19 infection,  interview performed over the phone, pertinent parts of physical exam obtained from other providers.  EKG:  The EKG was personally reviewed and demonstrates: 09/09/20 NSR with inferior and anterior Q waves with diffuse TW inversions, some of which were present on prior tracings.  Telemetry:  Telemetry was personally reviewed and demonstrates:  09/09/20 NSR  Relevant CV Studies:  Echocardiogram 08/17/2018:  - Left ventricle: The cavity size was normal. Wall thickness was  increased in a pattern of moderate LVH. Systolic function was  normal. The estimated ejection fraction was in the range of 60%  to 65%. Doppler parameters are consistent with abnormal left  ventricular relaxation (grade 1 diastolic dysfunction). The E/e&'  ratio is between 8-15, suggesting indeterminate LV filling  pressure.  - Aortic valve: Mildly calcified leaflets. There was no stenosis.  There was no significant regurgitation. Mean gradient (S): 6 mm  Hg.  - Mitral valve: Mildly thickened leaflets . There was trivial  regurgitation.  - Left atrium: Moderately dilated.  - Inferior vena cava: The vessel was normal in size. The  respirophasic diameter changes were in the normal range (>= 50%),  consistent with normal central venous pressure.   Impressions:   - LVEF 60-65%, moderate LVH, normal wall motion, grade 1 DD,  indeterminate LV filling pressure, trivial MR, moderate LAE,  normal IVC.   Myocardial perfusion study 11/07/2014: Impression Exercise Capacity:  Lexiscan with no exercise. BP Response:  Normal blood pressure response. Clinical Symptoms:  No significant symptoms noted. ECG Impression:  No significant ST segment change suggestive of ischemia. Comparison with Prior Nuclear Study: No previous nuclear study performed  Overall Impression:  Normal stress nuclear study.  LV Wall Motion:  NL LV Function; NL Wall Motion  Laboratory Data:  Chemistry Recent Labs  Lab  09/07/20 0310 09/08/20 0326 09/09/20 0515  NA 139 137 136  K 4.0 4.0 3.5  CL 104 104 100  CO2 24 21* 25  GLUCOSE 151* 175* 215*  BUN 9 8 12   CREATININE 0.54 0.39* 0.58  CALCIUM 7.9* 7.5* 7.5*  GFRNONAA >60 >60 >60  ANIONGAP 11 12 11     Total Protein  Date Value Ref Range Status  09/09/2020 5.1 (L) 6.5 - 8.1 g/dL Final   Albumin  Date Value Ref Range Status  09/09/2020 2.2 (L) 3.5 - 5.0 g/dL Final   AST  Date Value Ref Range Status  09/09/2020 25 15 - 41 U/L Final   ALT  Date Value Ref Range Status  09/09/2020 25 0 - 44 U/L Final   Alkaline Phosphatase  Date Value Ref Range Status  09/09/2020 43 38 - 126 U/L Final   Total Bilirubin  Date Value Ref Range Status  09/09/2020 0.9 0.3 - 1.2 mg/dL Final   Hematology Recent Labs  Lab 09/05/20 0355 09/07/20 0310 09/09/20 0515  WBC 8.5 10.8* 9.3  RBC 4.02 4.95 4.36  HGB 12.6 15.4* 13.5  HCT 38.7 47.7* 41.0  MCV 96.3 96.4 94.0  MCH 31.3 31.1 31.0  MCHC 32.6 32.3 32.9  RDW 12.9 13.0 13.2  PLT 207 242 250   Cardiac EnzymesNo results for input(s): TROPONINI in the last 168 hours. No results for input(s): TROPIPOC in the last 168 hours.  BNP Recent Labs  Lab 09/08/20 2125  BNP 1,007.4*    DDimer  Recent Labs  Lab 09/06/20 1858 09/07/20 0913 09/08/20 2125  DDIMER >20.00* 1.02* 1.04*   TSH: No results found for: TSH  Lipids: Lab Results  Component Value Date   CHOL 184 08/25/2014   HDL 62 08/25/2014   LDLCALC 106 (H) 08/25/2014   TRIG 78 08/25/2014   CHOLHDL 3.0 08/25/2014   HgbA1c: Lab Results  Component Value Date   HGBA1C 4.8 09/01/2020    Radiology/Studies:  DG CHEST PORT 1 VIEW  Result Date: 09/08/2020 CLINICAL DATA:  Worsening shortness of breath EXAM: PORTABLE CHEST 1 VIEW COMPARISON:  09/08/2020 at 7:32 a.m. FINDINGS: Single frontal view of the chest demonstrates stable enlargement the cardiac silhouette. Continued ectasia of the thoracic aorta and atherosclerosis unchanged. Bibasilar  veiling opacities are again noted, with stable interstitial and ground-glass opacities. Overall, findings favor congestive heart failure. No pneumothorax. No acute bony abnormalities. IMPRESSION: 1. Stable findings of congestive heart failure. Electronically Signed   By: Sharlet Salina M.D.   On: 09/08/2020 15:07   DG CHEST PORT 1 VIEW  Result Date: 09/08/2020 CLINICAL DATA:  Acute hypoxic respiratory failure due to COVID EXAM: PORTABLE CHEST 1 VIEW COMPARISON:  Two days ago FINDINGS: Cardiomegaly and vascular pedicle widening accentuated by rightward rotation. Hazy appearance of the bilateral chest which appears similar to before. No visible effusion or pneumothorax. The right-sided PICC is no longer seen. IMPRESSION: Stable bilateral pulmonary infiltrate Electronically Signed   By: Marnee Spring M.D.   On: 09/08/2020 07:56   DG CHEST PORT 1 VIEW  Result Date: 09/06/2020 CLINICAL DATA:  PICC line placement. EXAM: PORTABLE CHEST 1 VIEW COMPARISON:  Chest radiograph 08/31/2020 FINDINGS: Interval insertion right upper extremity PICC line. The tip appears malpositioned as it appears to course leftward likely within the central left brachiocephalic vein, recommend retraction and repeat radiograph. Stable enlarged cardiac and mediastinal contours. Aortic atherosclerosis. Low lung volumes. Increased bilateral interstitial pulmonary opacities. Probable small bilateral pleural effusions. Thoracic spine degenerative changes. IMPRESSION: 1. Interval insertion right upper extremity PICC line. The tip appears malpositioned as it appears to course leftward likely within the central left brachiocephalic vein, recommend retraction and repeat radiograph. 2. Increasing bilateral interstitial opacities which may represent edema. 3. These results will be called to the ordering clinician or representative by the Radiologist Assistant, and communication documented in the PACS or [employer]. Electronically Signed   By:  Annia Belt M.D.   On: 09/06/2020 09:49   [place] EKG SITE RITE  Result Date: 09/08/2020 If Site Rite image not attached, placement could not be confirmed due to current cardiac rhythm.  Assessment and Plan:   1.  Elevated troponin with hypoxic respiratory failure: -Patient presented with febrile illness found to be Covid positive on ED presentation.  She was initially stable however acutely worsened on 09/08/2020 at which time to have some aspect of flash pulmonary edema therefore she was treated with IV Lasix.  Troponin level was found to be elevated at 96 with a repeat at 1188.  BNP elevated at 1007.  She has no prior history of CAD however does have peripheral vascular disease with occlusive bilateral carotid artery disease along with occlusive peripheral vascular disease followed by Dr. Allyson Sabal.  On chest imaging, there are coronary calcifications.  Per chart review, although asymptomatic, EKG suggestive of prior MI -Given acute illness with decompensation along with advanced age, dementia, and complex comorbid issues, not felt to be an invasive cardiac candidate.  Would plan to manage medically at this time.  Once she is over her acute illness, would suggest possible Lexiscan stress testing for further evaluation. -Echocardiogram currently pending -May suggest repeating chest CT imaging  due to the above penetrating thoracic aortic ulcer with small dissection flap followed by VVS to ensure stability -Maintain adequate BPs -Continue IV heparin, Lopressor 12.5 -Continue IV Lasix 80 mg twice daily and follow renal function closely -Stable creatinine at 0.58 -Net -331 mL  2.  Penetrating thoracic aortic ulcer with small dissection flap: -Followed by Dr. Randie Heinz with VVS who was previously felt that surgical intervention was not required given her age and lack of symptoms. -Suggest chest CTA for further assessment.  Appears unchanged from 11/14/2018 with  3.  History of peripheral arterial  disease: -Lower extremity Doppler study in 2015 showed abnormal ABIs with high-grade bilateral SFA disease and severe tibial disease without claudication -Continue current regimen  4.  HLD: -Last LDL, 95 on 05/15/2019 -Continue Zetia  5.  HTN: -Elevated, 143/72, 144/80, 142/83 -On PTA amlodipine, clonidine and hydralazine -Early being treated with metoprolol tartrate 12.5 mg -We will start losartan 25 mg p.o. daily and consider restarting PTA hydralazine 25 mg twice daily at a later time    For questions or updates, please contact [facility] Please consult [email or web address] for contact info under Cardiology/STEMI.   SignedGeorgie Chard NP-C [facility] Pager: [phone] 09/09/2020 8:57 AM  The patient was seen, examined and discussed with Georgie Chard, NP  and I agree with the above.   86 y.o.femalewith a hx ofdementia, HLD, DM2, hypertension,peripheral vascular disease,prior CVA, patient reported occlusive right carotid disease, remote tobacco use, peripheral neuropathy, legal blindness and known stable penetrating aortic ulceration with possible dissection flap of the distal thoracic aorta followed byVVS, who presented with generalized weakness and was found to be Covid positive she did not have any respiratory symptoms on presentation but later in the hospital she became short of breath chest x-ray showed flash pulmonary edema. High-sensitivity troponin loss 96 -> 1188->2651.EKG was stable with no acute ST changes. She was given ASA and SL NTG along with therapeutic Lovenox. PICC line was placed for IV access for IV heparin.  She is followed by Dr. Pearletha Forge significant PAD with abnormal Doppler studies performed in 2015 with high-grade bilateral SFA disease and severe tibial vessel disease. She hasremained chronically stable for this. She also has a history of CVA with occlusive right and left carotid arterial disease whohas refused further studies. Per Dr. Hazle Coca  report and EKG tracing, she apparently had an out of hospital MI in 2014 and there is evidence of coronary calcifications on chest CTA. She underwent a nuclear stress test 11/07/2014 which showed no evidence of ischemia or scarring. Echocardiogram from 08/17/2018 with an LVEF at 60 to 65% with G1 DD, LVH and no valvular disease.  She is also followed by vascular service,Dr.Cainfor a known penetrating aortic ulceration with possible dissection flap of the distal thoracic aorta diagnosed 10/31/2017 not felt to be a surgical candidate.   Her EKGs show sinus tachycardia with negative T waves in the inferolateral leads unchanged from prior EKGs. Her electrolytes are normal creatinine is 0.39, CRP 7.1, BNP 1007, hemoglobin 13.5. Chest x-ray shows flash pulmonary edema bilaterally  Assessment and plan  Acute diastolic CHF in the settings of acute respiratory failure with Covid pneumonia Non-STEMI History of penetrating ulcer in the thoracic aorta Peripheral arterial disease History of CVA Dementia  I would continue Lasix 40 mg IV twice daily, monitor I's and O's closely as well as weights, her creatinine is normal. Continue IV heparin for 48 hours after her heparin starts downtrending she is not a candidate for any invasive strategies  given her underlying dementia, penetrating ulcer, ongoing Covid infection and lack of symptoms. We will obtain an echocardiogram to evaluate for LVEF and wall motion abnormalities.  Continue aspirin, and increase metoprolol to 25 mg Toprol-XL daily.  Tobias Alexander, MD 09/09/2020

## 2020-09-09 NOTE — Plan of Care (Signed)
  Problem: Clinical Measurements: Goal: Ability to maintain clinical measurements within normal limits will improve Outcome: Progressing   Problem: Clinical Measurements: Goal: Diagnostic test results will improve Outcome: Progressing   Problem: Clinical Measurements: Goal: Respiratory complications will improve Outcome: Progressing   Problem: Clinical Measurements: Goal: Cardiovascular complication will be avoided Outcome: Progressing   Problem: Nutrition: Goal: Adequate nutrition will be maintained Outcome: Progressing   Problem: Pain Managment: Goal: General experience of comfort will improve Outcome: Progressing   Problem: Respiratory: Goal: Complications related to the disease process, condition or treatment will be avoided or minimized Outcome: Progressing

## 2020-09-09 NOTE — Consult Note (Signed)
86 y.o. female with a hx of dementia, HLD, DM2, hypertension, peripheral vascular disease, prior CVA, patient reported occlusive right carotid disease, remote tobacco use, peripheral neuropathy, legal blindness and known stable penetrating aortic ulceration with possible dissection flap of the distal thoracic aorta followed by [facility], who presented with generalized weakness and was found to be Covid positive she did not have any respiratory symptoms on presentation but later in the hospital she became short of breath chest x-ray showed flash pulmonary edema. High-sensitivity troponin loss 96 -> 1188->2651.  EKG was stable with no acute ST changes.  She was given ASA and SL NTG along with therapeutic Lovenox.  PICC line was placed for IV access for IV heparin.  She is followed by Dr. Allyson Sabal for significant PAD with abnormal Doppler studies performed in 2015 with high-grade bilateral SFA disease and severe tibial vessel disease.  She has remained chronically stable for this.  She also has a history of CVA with occlusive right and left carotid arterial disease who has refused further studies.  Per Dr. Hazle Coca report and EKG tracing, she apparently had an out of hospital MI in 2014 and there is evidence of coronary calcifications on chest CTA.  She underwent a nuclear stress test 11/07/2014 which showed no evidence of ischemia or scarring.  Echocardiogram from 08/17/2018 with an LVEF at 60 to 65% with G1 DD, LVH and no valvular disease.  She is also followed by vascular service, Dr. Randie Heinz for a known penetrating aortic ulceration with possible dissection flap of the distal thoracic aorta diagnosed 10/31/2017 not felt to be a surgical candidate.    Her EKGs show sinus tachycardia with negative T waves in the inferolateral leads unchanged from prior EKGs. Her electrolytes are normal creatinine is 0.39, CRP 7.1, BNP 1007, hemoglobin 13.5. Chest x-ray shows flash pulmonary edema bilaterally  Assessment and  plan  Acute diastolic CHF in the settings of acute respiratory failure with Covid pneumonia Non-STEMI History of penetrating ulcer in the thoracic aorta Peripheral arterial disease History of CVA Dementia  I would continue Lasix 40 mg IV twice daily, monitor I's and O's closely as well as weights, her creatinine is normal. Continue IV heparin for 48 hours after her heparin starts downtrending she is not a candidate for any invasive strategies given her underlying dementia, penetrating ulcer, ongoing Covid infection and lack of symptoms. We will obtain an echocardiogram to evaluate for LVEF and wall motion abnormalities.  Continue aspirin, and increase metoprolol to 25 mg Toprol-XL daily.  Tobias Alexander, MD 09/09/2020

## 2020-09-09 NOTE — TOC Progression Note (Signed)
Transition of Care [facility]) - Progression Note    Patient Details  Name: Rian Koon MRN: [id number] Date of Birth: 04-21-1930  [facility] CM/SW Contact  Ida Rogue, [place] Phone Number: 09/09/2020, 8:20 AM  Clinical Narrative:  Spoke with Cindie with [facility] who agreed to pick up patient for [facility] services at d/c. [facility] will continue to follow during the course of hospitalization.      Expected Discharge Plan: Home w Home Health Services Barriers to Discharge: Barriers Resolved  Expected Discharge Plan and Services Expected Discharge Plan: Home w Home Health Services                                               Social Determinants of Health (SDOH) Interventions    Readmission Risk Interventions No flowsheet data found.

## 2020-09-09 NOTE — Consult Note (Signed)
Consultation Note Date: 09/09/2020   Patient Name: Kaitlin Smith  DOB: 02/11/1930  MRN: [id number]  Age / Sex: 86 y.o., female  PCP: Alysia Penna, MD Referring Physician: Tyrone Nine, MD  Reason for Consultation: Establishing goals of care  HPI/Patient Profile: 86 y.o. female   admitted on 08/31/2020    Clinical Assessment and Goals of Care: 86 year old lady with dementia diabetes hypertension CVA PVD and AAA.  Lives at home with niece.  Admitted with generalized weakness.  Found to have urinary tract infection.  Also found to have SARS-CoV-2 PCR positive.  Chest x-ray without infiltrates patient without respiratory symptoms found to have acute DVT right lower extremity.  Also experienced abrupt onset of chest pain and dyspnea with worsening hypoxia due to flash pulmonary edema and non-ST segment elevation MI in this hospitalization.  Remains admitted to hospital medicine service, cardiology consultants also following.  Palliative medicine consultation for ongoing goals of care discussions has been requested.  Patient currently under sterile procedure.  Also underwent 2D echocardiogram earlier.  Not appear to be in distress. Call placed and discussed with niece Kaitlin Smith.  I introduced myself and palliative care as follows: Palliative medicine is specialized medical care for people living with serious illness. It focuses on providing relief from the symptoms and stress of a serious illness. The goal is to improve quality of life for both the patient and the family.  Goals of care: Broad aims of medical therapy in relation to the patient's values and preferences. Our aim is to provide medical care aimed at enabling patients to achieve the goals that matter most to them, given the circumstances of their particular medical situation and their constraints.   We discussed about the patient's current scope of  hospitalization.  Cardiac work-up underway discussed.  Discussed about patient's current blood work in some detail.  [name] states that she also had Covid but she is recuperating at home.  Family's goals are to continue current mode of care and they remain hopeful for some degree of stabilization/recovery for the patient.  Attempted to establish rapport and trust building at the time of this initial consultation, palliative team to follow-up hospital course and to continue to address goals of care.  NEXT OF KIN Niece Kaitlin Smith.  SUMMARY OF RECOMMENDATIONS   Agree with DNR Monitor hospital course, overall disease trajectory.  Patient undergoing cardiac work-up.  Patient with high risk of ongoing decline/decompensation.  Extensive goals of care discussions with patient's niece Kaitlin Smith at [phone].  Prognosis and appropriate disposition options discussed in detail.  Discussed about patient's disease burden and symptom burden in light of current illnesses.  [name] states that she wants the patient to come home and that she is taking it "day by day." Monitor hospital course and overall disease trajectory for further decision making and medical recommendations.  Code Status/Advance Care Planning:  DNR    Symptom Management:   As above  Palliative Prophylaxis:   Delirium Protocol   Psycho-social/Spiritual:   Desire for  further Chaplaincy support:yes  Additional Recommendations: Caregiving  Support/Resources  Prognosis:   Unable to determine  Discharge Planning: To Be Determined      Primary Diagnoses: Present on Admission: . COVID-19 virus infection . Essential hypertension . MCI (mild cognitive impairment) . Aortic dissection (HCC) . Controlled type 2 diabetes mellitus with hyperglycemia (HCC) . COVID . Acute lower UTI . Acute hypoxemic respiratory failure due to COVID-19 [facility])   I have reviewed the medical record, interviewed the patient and  family, and examined the patient. The following aspects are pertinent.  Past Medical History:  Diagnosis Date  . Allergy   . Arthritis   . Blindness   . Coronary artery disease   . Diabetes mellitus without complication (HCC)   . Glaucoma   . Hyperlipidemia   . Hypertension   . Myocardial infarction (HCC)   . Peripheral vascular disease (HCC)   . Stroke [facility])    Social History   Socioeconomic History  . Marital status: Widowed    Spouse name: Not on file  . Number of children: Not on file  . Years of education: Not on file  . Highest education level: Not on file  Occupational History  . Not on file  Tobacco Use  . Smoking status: Former Smoker    Types: Cigarettes    Quit date: 06/06/2002    Years since quitting: 18.2  . Smokeless tobacco: Never Used  . Tobacco comment: Quit at age 59  Substance and Sexual Activity  . Alcohol use: No    Alcohol/week: 0.0 standard drinks  . Drug use: No  . Sexual activity: Never  Other Topics Concern  . Not on file  Social History Narrative  . Not on file   Social Determinants of Health   Financial Resource Strain: Not on file  Food Insecurity: Not on file  Transportation Needs: Not on file  Physical Activity: Not on file  Stress: Not on file  Social Connections: Not on file   Family History  Problem Relation Age of Onset  . Diabetes Mother   . Stroke Mother   . Peripheral vascular disease Mother        amputation  . Cancer Brother   . Diabetes Brother   . Alcohol abuse Brother   . Varicose Veins Brother   . Hypertension Father   . Varicose Veins Father   . Heart attack Father   . Cancer Father   . Diabetes Sister   . Hypertension Sister    Scheduled Meds: . aspirin EC  81 mg Oral Daily  . Chlorhexidine Gluconate Cloth  6 each Topical Daily  . enoxaparin (LOVENOX) injection  60 mg Subcutaneous Q12H  . feeding supplement  237 mL Oral BID BM  . furosemide  80 mg Intravenous BID  . methylPREDNISolone (SOLU-MEDROL)  injection  40 mg Intravenous Q12H  . metoprolol tartrate  12.5 mg Oral BID  . pantoprazole  40 mg Oral Daily   Continuous Infusions: PRN Meds:.acetaminophen **OR** acetaminophen, labetalol, nitroGLYCERIN, ondansetron (ZOFRAN) IV, sodium chloride flush Medications Prior to Admission:  Prior to Admission medications   Medication Sig Start Date End Date Taking? Authorizing Provider  acetaminophen (TYLENOL) 325 MG tablet Take 2 tablets (650 mg total) by mouth every 6 (six) hours as needed for mild pain (temp > 101.5). 08/22/18  Yes Elgergawy, Leana Roe, MD  amLODipine (NORVASC) 10 MG tablet Take 1 tablet (10 mg total) by mouth daily. 07/23/19  Yes Runell Gess, MD  clopidogrel (PLAVIX)  75 MG tablet TAKE 1 TABLET (75 MG TOTAL) BY MOUTH DAILY. 10/21/19  Yes Runell Gess, MD  gabapentin (NEURONTIN) 300 MG capsule Take 300 mg by mouth daily. 11/19/19  Yes [provider]  hydrALAZINE (APRESOLINE) 25 MG tablet Take 1 tablet (25 mg total) by mouth 2 (two) times daily. 08/22/18  Yes Elgergawy, Leana Roe, MD  insulin glargine (LANTUS) 100 UNIT/ML injection Inject 0-0.1 mLs (0-10 Units total) into the skin at bedtime. Sliding scale Patient taking differently: Inject 0-10 Units into the skin See admin instructions. Per Sliding scale 11/12/15  Yes Holland Commons A, NP  insulin lispro (HUMALOG) 100 UNIT/ML injection INJECT 0.1 MILLILITERS (10 UNITS) INTO THE SKIN 3 TIMES A DAY WITH MEALS Patient taking differently: Inject 0-10 Units into the skin See admin instructions. Per Sliding Scale 3 TIMES A DAY WITH MEALS 10/29/16  Yes Weber, Sarah L, PA-C  pantoprazole (PROTONIX) 40 MG tablet TAKE 1 TABLET BY MOUTH EVERY DAY 11/15/19  Yes Runell Gess, MD  calcium carbonate (OSCAL) 1500 (600 Ca) MG TABS tablet Take 1,500 mg by mouth daily with breakfast.    [provider]  cloNIDine (CATAPRES) 0.2 MG tablet Take 1 tablet (0.2 mg total) by mouth 2 (two) times daily. Patient not taking: Reported on  09/04/2020 08/22/18   Elgergawy, Leana Roe, MD  ezetimibe (ZETIA) 10 MG tablet Take 1 tablet (10 mg total) by mouth daily. 07/09/19   Runell Gess, MD  FREESTYLE LITE test strip CHECK 3 TIMES PER DAY 05/17/16   Quentin Angst, MD  Insulin Syringe-Needle U-100 (BD INSULIN SYRINGE ULTRAFINE) 31G X 15/64" 0.5 ML MISC Take insulin once nightly as needed 06/10/15   [name], NP  isosorbide mononitrate (IMDUR) 30 MG 24 hr tablet Take 0.5 tablets (15 mg total) by mouth daily. 01/30/18   Runell Gess, MD  ketoconazole (NIZORAL) 2 % cream Apply 1 application topically daily. Apply to both feet and between toes once daily for 6 weeks 09/03/18   Freddie Breech, DPM  Lancets (FREESTYLE) lancets Check 3 times per day for E11.9 01/16/15   [name], NP  loratadine (CLARITIN) 10 MG tablet Take 10 mg by mouth daily.    [provider]  LUMIGAN 0.01 % SOLN Place 1 drop into both eyes at bedtime. 09/10/19   [provider]  nitrofurantoin, macrocrystal-monohydrate, (MACROBID) 100 MG capsule nitrofurantoin monohydrate/macrocrystals 100 mg capsule    [provider]  polyethylene glycol powder (GLYCOLAX/MIRALAX) powder Take 17 g by mouth daily. 01/16/15   [name], NP  SIMBRINZA 1-0.2 % SUSP  02/13/20   [provider]  simvastatin (ZOCOR) 20 MG tablet TAKE 1 TABLET (20 MG TOTAL) BY MOUTH AT BEDTIME. OFFICE VISIT NEEDED 08/03/20   Runell Gess, MD  tamsulosin (FLOMAX) 0.4 MG CAPS capsule tamsulosin 0.4 mg capsule    [provider]  Vitamin D, Ergocalciferol, (DRISDOL) 1.25 MG (50000 UNIT) CAPS capsule Take 50,000 Units by mouth once a week. 11/01/19   [provider]  VOLTAREN 1 % GEL APPLY 4 GRAMS TOPICALLY 2 TIMES DAILY AS NEEDED. 04/25/16   Runell Gess, MD   Allergies  Allergen Reactions  . Clonidine Derivatives Other (See Comments)    Pass out; makes her blood pressure too low  . Milk-Related Compounds Other (See  Comments)    Lactose intolerant.     Review of Systems Currently undergoing procedure Physical Exam Currently undergoing procedure, sterile procedure going on.  Observed from outside the room, appears to be an elderly lady resting in bed, does not appear to have nonverbal gestures of distress or discomfort, appears to have regular work of breathing.  Full examination not performed due to patient in sterile procedure.  Vital Signs: BP 119/71 (BP Location: Left Arm)   Pulse 78   Temp 98.5 F (36.9 C) (Oral)   Resp 19   Ht 5\' 5"  (1.651 m)   Wt 64.2 kg   SpO2 93%   BMI 23.55 kg/m  Pain Scale: 0-10 POSS *See Group Information*: S-Acceptable,Sleep, easy to arouse Pain Score: Asleep   SpO2: SpO2: 93 % O2 Device:SpO2: 93 % O2 Flow Rate: .O2 Flow Rate (L/min): 8 L/min  IO: Intake/output summary:   Intake/Output Summary (Last 24 hours) at 09/09/2020 1546 Last data filed at 09/09/2020 1308 Gross per 24 hour  Intake 178 ml  Output 1250 ml  Net -1072 ml    LBM: Last BM Date: 09/08/20 Baseline Weight: Weight: 62.6 kg Most recent weight: Weight: 64.2 kg     Palliative Assessment/Data:   Palliative performance scale 30%  Time In: 1430 Time Out: 1530 Time Total: 60 Greater than 50%  of this time was spent counseling and coordinating care related to the above assessment and plan.  Signed by: Rosalin HawkingZeba Aima Mcwhirt, MD   Please contact Palliative Medicine Team phone at [phone] for questions and concerns.  For individual provider: See Loretha StaplerAmion

## 2020-09-10 LAB — BASIC METABOLIC PANEL
Anion gap: 10 (ref 5–15)
BUN: 18 mg/dL (ref 8–23)
CO2: 31 mmol/L (ref 22–32)
Calcium: 7.4 mg/dL — ABNORMAL LOW (ref 8.9–10.3)
Chloride: 97 mmol/L — ABNORMAL LOW (ref 98–111)
Creatinine, Ser: 0.69 mg/dL (ref 0.44–1.00)
GFR, Estimated: >60 mL/min (ref >60)
Glucose, Bld: 168 mg/dL — ABNORMAL HIGH (ref 70–99)
Potassium: 3.4 mmol/L — ABNORMAL LOW (ref 3.5–5.1)
Sodium: 138 mmol/L (ref 135–145)

## 2020-09-10 LAB — CBC
HCT: 36.8 % (ref 36.0–46.0)
Hemoglobin: 12.2 g/dL (ref 12.0–15.0)
MCH: 31.9 pg (ref 26.0–34.0)
MCHC: 33.2 g/dL (ref 30.0–36.0)
MCV: 96.1 fL (ref 80.0–100.0)
Platelets: 248 10*3/uL (ref 150–400)
RBC: 3.83 MIL/uL — ABNORMAL LOW (ref 3.87–5.11)
RDW: 13.3 % (ref 11.5–15.5)
WBC: 8 10*3/uL (ref 4.0–10.5)
nRBC: 0.3 % — ABNORMAL HIGH (ref 0.0–0.2)

## 2020-09-10 LAB — C-REACTIVE PROTEIN: CRP: 2.4 mg/dL — ABNORMAL HIGH (ref <1.0)

## 2020-09-10 MED ORDER — LOSARTAN POTASSIUM 25 MG PO TABS
25.0000 mg | ORAL_TABLET | Freq: Every day | ORAL | Status: DC
  Administered 2020-09-10 – 2020-09-11 (×2): 25 mg via ORAL
  Filled 2020-09-10 (×2): qty 1

## 2020-09-10 MED ORDER — CARVEDILOL 3.125 MG PO TABS
3.1250 mg | ORAL_TABLET | Freq: Two times a day (BID) | ORAL | Status: DC
  Administered 2020-09-10 (×2): 3.125 mg via ORAL
  Filled 2020-09-10 (×2): qty 1

## 2020-09-10 MED ORDER — POTASSIUM CHLORIDE CRYS ER 20 MEQ PO TBCR
40.0000 meq | EXTENDED_RELEASE_TABLET | Freq: Every day | ORAL | Status: DC
  Administered 2020-09-10: 40 meq via ORAL
  Filled 2020-09-10 (×2): qty 2

## 2020-09-10 NOTE — Progress Notes (Signed)
Occupational Therapy Treatment Patient Details Name: Kaitlin Smith MRN: [id number] DOB: 1930-06-23 Today's Date: 09/10/2020    History of present illness Patient is an 86 year old female hx of dementia, T2DM, HTN, CVA, PVD, AAA who presented to the ED 12/13 with generalized weakness. Patient positive for COVID   OT comments  Patient continues to require max encouragement and cues to participate with therapy, max A x2 to sit upright at EOB with posterior bias "I want to lay down, get off of me." O2 from finger probe reading in low 70s at EOB however from ear reading high 80s to low 90s on 6L HFNC with RN made aware. Patient tolerate sitting EOB ~6 mins before returned to semi-supine with persistent posterior lean.   Follow Up Recommendations  SNF    Equipment Recommendations  Other (comment) (unsure home DME)       Precautions / Restrictions Precautions Precautions: Fall Precaution Comments: currently on 6L, patient is blind       Mobility Bed Mobility Overal bed mobility: Needs Assistance Bed Mobility: Supine to Sit;Sit to Supine     Supine to sit: Max assist;HOB elevated;+2 for safety/equipment;+2 for physical assistance Sit to supine: Mod assist;+2 for physical assistance;+2 for safety/equipment;HOB elevated   General bed mobility comments: multimodal cues for encouragement however patient does not initiate sitting upright to EOB, utilized bed pad. patient with posterior lean in sitting despite cues/encouragement to sit upright for breakfast after ~6 mins sitting pt returned to bed with mod x2  Transfers                 General transfer comment: deferred 2* safety    Balance Overall balance assessment: Needs assistance Sitting-balance support: Feet supported;Single extremity supported Sitting balance-Leahy Scale: Poor Sitting balance - Comments: leans back, with cues will sit more upright for brief periods                                   ADL  either performed or assessed with clinical judgement   ADL Overall ADL's : Needs assistance/impaired Eating/Feeding: Total assistance Eating/Feeding Details (indicate cue type and reason): patient is blind requiring total A to eat, attempted to engage patient in task seated EOB however patient with posterior lean and when try to provide support patient states "get off of me!"                                                   Cognition Arousal/Alertness: Awake/alert Behavior During Therapy: Anxious;Restless Overall Cognitive Status: No family/caregiver present to determine baseline cognitive functioning Area of Impairment: Following commands                       Following Commands: Follows one step commands inconsistently       General Comments: perseverates on wanting to go home and to leave her alone              General Comments patient's finger probe reading in low 70s with seated EOB however with pulse ox on ear patient reading high 80s-low 90s on 6L HFNC. notified RN that ear providing markedly different readings vs finger    Pertinent Vitals/ Pain       Pain Assessment: Faces Faces Pain Scale: No hurt  Frequency  Min 2X/week        Progress Toward Goals  OT Goals(current goals can now be found in the care plan section)  Progress towards OT goals: Not progressing toward goals - comment (cognition)  Acute Rehab OT Goals Patient Stated Goal: I am sick OT Goal Formulation: With patient Time For Goal Achievement: 09/16/20 Potential to Achieve Goals: Fair ADL Goals Pt Will Perform Upper Body Bathing: with min guard assist;sitting Pt Will Transfer to Toilet: with min assist;bedside commode;stand pivot transfer Pt Will Perform Toileting - Clothing Manipulation and hygiene: with mod assist;sitting/lateral leans;sit to/from stand  Plan Discharge plan remains appropriate    Co-evaluation    PT/OT/SLP Co-Evaluation/Treatment:  Yes Reason for Co-Treatment: Necessary to address cognition/behavior during functional activity;For patient/therapist safety   OT goals addressed during session: ADL's and self-care      AM-PAC OT "6 Clicks" Daily Activity     Outcome Measure   Help from another person eating meals?: Total Help from another person taking care of personal grooming?: Total Help from another person toileting, which includes using toliet, bedpan, or urinal?: Total Help from another person bathing (including washing, rinsing, drying)?: Total Help from another person to put on and taking off regular upper body clothing?: Total Help from another person to put on and taking off regular lower body clothing?: Total 6 Click Score: 6    End of Session Equipment Utilized During Treatment: Oxygen  OT Visit Diagnosis: Unsteadiness on feet (R26.81);Other abnormalities of gait and mobility (R26.89);Muscle weakness (generalized) (M62.81);Other symptoms and signs involving cognitive function   Activity Tolerance Patient limited by fatigue;Other (comment) (cognition)   Patient Left in bed;with call bell/phone within reach;with bed alarm set   Nurse Communication Mobility status;Other (comment) (O2/check ear)        Time: 6160-7371 OT Time Calculation (min): 23 min  Charges: OT General Charges $OT Visit: 1 Visit OT Treatments $Self Care/Home Management : 8-22 mins  Marlyce Huge OT OT pager: [phone]  Carmelia Roller 09/10/2020, 10:40 AM

## 2020-09-10 NOTE — Care Management Important Message (Signed)
Important Message  Patient Details IM Letter given to the Patient. Name: Kaitlin Smith MRN: [id number] Date of Birth: December 18, 1929   Medicare Important Message Given:  Yes     Caren Macadam 09/10/2020, 12:23 PM

## 2020-09-10 NOTE — Progress Notes (Signed)
Physical Therapy Treatment Patient Details Name: Kaitlin Smith MRN: [id number] DOB: 05-14-1930 Today's Date: 09/10/2020    History of Present Illness Patient is an 86 year old female hx of dementia, T2DM, HTN, CVA, PVD, AAA who presented to the ED 12/13 with generalized weakness. Patient positive for COVID    PT Comments    The patient did participate in sitting on bed edge and partook of some breakfast. Patient noted coughing frequently. Patient's SPO2 on finger reading mid 80's. Placed probe on ear with SPO2 955. RN aware. Patient  intermittently asking for therapists to leave. Patient sat on bed edge for ~ 8 minutes, frequently leaning back. Multimodal cues to reposition self. Continue PT.  Follow Up Recommendations  SNF     Equipment Recommendations  None recommended by PT    Recommendations for Other Services       Precautions / Restrictions Precautions Precautions: Fall Precaution Comments: currently on 6L, patient is blind    Mobility  Bed Mobility Overal bed mobility: Needs Assistance Bed Mobility: Supine to Sit;Sit to Supine     Supine to sit: Max assist;HOB elevated;+2 for safety/equipment;+2 for physical assistance Sit to supine: Mod assist;+2 for physical assistance;+2 for safety/equipment;HOB elevated   General bed mobility comments: multimodal cues for encouragement however patient does not initiate sitting upright to EOB, utilized bed pad. patient with posterior lean in sitting despite cues/encouragement to sit upright for breakfast after ~8 mins sitting pt returned to bed with mod x2  Transfers                    Ambulation/Gait                 Stairs             Wheelchair Mobility    Modified Rankin (Stroke Patients Only)       Balance Overall balance assessment: Needs assistance Sitting-balance support: Feet supported;Single extremity supported Sitting balance-Leahy Scale: Poor Sitting balance - Comments: leans back,  with cues will sit more upright for brief periods                                    Cognition Arousal/Alertness: Awake/alert Behavior During Therapy: Anxious;Restless Overall Cognitive Status: No family/caregiver present to determine baseline cognitive functioning Area of Impairment: Following commands                 Orientation Level: Disoriented to;Time;Situation     Following Commands: Follows one step commands inconsistently       General Comments: perseverates on wanting to go home and to leave her alone      Exercises      General Comments        Pertinent Vitals/Pain Faces Pain Scale: No hurt    Home Living                      Prior Function            PT Goals (current goals can now be found in the care plan section) Progress towards PT goals: Progressing toward goals    Frequency    Min 2X/week      PT Plan Current plan remains appropriate    Co-evaluation PT/OT/SLP Co-Evaluation/Treatment: Yes Reason for Co-Treatment: For patient/therapist safety;Complexity of the patient's impairments (multi-system involvement) PT goals addressed during session: Mobility/safety with mobility OT goals addressed during session: ADL's  and self-care      AM-PAC PT "6 Clicks" Mobility   Outcome Measure  Help needed turning from your back to your side while in a flat bed without using bedrails?: A Lot Help needed moving from lying on your back to sitting on the side of a flat bed without using bedrails?: A Lot Help needed moving to and from a bed to a chair (including a wheelchair)?: A Lot   Help needed to walk in hospital room?: Total Help needed climbing 3-5 steps with a railing? : Total 6 Click Score: 8    End of Session   Activity Tolerance: Patient tolerated treatment well Patient left: in bed;with call bell/phone within reach;with bed alarm set Nurse Communication: Mobility status;Need for lift equipment PT Visit  Diagnosis: Unsteadiness on feet (R26.81);Difficulty in walking, not elsewhere classified (R26.2)     Time: 0109-3235 PT Time Calculation (min) (ACUTE ONLY): 30 min  Charges:  $Therapeutic Activity: 8-22 mins                     Blanchard Kelch PT Acute Rehabilitation Services Pager [phone] Office [phone]    Rada Hay 09/10/2020, 4:31 PM

## 2020-09-10 NOTE — Progress Notes (Addendum)
PROGRESS NOTE  Kaitlin Smith  VHQ:[id number] DOB: 07-Jul-1930 DOA: 08/31/2020 PCP: Alysia PennaHolwerda, Scott, MD   Brief Narrative: Kaitlin Smith is an 86 y.o. female with a history of dementia, T2DM, HTN, CVA, PVD, AAA who presented to the ED 12/13 with generalized weakness. In the ED she was febrile with elevated lactic acid improved with fluids. CT head nonacute. Urinalysis suggested UTI for which ceftriaxone was given. SARS-CoV-2 PCR was positive, though CXR was without infiltrates and patient without respiratory symptoms/signs or hypoxia. She was admitted, found to have acute DVT of the right popliteal vein for which anticoagulation was started. On 12/21 the patient experienced abrupt onset chest pain and dyspnea with worsening hypoxia due to flash pulmonary edema due to NSTEMI/takotsubo cardiomyopathy. Cardiology is consulted and medical management is ongoing.  Assessment & Plan: Principal Problem:   COVID-19 virus infection Active Problems:   Essential hypertension   MCI (mild cognitive impairment)   Aortic dissection (HCC)   Controlled type 2 diabetes mellitus with hyperglycemia (HCC)   COVID   Acute lower UTI   Acute hypoxemic respiratory failure due to COVID-19 [facility](HCC)   Non-ST elevation (NSTEMI) myocardial infarction [facility](HCC)   Acute respiratory failure with hypoxia (HCC)   Palliative care by specialist   Goals of care, counseling/discussion   General weakness  Acute hypoxemic respiratory failure due to covid-19 pneumonia and then acute pulmonary edema: SARS-CoV-2 PCR positive on 12/13.  - Completed remdesivir (12/15 - 12/19) - Continue steroids (started 12/15), plan to taper once CRP more normalized - No indication for baricitinib - Encourage OOB, IS, FV, and awake proning if able - Continue airborne, contact precautions for 21 days from positive testing.  Flash pulmonary edema, acute HFrEF due to NSTEMI, Takotsubo cardiomyopathy: Echo with new wall motion abnormalities. - Lasix 80mg  IV  BID, incomplete I/O, measure I/O, daily weights - Troponin trending down as of 12/22 PM. Continue BB. - Cardiology consulted, pt to remain on anticoagulation. Not a candidate for invasive intervention based on poor risk profile including penetrating thoracic aortic ulceration. Also echocardiogram reportedly most consistent with stress-induced cardiomyopathy.   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   UTI: Sepsis has been ruled out. - s/p ceftriaxone, cultures without growth  1 of 4 blood cultures grew S. capitis, consistent with contaminant.   Lymphopenia, thrombocytopenia:  - Due to covid, resolved.   AKI: Resolved. Cr relatively stable. - Continue monitoring.   Generalized weakness, poor per oral intake: Suspected sequelae of covid infection.  - PT recommends SNF for rehabilitation, though the patient and family decline this and wish to take the patient home.  Malpositioned PICC line: Based on XR 12/19, subsequently pulled.  Poor IV access, difficult stick: s/p new PICC 12/22 double lumen RUE.   Dementia: Stable, may be more accurately MCI.  DVT prophylaxis: Lovenox Code Status: DNR Family Communication: Niece who is caretaker updated by phone 12/21 and 12/22, no answer 12/23  Disposition Plan:  Status is: Inpatient  Remains inpatient appropriate because:Unsafe d/c plan and Inpatient level of care appropriate due to severity of illness  Dispo: The patient is from: Home              Anticipated d/c is to: Home              Anticipated d/c date is: 3 days  Patient currently is not medically stable to d/c.  Consultants:   Palliative care  Cardiology  Procedures:   None  Antimicrobials:  Ceftriaxone, remdesivir   Subjective: Pt not willing to talk  to me this morning. Denies pain or dyspnea. Sitting with nasal cannula out of nose, 81% SpO2.  Objective: Vitals:   09/09/20 2135 09/10/20 0500 09/10/20 0539 09/10/20 1044  BP:   128/89 (!) 151/69  Pulse:   75 80  Resp:   16   Temp:   98.5 F (36.9 C)   TempSrc:   Oral   SpO2: 94%  94% 99%  Weight:  64.9 kg    Height:        Intake/Output Summary (Last 24 hours) at 09/10/2020 1242 Last data filed at 09/10/2020 0500 Gross per 24 hour  Intake --  Output 1600 ml  Net -1600 ml   Filed Weights   08/31/20 1934 09/09/20 0500 09/10/20 0500  Weight: 62.6 kg 64.2 kg 64.9 kg   Gen: Elderly, blind female in no distress Pulm: Nonlabored breathing but tachypneic and significantly hypoxic with bilateral crackles.. CV: Regular rate and rhythm. No murmur, rub, or gallop. No JVD, trace dependent edema. GI: Abdomen soft, non-tender, non-distended, with normoactive bowel sounds.  Ext: Warm, no deformities Skin: No new rashes, lesions or ulcers on visualized skin. Neuro: Alert and not cooperative with exam  Psych: Cranky.  Data Reviewed: I have personally reviewed following labs and imaging studies  CBC: Recent Labs  Lab 09/04/20 0636 09/05/20 0355 09/07/20 0310 09/09/20 0515 09/10/20 0409  WBC 7.2 8.5 10.8* 9.3 8.0  NEUTROABS 5.9 6.8 8.2* 7.9*  --   HGB 12.6 12.6 15.4* 13.5 12.2  HCT 38.6 38.7 47.7* 41.0 36.8  MCV 95.3 96.3 96.4 94.0 96.1  PLT 213 207 242 250 248   Basic Metabolic Panel: Recent Labs  Lab 09/05/20 0355 09/07/20 0310 09/08/20 0326 09/09/20 0515 09/10/20 0409  NA 144 139 137 136 138  K 4.1 4.0 4.0 3.5 3.4*  CL 110 104 104 100 97*  CO2 26 24 21* 25 31  GLUCOSE 131* 151* 175* 215* 168*  BUN 17 9 8 12 18   CREATININE 0.58 0.54 0.39* 0.58 0.69  CALCIUM 7.8* 7.9* 7.5* 7.5* 7.4*   GFR: Estimated Creatinine Clearance: 43.7 mL/min (by C-G formula based on SCr of 0.69 mg/dL). Liver Function Tests: Recent Labs  Lab 09/04/20 0636 09/05/20 0355 09/07/20 0310  09/08/20 0326 09/09/20 0515  AST 27 34 30 26 25   ALT 16 23 37 28 25  ALKPHOS 38 38 50 44 43  BILITOT 0.9 0.8 1.0 1.3* 0.9  PROT 5.2* 4.9* 5.3* 4.8* 5.1*  ALBUMIN 2.2* 2.1* 2.4* 2.1* 2.2*   CBG: No results for input(s): GLUCAP in the last 168 hours. Urine analysis:    Component Value Date/Time   COLORURINE AMBER (A) 08/31/2020 2252   APPEARANCEUR CLEAR 08/31/2020 2252   LABSPEC 1.018 08/31/2020 2252   PHURINE 5.0 08/31/2020 2252   GLUCOSEU NEGATIVE 08/31/2020 2252   HGBUR SMALL (A) 08/31/2020 2252   BILIRUBINUR NEGATIVE 08/31/2020 2252   BILIRUBINUR neg 07/18/2014 1320   KETONESUR NEGATIVE 08/31/2020 2252   PROTEINUR 30 (A) 08/31/2020 2252   UROBILINOGEN 0.2 07/18/2014 1320   UROBILINOGEN 0.2 06/14/2014 1236   NITRITE NEGATIVE 08/31/2020 2252   LEUKOCYTESUR MODERATE (A) 08/31/2020 2252   Recent Results (from the past 240 hour(s))  Blood Culture (routine x 2)     Status: None   Collection Time: 08/31/20  8:10  PM   Specimen: BLOOD  Result Value Ref Range Status   Specimen Description   Final    BLOOD LEFT ANTECUBITAL Performed at [facility], [address], [place], [place] [address]    Special Requests   Final    BOTTLES DRAWN AEROBIC AND ANAEROBIC Blood Culture adequate volume Performed at [facility], [address] [place], [place] [address]    Culture   Final    NO GROWTH 5 DAYS Performed at [facility], [address] [place], [place] [address]    Report Status 09/05/2020 FINAL  Final  Resp Panel by RT-PCR (Flu A&B, Covid) Nasopharyngeal Swab     Status: Abnormal   Collection Time: 08/31/20  8:11 PM   Specimen: Nasopharyngeal Swab; Nasopharyngeal(NP) swabs in vial transport medium  Result Value Ref Range Status   SARS Coronavirus 2 by RT PCR POSITIVE (A) NEGATIVE Final    Comment: LACIVITA H. 12.12.21 @ 2321 BY MECIAL J. (NOTE) SARS-CoV-2 target nucleic acids are DETECTED.  The SARS-CoV-2 RNA is generally  detectable in upper respiratory specimens during the acute phase of infection. Positive results are indicative of the presence of the identified virus, but do not rule out bacterial infection or co-infection with other pathogens not detected by the test. Clinical correlation with patient history and other diagnostic information is necessary to determine patient infection status. The expected result is Negative.  Fact Sheet for Patients: BloggerCourse.com  Fact Sheet for Healthcare Providers: SeriousBroker.it  This test is not yet approved or cleared by the [place] FDA and  has been authorized for detection and/or diagnosis of SARS-CoV-2 by FDA under an Emergency Use Authorization (EUA).  This EUA will remain in effect (meaning this test can be used) for the duration of  the COVID-19 de claration under Section 564(b)(1) of the Act, 21 U.S.C. section 360bbb-3(b)(1), unless the authorization is terminated or revoked sooner.     Influenza A by PCR NEGATIVE NEGATIVE Final   Influenza B by PCR NEGATIVE NEGATIVE Final    Comment: (NOTE) The Xpert Xpress SARS-CoV-2/FLU/RSV plus assay is intended as an aid in the diagnosis of influenza from Nasopharyngeal swab specimens and should not be used as a sole basis for treatment. Nasal washings and aspirates are unacceptable for Xpert Xpress SARS-CoV-2/FLU/RSV testing.  Fact Sheet for Patients: BloggerCourse.com  Fact Sheet for Healthcare Providers: SeriousBroker.it  This test is not yet approved or cleared by the [place] FDA and has been authorized for detection and/or diagnosis of SARS-CoV-2 by FDA under an Emergency Use Authorization (EUA). This EUA will remain in effect (meaning this test can be used) for the duration of the COVID-19 declaration under Section 564(b)(1) of the Act, 21 U.S.C. section 360bbb-3(b)(1), unless the  authorization is terminated or revoked.  Performed at [facility], [address] [place], [place] [address]   Urine culture     Status: None   Collection Time: 08/31/20 10:52 PM   Specimen: In/Out Cath Urine  Result Value Ref Range Status   Specimen Description   Final    IN/OUT CATH URINE Performed at [facility], [address], [place], [place] [address]    Special Requests   Final    NONE Performed at [facility], [address] [place], [place] [address]    Culture   Final    NO GROWTH Performed at [facility], [address] [place], [place] [address]  Report Status 09/01/2020 FINAL  Final  Blood Culture (routine x 2)     Status: Abnormal   Collection Time: 09/01/20 10:11 AM   Specimen: BLOOD LEFT HAND  Result Value Ref Range Status   Specimen Description   Final    BLOOD LEFT HAND Performed at [facility], [address] [place], [place] [address]    Special Requests   Final    BOTTLES DRAWN AEROBIC ONLY Blood Culture adequate volume Performed at [facility], [address] [place], [place] [address]    Culture  Setup Time   Final    GRAM POSITIVE COCCI IN CLUSTERS AEROBIC BOTTLE ONLY Organism ID to follow CRITICAL RESULT CALLED TO, READ BACK BY AND VERIFIED WITH: D. Wofford PharmD 15:45 09/03/20 ([email or web address])    Culture (A)  Final    STAPHYLOCOCCUS CAPITIS THE SIGNIFICANCE OF ISOLATING THIS ORGANISM FROM A SINGLE SET OF BLOOD CULTURES WHEN MULTIPLE SETS ARE DRAWN IS UNCERTAIN. PLEASE NOTIFY THE MICROBIOLOGY DEPARTMENT WITHIN ONE WEEK IF SPECIATION AND SENSITIVITIES ARE REQUIRED. Performed at [facility], [address], [place], [place] [address]    Report Status 09/04/2020 FINAL  Final  Blood Culture ID Panel (Reflexed)     Status: Abnormal   Collection Time: 09/01/20 10:11 AM  Result Value Ref Range Status   Enterococcus faecalis NOT  DETECTED NOT DETECTED Final   Enterococcus Faecium NOT DETECTED NOT DETECTED Final   Listeria monocytogenes NOT DETECTED NOT DETECTED Final   Staphylococcus species DETECTED (A) NOT DETECTED Final    Comment: CRITICAL RESULT CALLED TO, READ BACK BY AND VERIFIED WITH: D. Wofford PharmD 15:45 09/03/20 ([email or web address])    Staphylococcus aureus (BCID) NOT DETECTED NOT DETECTED Final   Staphylococcus epidermidis NOT DETECTED NOT DETECTED Final   Staphylococcus lugdunensis NOT DETECTED NOT DETECTED Final   Streptococcus species NOT DETECTED NOT DETECTED Final   Streptococcus agalactiae NOT DETECTED NOT DETECTED Final   Streptococcus pneumoniae NOT DETECTED NOT DETECTED Final   Streptococcus pyogenes NOT DETECTED NOT DETECTED Final   A.calcoaceticus-baumannii NOT DETECTED NOT DETECTED Final   Bacteroides fragilis NOT DETECTED NOT DETECTED Final   Enterobacterales NOT DETECTED NOT DETECTED Final   Enterobacter cloacae complex NOT DETECTED NOT DETECTED Final   Escherichia coli NOT DETECTED NOT DETECTED Final   Klebsiella aerogenes NOT DETECTED NOT DETECTED Final   Klebsiella oxytoca NOT DETECTED NOT DETECTED Final   Klebsiella pneumoniae NOT DETECTED NOT DETECTED Final   Proteus species NOT DETECTED NOT DETECTED Final   Salmonella species NOT DETECTED NOT DETECTED Final   Serratia marcescens NOT DETECTED NOT DETECTED Final   Haemophilus influenzae NOT DETECTED NOT DETECTED Final   Neisseria meningitidis NOT DETECTED NOT DETECTED Final   Pseudomonas aeruginosa NOT DETECTED NOT DETECTED Final   Stenotrophomonas maltophilia NOT DETECTED NOT DETECTED Final   Candida albicans NOT DETECTED NOT DETECTED Final   Candida auris NOT DETECTED NOT DETECTED Final   Candida glabrata NOT DETECTED NOT DETECTED Final   Candida krusei NOT DETECTED NOT DETECTED Final   Candida parapsilosis NOT DETECTED NOT DETECTED Final   Candida tropicalis NOT DETECTED NOT DETECTED Final   Cryptococcus neoformans/gattii NOT  DETECTED NOT DETECTED Final    Comment: Performed at [facility], [address] [place], [place] [address]      Radiology Studies: DG CHEST PORT 1 VIEW  Result Date: 09/08/2020 CLINICAL DATA:  Worsening shortness of breath EXAM: PORTABLE CHEST 1 VIEW COMPARISON:  09/08/2020 at 7:32 a.m.  FINDINGS: Single frontal view of the chest demonstrates stable enlargement the cardiac silhouette. Continued ectasia of the thoracic aorta and atherosclerosis unchanged. Bibasilar veiling opacities are again noted, with stable interstitial and ground-glass opacities. Overall, findings favor congestive heart failure. No pneumothorax. No acute bony abnormalities. IMPRESSION: 1. Stable findings of congestive heart failure. Electronically Signed   By: Sharlet Salina M.D.   On: 09/08/2020 15:07   ECHOCARDIOGRAM LIMITED  Result Date: 09/09/2020    ECHOCARDIOGRAM LIMITED REPORT   Patient Name:   Kaitlin Smith Date of Exam: 09/09/2020 Medical Rec #:  [id number]    Height:       65.0 in Accession #:    [id number]   Weight:       141.5 lb Date of Birth:  11-May-1930    BSA:          1.708 m Patient Age:    86 years     BP:           143/72 mmHg Patient Gender: F            HR:           86 bpm. Exam Location:  Inpatient Procedure: Limited Echo, Cardiac Doppler and Color Doppler Indications:    Shortness of breath  History:        Patient has prior history of Echocardiogram examinations, most                 recent 08/17/2018. PAD and Stroke, Signs/Symptoms:Shortness of                 Breath and Pul. edema, Dementia; Risk Factors:Hypertension,                 Diabetes and Dyslipidemia. Covid+.  Sonographer:    Lavenia Atlas Referring Phys: [phone] Tyrone Nine IMPRESSIONS  1. There is severe hypokinesis of the mid septum, all apical segments, and apex. Due to moderate LVH, EF appears to be 30-35% but difficult to assess. Wall motion abnormalities are new. This pattern can be seen in a stress-induced cardiomyopathy  (Takotsubo) vs large wrap around LAD infarction. Clinical correlation is recommended. Left ventricular ejection fraction, by estimation, is 30 to 35%. The left ventricle has moderately decreased function. The left ventricle demonstrates regional wall motion abnormalities (see scoring diagram/findings for description). There is moderate concentric left ventricular hypertrophy. Left ventricular diastolic parameters are consistent with Grade I diastolic dysfunction (impaired relaxation).  2. Right ventricular systolic function is normal. The right ventricular size is normal.  3. The mitral valve is grossly normal. No evidence of mitral valve regurgitation. No evidence of mitral stenosis.  4. The aortic valve is tricuspid. There is mild calcification of the aortic valve. There is mild thickening of the aortic valve. Mild to moderate aortic valve sclerosis/calcification is present, without any evidence of aortic stenosis.  5. The inferior vena cava is normal in size with greater than 50% respiratory variability, suggesting right atrial pressure of 3 mmHg. Comparison(s): Changes from prior study are noted. EF 35-40% with new WMA. FINDINGS  Left Ventricle: There is severe hypokinesis of the mid septum, all apical segments, and apex. Due to moderate LVH, EF appears to be 30-35% but difficult to assess. Wall motion abnormalities are new. This pattern can be seen in a stress-induced cardiomyopathy (Takotsubo) vs large wrap around LAD infarction. Clinical correlation is recommended. Left ventricular ejection fraction, by estimation, is 30 to 35%. The left ventricle has moderately decreased function. The left ventricle demonstrates  regional wall motion abnormalities. The left ventricular internal cavity size was normal in size. There is moderate concentric left ventricular hypertrophy. Left ventricular diastolic parameters are consistent with Grade I diastolic dysfunction (impaired  relaxation).  LV Wall Scoring: The mid and  distal anterior septum and entire apex are hypokinetic. Right Ventricle: The right ventricular size is normal. No increase in right ventricular wall thickness. Right ventricular systolic function is normal. Left Atrium: Left atrial size was normal in size. Right Atrium: Right atrial size was normal in size. Mitral Valve: The mitral valve is grossly normal. No evidence of mitral valve stenosis. Tricuspid Valve: The tricuspid valve is not well visualized. Aortic Valve: The aortic valve is tricuspid. There is mild calcification of the aortic valve. There is mild thickening of the aortic valve. Mild to moderate aortic valve sclerosis/calcification is present, without any evidence of aortic stenosis. Pulmonic Valve: The pulmonic valve was not well visualized. Venous: The inferior vena cava is normal in size with greater than 50% respiratory variability, suggesting right atrial pressure of 3 mmHg. IAS/Shunts: The atrial septum is grossly normal. Additional Comments: There is a small pleural effusion in the left lateral region. LEFT VENTRICLE PLAX 2D LVIDd:         4.00 cm  Diastology LVIDs:         3.50 cm  LV e' medial:    7.07 cm/s LV PW:         1.40 cm  LV E/e' medial:  6.7 LV IVS:        1.60 cm  LV e' lateral:   7.40 cm/s LVOT diam:     2.10 cm  LV E/e' lateral: 6.4 LVOT Area:     3.46 cm  LEFT ATRIUM         Index LA diam:    4.50 cm 2.63 cm/m   AORTA Ao Root diam: 3.40 cm MITRAL VALVE MV Area (PHT): 5.66 cm    SHUNTS MV Decel Time: 134 msec    Systemic Diam: 2.10 cm MV E velocity: 47.50 cm/s MV A velocity: 82.50 cm/s MV E/A ratio:  0.58 Lennie Odor MD Electronically signed by Lennie Odor MD Signature Date/Time: 09/09/2020/12:25:08 PM    Final    [place] EKG SITE RITE  Result Date: 09/08/2020 If Site Rite image not attached, placement could not be confirmed due to current cardiac rhythm.  Scheduled Meds: . aspirin EC  81 mg Oral Daily  . carvedilol  3.125 mg Oral BID WC  . Chlorhexidine Gluconate Cloth   6 each Topical Daily  . enoxaparin (LOVENOX) injection  60 mg Subcutaneous Q12H  . feeding supplement  237 mL Oral BID BM  . furosemide  80 mg Intravenous BID  . losartan  25 mg Oral Daily  . methylPREDNISolone (SOLU-MEDROL) injection  40 mg Intravenous Q12H  . pantoprazole  40 mg Oral Daily  . potassium chloride  40 mEq Oral Daily  . sodium chloride flush  10-40 mL Intracatheter Q12H   Continuous Infusions:    LOS: 9 days   Time spent: 35 minutes.  Tyrone Nine, MD [facility] [email or web address] 09/10/2020, 12:42 PM

## 2020-09-10 NOTE — Progress Notes (Addendum)
Due to the COVID-19 pandemic, this visit was completed with telemedicine (audio/video) technology to reduce patient and provider exposure as well as to preserve personal protective equipment.   Progress Note  Patient Name: Kaitlin Smith Date of Encounter: 09/10/2020  Primary Cardiologist:  Nanetta Batty, MD  Subjective   Attempted to contact pt by phone, pt not able to answer questions on the phone  Inpatient Medications    Scheduled Meds: . aspirin EC  81 mg Oral Daily  . Chlorhexidine Gluconate Cloth  6 each Topical Daily  . enoxaparin (LOVENOX) injection  60 mg Subcutaneous Q12H  . feeding supplement  237 mL Oral BID BM  . furosemide  80 mg Intravenous BID  . methylPREDNISolone (SOLU-MEDROL) injection  40 mg Intravenous Q12H  . metoprolol tartrate  12.5 mg Oral BID  . pantoprazole  40 mg Oral Daily  . potassium chloride  40 mEq Oral Daily  . sodium chloride flush  10-40 mL Intracatheter Q12H   Continuous Infusions:  PRN Meds: acetaminophen **OR** acetaminophen, labetalol, nitroGLYCERIN, ondansetron (ZOFRAN) IV, sodium chloride flush, sodium chloride flush   Vital Signs    Vitals:   09/09/20 2001 09/09/20 2135 09/10/20 0500 09/10/20 0539  BP: 133/62   128/89  Pulse: 86   75  Resp: 18   16  Temp: 99.4 F (37.4 C)   98.5 F (36.9 C)  TempSrc: Oral   Oral  SpO2: 99% 94%  94%  Weight:   64.9 kg   Height:        Intake/Output Summary (Last 24 hours) at 09/10/2020 0914 Last data filed at 09/10/2020 0500 Gross per 24 hour  Intake 60 ml  Output 1600 ml  Net -1540 ml   Filed Weights   08/31/20 1934 09/09/20 0500 09/10/20 0500  Weight: 62.6 kg 64.2 kg 64.9 kg   Last Weight  Most recent update: 09/10/2020  5:37 AM   Weight  64.9 kg (143 lb 1.3 oz)           Weight change: 0.7 kg   Telemetry    SR, ST - Personally Reviewed  ECG    None today - Personally Reviewed  Physical Exam   VITAL SIGNS:  reviewed General: Well developed, well nourished,  female appearing in no acute distress.   Labs    Hematology Recent Labs  Lab 09/07/20 0310 09/09/20 0515 09/10/20 0409  WBC 10.8* 9.3 8.0  RBC 4.95 4.36 3.83*  HGB 15.4* 13.5 12.2  HCT 47.7* 41.0 36.8  MCV 96.4 94.0 96.1  MCH 31.1 31.0 31.9  MCHC 32.3 32.9 33.2  RDW 13.0 13.2 13.3  PLT 242 250 248    Chemistry Recent Labs  Lab 09/07/20 0310 09/08/20 0326 09/09/20 0515 09/10/20 0409  NA 139 137 136 138  K 4.0 4.0 3.5 3.4*  CL 104 104 100 97*  CO2 24 21* 25 31  GLUCOSE 151* 175* 215* 168*  BUN 9 8 12 18   CREATININE 0.54 0.39* 0.58 0.69  CALCIUM 7.9* 7.5* 7.5* 7.4*  PROT 5.3* 4.8* 5.1*  --   ALBUMIN 2.4* 2.1* 2.2*  --   AST 30 26 25   --   ALT 37 28 25  --   ALKPHOS 50 44 43  --   BILITOT 1.0 1.3* 0.9  --   GFRNONAA >60 >60 >60 >60  ANIONGAP 11 12 11 10      High Sensitivity Troponin:   Recent Labs  Lab 09/08/20 1800 09/08/20 2125 09/09/20 0507 09/09/20 2126  TROPONINIHS 96* 1,188* 2,788* 2,651*      BNP Recent Labs  Lab 09/08/20 2125  BNP 1,007.4*     DDimer  Recent Labs  Lab 09/06/20 1858 09/07/20 0913 09/08/20 2125  DDIMER >20.00* 1.02* 1.04*     Radiology    DG CHEST PORT 1 VIEW  Result Date: 09/08/2020 CLINICAL DATA:  Worsening shortness of breath EXAM: PORTABLE CHEST 1 VIEW COMPARISON:  09/08/2020 at 7:32 a.m. FINDINGS: Single frontal view of the chest demonstrates stable enlargement the cardiac silhouette. Continued ectasia of the thoracic aorta and atherosclerosis unchanged. Bibasilar veiling opacities are again noted, with stable interstitial and ground-glass opacities. Overall, findings favor congestive heart failure. No pneumothorax. No acute bony abnormalities. IMPRESSION: 1. Stable findings of congestive heart failure. Electronically Signed   By: Sharlet Salina M.D.   On: 09/08/2020 15:07   DG CHEST PORT 1 VIEW  Result Date: 09/08/2020 CLINICAL DATA:  Acute hypoxic respiratory failure due to COVID EXAM: PORTABLE CHEST 1 VIEW  COMPARISON:  Two days ago FINDINGS: Cardiomegaly and vascular pedicle widening accentuated by rightward rotation. Hazy appearance of the bilateral chest which appears similar to before. No visible effusion or pneumothorax. The right-sided PICC is no longer seen. IMPRESSION: Stable bilateral pulmonary infiltrate Electronically Signed   By: Marnee Spring M.D.   On: 09/08/2020 07:56   ECHOCARDIOGRAM LIMITED  Result Date: 09/09/2020    ECHOCARDIOGRAM LIMITED REPORT   Patient Name:   Kaitlin Smith Date of Exam: 09/09/2020 Medical Rec #:  [id number]    Height:       65.0 in Accession #:    [id number]   Weight:       141.5 lb Date of Birth:  1930-04-14    BSA:          1.708 m Patient Age:    86 years     BP:           143/72 mmHg Patient Gender: F            HR:           86 bpm. Exam Location:  Inpatient Procedure: Limited Echo, Cardiac Doppler and Color Doppler Indications:    Shortness of breath  History:        Patient has prior history of Echocardiogram examinations, most                 recent 08/17/2018. PAD and Stroke, Signs/Symptoms:Shortness of                 Breath and Pul. edema, Dementia; Risk Factors:Hypertension,                 Diabetes and Dyslipidemia. Covid+.  Sonographer:    Lavenia Atlas Referring Phys: [phone] Tyrone Nine IMPRESSIONS  1. There is severe hypokinesis of the mid septum, all apical segments, and apex. Due to moderate LVH, EF appears to be 30-35% but difficult to assess. Wall motion abnormalities are new. This pattern can be seen in a stress-induced cardiomyopathy (Takotsubo) vs large wrap around LAD infarction. Clinical correlation is recommended. Left ventricular ejection fraction, by estimation, is 30 to 35%. The left ventricle has moderately decreased function. The left ventricle demonstrates regional wall motion abnormalities (see scoring diagram/findings for description). There is moderate concentric left ventricular hypertrophy. Left ventricular diastolic parameters are  consistent with Grade I diastolic dysfunction (impaired relaxation).  2. Right ventricular systolic function is normal. The right ventricular size is normal.  3. The mitral valve is grossly  normal. No evidence of mitral valve regurgitation. No evidence of mitral stenosis.  4. The aortic valve is tricuspid. There is mild calcification of the aortic valve. There is mild thickening of the aortic valve. Mild to moderate aortic valve sclerosis/calcification is present, without any evidence of aortic stenosis.  5. The inferior vena cava is normal in size with greater than 50% respiratory variability, suggesting right atrial pressure of 3 mmHg. Comparison(s): Changes from prior study are noted. EF 35-40% with new WMA. FINDINGS  Left Ventricle: There is severe hypokinesis of the mid septum, all apical segments, and apex. Due to moderate LVH, EF appears to be 30-35% but difficult to assess. Wall motion abnormalities are new. This pattern can be seen in a stress-induced cardiomyopathy (Takotsubo) vs large wrap around LAD infarction. Clinical correlation is recommended. Left ventricular ejection fraction, by estimation, is 30 to 35%. The left ventricle has moderately decreased function. The left ventricle demonstrates regional wall motion abnormalities. The left ventricular internal cavity size was normal in size. There is moderate concentric left ventricular hypertrophy. Left ventricular diastolic parameters are consistent with Grade I diastolic dysfunction (impaired  relaxation).  LV Wall Scoring: The mid and distal anterior septum and entire apex are hypokinetic. Right Ventricle: The right ventricular size is normal. No increase in right ventricular wall thickness. Right ventricular systolic function is normal. Left Atrium: Left atrial size was normal in size. Right Atrium: Right atrial size was normal in size. Mitral Valve: The mitral valve is grossly normal. No evidence of mitral valve stenosis. Tricuspid Valve: The  tricuspid valve is not well visualized. Aortic Valve: The aortic valve is tricuspid. There is mild calcification of the aortic valve. There is mild thickening of the aortic valve. Mild to moderate aortic valve sclerosis/calcification is present, without any evidence of aortic stenosis. Pulmonic Valve: The pulmonic valve was not well visualized. Venous: The inferior vena cava is normal in size with greater than 50% respiratory variability, suggesting right atrial pressure of 3 mmHg. IAS/Shunts: The atrial septum is grossly normal. Additional Comments: There is a small pleural effusion in the left lateral region. LEFT VENTRICLE PLAX 2D LVIDd:         4.00 cm  Diastology LVIDs:         3.50 cm  LV e' medial:    7.07 cm/s LV PW:         1.40 cm  LV E/e' medial:  6.7 LV IVS:        1.60 cm  LV e' lateral:   7.40 cm/s LVOT diam:     2.10 cm  LV E/e' lateral: 6.4 LVOT Area:     3.46 cm  LEFT ATRIUM         Index LA diam:    4.50 cm 2.63 cm/m   AORTA Ao Root diam: 3.40 cm MITRAL VALVE MV Area (PHT): 5.66 cm    SHUNTS MV Decel Time: 134 msec    Systemic Diam: 2.10 cm MV E velocity: 47.50 cm/s MV A velocity: 82.50 cm/s MV E/A ratio:  0.58 Lennie OdorWesley O'Neal MD Electronically signed by Lennie OdorWesley O'Neal MD Signature Date/Time: 09/09/2020/12:25:08 PM    Final    US EKG SITE RITE  Result Date: 09/08/2020 If Site Rite image not attached, placement could not be confirmed due to current cardiac rhythm.    Cardiac Studies   ECHO:  09/09/2020 1. There is severe hypokinesis of the mid septum, all apical segments, and apex. Due to moderate LVH, EF appears to be 30-35%  but difficult to assess. Wall motion abnormalities are new. This pattern can be seen in a stress-induced cardiomyopathy  (Takotsubo) vs large wrap around LAD infarction. Clinical correlation is recommended. Left ventricular ejection fraction, by estimation, is 30 to 35%. The left ventricle has moderately decreased function. The left ventricle demonstrates regional  wall motion abnormalities (see scoring diagram/findings for description). There is moderate concentric left ventricular hypertrophy. Left ventricular diastolic parameters are consistent with Grade I diastolic dysfunction  (impaired relaxation).  2. Right ventricular systolic function is normal. The right ventricular  size is normal.  3. The mitral valve is grossly normal. No evidence of mitral valve  regurgitation. No evidence of mitral stenosis.  4. The aortic valve is tricuspid. There is mild calcification of the  aortic valve. There is mild thickening of the aortic valve. Mild to  moderate aortic valve sclerosis/calcification is present, without any  evidence of aortic stenosis.  5. The inferior vena cava is normal in size with greater than 50%  respiratory variability, suggesting right atrial pressure of 3 mmHg.   Comparison(s): Changes from prior study are noted. EF 35-40% with new WMA.   Patient Profile     86 y.o. 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 Pt developed flash pulm edema, troponin elevated, Cards asked to see.  Assessment & Plan    1. Elevated troponin, newly decreased EF - WMA c/w Takotsubo CM, no cath planned - has been started on Lopressor 12.5 mg bid, will change to Coreg 3.125 mg bid - hold off on ARB/Entresto for now, not sure BP will tolerate one of them plus Lasix and BB  2. Pulm Edema, CHF - on Lasix 80 mg IV bid, started 12/21 - I/O are incomplete, wt is trending up - however, oxygenation is improving, O2 needs decreased from 12/22 - continue diuresis  3. Palliative Care - they are seeing, establishing  goals of care - pt is DNR  Otherwise, per IM Principal Problem:   COVID-19 virus infection Active Problems:   Essential hypertension   MCI (mild cognitive impairment)   Aortic dissection (HCC)   Controlled type 2 diabetes mellitus with hyperglycemia (HCC)   COVID   Acute lower UTI   Acute hypoxemic respiratory failure due to COVID-19 [facility])   Non-ST elevation (NSTEMI) myocardial infarction (HCC)   Acute respiratory failure with hypoxia (HCC)   Palliative care by specialist   Goals of care, counseling/discussion   General weakness  Signed, Theodore Demark , PA-C 9:14 AM 09/10/2020 Pager: [phone]  Acute diastolic CHF in the settings of acute respiratory failure with Covid pneumonia Non-STEMI History of penetrating ulcer in the thoracic aorta Peripheral arterial disease History of CVA Dementia  I would continue Lasix 80 mg IV twice daily, monitor I's and O's closely as well as weights, her creatinine is normal. She is still fluid overloaded. I have personally reviewed her echocardiogram and it is consistent with a case of Takotsubo (stress induced) cardiomyopathy, the mainstay of therapy are beta-blockers, I would switch metoprolol to carvedilol 3.125 mg PO BID, uptitrate as tolerated. Add losartan 25 mg po daily.  Tobias Alexander, MD 09/10/2020

## 2020-09-10 NOTE — Plan of Care (Signed)
  Problem: Clinical Measurements: Goal: Diagnostic test results will improve Outcome: Progressing   Problem: Clinical Measurements: Goal: Respiratory complications will improve Outcome: Progressing   Problem: Clinical Measurements: Goal: Cardiovascular complication will be avoided Outcome: Progressing   Problem: Activity: Goal: Risk for activity intolerance will decrease Outcome: Progressing   Problem: Nutrition: Goal: Adequate nutrition will be maintained Outcome: Progressing   Problem: Education: Goal: Knowledge of risk factors and measures for prevention of condition will improve Outcome: Progressing

## 2020-09-11 ENCOUNTER — Inpatient Hospital Stay (HOSPITAL_COMMUNITY): Payer: Medicare Other

## 2020-09-11 LAB — C-REACTIVE PROTEIN: CRP: 1.1 mg/dL — ABNORMAL HIGH (ref <1.0)

## 2020-09-11 LAB — RENAL FUNCTION PANEL
Albumin: 2.3 g/dL — ABNORMAL LOW (ref 3.5–5.0)
Anion gap: 11 (ref 5–15)
BUN: 18 mg/dL (ref 8–23)
CO2: 33 mmol/L — ABNORMAL HIGH (ref 22–32)
Calcium: 7.6 mg/dL — ABNORMAL LOW (ref 8.9–10.3)
Chloride: 96 mmol/L — ABNORMAL LOW (ref 98–111)
Creatinine, Ser: 0.68 mg/dL (ref 0.44–1.00)
GFR, Estimated: >60 mL/min (ref >60)
Glucose, Bld: 103 mg/dL — ABNORMAL HIGH (ref 70–99)
Phosphorus: 2 mg/dL — ABNORMAL LOW (ref 2.5–4.6)
Potassium: 2.9 mmol/L — ABNORMAL LOW (ref 3.5–5.1)
Sodium: 140 mmol/L (ref 135–145)

## 2020-09-11 MED ORDER — POTASSIUM & SODIUM PHOSPHATES 280-160-250 MG PO PACK
1.0000 | PACK | Freq: Three times a day (TID) | ORAL | Status: AC
  Administered 2020-09-11 (×3): 1 via ORAL
  Filled 2020-09-11 (×4): qty 1

## 2020-09-11 MED ORDER — CARVEDILOL 6.25 MG PO TABS
6.2500 mg | ORAL_TABLET | Freq: Two times a day (BID) | ORAL | Status: DC
  Administered 2020-09-11 – 2020-09-12 (×2): 6.25 mg via ORAL
  Filled 2020-09-11: qty 1

## 2020-09-11 MED ORDER — FUROSEMIDE 10 MG/ML IJ SOLN
40.0000 mg | Freq: Two times a day (BID) | INTRAMUSCULAR | Status: DC
  Administered 2020-09-11 – 2020-09-13 (×4): 40 mg via INTRAVENOUS
  Filled 2020-09-11 (×5): qty 4

## 2020-09-11 MED ORDER — POTASSIUM CHLORIDE CRYS ER 20 MEQ PO TBCR
40.0000 meq | EXTENDED_RELEASE_TABLET | Freq: Three times a day (TID) | ORAL | Status: AC
  Administered 2020-09-11 (×3): 40 meq via ORAL
  Filled 2020-09-11 (×3): qty 2

## 2020-09-11 MED ORDER — METHYLPREDNISOLONE SODIUM SUCC 40 MG IJ SOLR
40.0000 mg | Freq: Every day | INTRAMUSCULAR | Status: DC
  Administered 2020-09-11 – 2020-09-12 (×2): 40 mg via INTRAVENOUS
  Filled 2020-09-11 (×2): qty 1

## 2020-09-11 MED ORDER — MAGNESIUM SULFATE 2 GM/50ML IV SOLN
2.0000 g | Freq: Once | INTRAVENOUS | Status: AC
  Administered 2020-09-11: 2 g via INTRAVENOUS
  Filled 2020-09-11: qty 50

## 2020-09-11 NOTE — Progress Notes (Signed)
Due to the COVID-19 pandemic, this visit was completed with telemedicine (audio/video) technology to reduce patient and provider exposure as well as to preserve personal protective equipment.   Progress Note  Patient Name: Kaitlin Smith Date of Encounter: 09/11/2020  Primary Cardiologist:  Nanetta Batty, MD  Subjective   The patient has been doing better, saturating in 90s on room air only.  She diuresed well with 1.1 L overnight.  Inpatient Medications    Scheduled Meds: . aspirin EC  81 mg Oral Daily  . carvedilol  3.125 mg Oral BID WC  . Chlorhexidine Gluconate Cloth  6 each Topical Daily  . enoxaparin (LOVENOX) injection  60 mg Subcutaneous Q12H  . feeding supplement  237 mL Oral BID BM  . furosemide  80 mg Intravenous BID  . losartan  25 mg Oral Daily  . methylPREDNISolone (SOLU-MEDROL) injection  40 mg Intravenous Daily  . pantoprazole  40 mg Oral Daily  . potassium chloride  40 mEq Oral TID  . sodium chloride flush  10-40 mL Intracatheter Q12H   Continuous Infusions:  PRN Meds: acetaminophen **OR** acetaminophen, labetalol, nitroGLYCERIN, ondansetron (ZOFRAN) IV, sodium chloride flush, sodium chloride flush   Vital Signs    Vitals:   09/10/20 1247 09/10/20 2111 09/11/20 0642 09/11/20 0643  BP: 125/64 135/68 134/77   Pulse: 79 84 90   Resp: 20 20 18    Temp: 98.2 F (36.8 C) 99 F (37.2 C) 98.9 F (37.2 C)   TempSrc: Oral Oral Oral   SpO2: 95% 94% 91%   Weight:    61.5 kg  Height:        Intake/Output Summary (Last 24 hours) at 09/11/2020 0843 Last data filed at 09/10/2020 2121 Gross per 24 hour  Intake 0 ml  Output 1150 ml  Net -1150 ml   Filed Weights   09/09/20 0500 09/10/20 0500 09/11/20 0643  Weight: 64.2 kg 64.9 kg 61.5 kg   Last Weight  Most recent update: 09/11/2020  6:43 AM   Weight  61.5 kg (135 lb 9.3 oz)           Weight change: -3.4 kg   Telemetry    SR 70-80' - Personally Reviewed  ECG    None today - Personally  Reviewed  Physical Exam   VITAL SIGNS:  reviewed General: Well developed, well nourished, female appearing in no acute distress.   Labs    Hematology Recent Labs  Lab 09/07/20 0310 09/09/20 0515 09/10/20 0409  WBC 10.8* 9.3 8.0  RBC 4.95 4.36 3.83*  HGB 15.4* 13.5 12.2  HCT 47.7* 41.0 36.8  MCV 96.4 94.0 96.1  MCH 31.1 31.0 31.9  MCHC 32.3 32.9 33.2  RDW 13.0 13.2 13.3  PLT 242 250 248    Chemistry Recent Labs  Lab 09/07/20 0310 09/08/20 0326 09/09/20 0515 09/10/20 0409 09/11/20 0356  NA 139 137 136 138 140  K 4.0 4.0 3.5 3.4* 2.9*  CL 104 104 100 97* 96*  CO2 24 21* 25 31 33*  GLUCOSE 151* 175* 215* 168* 103*  BUN 9 8 12 18 18   CREATININE 0.54 0.39* 0.58 0.69 0.68  CALCIUM 7.9* 7.5* 7.5* 7.4* 7.6*  PROT 5.3* 4.8* 5.1*  --   --   ALBUMIN 2.4* 2.1* 2.2*  --  2.3*  AST 30 26 25   --   --   ALT 37 28 25  --   --   ALKPHOS 50 44 43  --   --   BILITOT  1.0 1.3* 0.9  --   --   GFRNONAA >60 >60 >60 >60 >60  ANIONGAP 11 12 11 10 11      High Sensitivity Troponin:   Recent Labs  Lab 09/08/20 1800 09/08/20 2125 09/09/20 0507 09/09/20 0826  TROPONINIHS 96* 1,188* 2,788* 2,651*      BNP Recent Labs  Lab 09/08/20 2125  BNP 1,007.4*     DDimer  Recent Labs  Lab 09/06/20 1858 09/07/20 0913 09/08/20 2125  DDIMER >20.00* 1.02* 1.04*     Radiology    DG CHEST PORT 1 VIEW  Result Date: 09/08/2020 CLINICAL DATA:  Worsening shortness of breath EXAM: PORTABLE CHEST 1 VIEW COMPARISON:  09/08/2020 at 7:32 a.m. FINDINGS: Single frontal view of the chest demonstrates stable enlargement the cardiac silhouette. Continued ectasia of the thoracic aorta and atherosclerosis unchanged. Bibasilar veiling opacities are again noted, with stable interstitial and ground-glass opacities. Overall, findings favor congestive heart failure. No pneumothorax. No acute bony abnormalities. IMPRESSION: 1. Stable findings of congestive heart failure. Electronically Signed   By:  09/10/2020 M.D.   On: 09/08/2020 15:07   DG CHEST PORT 1 VIEW  Result Date: 09/08/2020 CLINICAL DATA:  Acute hypoxic respiratory failure due to COVID EXAM: PORTABLE CHEST 1 VIEW COMPARISON:  Two days ago FINDINGS: Cardiomegaly and vascular pedicle widening accentuated by rightward rotation. Hazy appearance of the bilateral chest which appears similar to before. No visible effusion or pneumothorax. The right-sided PICC is no longer seen. IMPRESSION: Stable bilateral pulmonary infiltrate Electronically Signed   By: 09/10/2020 M.D.   On: 09/08/2020 07:56   ECHOCARDIOGRAM LIMITED  Result Date: 09/09/2020    ECHOCARDIOGRAM LIMITED REPORT   Patient Name:   Kaitlin Smith Date of Exam: 09/09/2020 Medical Rec #:  09/11/2020    Height:       65.0 in Accession #:    [id number]   Weight:       141.5 lb Date of Birth:  May 14, 1930    BSA:          1.708 m Patient Age:    86 years     BP:           143/72 mmHg Patient Gender: F            HR:           86 bpm. Exam Location:  Inpatient Procedure: Limited Echo, Cardiac Doppler and Color Doppler Indications:    Shortness of breath  History:        Patient has prior history of Echocardiogram examinations, most                 recent 08/17/2018. PAD and Stroke, Signs/Symptoms:Shortness of                 Breath and Pul. edema, Dementia; Risk Factors:Hypertension,                 Diabetes and Dyslipidemia. Covid+.  Sonographer:    08/19/2018 Referring Phys: [id number] IMPRESSIONS  1. There is severe hypokinesis of the mid septum, all apical segments, and apex. Due to moderate LVH, EF appears to be 30-35% but difficult to assess. Wall motion abnormalities are new. This pattern can be seen in a stress-induced cardiomyopathy (Takotsubo) vs large wrap around LAD infarction. Clinical correlation is recommended. Left ventricular ejection fraction, by estimation, is 30 to 35%. The left ventricle has moderately decreased function. The left ventricle demonstrates  regional wall motion abnormalities (see  scoring diagram/findings for description). There is moderate concentric left ventricular hypertrophy. Left ventricular diastolic parameters are consistent with Grade I diastolic dysfunction (impaired relaxation).  2. Right ventricular systolic function is normal. The right ventricular size is normal.  3. The mitral valve is grossly normal. No evidence of mitral valve regurgitation. No evidence of mitral stenosis.  4. The aortic valve is tricuspid. There is mild calcification of the aortic valve. There is mild thickening of the aortic valve. Mild to moderate aortic valve sclerosis/calcification is present, without any evidence of aortic stenosis.  5. The inferior vena cava is normal in size with greater than 50% respiratory variability, suggesting right atrial pressure of 3 mmHg. Comparison(s): Changes from prior study are noted. EF 35-40% with new WMA. FINDINGS  Left Ventricle: There is severe hypokinesis of the mid septum, all apical segments, and apex. Due to moderate LVH, EF appears to be 30-35% but difficult to assess. Wall motion abnormalities are new. This pattern can be seen in a stress-induced cardiomyopathy (Takotsubo) vs large wrap around LAD infarction. Clinical correlation is recommended. Left ventricular ejection fraction, by estimation, is 30 to 35%. The left ventricle has moderately decreased function. The left ventricle demonstrates regional wall motion abnormalities. The left ventricular internal cavity size was normal in size. There is moderate concentric left ventricular hypertrophy. Left ventricular diastolic parameters are consistent with Grade I diastolic dysfunction (impaired  relaxation).  LV Wall Scoring: The mid and distal anterior septum and entire apex are hypokinetic. Right Ventricle: The right ventricular size is normal. No increase in right ventricular wall thickness. Right ventricular systolic function is normal. Left Atrium: Left atrial size  was normal in size. Right Atrium: Right atrial size was normal in size. Mitral Valve: The mitral valve is grossly normal. No evidence of mitral valve stenosis. Tricuspid Valve: The tricuspid valve is not well visualized. Aortic Valve: The aortic valve is tricuspid. There is mild calcification of the aortic valve. There is mild thickening of the aortic valve. Mild to moderate aortic valve sclerosis/calcification is present, without any evidence of aortic stenosis. Pulmonic Valve: The pulmonic valve was not well visualized. Venous: The inferior vena cava is normal in size with greater than 50% respiratory variability, suggesting right atrial pressure of 3 mmHg. IAS/Shunts: The atrial septum is grossly normal. Additional Comments: There is a small pleural effusion in the left lateral region. LEFT VENTRICLE PLAX 2D LVIDd:         4.00 cm  Diastology LVIDs:         3.50 cm  LV e' medial:    7.07 cm/s LV PW:         1.40 cm  LV E/e' medial:  6.7 LV IVS:        1.60 cm  LV e' lateral:   7.40 cm/s LVOT diam:     2.10 cm  LV E/e' lateral: 6.4 LVOT Area:     3.46 cm  LEFT ATRIUM         Index LA diam:    4.50 cm 2.63 cm/m   AORTA Ao Root diam: 3.40 cm MITRAL VALVE MV Area (PHT): 5.66 cm    SHUNTS MV Decel Time: 134 msec    Systemic Diam: 2.10 cm MV E velocity: 47.50 cm/s MV A velocity: 82.50 cm/s MV E/A ratio:  0.58 Lennie OdorWesley O'Neal MD Electronically signed by Lennie OdorWesley O'Neal MD Signature Date/Time: 09/09/2020/12:25:08 PM    Final    US EKG SITE RITE  Result Date: 09/08/2020 If Site Rite image  not attached, placement could not be confirmed due to current cardiac rhythm.    Cardiac Studies   ECHO:  09/09/2020 1. There is severe hypokinesis of the mid septum, all apical segments, and apex. Due to moderate LVH, EF appears to be 30-35% but difficult to assess. Wall motion abnormalities are new. This pattern can be seen in a stress-induced cardiomyopathy  (Takotsubo) vs large wrap around LAD infarction. Clinical  correlation is recommended. Left ventricular ejection fraction, by estimation, is 30 to 35%. The left ventricle has moderately decreased function. The left ventricle demonstrates regional wall motion abnormalities (see scoring diagram/findings for description). There is moderate concentric left ventricular hypertrophy. Left ventricular diastolic parameters are consistent with Grade I diastolic dysfunction  (impaired relaxation).  2. Right ventricular systolic function is normal. The right ventricular  size is normal.  3. The mitral valve is grossly normal. No evidence of mitral valve  regurgitation. No evidence of mitral stenosis.  4. The aortic valve is tricuspid. There is mild calcification of the  aortic valve. There is mild thickening of the aortic valve. Mild to  moderate aortic valve sclerosis/calcification is present, without any  evidence of aortic stenosis.  5. The inferior vena cava is normal in size with greater than 50%  respiratory variability, suggesting right atrial pressure of 3 mmHg.   Comparison(s): Changes from prior study are noted. EF 35-40% with new WMA.   Patient Profile     86 y.o. 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 Pt developed flash pulm edema, troponin elevated, Cards asked to see.  Assessment & Plan    1. Elevated troponin, newly decreased EF - WMA c/w Takotsubo CM, no cath planned - I have personally reviewed her echocardiogram and it is consistent with a case of Takotsubo (stress induced) cardiomyopathy, the mainstay of therapy are beta-blockers, we switched metoprolol to carvedilol 3.125 mg PO BID  yesterday she is tolerating it well, I will uptitrate carvedilol to 6.25 mg p.o. twice daily, continue losartan 25 mg po daily.  2. Pulm Edema, CHF - on Lasix 80 mg IV bid, started 12/21 I would decrease to 40 mg IV twice daily, she has diuresed 1.1 L overnight and overall down 3.4 kg - however, oxygenation is improving, she is only on room air and saturating in 90s now - continue diuresis -We will replace potassium 40   3. Palliative Care - they are seeing, establishing goals of care - pt is DNR  Otherwise, per IM Principal Problem:   COVID-19 virus infection Active Problems:   Essential hypertension   MCI (mild cognitive impairment)   Aortic dissection (HCC)   Controlled type 2 diabetes mellitus with hyperglycemia (HCC)   COVID   Acute lower UTI   Acute hypoxemic respiratory failure due to COVID-19 [facility])   Non-ST elevation (NSTEMI) myocardial infarction [facility])   Acute respiratory failure with hypoxia [facility])   Palliative care by specialist   Goals of care, counseling/discussion   General weakness  Tobias Alexander, MD 09/11/2020

## 2020-09-11 NOTE — Progress Notes (Signed)
PROGRESS NOTE  Kaitlin Smith  OIZ:[id number] DOB: 06/19/1930 DOA: 08/31/2020 PCP: Alysia Penna, MD   Brief Narrative: Kaitlin Smith is an 86 y.o. female with a history of dementia, T2DM, HTN, CVA, PVD, AAA who presented to the ED 12/13 with generalized weakness. In the ED she was febrile with elevated lactic acid improved with fluids. CT head nonacute. Urinalysis suggested UTI for which ceftriaxone was given. SARS-CoV-2 PCR was positive, though CXR was without infiltrates and patient without respiratory symptoms/signs or hypoxia. She was admitted, found to have acute DVT of the right popliteal vein for which anticoagulation was started. On 12/21 the patient experienced abrupt onset chest pain and dyspnea with worsening hypoxia due to flash pulmonary edema due to NSTEMI/takotsubo cardiomyopathy. Cardiology was consulted and medical management including diuresis is recommended.  Assessment & Plan: Principal Problem:   COVID-19 virus infection Active Problems:   Essential hypertension   MCI (mild cognitive impairment)   Aortic dissection (HCC)   Controlled type 2 diabetes mellitus with hyperglycemia (HCC)   COVID   Acute lower UTI   Acute hypoxemic respiratory failure due to COVID-19 [facility])   Non-ST elevation (NSTEMI) myocardial infarction [facility])   Acute respiratory failure with hypoxia (HCC)   Palliative care by specialist   Goals of care, counseling/discussion   General weakness  Acute hypoxemic respiratory failure due to covid-19 pneumonia and then acute pulmonary edema: SARS-CoV-2 PCR positive on 12/13.  - Completed remdesivir (12/15 - 12/19) - Continue steroids (started 12/15), will taper with reduction of CRP and improving hypoxia. Hopefully can stop soon.  - Continue getting OOB, using IS as much as possible  Flash pulmonary edema, acute HFrEF due to NSTEMI, Takotsubo cardiomyopathy: Echo with new wall motion abnormalities. - Lasix 40mg  IV BID, good diuresis (1.9L/24hrs) with  improving hypoxia. - Cardiology consulted, pt to remain on anticoagulation. Not a candidate for invasive intervention based on poor risk profile including penetrating thoracic aortic ulceration. Also echocardiogram reportedly most consistent with stress-induced cardiomyopathy. Continue beta blocker, ARB.  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   Hypokalemia: Despite supplementation in face of aggressive diuresis.  - Supplement through today and recheck in AM. Give empiric Mg.   UTI: Sepsis has been ruled out. - s/p ceftriaxone, cultures without growth  1 of 4 blood cultures grew S. capitis, consistent with contaminant.   Lymphopenia, thrombocytopenia:  - Due to covid, resolved.   AKI: Resolved. Cr relatively stable. - Continue monitoring.   Generalized weakness, poor per oral intake: Suspected sequelae of covid infection. Remains.  - PT recommends SNF for rehabilitation, though the patient and family decline this and wish to take the patient home.  Malpositioned PICC line: Based on XR 12/19, subsequently pulled.  Poor IV access, difficult stick: s/p new PICC 12/22 double lumen RUE.   Dementia: Stable, may be more accurately MCI.  DVT prophylaxis: Lovenox Code Status: DNR Family Communication: Niece who is caretaker updated by phone 12/21 and 12/22, no answer 12/23 or 12/24.  Disposition Plan:  Status is: Inpatient  Remains inpatient appropriate because:Unsafe d/c plan and Inpatient level of care appropriate due to severity of illness  Dispo: The patient is from: Home              Anticipated d/c is to: Home  Anticipated d/c date is: 3 days              Patient currently is not medically stable to d/c.  Consultants:   Palliative  care  Cardiology  Procedures:   None  Antimicrobials:  Ceftriaxone, remdesivir   Subjective: Pt getting bathed this AM, denies any complaints or dyspnea or pain. Oxygen is out of her nose and SpO2 drops into 80%'s still. Cardiology stated she was >90% on room air though this isn't the case when I was in the room.  Objective: Vitals:   09/11/20 0642 09/11/20 0643 09/11/20 1000 09/11/20 1207  BP: 134/77     Pulse: 90     Resp: 18     Temp: 98.9 F (37.2 C)     TempSrc: Oral     SpO2: 91%  90% (!) 88%  Weight:  61.5 kg    Height:        Intake/Output Summary (Last 24 hours) at 09/11/2020 1216 Last data filed at 09/11/2020 0830 Gross per 24 hour  Intake 200 ml  Output 900 ml  Net -700 ml   Filed Weights   09/09/20 0500 09/10/20 0500 09/11/20 0643  Weight: 64.2 kg 64.9 kg 61.5 kg   Gen: 86 y.o. female in no distress Pulm: Nonlabored, crackles still at bases laterally, better aeration globally. CV: Regular rate and rhythm with occasional PAC. No murmur, rub, or gallop. No JVD, no significant dependent edema. GI: Abdomen soft, non-tender, non-distended, with normoactive bowel sounds.  Ext: Warm, no deformities Skin: No new rashes, lesions or ulcers on visualized skin. Neuro: Alert and not oriented, moves all extremities but not cooperative with full exam. Psych: Judgement and insight appear impaired, calm.   Data Reviewed: I have personally reviewed following labs and imaging studies  CBC: Recent Labs  Lab 09/05/20 0355 09/07/20 0310 09/09/20 0515 09/10/20 0409  WBC 8.5 10.8* 9.3 8.0  NEUTROABS 6.8 8.2* 7.9*  --   HGB 12.6 15.4* 13.5 12.2  HCT 38.7 47.7* 41.0 36.8  MCV 96.3 96.4 94.0 96.1  PLT 207 242 250 248   Basic Metabolic Panel: Recent Labs  Lab 09/07/20 0310 09/08/20 0326 09/09/20 0515 09/10/20 0409 09/11/20 0356  NA 139 137 136 138 140  K 4.0 4.0 3.5 3.4* 2.9*  CL 104 104 100 97* 96*  CO2 24 21* 25 31 33*  GLUCOSE 151* 175* 215* 168* 103*   BUN 9 8 12 18 18   CREATININE 0.54 0.39* 0.58 0.69 0.68  CALCIUM 7.9* 7.5* 7.5* 7.4* 7.6*  PHOS  --   --   --   --  2.0*   GFR: Estimated Creatinine Clearance: 43.7 mL/min (by C-G formula based on SCr of 0.68 mg/dL). Liver Function Tests: Recent Labs  Lab 09/05/20 0355 09/07/20 0310 09/08/20 0326 09/09/20 0515 09/11/20 0356  AST 34 30 26 25   --   ALT 23 37 28 25  --   ALKPHOS 38 50 44 43  --   BILITOT 0.8 1.0 1.3* 0.9  --   PROT 4.9* 5.3* 4.8* 5.1*  --   ALBUMIN 2.1* 2.4* 2.1* 2.2* 2.3*   CBG: No results for input(s): GLUCAP in the last 168 hours. Urine analysis:    Component Value Date/Time   COLORURINE AMBER (A) 08/31/2020 2252   APPEARANCEUR CLEAR 08/31/2020 2252   LABSPEC 1.018 08/31/2020 2252   PHURINE 5.0 08/31/2020 2252   GLUCOSEU NEGATIVE 08/31/2020 2252   HGBUR SMALL (A) 08/31/2020 2252   BILIRUBINUR NEGATIVE 08/31/2020  2252   BILIRUBINUR neg 07/18/2014 1320   KETONESUR NEGATIVE 08/31/2020 2252   PROTEINUR 30 (A) 08/31/2020 2252   UROBILINOGEN 0.2 07/18/2014 1320   UROBILINOGEN 0.2 06/14/2014 1236   NITRITE NEGATIVE 08/31/2020 2252   LEUKOCYTESUR MODERATE (A) 08/31/2020 2252   No results found for this or any previous visit (from the past 240 hour(s)).    Radiology Studies: DG Chest 1 View  Result Date: 09/11/2020 CLINICAL DATA:  COVID pneumonia. EXAM: CHEST  1 VIEW COMPARISON:  09/08/2020 FINDINGS: 0902 hours. Cardiopericardial silhouette is at upper limits of normal for size. Basilar predominant, right greater than left airspace disease is similar to prior. No substantial pleural effusion. Right PICC line tip overlies the mid SVC level. Telemetry leads overlie the chest. IMPRESSION: Persistent basilar predominant airspace disease, right greater than left without substantial change. Electronically Signed   By: Kennith Center M.D.   On: 09/11/2020 09:35   Scheduled Meds:  aspirin EC  81 mg Oral Daily   carvedilol  6.25 mg Oral BID WC   Chlorhexidine  Gluconate Cloth  6 each Topical Daily   enoxaparin (LOVENOX) injection  60 mg Subcutaneous Q12H   feeding supplement  237 mL Oral BID BM   furosemide  40 mg Intravenous BID   losartan  25 mg Oral Daily   methylPREDNISolone (SOLU-MEDROL) injection  40 mg Intravenous Daily   pantoprazole  40 mg Oral Daily   potassium & sodium phosphates  1 packet Oral TID   potassium chloride  40 mEq Oral TID   sodium chloride flush  10-40 mL Intracatheter Q12H   Continuous Infusions:    LOS: 10 days   Time spent: 35 minutes.  Tyrone Nine, MD [facility] [email or web address] 09/11/2020, 12:16 PM

## 2020-09-12 DIAGNOSIS — I5181 Takotsubo syndrome: Secondary | ICD-10-CM

## 2020-09-12 DIAGNOSIS — I5041 Acute combined systolic (congestive) and diastolic (congestive) heart failure: Secondary | ICD-10-CM

## 2020-09-12 LAB — MAGNESIUM: Magnesium: 2.1 mg/dL (ref 1.7–2.4)

## 2020-09-12 LAB — RENAL FUNCTION PANEL
Albumin: 2.4 g/dL — ABNORMAL LOW (ref 3.5–5.0)
Anion gap: 10 (ref 5–15)
BUN: 12 mg/dL (ref 8–23)
CO2: 32 mmol/L (ref 22–32)
Calcium: 8 mg/dL — ABNORMAL LOW (ref 8.9–10.3)
Chloride: 99 mmol/L (ref 98–111)
Creatinine, Ser: 0.69 mg/dL (ref 0.44–1.00)
GFR, Estimated: >60 mL/min (ref >60)
Glucose, Bld: 130 mg/dL — ABNORMAL HIGH (ref 70–99)
Phosphorus: 2.1 mg/dL — ABNORMAL LOW (ref 2.5–4.6)
Potassium: 4.6 mmol/L (ref 3.5–5.1)
Sodium: 141 mmol/L (ref 135–145)

## 2020-09-12 LAB — C-REACTIVE PROTEIN: CRP: 2.1 mg/dL — ABNORMAL HIGH (ref <1.0)

## 2020-09-12 MED ORDER — CARVEDILOL 12.5 MG PO TABS
12.5000 mg | ORAL_TABLET | Freq: Two times a day (BID) | ORAL | Status: DC
  Administered 2020-09-12 – 2020-09-13 (×3): 12.5 mg via ORAL
  Filled 2020-09-12 (×4): qty 1

## 2020-09-12 MED ORDER — LOSARTAN POTASSIUM 50 MG PO TABS
50.0000 mg | ORAL_TABLET | Freq: Every day | ORAL | Status: DC
  Administered 2020-09-12 – 2020-09-13 (×2): 50 mg via ORAL
  Filled 2020-09-12 (×2): qty 1

## 2020-09-12 MED ORDER — PREDNISONE 10 MG PO TABS: 10.0000 mg | ORAL_TABLET | Freq: Every day | ORAL | Status: DC

## 2020-09-12 NOTE — Plan of Care (Signed)
  Problem: Education: Goal: Knowledge of risk factors and measures for prevention of condition will improve Outcome: Progressing   Problem: Coping: Goal: Psychosocial and spiritual needs will be supported Outcome: Progressing   Problem: Respiratory: Goal: Will maintain a patent airway Outcome: Progressing Goal: Complications related to the disease process, condition or treatment will be avoided or minimized Outcome: Progressing   Problem: Education: Goal: Knowledge of risk factors and measures for prevention of condition will improve Outcome: Progressing   Problem: Coping: Goal: Psychosocial and spiritual needs will be supported Outcome: Progressing   Problem: Respiratory: Goal: Will maintain a patent airway Outcome: Progressing

## 2020-09-12 NOTE — Progress Notes (Signed)
PROGRESS NOTE  Kaitlin Smith  IOX:[id number] DOB: [birth year] DOA: 08/31/2020 PCP: Alysia Penna, MD   Brief Narrative: Kaitlin Smith is an 86 y.o. female with a history of dementia, T2DM, HTN, CVA, PVD, AAA who presented to the ED 12/13 with generalized weakness. In the ED she was febrile with elevated lactic acid improved with fluids. CT head nonacute. Urinalysis suggested UTI for which ceftriaxone was given. SARS-CoV-2 PCR was positive, though CXR was without infiltrates and patient without respiratory symptoms/signs or hypoxia. She was admitted, found to have acute DVT of the right popliteal vein for which anticoagulation was started. On 12/21 the patient experienced abrupt onset chest pain and dyspnea with worsening hypoxia due to flash pulmonary edema due to NSTEMI/takotsubo cardiomyopathy. Cardiology was consulted and medical management including diuresis is recommended.  Assessment & Plan: Principal Problem:   COVID-19 virus infection Active Problems:   Essential hypertension   MCI (mild cognitive impairment)   Aortic dissection (HCC)   Controlled type 2 diabetes mellitus with hyperglycemia (HCC)   COVID   Acute lower UTI   Acute hypoxemic respiratory failure due to COVID-19 [facility])   Non-ST elevation (NSTEMI) myocardial infarction [facility])   Acute respiratory failure with hypoxia (HCC)   Palliative care by specialist   Goals of care, counseling/discussion   General weakness   Acute hypoxemic respiratory failure due to covid-19 pneumonia and subsequent acute pulmonary edema: SARS-CoV-2 PCR positive on 12/13.  - Completed remdesivir (12/15 - 12/19) - Discontinue steroids 12/15-12/25 - Continue getting OOB, using IS as much as possible -family does not want SNF, limited ambulation at home requesting home health and physical therapy at home if at all possible  Flash pulmonary edema, acute HFrEF due to NSTEMI, Takotsubo cardiomyopathy: Echo with new wall motion abnormalities. - Lasix  40mg  IV BID, good diuresis with improving hypoxia. - Cardiology consulted, pt to remain on anticoagulation. Not a candidate for invasive intervention based on poor risk profile including penetrating thoracic aortic ulceration. Also echocardiogram reportedly most consistent with stress-induced cardiomyopathy. Continue beta blocker, ARB.  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   Hypokalemia: Despite supplementation in face of aggressive diuresis.  - Supplement through today and recheck in AM. Give empiric Mg.   UTI: Sepsis has been ruled out. - s/p ceftriaxone, cultures without growth - 1 of 4 blood cultures grew S. capitis, consistent with contaminant.   Lymphopenia, thrombocytopenia:  - Due to covid, resolved.   AKI: Resolved. Cr relatively stable. - Continue monitoring.   Generalized weakness, poor per oral intake: Suspected sequelae of covid infection. - PT recommends SNF for rehabilitation, though the patient and family decline this and wish to take the patient home.  Malpositioned PICC line: Based on XR 12/19, subsequently pulled.  Poor IV access, difficult stick: s/p new PICC 12/22 double lumen RUE.   Dementia: Stable, may be more accurately MCI.  DVT prophylaxis: Lovenox Code Status: DNR Family Communication: Niece who is caretaker updated at length.  Disposition Plan:  Status is: Inpatient  Remains inpatient appropriate because:Unsafe d/c plan and Inpatient level of care appropriate due to severity of illness  Dispo: The patient is from: Home              Anticipated d/c is to: Home  Anticipated d/c date is: 48-72h              Patient currently is not medically stable to d/c.  Consultants:   Palliative care  Cardiology  Procedures:    None  Antimicrobials:  Ceftriaxone, remdesivir   Subjective: No acute issues or events overnight, patient indicates her breathing is much better, denies chest pain nausea vomiting diarrhea constipation headache fevers or chills.  She is somewhat short tempered this morning while evaluating for possible discharge planning she indicates that she would not go anywhere except home.  Objective: Vitals:   09/11/20 1334 09/11/20 1340 09/11/20 2240 09/12/20 0530  BP: (!) 145/68  (!) 169/90 (!) 169/82  Pulse: 93  86 81  Resp: (!) 22   20  Temp: 98.1 F (36.7 C)  99.5 F (37.5 C) 97.7 F (36.5 C)  TempSrc: Oral  Oral Oral  SpO2: (!) 84% 92% 94% 100%  Weight:    60.7 kg  Height:        Intake/Output Summary (Last 24 hours) at 09/12/2020 0709 Last data filed at 09/11/2020 2300 Gross per 24 hour  Intake 690 ml  Output 1520 ml  Net -830 ml   Filed Weights   09/10/20 0500 09/11/20 0643 09/12/20 0530  Weight: 64.9 kg 61.5 kg 60.7 kg   Gen: 86 y.o. female in no distress Pulm: Bibasilar rales without overt wheeze or rhonchi. CV: Regular rate and rhythm with occasional PAC. No murmur, rub, or gallop. No JVD, no significant dependent edema. GI: Abdomen soft, non-tender, non-distended, with normoactive bowel sounds.  Ext: Warm, no deformities Skin: No new rashes, lesions or ulcers on visualized skin. Neuro: Alert and oriented to person only, moves all extremities but not cooperative with full exam.  Data Reviewed: I have personally reviewed following labs and imaging studies  CBC: Recent Labs  Lab 09/07/20 0310 09/09/20 0515 09/10/20 0409  WBC 10.8* 9.3 8.0  NEUTROABS 8.2* 7.9*  --   HGB 15.4* 13.5 12.2  HCT 47.7* 41.0 36.8  MCV 96.4 94.0 96.1  PLT 242 250 248   Basic Metabolic Panel: Recent Labs  Lab 09/08/20 0326 09/09/20 0515 09/10/20 0409 09/11/20 0356 09/12/20 0417  NA 137 136 138 140 141  K 4.0 3.5 3.4* 2.9* 4.6  CL 104 100 97* 96* 99  CO2 21* 25 31 33* 32   GLUCOSE 175* 215* 168* 103* 130*  BUN 8 12 18 18 12   CREATININE 0.39* 0.58 0.69 0.68 0.69  CALCIUM 7.5* 7.5* 7.4* 7.6* 8.0*  MG  --   --   --   --  2.1  PHOS  --   --   --  2.0* 2.1*   GFR: Estimated Creatinine Clearance: 43.7 mL/min (by C-G formula based on SCr of 0.69 mg/dL). Liver Function Tests: Recent Labs  Lab 09/07/20 0310 09/08/20 0326 09/09/20 0515 09/11/20 0356 09/12/20 0417  AST 30 26 25   --   --   ALT 37 28 25  --   --   ALKPHOS 50 44 43  --   --   BILITOT 1.0 1.3* 0.9  --   --   PROT 5.3* 4.8* 5.1*  --   --   ALBUMIN 2.4* 2.1* 2.2* 2.3* 2.4*   CBG: No results for input(s): GLUCAP in the last 168 hours. Urine analysis:    Component Value Date/Time   COLORURINE AMBER (A) 08/31/2020 2252   APPEARANCEUR CLEAR 08/31/2020 2252   LABSPEC 1.018 08/31/2020 2252   PHURINE  5.0 08/31/2020 2252   GLUCOSEU NEGATIVE 08/31/2020 2252   HGBUR SMALL (A) 08/31/2020 2252   BILIRUBINUR NEGATIVE 08/31/2020 2252   BILIRUBINUR neg 07/18/2014 1320   KETONESUR NEGATIVE 08/31/2020 2252   PROTEINUR 30 (A) 08/31/2020 2252   UROBILINOGEN 0.2 07/18/2014 1320   UROBILINOGEN 0.2 06/14/2014 1236   NITRITE NEGATIVE 08/31/2020 2252   LEUKOCYTESUR MODERATE (A) 08/31/2020 2252   No results found for this or any previous visit (from the past 240 hour(s)).    Radiology Studies: DG Chest 1 View  Result Date: 09/11/2020 CLINICAL DATA:  COVID pneumonia. EXAM: CHEST  1 VIEW COMPARISON:  09/08/2020 FINDINGS: 0902 hours. Cardiopericardial silhouette is at upper limits of normal for size. Basilar predominant, right greater than left airspace disease is similar to prior. No substantial pleural effusion. Right PICC line tip overlies the mid SVC level. Telemetry leads overlie the chest. IMPRESSION: Persistent basilar predominant airspace disease, right greater than left without substantial change. Electronically Signed   By: Kennith Center M.D.   On: 09/11/2020 09:35   Scheduled Meds: . aspirin EC   81 mg Oral Daily  . carvedilol  6.25 mg Oral BID WC  . Chlorhexidine Gluconate Cloth  6 each Topical Daily  . enoxaparin (LOVENOX) injection  60 mg Subcutaneous Q12H  . feeding supplement  237 mL Oral BID BM  . furosemide  40 mg Intravenous BID  . losartan  25 mg Oral Daily  . methylPREDNISolone (SOLU-MEDROL) injection  40 mg Intravenous Daily  . pantoprazole  40 mg Oral Daily  . sodium chloride flush  10-40 mL Intracatheter Q12H   Continuous Infusions:    LOS: 11 days   Time spent: 35 minutes.  Azucena Fallen, DO [facility] [email or web address] 09/12/2020, 7:09 AM

## 2020-09-12 NOTE — Progress Notes (Signed)
Due to the COVID-19 pandemic, this visit was completed with telemedicine (audio/video) technology to reduce patient and provider exposure as well as to preserve personal protective equipment.   Progress Note  Patient Name: Kaitlin Smith Date of Encounter: 09/12/2020  Primary Cardiologist:  Nanetta Batty, MD  Subjective   The patient has been doing better, saturating in 90s on room air only.    Inpatient Medications    Scheduled Meds: . aspirin EC  81 mg Oral Daily  . carvedilol  6.25 mg Oral BID WC  . Chlorhexidine Gluconate Cloth  6 each Topical Daily  . enoxaparin (LOVENOX) injection  60 mg Subcutaneous Q12H  . feeding supplement  237 mL Oral BID BM  . furosemide  40 mg Intravenous BID  . losartan  25 mg Oral Daily  . methylPREDNISolone (SOLU-MEDROL) injection  40 mg Intravenous Daily  . pantoprazole  40 mg Oral Daily  . sodium chloride flush  10-40 mL Intracatheter Q12H   Continuous Infusions:  PRN Meds: acetaminophen **OR** acetaminophen, labetalol, nitroGLYCERIN, ondansetron (ZOFRAN) IV, sodium chloride flush, sodium chloride flush   Vital Signs    Vitals:   09/11/20 1334 09/11/20 1340 09/11/20 2240 09/12/20 0530  BP: (!) 145/68  (!) 169/90 (!) 169/82  Pulse: 93  86 81  Resp: (!) 22   20  Temp: 98.1 F (36.7 C)  99.5 F (37.5 C) 97.7 F (36.5 C)  TempSrc: Oral  Oral Oral  SpO2: (!) 84% 92% 94% 100%  Weight:    60.7 kg  Height:        Intake/Output Summary (Last 24 hours) at 09/12/2020 0916 Last data filed at 09/11/2020 2300 Gross per 24 hour  Intake 490 ml  Output 1520 ml  Net -1030 ml   Filed Weights   09/10/20 0500 09/11/20 0643 09/12/20 0530  Weight: 64.9 kg 61.5 kg 60.7 kg   Last Weight  Most recent update: 09/12/2020  6:51 AM   Weight  60.7 kg (133 lb 14.4 oz)           Weight change: -0.763 kg   Telemetry    SR 70-80' - Personally Reviewed  ECG    None today - Personally Reviewed  Physical Exam   VITAL SIGNS:   reviewed General: Well developed, well nourished, female appearing in no acute distress.   Labs    Hematology Recent Labs  Lab 09/07/20 0310 09/09/20 0515 09/10/20 0409  WBC 10.8* 9.3 8.0  RBC 4.95 4.36 3.83*  HGB 15.4* 13.5 12.2  HCT 47.7* 41.0 36.8  MCV 96.4 94.0 96.1  MCH 31.1 31.0 31.9  MCHC 32.3 32.9 33.2  RDW 13.0 13.2 13.3  PLT 242 250 248    Chemistry Recent Labs  Lab 09/07/20 0310 09/08/20 0326 09/09/20 0515 09/10/20 0409 09/11/20 0356 09/12/20 0417  NA 139 137 136 138 140 141  K 4.0 4.0 3.5 3.4* 2.9* 4.6  CL 104 104 100 97* 96* 99  CO2 24 21* 25 31 33* 32  GLUCOSE 151* 175* 215* 168* 103* 130*  BUN 9 8 12 18 18 12   CREATININE 0.54 0.39* 0.58 0.69 0.68 0.69  CALCIUM 7.9* 7.5* 7.5* 7.4* 7.6* 8.0*  PROT 5.3* 4.8* 5.1*  --   --   --   ALBUMIN 2.4* 2.1* 2.2*  --  2.3* 2.4*  AST 30 26 25   --   --   --   ALT 37 28 25  --   --   --   ALKPHOS 50  44 43  --   --   --   BILITOT 1.0 1.3* 0.9  --   --   --   GFRNONAA >60 >60 >60 >60 >60 >60  ANIONGAP 11 12 11 10 11 10      High Sensitivity Troponin:   Recent Labs  Lab 09/08/20 1800 09/08/20 2125 09/09/20 0507 09/09/20 0826  TROPONINIHS 96* 1,188* 2,788* 2,651*      BNP Recent Labs  Lab 09/08/20 2125  BNP 1,007.4*     DDimer  Recent Labs  Lab 09/06/20 1858 09/07/20 0913 09/08/20 2125  DDIMER >20.00* 1.02* 1.04*     Radiology    DG Chest 1 View  Result Date: 09/11/2020 CLINICAL DATA:  COVID pneumonia.  IMPRESSION: Persistent basilar predominant airspace disease, right greater than left without substantial change. Electronically Signed   By: 09/13/2020 M.D.   On: 09/11/2020 09:35   ECHOCARDIOGRAM LIMITED  Result Date: 09/09/2020 1. There is severe hypokinesis of the mid septum, all apical segments,  and apex. Due to moderate LVH, EF appears to be 30-35% but difficult to  assess. Wall motion abnormalities are new. This pattern can be seen in a  stress-induced cardiomyopathy   (Takotsubo) vs large wrap around LAD infarction. Clinical correlation is  recommended. Left ventricular ejection fraction, by estimation, is 30 to  35%. The left ventricle has moderately decreased function. The left  ventricle demonstrates regional wall  motion abnormalities (see scoring diagram/findings for description). There  is moderate concentric left ventricular hypertrophy. Left ventricular  diastolic parameters are consistent with Grade I diastolic dysfunction  (impaired relaxation).  2. Right ventricular systolic function is normal. The right ventricular  size is normal.  3. The mitral valve is grossly normal. No evidence of mitral valve  regurgitation. No evidence of mitral stenosis.  4. The aortic valve is tricuspid. There is mild calcification of the  aortic valve. There is mild thickening of the aortic valve. Mild to  moderate aortic valve sclerosis/calcification is present, without any  evidence of aortic stenosis.   09/11/2020 EKG SITE RITE  Result Date: 09/08/2020 If Site Rite image not attached, placement could not be confirmed due to current cardiac rhythm.    Cardiac Studies   ECHO:  09/09/2020 1. There is severe hypokinesis of the mid septum, all apical segments, and apex. Due to moderate LVH, EF appears to be 30-35% but difficult to assess. Wall motion abnormalities are new. This pattern can be seen in a stress-induced cardiomyopathy  (Takotsubo) vs large wrap around LAD infarction. Clinical correlation is recommended. Left ventricular ejection fraction, by estimation, is 30 to 35%. The left ventricle has moderately decreased function. The left ventricle demonstrates regional wall motion abnormalities (see scoring diagram/findings for description). There is moderate concentric left ventricular hypertrophy. Left ventricular diastolic parameters are consistent with Grade I diastolic dysfunction  (impaired relaxation).  2. Right ventricular systolic function is normal.  The right ventricular  size is normal.  3. The mitral valve is grossly normal. No evidence of mitral valve  regurgitation. No evidence of mitral stenosis.  4. The aortic valve is tricuspid. There is mild calcification of the  aortic valve. There is mild thickening of the aortic valve. Mild to  moderate aortic valve sclerosis/calcification is present, without any  evidence of aortic stenosis.  5. The inferior vena cava is normal in size with greater than 50%  respiratory variability, suggesting right atrial pressure of 3 mmHg.   Comparison(s): Changes from prior study are noted. EF  35-40% with new WMA.   Patient Profile     86 y.o. 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 Pt developed flash pulm edema, troponin elevated, Cards asked to see.  Assessment & Plan    1. Elevated troponin, newly decreased EF - WMA c/w Takotsubo CM, no cath planned - I have personally reviewed her echocardiogram and it is consistent with a case of Takotsubo (stress induced) cardiomyopathy, the mainstay of therapy are beta-blockers, we increased carvedilol to 6.25 mg p.o. twice daily, I would increase to 12.5 mg po BID today as well as increase losartan to 50 mg po daily.  2. Pulm Edema, CHF - on Lasix 40 mg IV bid, weight is down by 4 kg, however persistent pulmonary edema on CXR, Continue Lasix 40 mg IV twice daily, Crea is stable and normal - however, oxygenation is improving, she is only on room air and saturating in 90s now - continue diuresis -We will replace potassium 40   3. Palliative Care - they are seeing, establishing goals of care - pt is  DNR  Otherwise, per IM Principal Problem:   COVID-19 virus infection Active Problems:   Essential hypertension   MCI (mild cognitive impairment)   Aortic dissection (HCC)   Controlled type 2 diabetes mellitus with hyperglycemia (HCC)   COVID   Acute lower UTI   Acute hypoxemic respiratory failure due to COVID-19 [facility])   Non-ST elevation (NSTEMI) myocardial infarction [facility])   Acute respiratory failure with hypoxia [facility])   Palliative care by specialist   Goals of care, counseling/discussion   General weakness  Tobias Alexander, MD 09/12/2020

## 2020-09-13 DIAGNOSIS — I5043 Acute on chronic combined systolic (congestive) and diastolic (congestive) heart failure: Secondary | ICD-10-CM

## 2020-09-13 DIAGNOSIS — R6520 Severe sepsis without septic shock: Secondary | ICD-10-CM

## 2020-09-13 DIAGNOSIS — A419 Sepsis, unspecified organism: Secondary | ICD-10-CM

## 2020-09-13 LAB — CBC
HCT: 43.5 % (ref 36.0–46.0)
Hemoglobin: 14.5 g/dL (ref 12.0–15.0)
MCH: 31.7 pg (ref 26.0–34.0)
MCHC: 33.3 g/dL (ref 30.0–36.0)
MCV: 95.2 fL (ref 80.0–100.0)
Platelets: 283 10*3/uL (ref 150–400)
RBC: 4.57 MIL/uL (ref 3.87–5.11)
RDW: 13.7 % (ref 11.5–15.5)
WBC: 11.1 10*3/uL — ABNORMAL HIGH (ref 4.0–10.5)
nRBC: 0.2 % (ref 0.0–0.2)

## 2020-09-13 MED ORDER — ASPIRIN 81 MG PO TBEC: 81.0000 mg | DELAYED_RELEASE_TABLET | Freq: Every day | ORAL | 11 refills | Status: DC

## 2020-09-13 MED ORDER — CARVEDILOL 12.5 MG PO TABS: 12.5000 mg | ORAL_TABLET | Freq: Two times a day (BID) | ORAL | 0 refills | Status: DC

## 2020-09-13 MED ORDER — LOSARTAN POTASSIUM 50 MG PO TABS: 50.0000 mg | ORAL_TABLET | Freq: Every day | ORAL | 0 refills | Status: DC

## 2020-09-13 NOTE — TOC Progression Note (Signed)
Transition of Care [facility]) - Progression Note    Patient Details  Name: Kori Goins MRN: [id number] Date of Birth: May 23, 1930  Transition of Care [facility]) CM/SW Contact  Armanda Heritage, RN Phone Number: 09/13/2020, 12:11 PM  Clinical Narrative:    CM spoke with niece [name] regarding dc planning.  Niece reports that she has also been seicj with Covid and is recovering but is using oxygen and feels that she needs an aide for the patient to assist with care.  CM discussed the pcs aide services are only paid for my medicaid not patient's medicare.  Patient at this time does not have medicaid and CM discussed with niece the application process at DSS and encouraged her to work with DSS and apply for medicaid and if patient is approved can then apply for pcs services.  CM discussed HH services with niece, however [name] shares that patient at baseline is dependent for adl's and [facility] therapy services will not result in any improvement and niece feels they would be unnecessary.  [name] reports patient has walk in tub, rolling walker, and 3in1 at home, requests wheelchair (per niece this can be shipped to the home).  [employer] contacted for  dme wheelchair to be shipped to home.  Patient will need [facility] transport at dc.       Expected Discharge Plan: Home w Home Health Services Barriers to Discharge: Barriers Resolved  Expected Discharge Plan and Services Expected Discharge Plan: Home w Home Health Services   Discharge Planning Services: CM Consult     Expected Discharge Date: 09/13/20               DME Arranged: Wheelchair manual DME Agency: [employer] Date DME Agency Contacted: 09/13/20 Time DME Agency Contacted: 1211 Representative spoke with at DME Agency: Lucrecia             Social Determinants of Health (SDOH) Interventions    Readmission Risk Interventions No flowsheet data found.

## 2020-09-13 NOTE — Plan of Care (Signed)
  Problem: Nutrition: Goal: Adequate nutrition will be maintained Outcome: Progressing   Problem: Education: Goal: Knowledge of General Education information will improve Description: Including pain rating scale, medication(s)/side effects and non-pharmacologic comfort measures 09/13/2020 1808 by Mariann Laster, RN Outcome: Adequate for Discharge 09/13/2020 1151 by Mariann Laster, RN Outcome: Adequate for Discharge   Problem: Health Behavior/Discharge Planning: Goal: Ability to manage health-related needs will improve Outcome: Adequate for Discharge   Problem: Clinical Measurements: Goal: Ability to maintain clinical measurements within normal limits will improve 09/13/2020 1808 by Mariann Laster, RN Outcome: Adequate for Discharge 09/13/2020 1151 by Mariann Laster, RN Outcome: Adequate for Discharge Goal: Will remain free from infection Outcome: Adequate for Discharge Goal: Diagnostic test results will improve 09/13/2020 1808 by Mariann Laster, RN Outcome: Adequate for Discharge 09/13/2020 1151 by Mariann Laster, RN Outcome: Adequate for Discharge Goal: Respiratory complications will improve 09/13/2020 1808 by Mariann Laster, RN Outcome: Adequate for Discharge 09/13/2020 1151 by Mariann Laster, RN Outcome: Adequate for Discharge Goal: Cardiovascular complication will be avoided Outcome: Adequate for Discharge   Problem: Activity: Goal: Risk for activity intolerance will decrease Outcome: Adequate for Discharge   Problem: Nutrition: Goal: Adequate nutrition will be maintained 09/13/2020 1808 by Mariann Laster, RN Outcome: Adequate for Discharge 09/13/2020 1151 by Mariann Laster, RN Outcome: Progressing   Problem: Coping: Goal: Level of anxiety will decrease 09/13/2020 1808 by Mariann Laster, RN Outcome: Adequate for Discharge 09/13/2020 1151 by Mariann Laster, RN Outcome: Adequate for Discharge   Problem: Elimination: Goal: Will not experience  complications related to bowel motility Outcome: Adequate for Discharge Goal: Will not experience complications related to urinary retention Outcome: Adequate for Discharge   Problem: Pain Managment: Goal: General experience of comfort will improve Outcome: Adequate for Discharge   Problem: Safety: Goal: Ability to remain free from injury will improve Outcome: Adequate for Discharge   Problem: Skin Integrity: Goal: Risk for impaired skin integrity will decrease Outcome: Adequate for Discharge   Problem: Education: Goal: Knowledge of risk factors and measures for prevention of condition will improve Outcome: Adequate for Discharge   Problem: Coping: Goal: Psychosocial and spiritual needs will be supported Outcome: Adequate for Discharge   Problem: Respiratory: Goal: Will maintain a patent airway Outcome: Adequate for Discharge Goal: Complications related to the disease process, condition or treatment will be avoided or minimized Outcome: Adequate for Discharge   Problem: Education: Goal: Knowledge of risk factors and measures for prevention of condition will improve Outcome: Adequate for Discharge   Problem: Coping: Goal: Psychosocial and spiritual needs will be supported Outcome: Adequate for Discharge   Problem: Respiratory: Goal: Will maintain a patent airway Outcome: Adequate for Discharge Goal: Complications related to the disease process, condition or treatment will be avoided or minimized Outcome: Adequate for Discharge

## 2020-09-13 NOTE — Plan of Care (Signed)
  Problem: Nutrition: Goal: Adequate nutrition will be maintained Outcome: Progressing   Problem: Education: Goal: Knowledge of General Education information will improve Description: Including pain rating scale, medication(s)/side effects and non-pharmacologic comfort measures Outcome: Adequate for Discharge   Problem: Clinical Measurements: Goal: Ability to maintain clinical measurements within normal limits will improve Outcome: Adequate for Discharge Goal: Diagnostic test results will improve Outcome: Adequate for Discharge Goal: Respiratory complications will improve Outcome: Adequate for Discharge   Problem: Coping: Goal: Level of anxiety will decrease Outcome: Adequate for Discharge

## 2020-09-13 NOTE — Progress Notes (Signed)
Due to the COVID-19 pandemic, this visit was completed with telemedicine (audio/video) technology to reduce patient and provider exposure as well as to preserve personal protective equipment.   Progress Note  Patient Name: Kaitlin Smith Date of Encounter: 09/13/2020  Primary Cardiologist:  Nanetta Batty, MD  Subjective   The patient doesn't like to keep nasal cannula on, her O2 sats drop to 53' without it on room air, now in 16' on 5L via [place].  Inpatient Medications    Scheduled Meds: . aspirin EC  81 mg Oral Daily  . carvedilol  12.5 mg Oral BID WC  . Chlorhexidine Gluconate Cloth  6 each Topical Daily  . enoxaparin (LOVENOX) injection  60 mg Subcutaneous Q12H  . feeding supplement  237 mL Oral BID BM  . furosemide  40 mg Intravenous BID  . losartan  50 mg Oral Daily  . pantoprazole  40 mg Oral Daily  . sodium chloride flush  10-40 mL Intracatheter Q12H   Continuous Infusions:  PRN Meds: acetaminophen **OR** acetaminophen, labetalol, nitroGLYCERIN, ondansetron (ZOFRAN) IV, sodium chloride flush, sodium chloride flush   Vital Signs    Vitals:   09/12/20 1220 09/12/20 2113 09/13/20 0500 09/13/20 0615  BP: 131/70 136/73  131/65  Pulse: 75 84  81  Resp: (!) 23 (!) 22  18  Temp: 98.6 F (37 C) 98.7 F (37.1 C)  98.4 F (36.9 C)  TempSrc: Axillary Oral  Oral  SpO2:  90%  97%  Weight:   60.5 kg   Height:        Intake/Output Summary (Last 24 hours) at 09/13/2020 0826 Last data filed at 09/13/2020 0600 Gross per 24 hour  Intake 720 ml  Output 2000 ml  Net -1280 ml   Filed Weights   09/11/20 0643 09/12/20 0530 09/13/20 0500  Weight: 61.5 kg 60.7 kg 60.5 kg   Last Weight  Most recent update: 09/13/2020  6:40 AM   Weight  60.5 kg (133 lb 6.1 oz)           Weight change: -0.237 kg   Telemetry    SR 70-80' - Personally Reviewed  ECG    None today - Personally Reviewed  Physical Exam   VITAL SIGNS:  reviewed General: Well developed, well nourished,  female appearing in no acute distress.   Labs    Hematology Recent Labs  Lab 09/09/20 0515 09/10/20 0409 09/13/20 0405  WBC 9.3 8.0 11.1*  RBC 4.36 3.83* 4.57  HGB 13.5 12.2 14.5  HCT 41.0 36.8 43.5  MCV 94.0 96.1 95.2  MCH 31.0 31.9 31.7  MCHC 32.9 33.2 33.3  RDW 13.2 13.3 13.7  PLT 250 248 283    Chemistry Recent Labs  Lab 09/07/20 0310 09/08/20 0326 09/09/20 0515 09/10/20 0409 09/11/20 0356 09/12/20 0417  NA 139 137 136 138 140 141  K 4.0 4.0 3.5 3.4* 2.9* 4.6  CL 104 104 100 97* 96* 99  CO2 24 21* 25 31 33* 32  GLUCOSE 151* 175* 215* 168* 103* 130*  BUN 9 8 12 18 18 12   CREATININE 0.54 0.39* 0.58 0.69 0.68 0.69  CALCIUM 7.9* 7.5* 7.5* 7.4* 7.6* 8.0*  PROT 5.3* 4.8* 5.1*  --   --   --   ALBUMIN 2.4* 2.1* 2.2*  --  2.3* 2.4*  AST 30 26 25   --   --   --   ALT 37 28 25  --   --   --   ALKPHOS 50 44  43  --   --   --   BILITOT 1.0 1.3* 0.9  --   --   --   GFRNONAA >60 >60 >60 >60 >60 >60  ANIONGAP 11 12 11 10 11 10      High Sensitivity Troponin:   Recent Labs  Lab 09/08/20 1800 09/08/20 2125 09/09/20 0507 09/09/20 0826  TROPONINIHS 96* 1,188* 2,788* 2,651*      BNP Recent Labs  Lab 09/08/20 2125  BNP 1,007.4*     DDimer  Recent Labs  Lab 09/06/20 1858 09/07/20 0913 09/08/20 2125  DDIMER >20.00* 1.02* 1.04*     Radiology    DG Chest 1 View  Result Date: 09/11/2020 CLINICAL DATA:  COVID pneumonia.  IMPRESSION: Persistent basilar predominant airspace disease, right greater than left without substantial change. Electronically Signed   By: 09/13/2020 M.D.   On: 09/11/2020 09:35   ECHOCARDIOGRAM LIMITED  Result Date: 09/09/2020 1. There is severe hypokinesis of the mid septum, all apical segments,  and apex. Due to moderate LVH, EF appears to be 30-35% but difficult to  assess. Wall motion abnormalities are new. This pattern can be seen in a  stress-induced cardiomyopathy  (Takotsubo) vs large wrap around LAD infarction. Clinical  correlation is  recommended. Left ventricular ejection fraction, by estimation, is 30 to  35%. The left ventricle has moderately decreased function. The left  ventricle demonstrates regional wall  motion abnormalities (see scoring diagram/findings for description). There  is moderate concentric left ventricular hypertrophy. Left ventricular  diastolic parameters are consistent with Grade I diastolic dysfunction  (impaired relaxation).  2. Right ventricular systolic function is normal. The right ventricular  size is normal.  3. The mitral valve is grossly normal. No evidence of mitral valve  regurgitation. No evidence of mitral stenosis.  4. The aortic valve is tricuspid. There is mild calcification of the  aortic valve. There is mild thickening of the aortic valve. Mild to  moderate aortic valve sclerosis/calcification is present, without any  evidence of aortic stenosis.   09/11/2020 EKG SITE RITE  Result Date: 09/08/2020 If Site Rite image not attached, placement could not be confirmed due to current cardiac rhythm.    Cardiac Studies   ECHO:  09/09/2020 1. There is severe hypokinesis of the mid septum, all apical segments, and apex. Due to moderate LVH, EF appears to be 30-35% but difficult to assess. Wall motion abnormalities are new. This pattern can be seen in a stress-induced cardiomyopathy  (Takotsubo) vs large wrap around LAD infarction. Clinical correlation is recommended. Left ventricular ejection fraction, by estimation, is 30 to 35%. The left ventricle has moderately decreased function. The left ventricle demonstrates regional wall motion abnormalities (see scoring diagram/findings for description). There is moderate concentric left ventricular hypertrophy. Left ventricular diastolic parameters are consistent with Grade I diastolic dysfunction  (impaired relaxation).  2. Right ventricular systolic function is normal. The right ventricular  size is normal.  3. The mitral  valve is grossly normal. No evidence of mitral valve  regurgitation. No evidence of mitral stenosis.  4. The aortic valve is tricuspid. There is mild calcification of the  aortic valve. There is mild thickening of the aortic valve. Mild to  moderate aortic valve sclerosis/calcification is present, without any  evidence of aortic stenosis.  5. The inferior vena cava is normal in size with greater than 50%  respiratory variability, suggesting right atrial pressure of 3 mmHg.   Comparison(s): Changes from prior study are noted. EF 35-40%  with new WMA.   Patient Profile     86 y.o. 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 Pt developed flash pulm edema, troponin elevated, Cards asked to see.  Assessment & Plan    Principal Problem:   COVID-19 virus infection Active Problems:   Essential hypertension   MCI (mild cognitive impairment)   Aortic dissection (HCC)   Controlled type 2 diabetes mellitus with hyperglycemia (HCC)   COVID   Acute lower UTI   Acute hypoxemic respiratory failure due to COVID-19 [facility])   Non-ST elevation (NSTEMI) myocardial infarction (HCC)   Acute respiratory failure with hypoxia (HCC)   Palliative care by specialist   Goals of care, counseling/discussion   General weakness  1. Elevated troponin, newly decreased EF - WMA c/w Takotsubo CM, no cath planned - I have personally reviewed her echocardiogram and it is consistent with a case of Takotsubo (stress induced) cardiomyopathy, the mainstay of therapy are beta-blockers, we uptitrated to 12.5 mg po BID today as well as losartan to 50 mg po daily that she is tolerating  well.  2. Pulm Edema, CHF - on Lasix 40 mg IV bid, weight is down by 4 kg, however persistent pulmonary edema on CXR, - - Continue Lasix 40 mg IV twice daily, Crea is stable and normal  3. Palliative Care - they are seeing, establishing goals of care - pt is DNR  Tobias Alexander, MD 09/13/2020

## 2020-09-13 NOTE — Discharge Summary (Signed)
Physician Discharge Summary  Kaitlin DikeLannie Smith ONG:[id number]:[id number] DOB: 1929/10/24 DOA: 08/31/2020  PCP: Alysia PennaHolwerda, Scott, MD  Admit date: 08/31/2020 Discharge date: 09/13/2020  Admitted From: Home Disposition: Home  Recommendations for Outpatient Follow-up:  1. Follow up with PCP in 1-2 weeks 2. Follow-up with cardiology as scheduled  Home Health: Patient denied Equipment/Devices: Wheelchair  Discharge Condition: Guarded CODE STATUS: DNR Diet recommendation: As tolerated  Brief/Interim Summary: Kaitlin Smith an86 y.o.femalewith a history of dementia, T2DM, HTN, CVA, PVD, AAA who presented to the ED 12/13 with generalized weakness. In the ED she was febrile with elevated lactic acid improved with fluids. CT head nonacute. Urinalysis suggested UTI for which ceftriaxone was given. SARS-CoV-2 PCR was positive, though CXR was without infiltrates and patient without respiratory symptoms/signs or hypoxia. She was admitted, found to have acute DVT of the right popliteal vein for which anticoagulation was started. On 12/21 the patient experienced abrupt onset chest pain and dyspnea with worsening hypoxia due to flash pulmonary edema due to NSTEMI/takotsubo cardiomyopathy. Cardiology was consulted and medical management including diuresis is recommended.  At this time patient is no longer hypoxic, appears much more euvolemic, lengthy discussion daily with patient and family about possible need for SNF placement and rehab.  Unfortunately patient and family indicate no interest in transitioning to rehab, patient declines any further PT, evaluation or work-up.  Patient is on room air today at rest, again declining to walk or ambulate with therapy or staff.  At this time lengthy discussion with family and have agreed to discharge home with wheelchair as patient is not hypoxic at rest will not discharge home on oxygen given she is currently ambulating.  Continue to further recommend follow-up with PCP and  palliative care given patient's advanced age and current condition.  She is otherwise stable and agreeable discharge home.Marland Kitchen.  Discharge Diagnoses:  Principal Problem:   COVID-19 virus infection Active Problems:   Essential hypertension   MCI (mild cognitive impairment)   Aortic dissection (HCC)   Controlled type 2 diabetes mellitus with hyperglycemia (HCC)   COVID   Acute lower UTI   Acute hypoxemic respiratory failure due to COVID-19 [facility](HCC)   Non-ST elevation (NSTEMI) myocardial infarction (HCC)   Acute respiratory failure with hypoxia (HCC)   Palliative care by specialist   Goals of care, counseling/discussion   General weakness   Acute on chronic combined systolic and diastolic CHF (congestive heart failure) (HCC)   Severe sepsis [facility](HCC)    Discharge Instructions  Discharge Instructions    Call MD for:  difficulty breathing, headache or visual disturbances   Complete by: As directed    Diet - low sodium heart healthy   Complete by: As directed    Discharge instructions   Complete by: As directed    ?   Person Under Monitoring Name: Kaitlin DikeLannie Bair  Location: [address] [place] [id number]   Infection Prevention Recommendations for Individuals Confirmed to have, or Being Evaluated for, 2019 Novel Coronavirus (COVID-19) Infection Who Receive Care at Home  Individuals who are confirmed to have, or are being evaluated for, COVID-19 should follow the prevention steps below until a healthcare provider or local or state health department says they can return to normal activities.  Stay home except to get medical care You should restrict activities outside your home, except for getting medical care. Do not go to work, school, or public areas, and do not use public transportation or taxis.  Call ahead before visiting your doctor Before your medical appointment,  call the healthcare provider and tell them that you have, or are being evaluated for, COVID-19  infection. This will help the healthcare provider's office take steps to keep other people from getting infected. Ask your healthcare provider to call the local or state health department.  Monitor your symptoms Seek prompt medical attention if your illness is worsening (e.g., difficulty breathing). Before going to your medical appointment, call the healthcare provider and tell them that you have, or are being evaluated for, COVID-19 infection. Ask your healthcare provider to call the local or state health department.  Wear a facemask You should wear a facemask that covers your nose and mouth when you are in the same room with other people and when you visit a healthcare provider. People who live with or visit you should also wear a facemask while they are in the same room with you.  Separate yourself from other people in your home As much as possible, you should stay in a different room from other people in your home. Also, you should use a separate bathroom, if available.  Avoid sharing household items You should not share dishes, drinking glasses, cups, eating utensils, towels, bedding, or other items with other people in your home. After using these items, you should wash them thoroughly with soap and water.  Cover your coughs and sneezes Cover your mouth and nose with a tissue when you cough or sneeze, or you can cough or sneeze into your sleeve. Throw used tissues in a lined trash can, and immediately wash your hands with soap and water for at least 20 seconds or use an alcohol-based hand rub.  Wash your [employer] your hands often and thoroughly with soap and water for at least 20 seconds. You can use an alcohol-based hand sanitizer if soap and water are not available and if your hands are not visibly dirty. Avoid touching your eyes, nose, and mouth with unwashed hands.   Prevention Steps for Caregivers and Household Members of Individuals Confirmed to have, or Being Evaluated  for, COVID-19 Infection Being Cared for in the Home  If you live with, or provide care at home for, a person confirmed to have, or being evaluated for, COVID-19 infection please follow these guidelines to prevent infection:  Follow healthcare provider's instructions Make sure that you understand and can help the patient follow any healthcare provider instructions for all care.  Provide for the patient's basic needs You should help the patient with basic needs in the home and provide support for getting groceries, prescriptions, and other personal needs.  Monitor the patient's symptoms If they are getting sicker, call his or her medical provider and tell them that the patient has, or is being evaluated for, COVID-19 infection. This will help the healthcare provider's office take steps to keep other people from getting infected. Ask the healthcare provider to call the local or state health department.  Limit the number of people who have contact with the patient If possible, have only one caregiver for the patient. Other household members should stay in another home or place of residence. If this is not possible, they should stay in another room, or be separated from the patient as much as possible. Use a separate bathroom, if available. Restrict visitors who do not have an essential need to be in the home.  Keep older adults, very young children, and other sick people away from the patient Keep older adults, very young children, and those who have compromised immune systems or  chronic health conditions away from the patient. This includes people with chronic heart, lung, or kidney conditions, diabetes, and cancer.  Ensure good ventilation Make sure that shared spaces in the home have good air flow, such as from an air conditioner or an opened window, weather permitting.  Wash your hands often Wash your hands often and thoroughly with soap and water for at least 20 seconds. You can use  an alcohol based hand sanitizer if soap and water are not available and if your hands are not visibly dirty. Avoid touching your eyes, nose, and mouth with unwashed hands. Use disposable paper towels to dry your hands. If not available, use dedicated cloth towels and replace them when they become wet.  Wear a facemask and gloves Wear a disposable facemask at all times in the room and gloves when you touch or have contact with the patient's blood, body fluids, and/or secretions or excretions, such as sweat, saliva, sputum, nasal mucus, vomit, urine, or feces.  Ensure the mask fits over your nose and mouth tightly, and do not touch it during use. Throw out disposable facemasks and gloves after using them. Do not reuse. Wash your hands immediately after removing your facemask and gloves. If your personal clothing becomes contaminated, carefully remove clothing and launder. Wash your hands after handling contaminated clothing. Place all used disposable facemasks, gloves, and other waste in a lined container before disposing them with other household waste. Remove gloves and wash your hands immediately after handling these items.  Do not share dishes, glasses, or other household items with the patient Avoid sharing household items. You should not share dishes, drinking glasses, cups, eating utensils, towels, bedding, or other items with a patient who is confirmed to have, or being evaluated for, COVID-19 infection. After the person uses these items, you should wash them thoroughly with soap and water.  Wash laundry thoroughly Immediately remove and wash clothes or bedding that have blood, body fluids, and/or secretions or excretions, such as sweat, saliva, sputum, nasal mucus, vomit, urine, or feces, on them. Wear gloves when handling laundry from the patient. Read and follow directions on labels of laundry or clothing items and detergent. In general, wash and dry with the warmest temperatures  recommended on the label.  Clean all areas the individual has used often Clean all touchable surfaces, such as counters, tabletops, doorknobs, bathroom fixtures, toilets, phones, keyboards, tablets, and bedside tables, every day. Also, clean any surfaces that may have blood, body fluids, and/or secretions or excretions on them. Wear gloves when cleaning surfaces the patient has come in contact with. Use a diluted bleach solution (e.g., dilute bleach with 1 part bleach and 10 parts water) or a household disinfectant with a label that says EPA-registered for coronaviruses. To make a bleach solution at home, add 1 tablespoon of bleach to 1 quart (4 cups) of water. For a larger supply, add  cup of bleach to 1 gallon (16 cups) of water. Read labels of cleaning products and follow recommendations provided on product labels. Labels contain instructions for safe and effective use of the cleaning product including precautions you should take when applying the product, such as wearing gloves or eye protection and making sure you have good ventilation during use of the product. Remove gloves and wash hands immediately after cleaning.  Monitor yourself for signs and symptoms of illness Caregivers and household members are considered close contacts, should monitor their health, and will be asked to limit movement outside of the home to  the extent possible. Follow the monitoring steps for close contacts listed on the symptom monitoring form.   ? If you have additional questions, contact your local health department or call the epidemiologist on call at [phone] (available 24/7). ? This guidance is subject to change. For the most up-to-date guidance from CDC, please refer to their website: [employer]Met[employer].hu   Increase activity slowly   Complete by: As directed      Allergies as of 09/13/2020      Reactions   Clonidine Derivatives Other (See  Comments)   Pass out; makes her blood pressure too low   Milk-related Compounds Other (See Comments)   Lactose intolerant.        Medication List    STOP taking these medications   amLODipine 10 MG tablet Commonly known as: NORVASC   cloNIDine 0.2 MG tablet Commonly known as: CATAPRES   clopidogrel 75 MG tablet Commonly known as: PLAVIX   ezetimibe 10 MG tablet Commonly known as: ZETIA   freestyle lancets   FREESTYLE LITE test strip Generic drug: glucose blood   gabapentin 300 MG capsule Commonly known as: NEURONTIN   hydrALAZINE 25 MG tablet Commonly known as: APRESOLINE   insulin glargine 100 UNIT/ML injection Commonly known as: Lantus   insulin lispro 100 UNIT/ML injection Commonly known as: HumaLOG   Insulin Syringe-Needle U-100 31G X 15/64" 0.5 ML Misc Commonly known as: BD Insulin Syringe Ultrafine   isosorbide mononitrate 30 MG 24 hr tablet Commonly known as: Imdur   nitrofurantoin (macrocrystal-monohydrate) 100 MG capsule Commonly known as: MACROBID   simvastatin 20 MG tablet Commonly known as: ZOCOR     TAKE these medications   acetaminophen 325 MG tablet Commonly known as: TYLENOL Take 2 tablets (650 mg total) by mouth every 6 (six) hours as needed for mild pain (temp > 101.5).   aspirin 81 MG EC tablet Take 1 tablet (81 mg total) by mouth daily. Swallow whole. Start taking on: September 14, 2020   calcium carbonate 1500 (600 Ca) MG Tabs tablet Commonly known as: OSCAL Take 1,500 mg by mouth daily with breakfast.   carvedilol 12.5 MG tablet Commonly known as: COREG Take 1 tablet (12.5 mg total) by mouth 2 (two) times daily with a meal.   ketoconazole 2 % cream Commonly known as: NIZORAL Apply 1 application topically daily. Apply to both feet and between toes once daily for 6 weeks   loratadine 10 MG tablet Commonly known as: CLARITIN Take 10 mg by mouth daily.   losartan 50 MG tablet Commonly known as: COZAAR Take 1 tablet (50 mg  total) by mouth daily. Start taking on: September 14, 2020   Lumigan 0.01 % Soln Generic drug: bimatoprost Place 1 drop into both eyes at bedtime.   pantoprazole 40 MG tablet Commonly known as: PROTONIX TAKE 1 TABLET BY MOUTH EVERY DAY   polyethylene glycol powder 17 GM/SCOOP powder Commonly known as: GLYCOLAX/MIRALAX Take 17 g by mouth daily.   Simbrinza 1-0.2 % Susp Generic drug: Brinzolamide-Brimonidine   tamsulosin 0.4 MG Caps capsule Commonly known as: FLOMAX tamsulosin 0.4 mg capsule   Vitamin D (Ergocalciferol) 1.25 MG (50000 UNIT) Caps capsule Commonly known as: DRISDOL Take 50,000 Units by mouth once a week.   Voltaren 1 % Gel Generic drug: diclofenac Sodium APPLY 4 GRAMS TOPICALLY 2 TIMES DAILY AS NEEDED.            Durable Medical Equipment  (From admission, onward)         Start  Ordered   09/13/20 1147  DME standard manual wheelchair with seat cushion  Once       Comments: Patient suffers from ambulatory dysfunction/weakness/debility which impairs her ability to perform daily activities like ambulating, transitioning, cleaning, grooming in the home.  A cane, crutch, or walker will not resolve issue with performing activities of daily living. A wheelchair will allow patient to safely perform daily activities. Patient can safely propel the wheelchair in the home or has a caregiver who can provide assistance. Length of need Lifetime. Accessories: elevating leg rests (ELRs), wheel locks, extensions and anti-tippers.   09/13/20 1147          Allergies  Allergen Reactions  . Clonidine Derivatives Other (See Comments)    Pass out; makes her blood pressure too low  . Milk-Related Compounds Other (See Comments)    Lactose intolerant.      Consultations:  Cardiology, palliative care   Procedures/Studies: DG Chest 1 View  Result Date: 09/11/2020 CLINICAL DATA:  COVID pneumonia. EXAM: CHEST  1 VIEW COMPARISON:  09/08/2020 FINDINGS: 0902 hours.  Cardiopericardial silhouette is at upper limits of normal for size. Basilar predominant, right greater than left airspace disease is similar to prior. No substantial pleural effusion. Right PICC line tip overlies the mid SVC level. Telemetry leads overlie the chest. IMPRESSION: Persistent basilar predominant airspace disease, right greater than left without substantial change. Electronically Signed   By: Kennith Center M.D.   On: 09/11/2020 09:35   CT Head Wo Contrast  Result Date: 08/31/2020 CLINICAL DATA:  Altered mental status. EXAM: CT HEAD WITHOUT CONTRAST TECHNIQUE: Contiguous axial images were obtained from the base of the skull through the vertex without intravenous contrast. COMPARISON:  None. FINDINGS: Brain: Age related atrophy. Remote right frontal and periventricular infarct with ex vacuo dilatation of the right lateral ventricle. Small remote right occipital infarct. Moderate periventricular and deep white matter hypodensity typical of chronic small vessel ischemia. No hemorrhage or evidence of acute ischemia. No subdural or extra-axial collection. No midline shift or mass effect. Vascular: Atherosclerosis of skullbase vasculature without hyperdense vessel or abnormal calcification. Skull: No fracture or focal lesion. Sinuses/Orbits: Paranasal sinuses and mastoid air cells are clear. The visualized orbits are unremarkable. Right cataract resection. Other: None. IMPRESSION: 1. No acute intracranial abnormality. 2. Age related atrophy and chronic small vessel ischemia. Remote right frontal and periventricular infarcts. Small remote right occipital infarct. Electronically Signed   By: Narda Rutherford M.D.   On: 08/31/2020 21:17   DG CHEST PORT 1 VIEW  Result Date: 09/08/2020 CLINICAL DATA:  Worsening shortness of breath EXAM: PORTABLE CHEST 1 VIEW COMPARISON:  09/08/2020 at 7:32 a.m. FINDINGS: Single frontal view of the chest demonstrates stable enlargement the cardiac silhouette. Continued  ectasia of the thoracic aorta and atherosclerosis unchanged. Bibasilar veiling opacities are again noted, with stable interstitial and ground-glass opacities. Overall, findings favor congestive heart failure. No pneumothorax. No acute bony abnormalities. IMPRESSION: 1. Stable findings of congestive heart failure. Electronically Signed   By: Sharlet Salina M.D.   On: 09/08/2020 15:07   DG CHEST PORT 1 VIEW  Result Date: 09/08/2020 CLINICAL DATA:  Acute hypoxic respiratory failure due to COVID EXAM: PORTABLE CHEST 1 VIEW COMPARISON:  Two days ago FINDINGS: Cardiomegaly and vascular pedicle widening accentuated by rightward rotation. Hazy appearance of the bilateral chest which appears similar to before. No visible effusion or pneumothorax. The right-sided PICC is no longer seen. IMPRESSION: Stable bilateral pulmonary infiltrate Electronically Signed   By: Marja Kays  Watts M.D.   On: 09/08/2020 07:56   DG CHEST PORT 1 VIEW  Result Date: 09/06/2020 CLINICAL DATA:  PICC line placement. EXAM: PORTABLE CHEST 1 VIEW COMPARISON:  Chest radiograph 08/31/2020 FINDINGS: Interval insertion right upper extremity PICC line. The tip appears malpositioned as it appears to course leftward likely within the central left brachiocephalic vein, recommend retraction and repeat radiograph. Stable enlarged cardiac and mediastinal contours. Aortic atherosclerosis. Low lung volumes. Increased bilateral interstitial pulmonary opacities. Probable small bilateral pleural effusions. Thoracic spine degenerative changes. IMPRESSION: 1. Interval insertion right upper extremity PICC line. The tip appears malpositioned as it appears to course leftward likely within the central left brachiocephalic vein, recommend retraction and repeat radiograph. 2. Increasing bilateral interstitial opacities which may represent edema. 3. These results will be called to the ordering clinician or representative by the Radiologist Assistant, and communication  documented in the PACS or [employer]. Electronically Signed   By: Annia Belt M.D.   On: 09/06/2020 09:49   DG Chest Port 1 View  Result Date: 08/31/2020 CLINICAL DATA:  Questionable sepsis EXAM: PORTABLE CHEST 1 VIEW COMPARISON:  05/19/2017 FINDINGS: Heart is normal size. Aortic calcifications. No confluent opacities or effusions. No acute bony abnormality. IMPRESSION: No active disease. Electronically Signed   By: Charlett Nose M.D.   On: 08/31/2020 20:31   VAS [place] LOWER EXTREMITY VENOUS (DVT)  Result Date: 09/02/2020  Lower Venous DVT Study Indications: Covid-19, Elevated D-Dimer.  Limitations: Patient did not wish to cooperate with positioning. Refused to separate or rotate legs. Arterial calcification also noted causing shadowing. Comparison Study: No previous exam. Performing Technologist: Clint Guy  Examination Guidelines: A complete evaluation includes B-mode imaging, spectral Doppler, color Doppler, and power Doppler as needed of all accessible portions of each vessel. Bilateral testing is considered an integral part of a complete examination. Limited examinations for reoccurring indications may be performed as noted. The reflux portion of the exam is performed with the patient in reverse Trendelenburg.  +---------+---------------+---------+-----------+----------+--------------+ RIGHT    CompressibilityPhasicitySpontaneityPropertiesThrombus Aging +---------+---------------+---------+-----------+----------+--------------+ CFV      Full           Yes      Yes                                 +---------+---------------+---------+-----------+----------+--------------+ SFJ      Full                                                        +---------+---------------+---------+-----------+----------+--------------+ FV Prox  Full                                                        +---------+---------------+---------+-----------+----------+--------------+ FV Mid   Full                                                         +---------+---------------+---------+-----------+----------+--------------+ FV DistalFull                                                        +---------+---------------+---------+-----------+----------+--------------+  PFV      Full                                                        +---------+---------------+---------+-----------+----------+--------------+ POP      None                                         Acute          +---------+---------------+---------+-----------+----------+--------------+ PTV      Full                                                        +---------+---------------+---------+-----------+----------+--------------+   Right Technical Findings: Not visualized segments include Peroneal vessels not visualized.  +---------+---------------+---------+-----------+----------+--------------+ LEFT     CompressibilityPhasicitySpontaneityPropertiesThrombus Aging +---------+---------------+---------+-----------+----------+--------------+ CFV      Full           Yes      Yes                                 +---------+---------------+---------+-----------+----------+--------------+ SFJ      Full                                                        +---------+---------------+---------+-----------+----------+--------------+ FV Prox  Full                                                        +---------+---------------+---------+-----------+----------+--------------+ FV Mid   Full                                                        +---------+---------------+---------+-----------+----------+--------------+ FV DistalFull                                                        +---------+---------------+---------+-----------+----------+--------------+ PFV      Full                                                         +---------+---------------+---------+-----------+----------+--------------+ PTV      Full                                                        +---------+---------------+---------+-----------+----------+--------------+  Left Technical Findings: Not visualized segments include Popliteal and Peroneal vessels not visualized.   Summary: RIGHT: - Findings consistent with acute deep vein thrombosis involving the right popliteal vein. - No cystic structure found in the popliteal fossa.  LEFT: - There is no evidence of deep vein thrombosis in the lower extremity.  - No cystic structure found in the popliteal fossa.  *See table(s) above for measurements and observations. Electronically signed by Coral Else MD on 09/02/2020 at 10:06:25 PM.    Final    ECHOCARDIOGRAM LIMITED  Result Date: 09/09/2020    ECHOCARDIOGRAM LIMITED REPORT   Patient Name:   DEOLINDA FRID Date of Exam: 09/09/2020 Medical Rec #:  [id number]    Height:       65.0 in Accession #:    [id number]   Weight:       141.5 lb Date of Birth:  [birth year]    BSA:          1.708 m Patient Age:    86 years     BP:           143/72 mmHg Patient Gender: F            HR:           86 bpm. Exam Location:  Inpatient Procedure: Limited Echo, Cardiac Doppler and Color Doppler Indications:    Shortness of breath  History:        Patient has prior history of Echocardiogram examinations, most                 recent 08/17/2018. PAD and Stroke, Signs/Symptoms:Shortness of                 Breath and Pul. edema, Dementia; Risk Factors:Hypertension,                 Diabetes and Dyslipidemia. Covid+.  Sonographer:    Lavenia Atlas Referring Phys: [phone] Tyrone Nine IMPRESSIONS  1. There is severe hypokinesis of the mid septum, all apical segments, and apex. Due to moderate LVH, EF appears to be 30-35% but difficult to assess. Wall motion abnormalities are new. This pattern can be seen in a stress-induced cardiomyopathy (Takotsubo) vs large wrap around LAD  infarction. Clinical correlation is recommended. Left ventricular ejection fraction, by estimation, is 30 to 35%. The left ventricle has moderately decreased function. The left ventricle demonstrates regional wall motion abnormalities (see scoring diagram/findings for description). There is moderate concentric left ventricular hypertrophy. Left ventricular diastolic parameters are consistent with Grade I diastolic dysfunction (impaired relaxation).  2. Right ventricular systolic function is normal. The right ventricular size is normal.  3. The mitral valve is grossly normal. No evidence of mitral valve regurgitation. No evidence of mitral stenosis.  4. The aortic valve is tricuspid. There is mild calcification of the aortic valve. There is mild thickening of the aortic valve. Mild to moderate aortic valve sclerosis/calcification is present, without any evidence of aortic stenosis.  5. The inferior vena cava is normal in size with greater than 50% respiratory variability, suggesting right atrial pressure of 3 mmHg. Comparison(s): Changes from prior study are noted. EF 35-40% with new WMA. FINDINGS  Left Ventricle: There is severe hypokinesis of the mid septum, all apical segments, and apex. Due to moderate LVH, EF appears to be 30-35% but difficult to assess. Wall motion abnormalities are new. This pattern can be seen in a stress-induced cardiomyopathy (Takotsubo) vs large wrap around LAD infarction. Clinical correlation is recommended. Left  ventricular ejection fraction, by estimation, is 30 to 35%. The left ventricle has moderately decreased function. The left ventricle demonstrates regional wall motion abnormalities. The left ventricular internal cavity size was normal in size. There is moderate concentric left ventricular hypertrophy. Left ventricular diastolic parameters are consistent with Grade I diastolic dysfunction (impaired  relaxation).  LV Wall Scoring: The mid and distal anterior septum and entire apex  are hypokinetic. Right Ventricle: The right ventricular size is normal. No increase in right ventricular wall thickness. Right ventricular systolic function is normal. Left Atrium: Left atrial size was normal in size. Right Atrium: Right atrial size was normal in size. Mitral Valve: The mitral valve is grossly normal. No evidence of mitral valve stenosis. Tricuspid Valve: The tricuspid valve is not well visualized. Aortic Valve: The aortic valve is tricuspid. There is mild calcification of the aortic valve. There is mild thickening of the aortic valve. Mild to moderate aortic valve sclerosis/calcification is present, without any evidence of aortic stenosis. Pulmonic Valve: The pulmonic valve was not well visualized. Venous: The inferior vena cava is normal in size with greater than 50% respiratory variability, suggesting right atrial pressure of 3 mmHg. IAS/Shunts: The atrial septum is grossly normal. Additional Comments: There is a small pleural effusion in the left lateral region. LEFT VENTRICLE PLAX 2D LVIDd:         4.00 cm  Diastology LVIDs:         3.50 cm  LV e' medial:    7.07 cm/s LV PW:         1.40 cm  LV E/e' medial:  6.7 LV IVS:        1.60 cm  LV e' lateral:   7.40 cm/s LVOT diam:     2.10 cm  LV E/e' lateral: 6.4 LVOT Area:     3.46 cm  LEFT ATRIUM         Index LA diam:    4.50 cm 2.63 cm/m   AORTA Ao Root diam: 3.40 cm MITRAL VALVE MV Area (PHT): 5.66 cm    SHUNTS MV Decel Time: 134 msec    Systemic Diam: 2.10 cm MV E velocity: 47.50 cm/s MV A velocity: 82.50 cm/s MV E/A ratio:  0.58 Lennie Odor MD Electronically signed by Lennie Odor MD Signature Date/Time: 09/09/2020/12:25:08 PM    Final    [place] EKG SITE RITE  Result Date: 09/08/2020 If Site Rite image not attached, placement could not be confirmed due to current cardiac rhythm.  [place] EKG SITE RITE  Result Date: 09/03/2020 If Site Rite image not attached, placement could not be confirmed due to current cardiac  rhythm.     Subjective: No acute issues or events overnight, patient declines nausea, vomiting, diarrhea, constipation, headache or chills.   Discharge Exam: Vitals:   09/12/20 2113 09/13/20 0615  BP: 136/73 131/65  Pulse: 84 81  Resp: (!) 22 18  Temp: 98.7 F (37.1 C) 98.4 F (36.9 C)  SpO2: 90% 97%   Vitals:   09/12/20 1220 09/12/20 2113 09/13/20 0500 09/13/20 0615  BP: 131/70 136/73  131/65  Pulse: 75 84  81  Resp: (!) 23 (!) 22  18  Temp: 98.6 F (37 C) 98.7 F (37.1 C)  98.4 F (36.9 C)  TempSrc: Axillary Oral  Oral  SpO2:  90%  97%  Weight:   60.5 kg   Height:        General:  Pleasantly resting in bed, No acute distress. HEENT:  Normocephalic atraumatic.  Sclerae nonicteric, noninjected.  Extraocular movements intact bilaterally. Neck:  Without mass or deformity.  Trachea is midline. Lungs:  Clear to auscultate bilaterally without rhonchi, wheeze, or rales. Heart:  Regular rate and rhythm.  Without murmurs, rubs, or gallops. Abdomen:  Soft, nontender, nondistended.  Without guarding or rebound. Extremities: Without cyanosis, clubbing, edema, or obvious deformity. Vascular:  Dorsalis pedis and posterior tibial pulses palpable bilaterally. Skin:  Warm and dry, no erythema, no ulcerations.    The results of significant diagnostics from this hospitalization (including imaging, microbiology, ancillary and laboratory) are listed below for reference.     Microbiology: No results found for this or any previous visit (from the past 240 hour(s)).   Labs: BNP (last 3 results) Recent Labs    09/08/20 2125  BNP 1,007.4*   Basic Metabolic Panel: Recent Labs  Lab 09/08/20 0326 09/09/20 0515 09/10/20 0409 09/11/20 0356 09/12/20 0417  NA 137 136 138 140 141  K 4.0 3.5 3.4* 2.9* 4.6  CL 104 100 97* 96* 99  CO2 21* 25 31 33* 32  GLUCOSE 175* 215* 168* 103* 130*  BUN 8 12 18 18 12   CREATININE 0.39* 0.58 0.69 0.68 0.69  CALCIUM 7.5* 7.5* 7.4* 7.6* 8.0*  MG   --   --   --   --  2.1  PHOS  --   --   --  2.0* 2.1*   Liver Function Tests: Recent Labs  Lab 09/07/20 0310 09/08/20 0326 09/09/20 0515 09/11/20 0356 09/12/20 0417  AST 30 26 25   --   --   ALT 37 28 25  --   --   ALKPHOS 50 44 43  --   --   BILITOT 1.0 1.3* 0.9  --   --   PROT 5.3* 4.8* 5.1*  --   --   ALBUMIN 2.4* 2.1* 2.2* 2.3* 2.4*   No results for input(s): LIPASE, AMYLASE in the last 168 hours. No results for input(s): AMMONIA in the last 168 hours. CBC: Recent Labs  Lab 09/07/20 0310 09/09/20 0515 09/10/20 0409 09/13/20 0405  WBC 10.8* 9.3 8.0 11.1*  NEUTROABS 8.2* 7.9*  --   --   HGB 15.4* 13.5 12.2 14.5  HCT 47.7* 41.0 36.8 43.5  MCV 96.4 94.0 96.1 95.2  PLT 242 250 248 283   Cardiac Enzymes: No results for input(s): CKTOTAL, CKMB, CKMBINDEX, TROPONINI in the last 168 hours. BNP: Invalid input(s): POCBNP CBG: No results for input(s): GLUCAP in the last 168 hours. D-Dimer No results for input(s): DDIMER in the last 72 hours. Hgb A1c No results for input(s): HGBA1C in the last 72 hours. Lipid Profile No results for input(s): CHOL, HDL, LDLCALC, TRIG, CHOLHDL, LDLDIRECT in the last 72 hours. Thyroid function studies No results for input(s): TSH, T4TOTAL, T3FREE, THYROIDAB in the last 72 hours.  Invalid input(s): FREET3 Anemia work up No results for input(s): VITAMINB12, FOLATE, FERRITIN, TIBC, IRON, RETICCTPCT in the last 72 hours. Urinalysis    Component Value Date/Time   COLORURINE AMBER (A) 08/31/2020 2252   APPEARANCEUR CLEAR 08/31/2020 2252   LABSPEC 1.018 08/31/2020 2252   PHURINE 5.0 08/31/2020 2252   GLUCOSEU NEGATIVE 08/31/2020 2252   HGBUR SMALL (A) 08/31/2020 2252   BILIRUBINUR NEGATIVE 08/31/2020 2252   BILIRUBINUR neg 07/18/2014 1320   KETONESUR NEGATIVE 08/31/2020 2252   PROTEINUR 30 (A) 08/31/2020 2252   UROBILINOGEN 0.2 07/18/2014 1320   UROBILINOGEN 0.2 06/14/2014 1236   NITRITE NEGATIVE 08/31/2020 2252   LEUKOCYTESUR  MODERATE (  A) 08/31/2020 2252   Sepsis Labs Invalid input(s): PROCALCITONIN,  WBC,  LACTICIDVEN Microbiology No results found for this or any previous visit (from the past 240 hour(s)).   Time coordinating discharge: Over 30 minutes  SIGNED:   Azucena Fallen, DO [facility] 09/13/2020, 12:58 PM Pager   If 7PM-7AM, please contact night-coverage [email or web address]

## 2020-09-13 NOTE — Progress Notes (Signed)
Daily Progress Note   Patient Name: Addalyne Vandehei       Date: 09/13/2020 DOB: Sep 10, 1930  Age: 86 y.o. MRN#: [id number] Attending Physician: Azucena Fallen, MD Primary Care Physician: Alysia Penna, MD Admit Date: 08/31/2020  Reason for Consultation/Follow-up: Establishing goals of care  Subjective: Awake alert sitting up in bed.  Discussed with bedside RN, patient taking in Ensure and some liquids.  Does better with liquids, efforts are being made to wean down her supplemental oxygen.  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  Patient also on losartan.  Patient also on Lasix.   Length of Stay: 12  Current Medications: Scheduled Meds:   aspirin EC  81 mg Oral Daily   carvedilol  12.5 mg Oral BID WC   Chlorhexidine Gluconate Cloth  6 each Topical Daily   enoxaparin (LOVENOX) injection  60 mg Subcutaneous Q12H   feeding supplement  237 mL Oral BID BM   furosemide  40 mg Intravenous BID   losartan  50 mg Oral Daily   pantoprazole  40 mg Oral Daily   sodium chloride flush  10-40 mL Intracatheter Q12H    Continuous Infusions:   PRN Meds: acetaminophen **OR** acetaminophen, labetalol, nitroGLYCERIN, ondansetron (ZOFRAN) IV, sodium chloride flush, sodium chloride flush  Physical Exam         The above conversation was completed via telephone due to the visitor restrictions during the COVID-19 pandemic. Thorough chart review and discussion with necessary members of the care team was completed as part of assessment. All issues were discussed and addressed but no physical exam was performed. Upon observation  patient is awake alert sitting up in bed, tolerating liquids, does not appear to be in acute distress, does appear with generalized weakness.   Vital Signs: BP 131/65 (BP Location: Left Arm)    Pulse 81    Temp 98.4 F (36.9 C) (Oral)    Resp 18    Ht 5\' 5"  (1.651 m)    Wt 60.5 kg    SpO2 97%    BMI 22.20 kg/m  SpO2: SpO2: 97 % O2 Device: O2 Device: High Flow Nasal Cannula O2 Flow Rate: O2 Flow Rate (L/min): 7 L/min  Intake/output summary:  Intake/Output Summary (Last 24 hours) at 09/13/2020 1021 Last data filed at 09/13/2020 0600 Gross per 24 hour  Intake 480 ml  Output 2000 ml  Net -1520 ml   LBM: Last BM Date: 09/11/20 Baseline Weight: Weight: 62.6 kg Most recent weight: Weight: 60.5 kg       Palliative Assessment/Data:      Patient Active Problem List   Diagnosis Date Noted   Acute on chronic combined systolic and diastolic CHF (congestive heart failure) (HCC)    Severe sepsis (HCC)    Non-ST elevation (NSTEMI) myocardial infarction (HCC)    Acute respiratory failure with hypoxia (HCC)    Palliative care by specialist    Goals of care, counseling/discussion    General weakness    Controlled type 2 diabetes mellitus with hyperglycemia (HCC) 09/01/2020   COVID 09/01/2020   Acute lower UTI 09/01/2020   Acute hypoxemic respiratory failure due to COVID-19 (HCC) 09/01/2020   COVID-19 virus infection 08/31/2020   Aortic dissection (HCC) 08/17/2018   Hypertensive emergency 08/17/2018   Abnormal EKG 01/30/2018   Osteoarthritis of right knee 12/04/2017   MCI (mild cognitive impairment) 06/30/2015   Essential hypertension 08/20/2014   Hyperlipidemia 08/20/2014   Peripheral arterial disease (HCC) 08/20/2014   Carotid artery disease (HCC) 06/06/2014   Uncontrolled diabetes mellitus type 2 without complications 05/13/2014   History of CVA (cerebrovascular accident) 05/13/2014   History of MI (myocardial infarction) 05/13/2014    Palliative Care  Assessment & Plan   Patient Profile:  Euphemia Lingerfelt [id number] y.o.femalewith a history of dementia, T2DM, HTN, CVA, PVD, AAA who presented to the ED 12/13 with generalized weakness. In the ED she was febrile with elevated lactic acid improved with fluids. CT head nonacute. Urinalysis suggested UTI for which ceftriaxone was given. SARS-CoV-2 PCR was positive, though CXR was without infiltrates and patient without respiratory symptoms/signs or hypoxia. She was admitted, found to have acute DVT of the right popliteal vein for which anticoagulation was started. On 12/21 the patient experienced abrupt onset chest pain and dyspnea with worsening hypoxia due to flash pulmonary edema due to NSTEMI/takotsubo cardiomyopathy.  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  Assessment: Generalized weakness Moderate oral intake Functional decline   Recommendations/Plan:  Call placed and discussed with niece [name].  She is thankful for the information she is getting from the hospital.  She tells me she has also corresponded with hospital medicine attending.  She states that her health is improving and she wants the patient to come home towards the end of this hospitalization.  She is accepting of maximized home-based care resources such as nursing and PT.   Code Status:    Code Status Orders  (From admission, onward)         Start     Ordered   09/01/20 0351  Do not attempt resuscitation (DNR)  Continuous       Question Answer Comment  In the event of cardiac or respiratory ARREST Do not call a code blue   In the event of cardiac or respiratory ARREST Do not perform Intubation, CPR, defibrillation or ACLS   In the event of cardiac or respiratory ARREST Use medication by any route, position, wound care, and other measures to relive  pain and suffering. May use oxygen, suction and manual treatment of airway obstruction as needed for  comfort.      09/01/20 0351        Code Status History    Date Active Date Inactive Code Status Order ID Comments User Context   08/17/2018 0303 08/23/2018 1511 DNR [id number]  Mancel Parsons, MD ED   Advance Care Planning Activity    Advance Directive Documentation   Flowsheet Row Most Recent Value  Type of Advance Directive Living will  Pre-existing out of facility DNR order (yellow form or pink MOST form) --  "MOST" Form in Place? --       Prognosis:   Unable to determine Appears guarded.   Discharge Planning:  Home with Home Health Recommend home-based palliative care but for right now the patient's niece wishes to maximize home with home health care, home-based physical therapy and is asking for home-based nursing aides to assist with caregiving at discharge.  Does not want SNF.  Care plan was discussed with bedside RN, niece [name] over the phone  Thank you for allowing the Palliative Medicine Team to assist in the care of this patient.   Time In: 10 Time Out: 10.25 Total Time  25 Prolonged Time Billed  no       Greater than 50%  of this time was spent counseling and coordinating care related to the above assessment and plan.  Rosalin Hawking, MD  Please contact Palliative Medicine Team phone at [phone] for questions and concerns.

## 2020-09-13 NOTE — Plan of Care (Signed)
  Problem: Clinical Measurements: Goal: Respiratory complications will improve Outcome: Progressing   Problem: Clinical Measurements: Goal: Cardiovascular complication will be avoided Outcome: Progressing   Problem: Nutrition: Goal: Adequate nutrition will be maintained Outcome: Progressing   Problem: Coping: Goal: Level of anxiety will decrease Outcome: Progressing   Problem: Pain Managment: Goal: General experience of comfort will improve Outcome: Progressing   Problem: Safety: Goal: Ability to remain free from injury will improve Outcome: Progressing   Problem: Respiratory: Goal: Will maintain a patent airway Outcome: Progressing   Problem: Respiratory: Goal: Complications related to the disease process, condition or treatment will be avoided or minimized Outcome: Progressing

## 2020-10-23 ENCOUNTER — Ambulatory Visit: Payer: Medicare Other | Admitting: Podiatry

## 2020-11-11 ENCOUNTER — Telehealth: Payer: Self-pay

## 2020-11-11 NOTE — Telephone Encounter (Signed)
Attempted to contact patient to schedule a Palliative Care consult appointment. No answer left a message to return call.  

## 2020-11-12 ENCOUNTER — Telehealth: Payer: Self-pay

## 2020-11-12 NOTE — Telephone Encounter (Signed)
Spoke with patient's niece [name] and scheduled an in-person Palliative Consult for 11/27/20 @ 11AM  COVID screening was negative. No pets in home. Patient lives with niece.  Consent obtained; updated Outlook/Netsmart/Team List and Epic.  Family is aware they will be receiving a call from NP the day before or day of to confirm appointment.

## 2020-11-27 ENCOUNTER — Other Ambulatory Visit: Payer: Self-pay

## 2020-11-27 ENCOUNTER — Other Ambulatory Visit: Payer: Federal, State, Local not specified - PPO | Admitting: Student

## 2020-11-27 ENCOUNTER — Encounter: Payer: Self-pay | Admitting: Student

## 2020-11-27 NOTE — Progress Notes (Signed)
Palliative NP made several attempts by phone to verify appointment for today. Also arrived at home and no one answered door. NP did later receive return call from patient's niece [name]. Palliative visit rescheduled for 12/03/20 at 3pm.

## 2020-12-03 ENCOUNTER — Other Ambulatory Visit: Payer: Self-pay | Admitting: Student

## 2020-12-03 ENCOUNTER — Other Ambulatory Visit: Payer: Self-pay

## 2020-12-03 DIAGNOSIS — Z515 Encounter for palliative care: Secondary | ICD-10-CM

## 2020-12-03 NOTE — Progress Notes (Signed)
[facility] Consult Note Telephone: [phone]  Fax: [phone]  PATIENT NAME: Kaitlin Smith [address] [place] [address] [id number] (home)  DOB: [birth year] MRN: [id number]  PRIMARY CARE PROVIDER:    Velna Hatchet, MD,  [place] [address] [phone]  REFERRING PROVIDER:   Velna Hatchet, [place],  [place] [address] [phone]  RESPONSIBLE PARTY:   Extended Emergency Contact Information Primary Emergency Contact: Harris,Deborah Address: [address]          [place], [place] [address] Johnnette Litter of [place] Phone: [phone] Relation: Niece Secondary Emergency Contact: Dorna Leitz Mobile Phone: [phone] Relation: Nephew  I met face to face with patient and family in home.   ASSESSMENT AND RECOMMENDATIONS:   Advance Care Planning: Visit at the request of Dr. Ardeth Perfect for palliative consult. Visit consisted of building trust and discussions on Palliative care medicine as specialized medical care for people living with serious illness, aimed at facilitating improved quality of life through symptoms relief, assisting with advance care planning and establishing goals of care. Education provided on Palliative Medicine vs. Hospice services. Palliative care will continue to provide support to patient, family and the medical team.  Goal of care: for patient to remain in the home.  Directives: DNR; reviewed MOST form today. Niece Neoma Smith would like to discuss further with patient.   Symptom Management:   Weakness-patient with generalized weakness. She is currently receiving PT/OT services with [facility]; recommend continue therapy as directed. Recommend mechanical lift if patient continues to have difficulty with transfers, bearing weight d/t lower extremity weakness.   Pain-patient reports occasional pain to shoulders, knees. Recommend acetaminophen 644m every  6 hours PRN, Voltaren gel 1%, apply QID prn to affected area of pain.   Follow up Palliative Care Visit: Palliative care will continue to follow for complex decision making and symptom management. Return in 4 weeks or prn.  [facility] /Caregiver/[facility] Supports[facility]. Palliative Medicine will continue to provide support.    I spent 45 minutes providing this consultation, from 2:45pm to 3:30pm. Time includes time spent with patient/family, chart review, provider coordination, and documentation. More than 50% of the time in this consultation was spent counseling and coordinating communication.   CHIEF COMPLAINT: Palliative Medicine initial consult, weakness.   History obtained from review of EMR, discussion with primary team, and  interview with family. Findings are summarized below.   HISTORY OF PRESENT ILLNESS:  LKhyra Viscusois a 87y.o. year old female with multiple medical problems including dementia, T2DM, physical deconditioning, acute respiratory failure with hypoxia, HTN, CVA, PVD, AAA, DVT, NSTEMI. Palliative Care was asked to follow this patient by consultation request of HVelna Hatchet MD to help address advance care planning and goals of care. This is an initial visit.  Patient presently resides in the home with her niece Kaitlin Smith who is her primary care giver. Patient reports doing okay. Kaitlin Lamingstates patient has not ambulated since hospitalization in December. [facility] Patient has a fear of falling; she most recently had a fall yesterday where she attempted to get up unassisted. Good appetite reported; AM blood sugars usually in the 90's; evening blood sugars around 1643mdL. She is sleeping well at night. Reports occasional pain to shoulders, knees. Denies chest pain, shortness of breath. She does report dizziness if she turns/changes positions too fast.   CODE STATUS: DNR  PPS: 40%  HOSPICE  ELIGIBILITY/DIAGNOSIS:  TBD  ROS   General: NAD EYES: impaired vision ENMT: denies dysphagia Cardiovascular: denies chest pain, palpitations Pulmonary: denies cough, denies increased SOB Abdomen: endorses fair appetite GU: denies dysuria,  incontinent of urine MSK:  LE weaknes, no fall yesterday Skin: denies rashes or wounds Neurological: endorses weakness, sleeping well Psych: Endorses positive mood Heme/lymph/immuno: denies bruises, abnormal bleeding   Physical Exam: Pulse 80, resp 16, blood pressure 130/78, sats 97% on room air Constitutional: NAD General: A & O x 2, frail appearing EYES: anicteric sclera, lids intact, no discharge  ENMT: intact hearing, oral mucous membranes moist CV: RRR, trace LE edema Pulmonary: LCTA, no increased work of breathing, no cough Abdomen: non-tender to palpation, bowel sounds normoactive x 4 GU: deferred MSK: sarcopenia, moves all extremities, no contractures of LE, non ambulatory Skin: warm and dry, no rashes or wounds on visible skin Neuro: Generalized weakness Psych: non anxious affect today Hem/lymph/immuno: no widespread bruising   PAST MEDICAL HISTORY:  Past Medical History:  Diagnosis Date  . Allergy   . Arthritis   . Blindness   . Coronary artery disease   . Diabetes mellitus without complication ([place])   . Glaucoma   . Hyperlipidemia   . Hypertension   . Myocardial infarction ([place])   . Peripheral vascular disease ([place])   . Stroke [facility])     SOCIAL HX:  Social History   Tobacco Use  . Smoking status: Former Smoker    Types: Cigarettes    Quit date: 06/06/2002    Years since quitting: 18.5  . Smokeless tobacco: Never Used  . Tobacco comment: Quit at age 30  Substance Use Topics  . Alcohol use: No    Alcohol/week: 0.0 standard drinks   FAMILY HX:  Family History  Problem Relation Age of Onset  . Diabetes Mother   . Stroke Mother   . Peripheral vascular disease Mother        amputation  . Cancer Brother   .  Diabetes Brother   . Alcohol abuse Brother   . Varicose Veins Brother   . Hypertension Father   . Varicose Veins Father   . Heart attack Father   . Cancer Father   . Diabetes Sister   . Hypertension Sister     ALLERGIES:  Allergies  Allergen Reactions  . Clonidine Derivatives Other (See Comments)    Pass out; makes her blood pressure too low  . Milk-Related Compounds Other (See Comments)    Lactose intolerant.       PERTINENT MEDICATIONS:  Outpatient Encounter Medications as of 12/03/2020  Medication Sig  . acetaminophen (TYLENOL) 325 MG tablet Take 2 tablets (650 mg total) by mouth every 6 (six) hours as needed for mild pain (temp > 101.5).  Marland Kitchen aspirin EC 81 MG EC tablet Take 1 tablet (81 mg total) by mouth daily. Swallow whole.  . calcium carbonate (OSCAL) 1500 (600 Ca) MG TABS tablet Take 1,500 mg by mouth daily with breakfast.  . carvedilol (COREG) 12.5 MG tablet Take 1 tablet (12.5 mg total) by mouth 2 (two) times daily with a meal.  . ketoconazole (NIZORAL) 2 % cream Apply 1 application topically daily. Apply to both feet and between toes once daily for 6 weeks  . loratadine (CLARITIN) 10 MG tablet Take 10 mg by mouth daily.  Marland Kitchen losartan (COZAAR) 50 MG tablet Take 1 tablet (50 mg total) by mouth daily.  Marland Kitchen LUMIGAN 0.01 % SOLN Place 1 drop into both eyes at bedtime.  Marland Kitchen  pantoprazole (PROTONIX) 40 MG tablet TAKE 1 TABLET BY MOUTH EVERY DAY  . polyethylene glycol powder (GLYCOLAX/MIRALAX) powder Take 17 g by mouth daily.  . SIMBRINZA 1-0.2 % SUSP   . tamsulosin (FLOMAX) 0.4 MG CAPS capsule tamsulosin 0.4 mg capsule  . Vitamin D, Ergocalciferol, (DRISDOL) 1.25 MG (50000 UNIT) CAPS capsule Take 50,000 Units by mouth once a week.  . VOLTAREN 1 % GEL APPLY 4 GRAMS TOPICALLY 2 TIMES DAILY AS NEEDED.   No facility-administered encounter medications on file as of 12/03/2020.     Thank you for the opportunity to participate in the care of Ms. Burress. The palliative care team will  continue to follow. Please call our office at [phone] if we can be of additional assistance.   S , NP      

## 2021-01-01 ENCOUNTER — Inpatient Hospital Stay (HOSPITAL_COMMUNITY)
Admission: EM | Admit: 2021-01-01 | Discharge: 2021-01-05 | DRG: 176 | Disposition: A | Discharge status: Home / Self Care | Payer: Medicare Other | Attending: Family Medicine | Admitting: Family Medicine

## 2021-01-01 ENCOUNTER — Emergency Department (HOSPITAL_COMMUNITY): Payer: Medicare Other

## 2021-01-01 ENCOUNTER — Encounter (HOSPITAL_COMMUNITY): Payer: Self-pay

## 2021-01-01 ENCOUNTER — Other Ambulatory Visit: Payer: Self-pay

## 2021-01-01 DIAGNOSIS — E785 Hyperlipidemia, unspecified: Secondary | ICD-10-CM | POA: Diagnosis present

## 2021-01-01 DIAGNOSIS — Z823 Family history of stroke: Secondary | ICD-10-CM

## 2021-01-01 DIAGNOSIS — I2699 Other pulmonary embolism without acute cor pulmonale: Principal | ICD-10-CM | POA: Diagnosis present

## 2021-01-01 DIAGNOSIS — Z8249 Family history of ischemic heart disease and other diseases of the circulatory system: Secondary | ICD-10-CM

## 2021-01-01 DIAGNOSIS — R531 Weakness: Secondary | ICD-10-CM

## 2021-01-01 DIAGNOSIS — I2510 Atherosclerotic heart disease of native coronary artery without angina pectoris: Secondary | ICD-10-CM | POA: Diagnosis present

## 2021-01-01 DIAGNOSIS — E739 Lactose intolerance, unspecified: Secondary | ICD-10-CM | POA: Diagnosis present

## 2021-01-01 DIAGNOSIS — Z7982 Long term (current) use of aspirin: Secondary | ICD-10-CM

## 2021-01-01 DIAGNOSIS — E119 Type 2 diabetes mellitus without complications: Secondary | ICD-10-CM

## 2021-01-01 DIAGNOSIS — F0390 Unspecified dementia without behavioral disturbance: Secondary | ICD-10-CM | POA: Diagnosis present

## 2021-01-01 DIAGNOSIS — Z20822 Contact with and (suspected) exposure to covid-19: Secondary | ICD-10-CM | POA: Diagnosis present

## 2021-01-01 DIAGNOSIS — N390 Urinary tract infection, site not specified: Secondary | ICD-10-CM | POA: Diagnosis present

## 2021-01-01 DIAGNOSIS — I10 Essential (primary) hypertension: Secondary | ICD-10-CM | POA: Diagnosis present

## 2021-01-01 DIAGNOSIS — I252 Old myocardial infarction: Secondary | ICD-10-CM

## 2021-01-01 DIAGNOSIS — Z888 Allergy status to other drugs, medicaments and biological substances status: Secondary | ICD-10-CM

## 2021-01-01 DIAGNOSIS — I2694 Multiple subsegmental pulmonary emboli without acute cor pulmonale: Secondary | ICD-10-CM

## 2021-01-01 DIAGNOSIS — Z87891 Personal history of nicotine dependence: Secondary | ICD-10-CM

## 2021-01-01 DIAGNOSIS — Z833 Family history of diabetes mellitus: Secondary | ICD-10-CM

## 2021-01-01 DIAGNOSIS — I739 Peripheral vascular disease, unspecified: Secondary | ICD-10-CM | POA: Diagnosis present

## 2021-01-01 DIAGNOSIS — I82431 Acute embolism and thrombosis of right popliteal vein: Secondary | ICD-10-CM | POA: Diagnosis present

## 2021-01-01 DIAGNOSIS — Z66 Do not resuscitate: Secondary | ICD-10-CM | POA: Diagnosis present

## 2021-01-01 DIAGNOSIS — Z8673 Personal history of transient ischemic attack (TIA), and cerebral infarction without residual deficits: Secondary | ICD-10-CM

## 2021-01-01 DIAGNOSIS — Z8616 Personal history of COVID-19: Secondary | ICD-10-CM

## 2021-01-01 DIAGNOSIS — Z79899 Other long term (current) drug therapy: Secondary | ICD-10-CM

## 2021-01-01 DIAGNOSIS — Z7901 Long term (current) use of anticoagulants: Secondary | ICD-10-CM

## 2021-01-01 DIAGNOSIS — I82412 Acute embolism and thrombosis of left femoral vein: Secondary | ICD-10-CM | POA: Diagnosis present

## 2021-01-01 LAB — CBC WITH DIFFERENTIAL/PLATELET
Abs Immature Granulocytes: 0.04 10*3/uL (ref 0.00–0.07)
Basophils Absolute: 0 10*3/uL (ref 0.0–0.1)
Basophils Relative: 0 %
Eosinophils Absolute: 0.3 10*3/uL (ref 0.0–0.5)
Eosinophils Relative: 4 %
HCT: 39.3 % (ref 36.0–46.0)
Hemoglobin: 12.6 g/dL (ref 12.0–15.0)
Immature Granulocytes: 1 %
Lymphocytes Relative: 20 %
Lymphs Abs: 1.4 10*3/uL (ref 0.7–4.0)
MCH: 32.5 pg (ref 26.0–34.0)
MCHC: 32.1 g/dL (ref 30.0–36.0)
MCV: 101.3 fL — ABNORMAL HIGH (ref 80.0–100.0)
Monocytes Absolute: 0.4 10*3/uL (ref 0.1–1.0)
Monocytes Relative: 6 %
Neutro Abs: 4.8 10*3/uL (ref 1.7–7.7)
Neutrophils Relative %: 69 %
Platelets: 148 10*3/uL — ABNORMAL LOW (ref 150–400)
RBC: 3.88 MIL/uL (ref 3.87–5.11)
RDW: 12.7 % (ref 11.5–15.5)
WBC: 6.9 10*3/uL (ref 4.0–10.5)
nRBC: 0 % (ref 0.0–0.2)

## 2021-01-01 LAB — URINALYSIS, ROUTINE W REFLEX MICROSCOPIC
Bilirubin Urine: NEGATIVE
Glucose, UA: NEGATIVE mg/dL
Ketones, ur: NEGATIVE mg/dL
Nitrite: NEGATIVE
Protein, ur: 30 mg/dL — AB
Specific Gravity, Urine: 1.015 (ref 1.005–1.030)
WBC, UA: >50 WBC/hpf — ABNORMAL HIGH (ref 0–5)
pH: 6 (ref 5.0–8.0)

## 2021-01-01 LAB — COMPREHENSIVE METABOLIC PANEL
ALT: 7 U/L (ref 0–44)
AST: 15 U/L (ref 15–41)
Albumin: 2.4 g/dL — ABNORMAL LOW (ref 3.5–5.0)
Alkaline Phosphatase: 63 U/L (ref 38–126)
Anion gap: 7 (ref 5–15)
BUN: 10 mg/dL (ref 8–23)
CO2: 18 mmol/L — ABNORMAL LOW (ref 22–32)
Calcium: 6.7 mg/dL — ABNORMAL LOW (ref 8.9–10.3)
Chloride: 115 mmol/L — ABNORMAL HIGH (ref 98–111)
Creatinine, Ser: 0.54 mg/dL (ref 0.44–1.00)
GFR, Estimated: >60 mL/min (ref >60)
Glucose, Bld: 79 mg/dL (ref 70–99)
Potassium: 3.7 mmol/L (ref 3.5–5.1)
Sodium: 140 mmol/L (ref 135–145)
Total Bilirubin: 1 mg/dL (ref 0.3–1.2)
Total Protein: 5.1 g/dL — ABNORMAL LOW (ref 6.5–8.1)

## 2021-01-01 LAB — TROPONIN I (HIGH SENSITIVITY): Troponin I (High Sensitivity): 16 ng/L (ref <18)

## 2021-01-01 MED ORDER — IOHEXOL 350 MG/ML SOLN
100.0000 mL | Freq: Once | INTRAVENOUS | Status: AC | PRN
  Administered 2021-01-01: 100 mL via INTRAVENOUS

## 2021-01-01 MED ORDER — HEPARIN BOLUS VIA INFUSION
2000.0000 [IU] | Freq: Once | INTRAVENOUS | Status: DC
  Administered 2021-01-02: 2000 [IU] via INTRAVENOUS
  Filled 2021-01-01: qty 2000

## 2021-01-01 MED ORDER — HEPARIN (PORCINE) 25000 UT/250ML-% IV SOLN
1000.0000 [IU]/h | INTRAVENOUS | Status: DC
  Administered 2021-01-02: 1000 [IU]/h via INTRAVENOUS
  Filled 2021-01-01: qty 250

## 2021-01-01 NOTE — ED Triage Notes (Signed)
Pt came from home via EMS. Pt found on bedside commode upon EMS arrival. Per EMS, pt had a syncopal episode and lost consciousness for several minutes. Pt responded to name at the time of EMS arrival. Pt found cool to touch, diaphoretic and difficult to arouse. Radial pulses not palpable upon EMS arrival. Just finished antibx for UTI. Pt complaining of constipation and unable to empty bladder. Pt is blind and hard of hearing. 20 in Left FA; 500 NS  Last BP 113/56 152 CBG 96% on RA 16 RR

## 2021-01-01 NOTE — ED Provider Notes (Signed)
[facility]   CSN: [id number] Arrival date & time: 01/01/21  1724     History Chief Complaint  Patient presents with  . Loss of Consciousness    Trina Asch is a 87 y.o. female.  The history is provided by the patient, the EMS personnel and medical records.  Loss of Consciousness  Raneen Jaffer is a 87 y.o. female who presents to the Emergency Department complaining of syncope. She presents the emergency department by EMS for evaluation following a syncopal event. She has a history of chronic constipation. She was sitting on the commode at home attempting to have a bowel movement when she became unresponsive. On fire arrival she was minimally responsive and had thready radial pulses. Initial blood pressure was 90 systolic. She was treated with 500 mL of normal saline by EMS and her mental status gradually improved by the time of ED arrival. Patient denies any complaints. She was recently treated for urinary tract infection and had her last dose of antibiotics this morning. She denies any fevers, headache, chest pain, abdominal pain, nausea, vomiting, dysuria.    Past Medical History:  Diagnosis Date  . Allergy   . Arthritis   . Blindness   . Coronary artery disease   . Diabetes mellitus without complication (HCC)   . Glaucoma   . Hyperlipidemia   . Hypertension   . Myocardial infarction (HCC)   . Peripheral vascular disease (HCC)   . Stroke [facility])     Patient Active Problem List   Diagnosis Date Noted  . Acute on chronic combined systolic and diastolic CHF (congestive heart failure) (HCC)   . Severe sepsis (HCC)   . Non-ST elevation (NSTEMI) myocardial infarction (HCC)   . Acute respiratory failure with hypoxia (HCC)   . Palliative care by specialist   . Goals of care, counseling/discussion   . General weakness   . Controlled type 2 diabetes mellitus with hyperglycemia (HCC) 09/01/2020  . COVID 09/01/2020  . Acute lower  UTI 09/01/2020  . Acute hypoxemic respiratory failure due to COVID-19 (HCC) 09/01/2020  . COVID-19 virus infection 08/31/2020  . Aortic dissection (HCC) 08/17/2018  . Hypertensive emergency 08/17/2018  . Abnormal EKG 01/30/2018  . Osteoarthritis of right knee 12/04/2017  . MCI (mild cognitive impairment) 06/30/2015  . Essential hypertension 08/20/2014  . Hyperlipidemia 08/20/2014  . Peripheral arterial disease (HCC) 08/20/2014  . Carotid artery disease (HCC) 06/06/2014  . Uncontrolled diabetes mellitus type 2 without complications 05/13/2014  . History of CVA (cerebrovascular accident) 05/13/2014  . History of MI (myocardial infarction) 05/13/2014    Past Surgical History:  Procedure Laterality Date  . EYE SURGERY       OB History   No obstetric history on file.     Family History  Problem Relation Age of Onset  . Diabetes Mother   . Stroke Mother   . Peripheral vascular disease Mother        amputation  . Cancer Brother   . Diabetes Brother   . Alcohol abuse Brother   . Varicose Veins Brother   . Hypertension Father   . Varicose Veins Father   . Heart attack Father   . Cancer Father   . Diabetes Sister   . Hypertension Sister     Social History   Tobacco Use  . Smoking status: Former Smoker    Types: Cigarettes    Quit date: 06/06/2002    Years since quitting: 18.5  . Smokeless tobacco:  Never Used  . Tobacco comment: Quit at age 10  Substance Use Topics  . Alcohol use: No    Alcohol/week: 0.0 standard drinks  . Drug use: No    Home Medications Prior to Admission medications   Medication Sig Start Date End Date Taking? Authorizing Provider  acetaminophen (TYLENOL) 325 MG tablet Take 2 tablets (650 mg total) by mouth every 6 (six) hours as needed for mild pain (temp > 101.5). 08/22/18   Elgergawy, Leana Roe, MD  aspirin EC 81 MG EC tablet Take 1 tablet (81 mg total) by mouth daily. Swallow whole. 09/14/20   Azucena Fallen, MD  calcium carbonate  (OSCAL) 1500 (600 Ca) MG TABS tablet Take 1,500 mg by mouth daily with breakfast. Patient not taking: Reported on 12/03/2020    [provider]  carvedilol (COREG) 12.5 MG tablet Take 1 tablet (12.5 mg total) by mouth 2 (two) times daily with a meal. 09/13/20   Azucena Fallen, MD  ketoconazole (NIZORAL) 2 % cream Apply 1 application topically daily. Apply to both feet and between toes once daily for 6 weeks Patient not taking: Reported on 12/03/2020 09/03/18   Freddie Breech, DPM  loratadine (CLARITIN) 10 MG tablet Take 10 mg by mouth daily.    [provider]  losartan (COZAAR) 50 MG tablet Take 1 tablet (50 mg total) by mouth daily. 09/14/20   Azucena Fallen, MD  LUMIGAN 0.01 % SOLN Place 1 drop into both eyes at bedtime. 09/10/19   [provider]  pantoprazole (PROTONIX) 40 MG tablet TAKE 1 TABLET BY MOUTH EVERY DAY 11/15/19   Runell Gess, MD  polyethylene glycol powder (GLYCOLAX/MIRALAX) powder Take 17 g by mouth daily. 01/16/15   [name], NP  SIMBRINZA 1-0.2 % SUSP  02/13/20   [provider]  tamsulosin (FLOMAX) 0.4 MG CAPS capsule tamsulosin 0.4 mg capsule    [provider]  Vitamin D, Ergocalciferol, (DRISDOL) 1.25 MG (50000 UNIT) CAPS capsule Take 50,000 Units by mouth once a week. Patient not taking: Reported on 12/03/2020 11/01/19   [provider]  VOLTAREN 1 % GEL APPLY 4 GRAMS TOPICALLY 2 TIMES DAILY AS NEEDED. Patient not taking: Reported on 12/03/2020 04/25/16   Runell Gess, MD    Allergies    Clonidine derivatives and Milk-related compounds  Review of Systems   Review of Systems  Cardiovascular: Positive for syncope.  All other systems reviewed and are negative.   Physical Exam Updated Vital Signs BP (!) 183/88   Pulse 80   Temp 97.6 F (36.4 C) (Oral)   Resp 16   Ht 5\' 5"  (1.651 m)   Wt 66.7 kg   SpO2 99%   BMI 24.46 kg/m   Physical Exam Vitals and nursing note reviewed.   Constitutional:      Appearance: She is well-developed.  HENT:     Head: Normocephalic and atraumatic.  Cardiovascular:     Rate and Rhythm: Normal rate and regular rhythm.     Heart sounds: No murmur heard.   Pulmonary:     Effort: Pulmonary effort is normal. No respiratory distress.     Breath sounds: Normal breath sounds.  Abdominal:     Palpations: Abdomen is soft.     Tenderness: There is no abdominal tenderness. There is no guarding or rebound.  Musculoskeletal:        General: No tenderness.  Skin:    General: Skin is warm and dry.  Neurological:  Mental Status: She is alert and oriented to person, place, and time.     Comments: 5/5 strength in all four extremities  Psychiatric:        Behavior: Behavior normal.     ED Results / Procedures / Treatments   Labs (all labs ordered are listed, but only abnormal results are displayed) Labs Reviewed  CBC WITH DIFFERENTIAL/PLATELET - Abnormal; Notable for the following components:      Result Value   MCV 101.3 (*)    Platelets 148 (*)    All other components within normal limits  COMPREHENSIVE METABOLIC PANEL - Abnormal; Notable for the following components:   Chloride 115 (*)    CO2 18 (*)    Calcium 6.7 (*)    Total Protein 5.1 (*)    Albumin 2.4 (*)    All other components within normal limits  URINE CULTURE  URINALYSIS, ROUTINE W REFLEX MICROSCOPIC  TROPONIN I (HIGH SENSITIVITY)    EKG EKG Interpretation  Date/Time:  Friday January 01 2021 17:42:40 EDT Ventricular Rate:  65 PR Interval:  224 QRS Duration: 92 QT Interval:  446 QTC Calculation: 464 R Axis:   -13 Text Interpretation: Sinus rhythm Prolonged PR interval Abnormal R-wave progression, late transition Probable left ventricular hypertrophy Abnormal T, probable ischemia, anterior leads Confirmed by Tilden Fossa [id number]) on 01/01/2021 5:53:49 PM   Radiology No results found.  Procedures Procedures   Medications Ordered in ED Medications   iohexol (OMNIPAQUE) 350 MG/ML injection 100 mL (100 mLs Intravenous Contrast Given 01/01/21 2249)    ED Course  I have reviewed the triage vital signs and the nursing notes.  Pertinent labs & imaging results that were available during my care of the patient were reviewed by me and considered in my medical decision making (see chart for details).    MDM Rules/Calculators/A&P                         patient here for evaluation following a syncopal event on the commode. She is asymptomatic on ED assessment. Patient care transferred pending CTA.  Final Clinical Impression(s) / ED Diagnoses Final diagnoses:  None    Rx / DC Orders ED Discharge Orders    None       Tilden Fossa, MD 01/01/21 2322

## 2021-01-01 NOTE — Progress Notes (Signed)
ANTICOAGULATION CONSULT NOTE - Initial Consult  Pharmacy Consult for Heparin Indication: pulmonary embolus  Allergies  Allergen Reactions  . Clonidine Derivatives Other (See Comments)    Pass out; makes her blood pressure too low  . Milk-Related Compounds Other (See Comments)    Lactose intolerant.      Patient Measurements: Height: 5\' 5"  (165.1 cm) Weight: 66.7 kg (147 lb) IBW/kg (Calculated) : 57 Heparin Dosing Weight: actual body weight  Vital Signs: Temp: 97.6 F (36.4 C) (04/15 1748) Temp Source: Oral (04/15 1748) BP: 183/88 (04/15 2310) Pulse Rate: 80 (04/15 2310)  Labs: Recent Labs    01/01/21 1742 01/01/21 1906  HGB 12.6  --   HCT 39.3  --   PLT 148*  --   CREATININE  --  0.54  TROPONINIHS  --  16    Estimated Creatinine Clearance: 43.7 mL/min (by C-G formula based on SCr of 0.54 mg/dL).   Medical History: Past Medical History:  Diagnosis Date  . Allergy   . Arthritis   . Blindness   . Coronary artery disease   . Diabetes mellitus without complication (HCC)   . Glaucoma   . Hyperlipidemia   . Hypertension   . Myocardial infarction (HCC)   . Peripheral vascular disease (HCC)   . Stroke [facility])     Medications:  No anticoagulation PTA  Assessment:  87 yr female presented to ED due to syncopal event  CTAngio = + multiple segmental and subsegmental bilateral PE  Pharmacy consulted to dose IV heparin  Goal of Therapy:  Heparin level 0.3-0.7 units/ml Monitor platelets by anticoagulation protocol: Yes   Plan:  - Obtain baseline aPTT and PTT/INR - Heparin 2000 units IV bolus x 1 followed by heparin gtt @ 1000 units/hr - Check heparin level 8 hr after IV heparin gtt started - Monitory for signs/symptoms of bleeding - Follow daily heparin level and CBC  Sheza Strickland, 89, PharmD 01/01/2021,11:42 PM

## 2021-01-01 NOTE — ED Provider Notes (Signed)
  Provider Note MRN:  [id number]  Arrival date & time: 01/01/21    ED Course and Medical Decision Making  Assumed care from Dr. Madilyn Hook at shift change.  Loss of consciousness during bowel movement, no complaints at this time.  Was briefly unresponsive.  History of aortic ulcer, awaiting CT imaging.  Candidate for discharge.  11:30 PM update: CT revealing multiple segmental and subsegmental pulmonary emboli bilaterally.  Difficult to assess for heart strain on CT per radiology, however unlikely given negative troponin.  Patient remains hemodynamically stable.  Will admit to medicine, start heparin.  .Critical Care Performed by: Sabas Sous, MD Authorized by: Sabas Sous, MD   Critical care provider statement:    Critical care time (minutes):  32   Critical care was necessary to treat or prevent imminent or life-threatening deterioration of the following conditions: Multiple pulmonary emboli.   Critical care was time spent personally by me on the following activities:  Discussions with consultants, evaluation of patient's response to treatment, examination of patient, ordering and performing treatments and interventions, ordering and review of laboratory studies, ordering and review of radiographic studies, pulse oximetry, re-evaluation of patient's condition, obtaining history from patient or surrogate and review of old charts   I assumed direction of critical care for this patient from another provider in my specialty: yes     Care discussed with: admitting provider      Final Clinical Impressions(s) / ED Diagnoses     ICD-10-CM   1. Multiple subsegmental pulmonary emboli without acute cor pulmonale (HCC)  I26.94     ED Discharge Orders    None      Discharge Instructions   None     Elmer Sow. Pilar Plate, MD [facility] Emergency Medicine [facility] [email or web address]    Sabas Sous, MD 01/01/21 [phone]

## 2021-01-01 NOTE — ED Notes (Signed)
Called phlebotomy to recollect light green tube because currently collected light green is hemolyzed

## 2021-01-02 DIAGNOSIS — I2694 Multiple subsegmental pulmonary emboli without acute cor pulmonale: Secondary | ICD-10-CM | POA: Diagnosis not present

## 2021-01-02 DIAGNOSIS — I2510 Atherosclerotic heart disease of native coronary artery without angina pectoris: Secondary | ICD-10-CM | POA: Diagnosis present

## 2021-01-02 DIAGNOSIS — Z823 Family history of stroke: Secondary | ICD-10-CM | POA: Diagnosis not present

## 2021-01-02 DIAGNOSIS — I2699 Other pulmonary embolism without acute cor pulmonale: Secondary | ICD-10-CM | POA: Diagnosis present

## 2021-01-02 DIAGNOSIS — Z888 Allergy status to other drugs, medicaments and biological substances status: Secondary | ICD-10-CM | POA: Diagnosis not present

## 2021-01-02 DIAGNOSIS — F0390 Unspecified dementia without behavioral disturbance: Secondary | ICD-10-CM | POA: Diagnosis present

## 2021-01-02 DIAGNOSIS — N390 Urinary tract infection, site not specified: Secondary | ICD-10-CM | POA: Diagnosis present

## 2021-01-02 DIAGNOSIS — I2602 Saddle embolus of pulmonary artery with acute cor pulmonale: Secondary | ICD-10-CM | POA: Diagnosis not present

## 2021-01-02 DIAGNOSIS — Z7901 Long term (current) use of anticoagulants: Secondary | ICD-10-CM | POA: Diagnosis not present

## 2021-01-02 DIAGNOSIS — Z8616 Personal history of COVID-19: Secondary | ICD-10-CM | POA: Diagnosis not present

## 2021-01-02 DIAGNOSIS — Z86718 Personal history of other venous thrombosis and embolism: Secondary | ICD-10-CM | POA: Diagnosis not present

## 2021-01-02 DIAGNOSIS — E785 Hyperlipidemia, unspecified: Secondary | ICD-10-CM | POA: Diagnosis present

## 2021-01-02 DIAGNOSIS — I10 Essential (primary) hypertension: Secondary | ICD-10-CM | POA: Diagnosis present

## 2021-01-02 DIAGNOSIS — R531 Weakness: Secondary | ICD-10-CM

## 2021-01-02 DIAGNOSIS — I82431 Acute embolism and thrombosis of right popliteal vein: Secondary | ICD-10-CM | POA: Diagnosis present

## 2021-01-02 DIAGNOSIS — Z66 Do not resuscitate: Secondary | ICD-10-CM | POA: Diagnosis present

## 2021-01-02 DIAGNOSIS — I82412 Acute embolism and thrombosis of left femoral vein: Secondary | ICD-10-CM | POA: Diagnosis present

## 2021-01-02 DIAGNOSIS — Z87891 Personal history of nicotine dependence: Secondary | ICD-10-CM | POA: Diagnosis not present

## 2021-01-02 DIAGNOSIS — Z8673 Personal history of transient ischemic attack (TIA), and cerebral infarction without residual deficits: Secondary | ICD-10-CM | POA: Diagnosis not present

## 2021-01-02 DIAGNOSIS — E739 Lactose intolerance, unspecified: Secondary | ICD-10-CM | POA: Diagnosis present

## 2021-01-02 DIAGNOSIS — Z79899 Other long term (current) drug therapy: Secondary | ICD-10-CM | POA: Diagnosis not present

## 2021-01-02 DIAGNOSIS — I739 Peripheral vascular disease, unspecified: Secondary | ICD-10-CM | POA: Diagnosis present

## 2021-01-02 DIAGNOSIS — Z8249 Family history of ischemic heart disease and other diseases of the circulatory system: Secondary | ICD-10-CM | POA: Diagnosis not present

## 2021-01-02 DIAGNOSIS — E119 Type 2 diabetes mellitus without complications: Secondary | ICD-10-CM | POA: Diagnosis present

## 2021-01-02 DIAGNOSIS — Z833 Family history of diabetes mellitus: Secondary | ICD-10-CM | POA: Diagnosis not present

## 2021-01-02 DIAGNOSIS — Z20822 Contact with and (suspected) exposure to covid-19: Secondary | ICD-10-CM | POA: Diagnosis present

## 2021-01-02 DIAGNOSIS — I252 Old myocardial infarction: Secondary | ICD-10-CM | POA: Diagnosis not present

## 2021-01-02 DIAGNOSIS — Z7982 Long term (current) use of aspirin: Secondary | ICD-10-CM | POA: Diagnosis not present

## 2021-01-02 LAB — CBC WITH DIFFERENTIAL/PLATELET
Abs Immature Granulocytes: 0.03 10*3/uL (ref 0.00–0.07)
Basophils Absolute: 0 10*3/uL (ref 0.0–0.1)
Basophils Relative: 0 %
Eosinophils Absolute: 0.3 10*3/uL (ref 0.0–0.5)
Eosinophils Relative: 4 %
HCT: 44.3 % (ref 36.0–46.0)
Hemoglobin: 15 g/dL (ref 12.0–15.0)
Immature Granulocytes: 0 %
Lymphocytes Relative: 32 %
Lymphs Abs: 2.4 10*3/uL (ref 0.7–4.0)
MCH: 32.1 pg (ref 26.0–34.0)
MCHC: 33.9 g/dL (ref 30.0–36.0)
MCV: 94.7 fL (ref 80.0–100.0)
Monocytes Absolute: 0.7 10*3/uL (ref 0.1–1.0)
Monocytes Relative: 10 %
Neutro Abs: 3.9 10*3/uL (ref 1.7–7.7)
Neutrophils Relative %: 54 %
Platelets: 219 10*3/uL (ref 150–400)
RBC: 4.68 MIL/uL (ref 3.87–5.11)
RDW: 12.7 % (ref 11.5–15.5)
WBC: 7.4 10*3/uL (ref 4.0–10.5)
nRBC: 0 % (ref 0.0–0.2)

## 2021-01-02 LAB — RESP PANEL BY RT-PCR (FLU A&B, COVID) ARPGX2
Influenza A by PCR: NEGATIVE
Influenza B by PCR: NEGATIVE
SARS Coronavirus 2 by RT PCR: NEGATIVE

## 2021-01-02 LAB — APTT: aPTT: 27 seconds (ref 24–36)

## 2021-01-02 LAB — GLUCOSE, CAPILLARY
Glucose-Capillary: 104 mg/dL — ABNORMAL HIGH (ref 70–99)
Glucose-Capillary: 93 mg/dL (ref 70–99)
Glucose-Capillary: 133 mg/dL — ABNORMAL HIGH (ref 70–99)
Glucose-Capillary: 103 mg/dL — ABNORMAL HIGH (ref 70–99)

## 2021-01-02 LAB — HEPARIN LEVEL (UNFRACTIONATED)
Heparin Unfractionated: 0.33 IU/mL (ref 0.30–0.70)
Heparin Unfractionated: 0.52 IU/mL (ref 0.30–0.70)
Heparin Unfractionated: 0.78 IU/mL — ABNORMAL HIGH (ref 0.30–0.70)

## 2021-01-02 LAB — PROTIME-INR
INR: 1.1 (ref 0.8–1.2)
Prothrombin Time: 13.9 seconds (ref 11.4–15.2)

## 2021-01-02 MED ORDER — CARVEDILOL 12.5 MG PO TABS
12.5000 mg | ORAL_TABLET | Freq: Two times a day (BID) | ORAL | Status: DC
  Administered 2021-01-02 – 2021-01-05 (×8): 12.5 mg via ORAL
  Filled 2021-01-02 (×8): qty 1

## 2021-01-02 MED ORDER — HEPARIN (PORCINE) 25000 UT/250ML-% IV SOLN
900.0000 [IU]/h | INTRAVENOUS | Status: DC
  Administered 2021-01-03: 900 [IU]/h via INTRAVENOUS
  Filled 2021-01-02 (×2): qty 250

## 2021-01-02 MED ORDER — PANTOPRAZOLE SODIUM 40 MG PO TBEC
40.0000 mg | DELAYED_RELEASE_TABLET | Freq: Every day | ORAL | Status: DC
  Administered 2021-01-02 – 2021-01-05 (×4): 40 mg via ORAL
  Filled 2021-01-02 (×4): qty 1

## 2021-01-02 MED ORDER — HALOPERIDOL LACTATE 5 MG/ML IJ SOLN: 2.0000 mg | Freq: Four times a day (QID) | INTRAMUSCULAR | Status: DC | PRN

## 2021-01-02 MED ORDER — ACETAMINOPHEN 325 MG PO TABS
650.0000 mg | ORAL_TABLET | Freq: Four times a day (QID) | ORAL | Status: DC | PRN
  Administered 2021-01-05 (×2): 650 mg via ORAL
  Filled 2021-01-02 (×2): qty 2

## 2021-01-02 MED ORDER — HYDRALAZINE HCL 20 MG/ML IJ SOLN
10.0000 mg | Freq: Four times a day (QID) | INTRAMUSCULAR | Status: DC | PRN
  Administered 2021-01-02: 10 mg via INTRAVENOUS
  Filled 2021-01-02: qty 1

## 2021-01-02 MED ORDER — HALOPERIDOL 0.5 MG PO TABS: 1.0000 mg | ORAL_TABLET | Freq: Four times a day (QID) | ORAL | Status: DC | PRN

## 2021-01-02 MED ORDER — SODIUM CHLORIDE 0.45 % IV SOLN: INTRAVENOUS | Status: DC

## 2021-01-02 MED ORDER — LOSARTAN POTASSIUM 50 MG PO TABS
50.0000 mg | ORAL_TABLET | Freq: Every day | ORAL | Status: DC
  Administered 2021-01-02 – 2021-01-05 (×4): 50 mg via ORAL
  Filled 2021-01-02 (×4): qty 1

## 2021-01-02 MED ORDER — TAMSULOSIN HCL 0.4 MG PO CAPS
0.4000 mg | ORAL_CAPSULE | Freq: Every day | ORAL | Status: DC
  Administered 2021-01-02 – 2021-01-05 (×4): 0.4 mg via ORAL
  Filled 2021-01-02 (×4): qty 1

## 2021-01-02 MED ORDER — SODIUM CHLORIDE 0.9 % IV SOLN
1.0000 g | INTRAVENOUS | Status: DC
  Administered 2021-01-02 – 2021-01-03 (×2): 1 g via INTRAVENOUS
  Filled 2021-01-02 (×2): qty 1

## 2021-01-02 MED ORDER — HALOPERIDOL LACTATE 5 MG/ML IJ SOLN
2.0000 mg | Freq: Once | INTRAMUSCULAR | Status: AC
  Administered 2021-01-02: 2 mg via INTRAVENOUS
  Filled 2021-01-02: qty 1

## 2021-01-02 MED ORDER — INSULIN ASPART 100 UNIT/ML ~~LOC~~ SOLN
0.0000 [IU] | Freq: Three times a day (TID) | SUBCUTANEOUS | Status: DC
  Administered 2021-01-02: 1 [IU] via SUBCUTANEOUS
  Filled 2021-01-02: qty 0.09

## 2021-01-02 MED ORDER — LATANOPROST 0.005 % OP SOLN
1.0000 [drp] | Freq: Every day | OPHTHALMIC | Status: DC
  Administered 2021-01-02 – 2021-01-05 (×4): 1 [drp] via OPHTHALMIC
  Filled 2021-01-02: qty 2.5

## 2021-01-02 MED ORDER — CIPROFLOXACIN IN D5W 400 MG/200ML IV SOLN
400.0000 mg | Freq: Two times a day (BID) | INTRAVENOUS | Status: DC
  Administered 2021-01-02: 400 mg via INTRAVENOUS
  Filled 2021-01-02: qty 200

## 2021-01-02 NOTE — H&P (Signed)
History and Physical    Kaitlin Smith MEB:[id number] DOB: 1930/08/09 DOA: 01/01/2021  PCP: Alysia Penna, MD (Confirm with patient/family/NH records and if not entered, this has to be entered at [facility] point of entry) Patient coming from: long term care  I have personally briefly reviewed patient's old medical records in [facility]  Chief Complaint: syncopal episode  HPI: Kaitlin Smith is a 87 y.o. female with medical history significant of CAD, DM, HLD, dementia, Glaucoma, h/o CVA was straining on the commode when she had a syncopal episode with LOC estimated to last 10 minutes. She has h/o AAA with previous dissection. EMS activated and transported her to [facility] for evaluation.    ED Course: T 97.6  226/110  HR 96  RR 18. ED - P exam non-focal. Cmet nl, Trop 16, U/A cloudy, LE large, many bacteria, > 50 wbc/hpf. CBCD pending. CTA chest, abdomen, pelvis order to r/o aortic dissection revealed multiple subsegmental PE. In the ED she wa given IVF bolus of 500cc which improved pulse and mentation. She was started on full dose heparin with Pharmacy consulting. [facility] called to admit for continue treatment of UTI and PE.   Review of Systems: As per HPI otherwise 10 point review of systems negative. Caveat - demented patient could not give good history   Past Medical History:  Diagnosis Date  . Allergy   . Arthritis   . Blindness   . Coronary artery disease   . Diabetes mellitus without complication (HCC)   . Glaucoma   . Hyperlipidemia   . Hypertension   . Myocardial infarction (HCC)   . Peripheral vascular disease (HCC)   . Stroke [facility])     Past Surgical History:  Procedure Laterality Date  . EYE SURGERY      Soc Hx - could not obtain from patient. No family present   reports that she quit smoking about 18 years ago. Her smoking use included cigarettes. She has never used smokeless tobacco. She reports that she does not drink alcohol and does not use drugs.  Allergies  Allergen  Reactions  . Clonidine Derivatives Other (See Comments)    Pass out; makes her blood pressure too low  . Milk-Related Compounds Other (See Comments)    Lactose intolerant.      Family History  Problem Relation Age of Onset  . Diabetes Mother   . Stroke Mother   . Peripheral vascular disease Mother        amputation  . Cancer Brother   . Diabetes Brother   . Alcohol abuse Brother   . Varicose Veins Brother   . Hypertension Father   . Varicose Veins Father   . Heart attack Father   . Cancer Father   . Diabetes Sister   . Hypertension Sister      Prior to Admission medications   Medication Sig Start Date End Date Taking? Authorizing Provider  acetaminophen (TYLENOL) 325 MG tablet Take 2 tablets (650 mg total) by mouth every 6 (six) hours as needed for mild pain (temp > 101.5). 08/22/18  Yes Elgergawy, Leana Roe, MD  aspirin EC 81 MG EC tablet Take 1 tablet (81 mg total) by mouth daily. Swallow whole. 09/14/20  Yes Azucena Fallen, MD  carvedilol (COREG) 12.5 MG tablet Take 1 tablet (12.5 mg total) by mouth 2 (two) times daily with a meal. 09/13/20  Yes Azucena Fallen, MD  losartan (COZAAR) 50 MG tablet Take 1 tablet (50 mg total) by mouth daily.  09/14/20  Yes Azucena Fallen, MD  LUMIGAN 0.01 % SOLN Place 1 drop into both eyes at bedtime. 09/10/19  Yes [provider]  pantoprazole (PROTONIX) 40 MG tablet TAKE 1 TABLET BY MOUTH EVERY DAY 11/15/19  Yes Runell Gess, MD  SIMBRINZA 1-0.2 % SUSP Place 1 drop into both eyes daily. 02/13/20  Yes [provider]  tamsulosin (FLOMAX) 0.4 MG CAPS capsule Take 0.4 mg by mouth daily.   Yes [provider]  VOLTAREN 1 % GEL APPLY 4 GRAMS TOPICALLY 2 TIMES DAILY AS NEEDED. 04/25/16   Runell Gess, MD    Physical Exam: Vitals:   01/01/21 2230 01/01/21 2310 01/02/21 0035 01/02/21 0100  BP: (!) 171/72 (!) 183/88 (!) 226/110 (!) 188/95  Pulse: 77 80 96 99  Resp: 18 16 18 16   Temp:      TempSrc:       SpO2: 100% 99% 97% 93%  Weight:      Height:        Constitutional: NAD, calm, comfortable Vitals:   01/01/21 2230 01/01/21 2310 01/02/21 0035 01/02/21 0100  BP: (!) 171/72 (!) 183/88 (!) 226/110 (!) 188/95  Pulse: 77 80 96 99  Resp: 18 16 18 16   Temp:      TempSrc:      SpO2: 100% 99% 97% 93%  Weight:      Height:       Eyes: PERRL, lids and conjunctivae normal ENMT: Mucous membranes are moist. Posterior pharynx clear of any exudate or lesions.Normal dentition.  Neck: normal, supple, no masses, no thyromegaly Respiratory: clear to auscultation bilaterally, no wheezing, no crackles. Normal respiratory effort. No accessory muscle use.  Cardiovascular: Regular rate and rhythm, no murmurs / rubs / gallops. No extremity edema. 2+ pedal pulses. No carotid bruits.  Abdomen: no tenderness, no masses palpated. No hepatosplenomegaly. Bowel sounds positive.  Musculoskeletal: no clubbing / cyanosis. No joint deformity upper and lower extremities. Good ROM, no contractures. Normal muscle tone.  Skin: no rashes, lesions, ulcers. No induration Neurologic: CN 2-12 grossly intact. Sensation intact, DTR normal. Strength 5/5 in all 4.  Psychiatric: Normal judgment and insight. Alert and oriented x 3. Normal mood.   (Anything < 9 systems with 2 bullets each down codes to level 1) (If patient refuses exam can't bill higher level) (Make sure to document decubitus ulcers present on admission -- if possible -- and whether patient has chronic indwelling catheter at time of admission)  Labs on Admission: I have personally reviewed following labs and imaging studies  CBC: Recent Labs  Lab 01/01/21 1742  WBC 6.9  NEUTROABS 4.8  HGB 12.6  HCT 39.3  MCV 101.3*  PLT 148*   Basic Metabolic Panel: Recent Labs  Lab 01/01/21 1906  NA 140  K 3.7  CL 115*  CO2 18*  GLUCOSE 79  BUN 10  CREATININE 0.54  CALCIUM 6.7*   GFR: Estimated Creatinine Clearance: 43.7 mL/min (by C-G formula based  on SCr of 0.54 mg/dL). Liver Function Tests: Recent Labs  Lab 01/01/21 1906  AST 15  ALT 7  ALKPHOS 63  BILITOT 1.0  PROT 5.1*  ALBUMIN 2.4*   No results for input(s): LIPASE, AMYLASE in the last 168 hours. No results for input(s): AMMONIA in the last 168 hours. Coagulation Profile: Recent Labs  Lab 01/01/21 2338  INR 1.1   Cardiac Enzymes: No results for input(s): CKTOTAL, CKMB, CKMBINDEX, TROPONINI in the last 168 hours. BNP (last 3 results) No results  for input(s): PROBNP in the last 8760 hours. HbA1C: No results for input(s): HGBA1C in the last 72 hours. CBG: No results for input(s): GLUCAP in the last 168 hours. Lipid Profile: No results for input(s): CHOL, HDL, LDLCALC, TRIG, CHOLHDL, LDLDIRECT in the last 72 hours. Thyroid Function Tests: No results for input(s): TSH, T4TOTAL, FREET4, T3FREE, THYROIDAB in the last 72 hours. Anemia Panel: No results for input(s): VITAMINB12, FOLATE, FERRITIN, TIBC, IRON, RETICCTPCT in the last 72 hours. Urine analysis:    Component Value Date/Time   COLORURINE AMBER (A) 01/01/2021 2250   APPEARANCEUR CLOUDY (A) 01/01/2021 2250   LABSPEC 1.015 01/01/2021 2250   PHURINE 6.0 01/01/2021 2250   GLUCOSEU NEGATIVE 01/01/2021 2250   HGBUR MODERATE (A) 01/01/2021 2250   BILIRUBINUR NEGATIVE 01/01/2021 2250   BILIRUBINUR neg 07/18/2014 1320   KETONESUR NEGATIVE 01/01/2021 2250   PROTEINUR 30 (A) 01/01/2021 2250   UROBILINOGEN 0.2 07/18/2014 1320   UROBILINOGEN 0.2 06/14/2014 1236   NITRITE NEGATIVE 01/01/2021 2250   LEUKOCYTESUR LARGE (A) 01/01/2021 2250    Radiological Exams on Admission: No results found.  EKG: Independently reviewed. NSR, LVH, increased PR interval  Assessment/Plan Active Problems:   Acute lower UTI   Pulmonary embolism (HCC)   DM2 (diabetes mellitus, type 2) (HCC)   Essential hypertension   General weakness    1. PE - unknown origin. Do not know patients level of activity. Plan Full dose heparin  and then transition to NOAC for long term tx.  LE dopplers  2. UTI - patient just finished treatment for UTI with unknown Abx. U/A markedly positive.  Plan Send urine for Culture  IV Cipro 400 mg bid pending culture results or further information about recent tx  3. DM2 - on no medication. Last A1C 4.8% December '21 Plan Low carb diet  A1C  Sliding scale coverage  4. HTN- continue home meds Plan Hydralazine oral q 6 prn SBP > 170  5.  Dementia - on no medication. Did get agitated in ED requiring Haldol Plan Haldol PO/IM for severe agitation  6. Code status - could not reach family. Reviewed palliative care note from home visit 12/03/20 which listed patient as DNR.   DVT prophylaxis: full dose heparin  Code Status: DNR  Family Communication: attemp[ted to call niece, listed as emergency contact. Left message  Disposition Plan: TBD  Consults called: none (with names) Admission status: Inpatient    Illene Regulus MD [facility] Pager [phone]  If 7PM-7AM, please contact night-coverage [email or web address] Password [facility]  01/02/2021, 1:36 AM

## 2021-01-02 NOTE — Progress Notes (Signed)
Subjective: Patient admitted this morning, see detailed H&P by Dr. Debby Bud 87 year old female with a history of CAD, diabetes mellitus type 2, hyperlipidemia, dementia, glaucoma, history of CVA who was straining on commode when she had syncopal episode.  In the ED CTA chest showed multiple subsegmental PEs, patient started on heparin.  Lower extremity venous Dopplers ordered.  Patient also found to have UTI, started on IV Cipro for UTI.  Vitals:   01/02/21 0521 01/02/21 1152  BP:  (!) 111/94  Pulse: 89 76  Resp:  17  Temp:  97.7 F (36.5 C)  SpO2:  100%      A/P  Bilateral subsegmental PEs-continue IV heparin.  Follow-up lower extremity venous duplex.  Will order echocardiogram.  She is currently not requiring oxygen.  O2 sats 100% on room air.   UTI-patient just completed treatment for UTI.  UA was positive, started on IV Rocephin.  Follow urine culture results.  Hypertension-continue hydralazine as needed  Dementia-patient is not on medications.  Continue Haldol as needed for agitation.   Meredeth Ide [facility] [phone]

## 2021-01-02 NOTE — Progress Notes (Addendum)
ANTICOAGULATION CONSULT NOTE  Pharmacy Consult for Heparin Indication: pulmonary embolus  Allergies  Allergen Reactions  . Clonidine Derivatives Other (See Comments)    Pass out; makes her blood pressure too low  . Milk-Related Compounds Other (See Comments)    Lactose intolerant.      Patient Measurements: Height: 5\' 5"  (165.1 cm) Weight: 66.7 kg (147 lb) IBW/kg (Calculated) : 57 Heparin Dosing Weight: TBW  Vital Signs: Temp: 97.7 F (36.5 C) (04/16 1152) Temp Source: Oral (04/16 1152) BP: 111/94 (04/16 1152) Pulse Rate: 76 (04/16 1152)  Labs: Recent Labs    01/01/21 1742 01/01/21 1906 01/01/21 2338 01/02/21 0548 01/02/21 1053  HGB 12.6  --   --  15.0  --   HCT 39.3  --   --  44.3  --   PLT 148*  --   --  219  --   APTT  --   --  27  --   --   LABPROT  --   --  13.9  --   --   INR  --   --  1.1  --   --   HEPARINUNFRC  --   --   --  0.33 0.52  CREATININE  --  0.54  --   --   --   TROPONINIHS  --  16  --   --   --     Estimated Creatinine Clearance: 43.7 mL/min (by C-G formula based on SCr of 0.54 mg/dL).  Medications:  No anticoagulation PTA  Assessment:  87 yr female presented to ED due to syncopal event  CTAngio = + multiple segmental and subsegmental bilateral PE  Pharmacy consulted to dose IV heparin  Today, 01/02/2021:  CBC stable WNL  First heparin level drawn 3 hr early with AM labs, so not useful  Repeat heparin level (first true level) therapeutic on 1000 units/hr  No bleeding or infusion issues per RN  Goal of Therapy:  Heparin level 0.3-0.7 units/ml Monitor platelets by anticoagulation protocol: Yes   Plan:   Continue heparin infusion at 1000 units/hr  Recheck confirmatory heparin level in 8 hr  Daily heparin level and CBC  Monitor for s/s bleeding or worsening thrombosis  F/u plans for long-term anticoagulation  Jemery Stacey A, PharmD 01/02/2021,1:21 PM

## 2021-01-02 NOTE — Progress Notes (Signed)
The niece of patient [name] called for an update on patient. I informed her of patient's status. Informed her that BP is elevated at the moment and will administer BP meds. Informed her that patient is in bed, resting.

## 2021-01-02 NOTE — Progress Notes (Signed)
ANTICOAGULATION CONSULT NOTE Pharmacy Consult for Heparin Indication: pulmonary embolus  See previous note from Bernadene Person, PharmD for full details.  Pharmacy consulted to dose IV heparin for bilateral PEs.  Heparin level supratherapeutic (0.78) on 1000 units/hr.  No bleeding or complications reported per RN.  Plan: - decrease heparin to 900 units/hr - recheck heparin level in 8hrs  Loralee Pacas, PharmD, BCPS 01/02/2021 7:05 PM Pharmacy: [phone]

## 2021-01-02 NOTE — ED Notes (Signed)
Mittens placed on pt to prevent her from pulling out IV and pulling monitoring cords.

## 2021-01-03 ENCOUNTER — Inpatient Hospital Stay (HOSPITAL_COMMUNITY): Payer: Medicare Other

## 2021-01-03 DIAGNOSIS — I2699 Other pulmonary embolism without acute cor pulmonale: Secondary | ICD-10-CM | POA: Diagnosis not present

## 2021-01-03 DIAGNOSIS — Z86718 Personal history of other venous thrombosis and embolism: Secondary | ICD-10-CM | POA: Diagnosis not present

## 2021-01-03 DIAGNOSIS — I2694 Multiple subsegmental pulmonary emboli without acute cor pulmonale: Secondary | ICD-10-CM | POA: Diagnosis not present

## 2021-01-03 DIAGNOSIS — I2602 Saddle embolus of pulmonary artery with acute cor pulmonale: Secondary | ICD-10-CM

## 2021-01-03 LAB — HEPARIN LEVEL (UNFRACTIONATED)
Heparin Unfractionated: 0.79 IU/mL — ABNORMAL HIGH (ref 0.30–0.70)
Heparin Unfractionated: 0.64 IU/mL (ref 0.30–0.70)

## 2021-01-03 LAB — ECHOCARDIOGRAM COMPLETE
AR max vel: 1.9 cm2
AV Area VTI: 1.63 cm2
AV Area mean vel: 1.97 cm2
AV Mean grad: 3 mmHg
AV Peak grad: 5.8 mmHg
Ao pk vel: 1.21 m/s
Area-P 1/2: 2.87 cm2
Height: 65 in
S' Lateral: 2.6 cm
Weight: 2352 oz

## 2021-01-03 LAB — CBC
HCT: 43.9 % (ref 36.0–46.0)
Hemoglobin: 14.4 g/dL (ref 12.0–15.0)
MCH: 32.4 pg (ref 26.0–34.0)
MCHC: 32.8 g/dL (ref 30.0–36.0)
MCV: 98.7 fL (ref 80.0–100.0)
Platelets: 194 10*3/uL (ref 150–400)
RBC: 4.45 MIL/uL (ref 3.87–5.11)
RDW: 12.8 % (ref 11.5–15.5)
WBC: 6.4 10*3/uL (ref 4.0–10.5)
nRBC: 0 % (ref 0.0–0.2)

## 2021-01-03 LAB — GLUCOSE, CAPILLARY
Glucose-Capillary: 96 mg/dL (ref 70–99)
Glucose-Capillary: 111 mg/dL — ABNORMAL HIGH (ref 70–99)
Glucose-Capillary: 91 mg/dL (ref 70–99)
Glucose-Capillary: 123 mg/dL — ABNORMAL HIGH (ref 70–99)

## 2021-01-03 LAB — COMPREHENSIVE METABOLIC PANEL
ALT: 7 U/L (ref 0–44)
AST: 21 U/L (ref 15–41)
Albumin: 2.9 g/dL — ABNORMAL LOW (ref 3.5–5.0)
Alkaline Phosphatase: 69 U/L (ref 38–126)
Anion gap: 9 (ref 5–15)
BUN: 12 mg/dL (ref 8–23)
CO2: 23 mmol/L (ref 22–32)
Calcium: 8.7 mg/dL — ABNORMAL LOW (ref 8.9–10.3)
Chloride: 106 mmol/L (ref 98–111)
Creatinine, Ser: 0.73 mg/dL (ref 0.44–1.00)
GFR, Estimated: >60 mL/min (ref >60)
Glucose, Bld: 92 mg/dL (ref 70–99)
Potassium: 4.5 mmol/L (ref 3.5–5.1)
Sodium: 138 mmol/L (ref 135–145)
Total Bilirubin: 0.8 mg/dL (ref 0.3–1.2)
Total Protein: 6.2 g/dL — ABNORMAL LOW (ref 6.5–8.1)

## 2021-01-03 LAB — URINE CULTURE: Culture: >=100000 — AB

## 2021-01-03 MED ORDER — HEPARIN (PORCINE) 25000 UT/250ML-% IV SOLN
800.0000 [IU]/h | INTRAVENOUS | Status: DC
  Filled 2021-01-03: qty 250

## 2021-01-03 NOTE — Progress Notes (Signed)
Lower extremity venous bilateral study completed.  Preliminary results relayed to Lama, MD.  See CV Proc for preliminary results report.   Karcyn Menn, RDMS, RVT   

## 2021-01-03 NOTE — Progress Notes (Signed)
[facility]  PROGRESS NOTE  Kaitlin Smith ZOX:[id number] DOB: May 18, 1930 DOA: 01/01/2021 PCP: Alysia Penna, MD   Brief HPI:   87 year old female with a history of CAD, diabetes mellitus type 2, hyperlipidemia, dementia, glaucoma, history of CVA who was straining on commode when she had syncopal episode.  In the ED CTA chest showed multiple subsegmental PEs, patient started on heparin.  Lower extremity venous Dopplers ordered.  Patient also found to have UTI, started on IV Cipro for UTI.    Subjective   Patient seen and examined, lower extremity venous duplex shows finding consistent with age-indeterminate deep vein thrombosis involving right popliteal vein, and age indeterminate deep vein thrombosis involving left femoral vein.  Echocardiogram shows no right ventricular strain.  It shows grade 1 diastolic dysfunction.   Assessment/Plan:     1. Bilateral subsegmental PEs-patient started on IV heparin.  Lower extremity venous duplex shows age-indeterminate bilateral DVT.  Echocardiogram shows no right heart strain.  She is currently not requiring oxygen.  We will switch her to NOAC's in a.m. and likely discharge home if patient remains stable. 2. UTI-patient just completed treatment for UTI, UA was found to be abnormal.  Patient started on IV Rocephin.  Urine culture growing multiple species.  Will discontinue IV Rocephin. 3. Hypertension-continue Coreg, losartan.  Continue as needed hydralazine 4. Dementia-patient is not on medications, continue Haldol as needed for agitation.   Scheduled medications:   . carvedilol  12.5 mg Oral BID WC  . insulin aspart  0-9 Units Subcutaneous TID WC  . latanoprost  1 drop Both Eyes QHS  . losartan  50 mg Oral Daily  . pantoprazole  40 mg Oral Daily  . tamsulosin  0.4 mg Oral Daily         Data Reviewed:   CBG:  Recent Labs  Lab 01/02/21 1148 01/02/21 1712 01/02/21 2119 01/03/21 0823 01/03/21 1154  GLUCAP 93 133* 103* 96 111*     SpO2: 100 %    Vitals:   01/02/21 1525 01/02/21 2000 01/03/21 0522 01/03/21 1506  BP: 132/80 130/90 (!) 197/91 (!) 153/74  Pulse: 74 80 82 71  Resp: Temp: 97.8 F (36.6 C) 98.7 F (37.1 C) 98.1 F (36.7 C) 98.5 F (36.9 C)  TempSrc: Oral Oral Oral Oral  SpO2: 99% 98% 100% 100%  Weight:      Height:         Intake/Output Summary (Last 24 hours) at 01/03/2021 1547 Last data filed at 01/03/2021 1240 Gross per 24 hour  Intake 730 ml  Output 1000 ml  Net -270 ml    04/15 1901 - 04/17 0700 In: 374.2 [P.O.:120; I.V.:250.4] Out: 1000 [Urine:1000]  Filed Weights   01/01/21 1749  Weight: 66.7 kg    CBC:  Recent Labs  Lab 01/01/21 1742 01/02/21 0548 01/03/21 0434  WBC 6.9 7.4 6.4  HGB 12.6 15.0 14.4  HCT 39.3 44.3 43.9  PLT 148* 219 194  MCV 101.3* 94.7 98.7  MCH 32.5 32.1 32.4  MCHC 32.1 33.9 32.8  RDW 12.7 12.7 12.8  LYMPHSABS 1.4 2.4  --   MONOABS 0.4 0.7  --   EOSABS 0.3 0.3  --   BASOSABS 0.0 0.0  --     Complete metabolic panel:  Recent Labs  Lab 01/01/21 1906 01/01/21 2338 01/03/21 0434  NA 140  --  138  K 3.7  --  4.5  CL 115*  --  106  CO2 18*  --  23  GLUCOSE 79  --  92  BUN 10  --  12  CREATININE 0.54  --  0.73  CALCIUM 6.7*  --  8.7*  AST 15  --  21  ALT 7  --  7  ALKPHOS 63  --  69  BILITOT 1.0  --  0.8  ALBUMIN 2.4*  --  2.9*  INR  --  1.1  --     No results for input(s): LIPASE, AMYLASE in the last 168 hours.  Recent Labs  Lab 01/01/21 2334  SARSCOV2NAA NEGATIVE    ------------------------------------------------------------------------------------------------------------------ No results for input(s): CHOL, HDL, LDLCALC, TRIG, CHOLHDL, LDLDIRECT in the last 72 hours.  Lab Results  Component Value Date   HGBA1C 4.8 09/01/2020   ------------------------------------------------------------------------------------------------------------------ No results for input(s): TSH, T4TOTAL, T3FREE, THYROIDAB in  the last 72 hours.  Invalid input(s): FREET3 ------------------------------------------------------------------------------------------------------------------ No results for input(s): VITAMINB12, FOLATE, FERRITIN, TIBC, IRON, RETICCTPCT in the last 72 hours.  Coagulation profile  Recent Labs  Lab 01/01/21 2338  INR 1.1    No results for input(s): DDIMER in the last 72 hours.  Cardiac Enzymes  No results for input(s): CKMB, TROPONINI, MYOGLOBIN in the last 168 hours.  Invalid input(s): CK ------------------------------------------------------------------------------------------------------------------    Component Value Date/Time   BNP 1,007.4 (H) 09/08/2020 2125     Antibiotics: Anti-infectives (From admission, onward)   Start     Dose/Rate Route Frequency Ordered Stop   01/02/21 1800  cefTRIAXone (ROCEPHIN) 1 g in sodium chloride 0.9 % 100 mL IVPB        1 g 200 mL/hr over 30 Minutes Intravenous Every 24 hours 01/02/21 1016     01/02/21 0200  ciprofloxacin (CIPRO) IVPB 400 mg  Status:  Discontinued        400 mg 200 mL/hr over 60 Minutes Intravenous Every 12 hours 01/02/21 0134 01/02/21 1016       Radiology Reports  ECHOCARDIOGRAM COMPLETE  Result Date: 01/03/2021    ECHOCARDIOGRAM REPORT   Patient Name:   Kaitlin Smith Date of Exam: 01/03/2021 Medical Rec #:  [id number]    Height:       65.0 in Accession #:    [id number]   Weight:       147.0 lb Date of Birth:  [birth year]    BSA:          1.736 m Patient Age:    89 years     BP:           197/91 mmHg Patient Gender: F            HR:           91 bpm. Exam Location:  Inpatient Procedure: 2D Echo, Cardiac Doppler and Color Doppler Indications:    I26.02 Pulmonary embolus  History:        Patient has prior history of Echocardiogram examinations, most                 recent 09/09/2020. Previous Myocardial Infarction and CAD, COPD;                 Risk Factors:Hypertension, Diabetes and Dyslipidemia. COVID-19.  Sonographer:     Elmarie Shiley Dance Referring Phys: Gomez Cleverly Shylo Zamor  Sonographer Comments: No subcostal window. IMPRESSIONS  1. Left ventricular ejection fraction, by estimation, is 65 to 70%. The left ventricle has normal function. The left ventricle has no regional wall motion abnormalities. There is moderate left ventricular hypertrophy. Left ventricular diastolic parameters are consistent  with Grade I diastolic dysfunction (impaired relaxation).  2. Right ventricular systolic function is normal. The right ventricular size is normal.  3. The mitral valve is normal in structure. No evidence of mitral valve regurgitation. No evidence of mitral stenosis.  4. The aortic valve is tricuspid. There is moderate calcification of the aortic valve. There is moderate thickening of the aortic valve. Aortic valve regurgitation is not visualized. Mild to moderate aortic valve sclerosis/calcification is present, without any evidence of aortic stenosis. FINDINGS  Left Ventricle: LVOT and AV Dopplers appear off angle and inaccurate. Left ventricular ejection fraction, by estimation, is 65 to 70%. The left ventricle has normal function. The left ventricle has no regional wall motion abnormalities. The left ventricular internal cavity size was normal in size. There is moderate left ventricular hypertrophy. Left ventricular diastolic parameters are consistent with Grade I diastolic dysfunction (impaired relaxation). Normal left ventricular filling pressure. Right Ventricle: The right ventricular size is normal. Right vetricular wall thickness was not assessed. Right ventricular systolic function is normal. Left Atrium: Left atrial size was normal in size. Right Atrium: Right atrial size was normal in size. Pericardium: There is no evidence of pericardial effusion. Mitral Valve: The mitral valve is normal in structure. No evidence of mitral valve regurgitation. No evidence of mitral valve stenosis. Tricuspid Valve: The tricuspid valve is normal in  structure. Tricuspid valve regurgitation is not demonstrated. No evidence of tricuspid stenosis. Aortic Valve: The aortic valve is tricuspid. There is moderate calcification of the aortic valve. There is moderate thickening of the aortic valve. There is moderate aortic valve annular calcification. Aortic valve regurgitation is not visualized. Mild to moderate aortic valve sclerosis/calcification is present, without any evidence of aortic stenosis. Aortic valve mean gradient measures 3.0 mmHg. Aortic valve peak gradient measures 5.8 mmHg. Aortic valve area, by VTI measures 1.63 cm. Pulmonic Valve: The pulmonic valve was not well visualized. Pulmonic valve regurgitation is not visualized. No evidence of pulmonic stenosis. Aorta: The aortic root is normal in size and structure. IAS/Shunts: The interatrial septum was not well visualized.  LEFT VENTRICLE PLAX 2D LVIDd:         3.80 cm  Diastology LVIDs:         2.60 cm  LV e' medial:    3.92 cm/s LV PW:         1.30 cm  LV E/e' medial:  12.9 LV IVS:        1.20 cm  LV e' lateral:   4.24 cm/s LVOT diam:     2.10 cm  LV E/e' lateral: 12.0 LV SV:         38 LV SV Index:   22 LVOT Area:     3.46 cm  RIGHT VENTRICLE RV Basal diam:  1.60 cm RV S prime:     10.00 cm/s LEFT ATRIUM             Index       RIGHT ATRIUM          Index LA diam:        3.90 cm 2.25 cm/m  RA Area:     5.91 cm LA Vol (A2C):   28.2 ml 16.25 ml/m RA Volume:   8.06 ml  4.64 ml/m LA Vol (A4C):   22.3 ml 12.85 ml/m LA Biplane Vol: 25.5 ml 14.69 ml/m  AORTIC VALVE AV Area (Vmax):    1.90 cm AV Area (Vmean):   1.97 cm AV Area (VTI):  1.63 cm AV Vmax:           120.67 cm/s AV Vmean:          81.667 cm/s AV VTI:            0.232 m AV Peak Grad:      5.8 mmHg AV Mean Grad:      3.0 mmHg LVOT Vmax:         66.20 cm/s LVOT Vmean:        46.400 cm/s LVOT VTI:          0.109 m LVOT/AV VTI ratio: 0.47  AORTA Ao Root diam: 3.20 cm Ao Asc diam:  3.40 cm MITRAL VALVE MV Area (PHT): 2.87 cm    SHUNTS MV  Decel Time: 264 msec    Systemic VTI:  0.11 m MV E velocity: 50.70 cm/s  Systemic Diam: 2.10 cm MV A velocity: 96.10 cm/s MV E/A ratio:  0.53 Dina Rich MD Electronically signed by Dina Rich MD Signature Date/Time: 01/03/2021/11:21:38 AM    Final    CT Angio Chest/Abd/Pel for Dissection W and/or W/WO  Result Date: 01/01/2021 CLINICAL DATA:  Syncopal episode, loss of consciousness EXAM: CT ANGIOGRAPHY CHEST, ABDOMEN AND PELVIS TECHNIQUE: Non-contrast CT of the chest was initially obtained. Multidetector CT imaging through the chest, abdomen and pelvis was performed using the standard protocol during bolus administration of intravenous contrast. Multiplanar reconstructed images and MIPs were obtained and reviewed to evaluate the vascular anatomy. CONTRAST:  OMNIPAQUE IOHEXOL 350 MG/ML SOLN COMPARISON:  Chest radiograph 09/11/2020, CT angiography, chest, abdomen and pelvis 11/14/2018 FINDINGS: CTA CHEST FINDINGS Cardiovascular: Precontrast imaging without hyperdense mural thickening or plaque displacement to suggest intramural hematoma. Postcontrast administration there is satisfactory opacification of the arterial vasculature. Redemonstration of the extensive calcified noncalcified plaque throughout the thoracic aorta. Irregular luminal plaque projections resulting in a curvilinear filling defect seen at the level of the aortic arch are unchanged from prior (6/20). Additional site of penetrating atherosclerotic ulcer measuring 1.5 cm at the base and to 10 mm in depth, also unchanged from the comparison study (6/62). A distal thoracic aortic curvilinear filling defect seen on comparison imaging is no longer visible. No other acute or significant luminal abnormality of the thoracic aorta. Normal 3 vessel branching of the aortic arch is seen. Calcified and noncalcified atheromatous plaque is present in the proximal great vessels resulting in at most mild luminal narrowing, difficult to fully assess  this nondedicated exam. Central pulmonary arteries are top-normal caliber. There is extensive filling defect seen extending from the distal left main pulmonary artery into the left upper lobar and segmental branches of the lingula. Additional lobar to segmental filling defects are present right upper and middle lobes as well. Assessment for right heart strain is limited by a chronic cardiomegaly, present on prior. Pulmonary artery caliber is similar to comparison as well. Coronary artery calcifications are seen. No pericardial effusion. Mediastinum/Nodes: Heterogeneous, enlarged multinodular thyroid gland. Thoracic inlet otherwise unremarkable. No concerning mediastinal, hilar or axillary adenopathy. No acute abnormality of the trachea. Fluid-filled appearance of the distal thoracic esophagus,. Lungs/Pleura: Extensive ground-glass and consolidative opacity present in the dependent portion of the lungs with at least partially attributable to atelectasis though underlying airspace disease and/or aspiration may be present. Some mild bronchiectatic changes are present the in the lung bases as well. Few scattered calcified granulomata. Mild paraseptal emphysema. Some pulmonary vascular redistribution and septal thickening is present. No concerning pulmonary nodules or masses though evaluation limited by underlying  opacity and respiratory motion artifact. Musculoskeletal: The osseous structures appear diffusely demineralized which may limit detection of small or nondisplaced fractures. Multilevel degenerative changes are present in the imaged portions of the spine. Degenerative changes in the shoulders as well. No acute osseous abnormality of the included portions of the upper extremities. Review of the MIP images confirms the above findings. CTA ABDOMEN AND PELVIS FINDINGS VASCULAR Aorta: Extensive aortic atherosclerosis within mixture of calcified noncalcified atherosclerotic plaque. No evidence of aneurysm, dissection  or vasculitis. Celiac: Mild ostial plaque narrowing. Otherwise patent. No aneurysm, dissection, vasculitis or significant stenosis. Typical branching pattern. SMA: Mild-to-moderate ostial and proximal vessel plaque narrowing. No other significant stenosis or occlusion. No aneurysm or dissection. No features of vasculitis. Renals: Single renal arteries bilaterally. Moderate bilateral ostial and proximal vessel plaque narrowing. Both renal arteries are patent without evidence of aneurysm, dissection, vasculitis, fibromuscular dysplasia or significant stenosis. IMA: Severe stenosis or occlusion of the IMA at the ostium opacification of the vessel possibly supplied by left colic back fill collateral flow. Inflow: Extensive calcified and noncalcified atherosclerotic plaque throughout the inflow vasculature wrist results in segmental narrowing of the left internal iliac and a fairly proximal occlusion of the right internal iliac prior to the anterior and posterior divisions (6/148). Included portions of the proximal outflow vessels demonstrate extensive atheromatous plaque as well resulting in some moderate plaque narrowing of the bilateral common femorals as well as segmental narrowing of the superficial and deep femoral artery on the right, superficial femoral artery on the left and a proximal tapering of the left deep superficial femoral artery just beyond the bifurcation (6/205). Veins: No obvious venous abnormality within the limitations of this arterial phase study. Review of the MIP images confirms the above findings. NON-VASCULAR Hepatobiliary: Diffuse hepatic hypoattenuation compatible with hepatic steatosis. Mild sparing along the gallbladder fossa. Few small subcentimeter hypoattenuating foci are too small to characterize on CT imaging. Gallbladder contains partially calcified gallstones within the gallbladder neck/proximal cystic duct (6/82). No pericholecystic fluid or inflammation. No intraductal gallstones.  No biliary ductal dilatation. Pancreas: Moderate pancreatic atrophy. No pancreatic ductal dilatation or surrounding inflammatory changes. Spleen: Normal in size. No concerning splenic lesions. Adrenals/Urinary Tract: Lobular thickening of the adrenal glands could reflect some senescent adrenal hyperplasia without concerning dominant nodules. Kidneys enhance symmetrically and uniformly. Fluid attenuation cyst seen arising from the lower pole right kidney (6/111) measuring up to 13 cm in size. No concerning renal mass. No obstructive uropathy distended, bizarre configuration of the bladder with several presumed bladder diverticula seen posteriorly. No visible bladder calculi or debris. Mural calcifications noted along the bladder wall. Stomach/Bowel: Distal esophagus, stomach and duodenal sweep are unremarkable. No small bowel wall thickening or dilatation. No evidence of obstruction. A normal appendix is visualized. No proximal colonic thickening or dilatation. Moderate distention of the rectosigmoid by an inspissated rectal stool ball with mild mural thickening and surrounding perirectal hazy fat stranding. Lymphatic: No suspicious or enlarged lymph nodes in the included lymphatic chains. Reproductive: Anteverted uterus with multiple calcified uterine fibroids displaced slightly rightward in a configuration similar to comparison imaging. No concerning adnexal mass or lesion. Other: Hazy stranding in the presacral fat, similar to priors albeit with some additional perirectal fat stranding. Musculoskeletal: The osseous structures appear diffusely demineralized which may limit detection of small or nondisplaced fractures. No acute fracture or traumatic malalignment. Multilevel degenerative changes are present in the imaged portions of the spine. Multilevel Schmorl's node formations noted as well. No concerning lytic or blastic lesions.  Additional degenerative changes in the hips and pelvis. Review of the MIP images  confirms the above findings. IMPRESSION: Vascular: 1. Extensive bilateral pulmonary emboli extending from the distal left main pulmonary artery into the left upper lobar and segmental branches of the lingula. Additional lobar to segmental filling defects are present right upper and middle lobes as well. Assessment for right heart strain is limited by a chronic cardiomegaly, consider echocardiography as clinically warranted. 2.  Atherosclerotic plaque within the normal caliber aorta. 3. Curvilinear filling defect at the level of the aortic arch is stable. Previously seen distal thoracic aortic curvilinear filling defect is no longer present. Could reflect migration or stabilization. 4. Stable penetrating atherosclerotic ulcer towards the diaphragmatic hiatus/distal thoracic aorta. 5. Mild ostial plaque narrowing of the SMA and celiac, moderate narrowing of the bilateral renal arteries and severe narrowing or occlusion of the IMA origin. Additional plaque occlusions seen in the right internal iliac and left superficial femoral artery with additional mild-to-moderate segmental luminal stenoses the inflow and proximal outflow vasculature as detailed above. Nonvascular: 1. Extensive ground-glass and consolidative opacity in the dependent portion of the lungs with at least partially attributable to atelectasis though underlying airspace disease and/or aspiration may be present particularly given a fluid-filled appearance of the distal thoracic esophagus. 2. Features of fecal impaction and/or stercoral colitis with moderate distention of the rectosigmoid by an inspissated rectal stool ball with mild mural thickening and surrounding perirectal hazy fat stranding. 3. Cholelithiasis without evidence for acute cholecystitis. 4. Hepatic steatosis. 5. Fibroid uterus. 6. Heterogeneous, enlarged multinodular thyroid gland. Recommend thyroid ultrasound if not previously performed (ref: J Am Coll Radiol. 2015 Feb;12(2): 143-50).  These results were called by telephone at the time of interpretation on 01/01/2021 at 11:33 pm to provider Dr. Pilar Plate, Who verbally acknowledged these results. Electronically Signed   By: Kreg Shropshire M.D.   On: 01/01/2021 23:37   VAS [place] LOWER EXTREMITY VENOUS (DVT)  Result Date: 01/03/2021  Lower Venous DVT Study Indications: Pulmonary embolism, and history of DVT.  Limitations: Patient unwilling to cooperate with positioning. Overlying shadowing for overlying arterial calcification. Comparison Study: 09-02-2020 Bilateral lower extremity venous was limited, but                   positive for acute DVT of the RT popliteal. Performing Technologist: Jean Rosenthal RDMS,RVT  Examination Guidelines: A complete evaluation includes B-mode imaging, spectral Doppler, color Doppler, and power Doppler as needed of all accessible portions of each vessel. Bilateral testing is considered an integral part of a complete examination. Limited examinations for reoccurring indications may be performed as noted. The reflux portion of the exam is performed with the patient in reverse Trendelenburg.  +---------+---------------+---------+-----------+----------+-----------------+ RIGHT    CompressibilityPhasicitySpontaneityPropertiesThrombus Aging    +---------+---------------+---------+-----------+----------+-----------------+ CFV      Full           Yes      Yes                                    +---------+---------------+---------+-----------+----------+-----------------+ SFJ      Full                                                           +---------+---------------+---------+-----------+----------+-----------------+  FV Prox  Full                                                           +---------+---------------+---------+-----------+----------+-----------------+ FV Mid   Full                                                            +---------+---------------+---------+-----------+----------+-----------------+ FV DistalFull           Yes      Yes                                    +---------+---------------+---------+-----------+----------+-----------------+ PFV      Full                                                           +---------+---------------+---------+-----------+----------+-----------------+ POP      None           No       No                   Age Indeterminate +---------+---------------+---------+-----------+----------+-----------------+ PTV                                                   Not visualized    +---------+---------------+---------+-----------+----------+-----------------+ PERO                                                  Not visualized    +---------+---------------+---------+-----------+----------+-----------------+   +---------+---------------+---------+-----------+----------+-------------------+ LEFT     CompressibilityPhasicitySpontaneityPropertiesThrombus Aging      +---------+---------------+---------+-----------+----------+-------------------+ CFV      Full           Yes      Yes                                      +---------+---------------+---------+-----------+----------+-------------------+ SFJ      Full                                                             +---------+---------------+---------+-----------+----------+-------------------+ FV Prox  Partial        Yes      Yes                  Age Indeterminate   +---------+---------------+---------+-----------+----------+-------------------+ FV Mid   Partial  No       No                                       +---------+---------------+---------+-----------+----------+-------------------+ FV DistalFull                                                             +---------+---------------+---------+-----------+----------+-------------------+ PFV      Full                                                              +---------+---------------+---------+-----------+----------+-------------------+ POP                                                   Unable to visualize                                                       due to patient                                                            positioning         +---------+---------------+---------+-----------+----------+-------------------+ PTV                     Yes      Yes                                      +---------+---------------+---------+-----------+----------+-------------------+ PERO                                                  Not well visualized +---------+---------------+---------+-----------+----------+-------------------+    Summary: RIGHT: - Findings consistent with age indeterminate deep vein thrombosis involving the right popliteal vein. - No cystic structure found in the popliteal fossa.  LEFT: - Findings consistent with age indeterminate deep vein thrombosis involving the left femoral vein.  *See table(s) above for measurements and observations.    Preliminary       DVT prophylaxis: Patient is on heparin full dose  Code Status: DNR  Family Communication: Discussed with patient's niece on phone on 01/02/2021   Consultants:    Procedures:      Objective    Physical Examination:    General-appears in no acute distress  Heart-S1-S2, regular, no murmur auscultated  Lungs-clear to auscultation  bilaterally, no wheezing or crackles auscultated  Abdomen-soft, nontender, no organomegaly  Extremities-no edema in the lower extremities  Neuro-alert, oriented x3, intact insight and judgment   Status is: Inpatient  Dispo: The patient is from: Home              Anticipated d/c is to: Home              Anticipated d/c date is: 01/04/2021              Patient currently not stable for discharge  Barrier to discharge-pending  evaluation for pulmonary embolism  COVID-19 Labs  No results for input(s): DDIMER, FERRITIN, LDH, CRP in the last 72 hours.  Lab Results  Component Value Date   SARSCOV2NAA NEGATIVE 01/01/2021   SARSCOV2NAA POSITIVE (A) 08/31/2020    Microbiology  Recent Results (from the past 240 hour(s))  Urine culture     Status: Abnormal   Collection Time: 01/01/21 10:50 PM   Specimen: Urine, Random  Result Value Ref Range Status   Specimen Description   Final    URINE, RANDOM Performed at [facility], [address] [place], [place] [address]    Special Requests   Final    NONE Performed at [facility], [address] [place], [place] [address]    Culture (A)  Final    >=100,000 COLONIES/mL MULTIPLE SPECIES PRESENT, SUGGEST RECOLLECTION   Report Status 01/03/2021 FINAL  Final  Resp Panel by RT-PCR (Flu A&B, Covid) Nasopharyngeal Swab     Status: None   Collection Time: 01/01/21 11:34 PM   Specimen: Nasopharyngeal Swab; Nasopharyngeal(NP) swabs in vial transport medium  Result Value Ref Range Status   SARS Coronavirus 2 by RT PCR NEGATIVE NEGATIVE Final    Comment: (NOTE) SARS-CoV-2 target nucleic acids are NOT DETECTED.  The SARS-CoV-2 RNA is generally detectable in upper respiratory specimens during the acute phase of infection. The lowest concentration of SARS-CoV-2 viral copies this assay can detect is 138 copies/mL. A negative result does not preclude SARS-Cov-2 infection and should not be used as the sole basis for treatment or other patient management decisions. A negative result may occur with  improper specimen collection/handling, submission of specimen other than nasopharyngeal swab, presence of viral mutation(s) within the areas targeted by this assay, and inadequate number of viral copies(<138 copies/mL). A negative result must be combined with clinical observations, patient history, and epidemiological information. The  expected result is Negative.  Fact Sheet for Patients:  BloggerCourse.com  Fact Sheet for Healthcare Providers:  SeriousBroker.it  This test is no t yet approved or cleared by the [place] FDA and  has been authorized for detection and/or diagnosis of SARS-CoV-2 by FDA under an Emergency Use Authorization (EUA). This EUA will remain  in effect (meaning this test can be used) for the duration of the COVID-19 declaration under Section 564(b)(1) of the Act, 21 U.S.C.section 360bbb-3(b)(1), unless the authorization is terminated  or revoked sooner.       Influenza A by PCR NEGATIVE NEGATIVE Final   Influenza B by PCR NEGATIVE NEGATIVE Final    Comment: (NOTE) The Xpert Xpress SARS-CoV-2/FLU/RSV plus assay is intended as an aid in the diagnosis of influenza from Nasopharyngeal swab specimens and should not be used as a sole basis for treatment. Nasal washings and aspirates are unacceptable for Xpert Xpress SARS-CoV-2/FLU/RSV testing.  Fact Sheet for Patients: BloggerCourse.com  Fact Sheet for Healthcare Providers: SeriousBroker.it  This test is not yet approved  or cleared by the [place] and has been authorized for detection and/or diagnosis of SARS-CoV-2 by FDA under an Emergency Use Authorization (EUA). This EUA will remain in effect (meaning this test can be used) for the duration of the COVID-19 declaration under Section 564(b)(1) of the Act, 21 U.S.C. section 360bbb-3(b)(1), unless the authorization is terminated or revoked.  Performed at [facility], [address], [place], [place] [address]              Meredeth Ide   [facility] If 7PM-7AM, please contact night-coverage at [email or web address], Office  [phone]   01/03/2021, 3:47 PM  LOS: 1 day

## 2021-01-03 NOTE — Progress Notes (Signed)
  Echocardiogram 2D Echocardiogram has been performed.  Kaitlin Smith Chrstopher Malenfant 01/03/2021, 9:59 AM

## 2021-01-03 NOTE — Progress Notes (Signed)
ANTICOAGULATION CONSULT NOTE - Brief Note  Pharmacy Consult for Heparin Indication: pulmonary embolus and DVT  Patient Measurements: Height: 5\' 5"  (165.1 cm) Weight: 66.7 kg (147 lb) IBW/kg (Calculated) : 57 Heparin Dosing Weight: TBW   Assessment: See full note earlier today from , PharmD Briefly:  87 yr female started on heparin per pharmacy for PE. Dopplers also positive for bilateral DVT  Heparin level : 0.64, therapeutic on heparin 800 units/hr No bleeding or complications reported   Goal of Therapy:  Heparin level 0.3-0.7 units/ml Monitor platelets by anticoagulation protocol: Yes   Plan:   Continue heparin infusion 800 units/hr  Recheck heparin level in 8 hr to confirm  Daily heparin level and CBC  98 PharmD, BCPS Clinical Pharmacist [facility] main pharmacy [phone] 01/03/2021 3:51 PM

## 2021-01-03 NOTE — Progress Notes (Signed)
ANTICOAGULATION CONSULT NOTE  Pharmacy Consult for Heparin Indication: pulmonary embolus  Allergies  Allergen Reactions  . Clonidine Derivatives Other (See Comments)    Pass out; makes her blood pressure too low  . Milk-Related Compounds Other (See Comments)    Lactose intolerant.      Patient Measurements: Height: 5\' 5"  (165.1 cm) Weight: 66.7 kg (147 lb) IBW/kg (Calculated) : 57 Heparin Dosing Weight: TBW  Vital Signs: Temp: 98.1 F (36.7 C) (04/17 0522) Temp Source: Oral (04/17 0522) BP: 197/91 (04/17 0522) Pulse Rate: 82 (04/17 0522)  Labs: Recent Labs    01/01/21 1742 01/01/21 1742 01/01/21 1906 01/01/21 2338 01/02/21 0548 01/02/21 1053 01/02/21 1818 01/03/21 0434  HGB 12.6  --   --   --  15.0  --   --  14.4  HCT 39.3  --   --   --  44.3  --   --  43.9  PLT 148*  --   --   --  219  --   --  194  APTT  --   --   --  27  --   --   --   --   LABPROT  --   --   --  13.9  --   --   --   --   INR  --   --   --  1.1  --   --   --   --   HEPARINUNFRC  --    < >  --   --  0.33 0.52 0.78* 0.79*  CREATININE  --   --  0.54  --   --   --   --  0.73  TROPONINIHS  --   --  16  --   --   --   --   --    < > = values in this interval not displayed.    Estimated Creatinine Clearance: 43.7 mL/min (by C-G formula based on SCr of 0.73 mg/dL).  Medications:  No anticoagulation PTA  Assessment:  87 yr female presented to ED due to syncopal event  CTAngio = + multiple segmental and subsegmental bilateral PE  Pharmacy consulted to dose IV heparin  Today, 01/03/2021:  Heparin level = 0.79 (supratherapeutic) with heparin gtt @ 900 units/hr  CBC stable WNL  No bleeding or infusion issues noted  Goal of Therapy:  Heparin level 0.3-0.7 units/ml Monitor platelets by anticoagulation protocol: Yes   Plan:   Decrease heparin infusion to 800 units/hr  Recheck heparin level in 8 hr  Daily heparin level and CBC  Monitor for s/s bleeding or worsening thrombosis  F/u  plans for long-term anticoagulation  Jamont Mellin, 01/05/2021, PharmD 01/03/2021,5:43 AM

## 2021-01-04 DIAGNOSIS — I10 Essential (primary) hypertension: Secondary | ICD-10-CM

## 2021-01-04 DIAGNOSIS — I2694 Multiple subsegmental pulmonary emboli without acute cor pulmonale: Secondary | ICD-10-CM | POA: Diagnosis not present

## 2021-01-04 DIAGNOSIS — E119 Type 2 diabetes mellitus without complications: Secondary | ICD-10-CM | POA: Diagnosis not present

## 2021-01-04 LAB — HEMOGLOBIN A1C
Hgb A1c MFr Bld: 5.1 % (ref 4.8–5.6)
Mean Plasma Glucose: 100 mg/dL

## 2021-01-04 LAB — HEPARIN LEVEL (UNFRACTIONATED)
Heparin Unfractionated: 0.38 IU/mL (ref 0.30–0.70)
Heparin Unfractionated: 0.36 IU/mL (ref 0.30–0.70)

## 2021-01-04 LAB — GLUCOSE, CAPILLARY
Glucose-Capillary: 76 mg/dL (ref 70–99)
Glucose-Capillary: 100 mg/dL — ABNORMAL HIGH (ref 70–99)
Glucose-Capillary: 108 mg/dL — ABNORMAL HIGH (ref 70–99)

## 2021-01-04 MED ORDER — RIVAROXABAN 15 MG PO TABS
15.0000 mg | ORAL_TABLET | Freq: Two times a day (BID) | ORAL | Status: DC
  Administered 2021-01-04 – 2021-01-05 (×4): 15 mg via ORAL
  Filled 2021-01-04 (×5): qty 1

## 2021-01-04 MED ORDER — ADULT MULTIVITAMIN W/MINERALS CH
1.0000 | ORAL_TABLET | Freq: Every day | ORAL | Status: DC
  Administered 2021-01-04 – 2021-01-05 (×2): 1 via ORAL
  Filled 2021-01-04 (×2): qty 1

## 2021-01-04 MED ORDER — RIVAROXABAN 20 MG PO TABS: 20.0000 mg | ORAL_TABLET | Freq: Every day | ORAL | Status: DC

## 2021-01-04 MED ORDER — RIVAROXABAN (XARELTO) EDUCATION KIT FOR DVT/PE PATIENTS
PACK | Freq: Once | Status: AC
  Filled 2021-01-04: qty 1

## 2021-01-04 MED ORDER — GLUCERNA SHAKE PO LIQD
237.0000 mL | Freq: Three times a day (TID) | ORAL | Status: DC
  Administered 2021-01-04 – 2021-01-05 (×2): 237 mL via ORAL
  Filled 2021-01-04 (×4): qty 237

## 2021-01-04 NOTE — Discharge Instructions (Signed)
Information on my medicine - XARELTO® (rivaroxaban) ° °This medication education was reviewed with me or my healthcare representative as part of my discharge preparation.  °WHY WAS XARELTO® PRESCRIBED FOR YOU? °Xarelto® was prescribed to treat blood clots that may have been found in the veins of your legs (deep vein thrombosis) or in your lungs (pulmonary embolism) and to reduce the risk of them occurring again. ° °What do you need to know about Xarelto®? °The starting dose is one 15 mg tablet taken TWICE daily with food for the FIRST 21 DAYS then after all 15 mg tablets have been taken  the dose is changed to one 20 mg tablet taken ONCE A DAY with your evening meal. ° °DO NOT stop taking Xarelto® without talking to the health care provider who prescribed the medication.  Refill your prescription for 20 mg tablets before you run out. ° °After discharge, you should have regular check-up appointments with your healthcare provider that is prescribing your Xarelto®.  In the future your dose may need to be changed if your kidney function changes by a significant amount. ° °What do you do if you miss a dose? °If you are taking Xarelto® TWICE DAILY and you miss a dose, take it as soon as you remember. You may take two 15 mg tablets (total 30 mg) at the same time then resume your regularly scheduled 15 mg twice daily the next day. ° °If you are taking Xarelto® ONCE DAILY and you miss a dose, take it as soon as you remember on the same day then continue your regularly scheduled once daily regimen the next day. Do not take two doses of Xarelto® at the same time.  ° °Important Safety Information °Xarelto® is a blood thinner medicine that can cause bleeding. You should call your healthcare provider right away if you experience any of the following: °? Bleeding from an injury or your nose that does not stop. °? Unusual colored urine (red or dark brown) or unusual colored stools (red or black). °? Unusual bruising for unknown  reasons. °? A serious fall or if you hit your head (even if there is no bleeding). ° °Some medicines may interact with Xarelto® and might increase your risk of bleeding while on Xarelto®. To help avoid this, consult your healthcare provider or pharmacist prior to using any new prescription or non-prescription medications, including herbals, vitamins, non-steroidal anti-inflammatory drugs (NSAIDs) and supplements. ° °This website has more information on Xarelto®: www.xarelto.com. °

## 2021-01-04 NOTE — Progress Notes (Signed)
ANTICOAGULATION CONSULT NOTE - Brief Note  Pharmacy Consult for Heparin Indication: pulmonary embolus and DVT  Patient Measurements: Height: 5\' 5"  (165.1 cm) Weight: 66.7 kg (147 lb) IBW/kg (Calculated) : 57 Heparin Dosing Weight: TBW   Assessment: 87 yr female started on heparin per pharmacy for PE. Dopplers also positive for bilateral DVT  Heparin level : 0.38 (level drawn 4/17 @ 23:45 and lab result completed in Epic 4/18 @ 02:46)  Level therapeutic on heparin 800 units/hr No bleeding or complications reported   Goal of Therapy:  Heparin level 0.3-0.7 units/ml Monitor platelets by anticoagulation protocol: Yes   Plan:   Continue heparin infusion 800 units/hr  Daily heparin level and CBC  5/18, PharmD 01/04/2021 2:50 AM

## 2021-01-04 NOTE — Evaluation (Signed)
Physical Therapy Evaluation Patient Details Name: Kaitlin Smith MRN: [id number] DOB: 09/07/1930 Today's Date: 01/04/2021   History of Present Illness  Patient is a 87 y.o. female presented to [facility] via EMS after a syncopal episode with LOC at home during BM. CTA chest, abdomen, pelvis order to r/o aortic dissection revealed multiple subsegmental PE. PMH significant for CAD, DM, HLD, dementia, Glaucoma, h/o CVA.   Clinical Impression  Kaitlin Smith is 87 y.o. female admitted with above HPI and diagnosis. Patient is currently limited by functional impairments below (see PT problem list). Per chart review patient lives with her niece and requires assist with all mobility and ADL's at baseline. Patient is legally blind and HOH and requires encouragement and mod-max verbal/tactile cues for sequencing. She requires +2 mod assist for power up from EOB 3x; blocking for bil feet/knees to prevent sliding forward as pt has significant posterior lean in standing. Patient will benefit from continued skilled PT interventions to address impairments and progress independence with mobility, recommending SNF placement as pt requires 2+ assist currently. Acute PT will follow and progress as able.     Follow Up Recommendations SNF;Supervision/Assistance - 24 hour    Equipment Recommendations  None recommended by PT    Recommendations for Other Services       Precautions / Restrictions Precautions Precautions: Fall Restrictions Weight Bearing Restrictions: No      Mobility  Bed Mobility Overal bed mobility: Needs Assistance Bed Mobility: Supine to Sit;Sit to Supine;Rolling Rolling: Min assist   Supine to sit: Mod assist;+2 for safety/equipment;HOB elevated Sit to supine: Mod assist;+2 for physical assistance;+2 for safety/equipment   General bed mobility comments: pt required mod verbal/tactile cues for sequencing and reassurance as she is blind and unfamiliar with hospital surroundings. Mod Assist to  raise trunk and pivot LE's off EOB. Pt attempting to scoot anteriorly at EOB but hesistant and Mod +2 assist for safety with use of bed pad to move to EOB 2/2 posterior lean sitting. 2+ Mod Assist to return to supine and to boost superiorly in bed.    Transfers Overall transfer level: Needs assistance Equipment used: Rolling walker (2 wheeled);2 person hand held assist Transfers: Sit to/from Stand Sit to Stand: Mod assist;+2 safety/equipment;+2 physical assistance;From elevated surface         General transfer comment: Mod VC's to encourage pt to stand, assist needed for hand placement on RW and for hand placement with 2HHA. Pt required manual facilitation of foot position and had tendency to slide bil feet forward and lean posteriorly on standing. Stood 3x from EOB but unable to obtain full upright posture throughout.  Ambulation/Gait                Stairs            Wheelchair Mobility    Modified Rankin (Stroke Patients Only)       Balance Overall balance assessment: Needs assistance Sitting-balance support: Feet supported;Bilateral upper extremity supported Sitting balance-Leahy Scale: Poor     Standing balance support: Bilateral upper extremity supported Standing balance-Leahy Scale: Poor Standing balance comment: HIGH FALL RISK, max reliance on external support                             Pertinent Vitals/Pain Pain Assessment: Faces Faces Pain Scale: No hurt Pain Intervention(s): Monitored during session    Home Living Family/patient expects to be discharged to:: Private residence  Additional Comments: patient with hx dementia, unable to provide history. per chart lives with her niece whom is her caregiver    Prior Function Level of Independence: Needs assistance   Gait / Transfers Assistance Needed: pt reports use of RW, neice assists her with walking and transfers  ADL's / Homemaking Assistance Needed: pt reports  neice assists with all ADL's  Comments: pt unreliable historian: per chart pt lives with neice and she is her caregiver and assists with all mobility/ADL's.     Hand Dominance        Extremity/Trunk Assessment   Upper Extremity Assessment Upper Extremity Assessment: Generalized weakness    Lower Extremity Assessment Lower Extremity Assessment: Generalized weakness    Cervical / Trunk Assessment Cervical / Trunk Assessment: Normal  Communication   Communication: HOH (Blind)  Cognition Arousal/Alertness: Awake/alert Behavior During Therapy: WFL for tasks assessed/performed Overall Cognitive Status: Within Functional Limits for tasks assessed                                        General Comments      Exercises     Assessment/Plan    PT Assessment Patient needs continued PT services  PT Problem List Decreased strength;Decreased activity tolerance;Decreased balance;Decreased mobility;Decreased cognition;Decreased coordination;Decreased knowledge of use of DME;Decreased safety awareness;Decreased knowledge of precautions       PT Treatment Interventions DME instruction;Gait training;Functional mobility training;Therapeutic activities;Therapeutic exercise;Balance training;Neuromuscular re-education;Patient/family education    PT Goals (Current goals can be found in the Care Plan section)  Acute Rehab PT Goals Patient Stated Goal: pt talking about being able to get up but also fearful; no family present to discuss PT Goal Formulation: With patient Time For Goal Achievement: 01/18/21 Potential to Achieve Goals: Fair    Frequency Min 2X/week   Barriers to discharge        Co-evaluation               AM-PAC PT "6 Clicks" Mobility  Outcome Measure Help needed turning from your back to your side while in a flat bed without using bedrails?: A Little Help needed moving from lying on your back to sitting on the side of a flat bed without using  bedrails?: A Lot Help needed moving to and from a bed to a chair (including a wheelchair)?: Total Help needed standing up from a chair using your arms (e.g., wheelchair or bedside chair)?: Total Help needed to walk in hospital room?: Total Help needed climbing 3-5 steps with a railing? : Total 6 Click Score: 9    End of Session Equipment Utilized During Treatment: Gait belt Activity Tolerance: Patient tolerated treatment well Patient left: in bed;with call bell/phone within reach;with bed alarm set Nurse Communication: Mobility status PT Visit Diagnosis: Other abnormalities of gait and mobility (R26.89);Muscle weakness (generalized) (M62.81);Difficulty in walking, not elsewhere classified (R26.2);Unsteadiness on feet (R26.81)    Time: 1314-1345 31 mins  Charges:   PT Evaluation $PT Eval Moderate Complexity: 1 Mod PT Treatments $Therapeutic Activity: 8-22 mins        Wynn Maudlin, DPT Acute Rehabilitation Services Office [phone] Pager [phone]    Anitra Lauth 01/04/2021, 5:59 PM

## 2021-01-04 NOTE — Progress Notes (Signed)
[facility]  PROGRESS NOTE  Kaitlin Smith WEX:[id number] DOB: [birth year] DOA: 01/01/2021 PCP: Kaitlin Penna, MD   Brief HPI:   87 year old female with a history of CAD, diabetes mellitus type 2, hyperlipidemia, dementia, glaucoma, history of CVA who was straining on commode when she had syncopal episode.  In the ED CTA chest showed multiple subsegmental PEs, patient started on heparin.  Lower extremity venous Dopplers ordered.  Patient also found to have UTI, started on IV Cipro for UTI.    Subjective   Patient seen and examined, denies any complaints.   Assessment/Plan:     1. Bilateral subsegmental PEs-patient started on IV heparin.  Lower extremity venous duplex shows age-indeterminate bilateral DVT.  Echocardiogram shows no right heart strain.  She is currently not requiring oxygen.  We will switch her to NOAC's and discontinue heparin. 2. UTI-patient just completed treatment for UTI, UA was found to be abnormal.  Patient started on IV Rocephin.  Urine culture growing multiple species.  Will discontinue IV Rocephin. 3. Hypertension-continue Coreg, losartan.  Continue as needed hydralazine 4. Dementia-patient is not on medications, continue Haldol as needed for agitation.   Scheduled medications:   . carvedilol  12.5 mg Oral BID WC  . insulin aspart  0-9 Units Subcutaneous TID WC  . latanoprost  1 drop Both Eyes QHS  . losartan  50 mg Oral Daily  . pantoprazole  40 mg Oral Daily  . rivaroxaban   Does not apply Once  . Rivaroxaban  15 mg Oral BID WC   Followed by  . [START ON 01/25/2021] rivaroxaban  20 mg Oral Q supper  . tamsulosin  0.4 mg Oral Daily         Data Reviewed:   CBG:  Recent Labs  Lab 01/03/21 1154 01/03/21 1727 01/03/21 2214 01/04/21 0717 01/04/21 1128  GLUCAP 111* 91 123* 76 100*    SpO2: 97 %    Vitals:   01/03/21 1506 01/03/21 2213 01/04/21 0636 01/04/21 1349  BP: (!) 153/74 126/63 113/63 105/64  Pulse: 71 66 79 68  Resp: 18 18   18   Temp: 98.5 F (36.9 C) 98.1 F (36.7 C) 98 F (36.7 C) (!) 97.5 F (36.4 C)  TempSrc: Oral Oral Oral Oral  SpO2: 100% 100%  97%  Weight:      Height:         Intake/Output Summary (Last 24 hours) at 01/04/2021 1432 Last data filed at 01/04/2021 0500 Gross per 24 hour  Intake 904.51 ml  Output 1000 ml  Net -95.49 ml    04/16 1901 - 04/18 0700 In: 1634.5 [P.O.:480; I.V.:954.5] Out: 2000 [Urine:2000]  Filed Weights   01/01/21 1749  Weight: 66.7 kg    CBC:  Recent Labs  Lab 01/01/21 1742 01/02/21 0548 01/03/21 0434  WBC 6.9 7.4 6.4  HGB 12.6 15.0 14.4  HCT 39.3 44.3 43.9  PLT 148* 219 194  MCV 101.3* 94.7 98.7  MCH 32.5 32.1 32.4  MCHC 32.1 33.9 32.8  RDW 12.7 12.7 12.8  LYMPHSABS 1.4 2.4  --   MONOABS 0.4 0.7  --   EOSABS 0.3 0.3  --   BASOSABS 0.0 0.0  --     Complete metabolic panel:  Recent Labs  Lab 01/01/21 1906 01/01/21 2338 01/02/21 0548 01/03/21 0434  NA 140  --   --  138  K 3.7  --   --  4.5  CL 115*  --   --  106  CO2 18*  --   --  23  GLUCOSE 79  --   --  92  BUN 10  --   --  12  CREATININE 0.54  --   --  0.73  CALCIUM 6.7*  --   --  8.7*  AST 15  --   --  21  ALT 7  --   --  7  ALKPHOS 63  --   --  69  BILITOT 1.0  --   --  0.8  ALBUMIN 2.4*  --   --  2.9*  INR  --  1.1  --   --   HGBA1C  --   --  5.1  --     No results for input(s): LIPASE, AMYLASE in the last 168 hours.  Recent Labs  Lab 01/01/21 2334  SARSCOV2NAA NEGATIVE    ------------------------------------------------------------------------------------------------------------------ No results for input(s): CHOL, HDL, LDLCALC, TRIG, CHOLHDL, LDLDIRECT in the last 72 hours.  Lab Results  Component Value Date   HGBA1C 5.1 01/02/2021   ------------------------------------------------------------------------------------------------------------------ No results for input(s): TSH, T4TOTAL, T3FREE, THYROIDAB in the last 72 hours.  Invalid input(s):  FREET3 ------------------------------------------------------------------------------------------------------------------ No results for input(s): VITAMINB12, FOLATE, FERRITIN, TIBC, IRON, RETICCTPCT in the last 72 hours.  Coagulation profile  Recent Labs  Lab 01/01/21 2338  INR 1.1    No results for input(s): DDIMER in the last 72 hours.  Cardiac Enzymes  No results for input(s): CKMB, TROPONINI, MYOGLOBIN in the last 168 hours.  Invalid input(s): CK ------------------------------------------------------------------------------------------------------------------    Component Value Date/Time   BNP 1,007.4 (H) 09/08/2020 2125     Antibiotics: Anti-infectives (From admission, onward)   Start     Dose/Rate Route Frequency Ordered Stop   01/02/21 1800  cefTRIAXone (ROCEPHIN) 1 g in sodium chloride 0.9 % 100 mL IVPB  Status:  Discontinued        1 g 200 mL/hr over 30 Minutes Intravenous Every 24 hours 01/02/21 1016 01/04/21 0928   01/02/21 0200  ciprofloxacin (CIPRO) IVPB 400 mg  Status:  Discontinued        400 mg 200 mL/hr over 60 Minutes Intravenous Every 12 hours 01/02/21 0134 01/02/21 1016       Radiology Reports  ECHOCARDIOGRAM COMPLETE  Result Date: 01/03/2021    ECHOCARDIOGRAM REPORT   Patient Name:   Kaitlin Smith Date of Exam: 01/03/2021 Medical Rec #:  [id number]    Height:       65.0 in Accession #:    [id number]   Weight:       147.0 lb Date of Birth:  1930-08-05    BSA:          1.736 m Patient Age:    89 years     BP:           197/91 mmHg Patient Gender: F            HR:           91 bpm. Exam Location:  Inpatient Procedure: 2D Echo, Cardiac Doppler and Color Doppler Indications:    I26.02 Pulmonary embolus  History:        Patient has prior history of Echocardiogram examinations, most                 recent 09/09/2020. Previous Myocardial Infarction and CAD, COPD;                 Risk Factors:Hypertension, Diabetes and Dyslipidemia. COVID-19.  Sonographer:     Kaitlin Smith Referring Phys: Kaitlin Smith  Sonographer Comments: No subcostal window. IMPRESSIONS  1. Left ventricular ejection fraction, by estimation, is 65 to 70%. The left ventricle has normal function. The left ventricle has no regional wall motion abnormalities. There is moderate left ventricular hypertrophy. Left ventricular diastolic parameters are consistent with Grade I diastolic dysfunction (impaired relaxation).  2. Right ventricular systolic function is normal. The right ventricular size is normal.  3. The mitral valve is normal in structure. No evidence of mitral valve regurgitation. No evidence of mitral stenosis.  4. The aortic valve is tricuspid. There is moderate calcification of the aortic valve. There is moderate thickening of the aortic valve. Aortic valve regurgitation is not visualized. Mild to moderate aortic valve sclerosis/calcification is present, without any evidence of aortic stenosis. FINDINGS  Left Ventricle: LVOT and AV Dopplers appear off angle and inaccurate. Left ventricular ejection fraction, by estimation, is 65 to 70%. The left ventricle has normal function. The left ventricle has no regional wall motion abnormalities. The left ventricular internal cavity size was normal in size. There is moderate left ventricular hypertrophy. Left ventricular diastolic parameters are consistent with Grade I diastolic dysfunction (impaired relaxation). Normal left ventricular filling pressure. Right Ventricle: The right ventricular size is normal. Right vetricular wall thickness was not assessed. Right ventricular systolic function is normal. Left Atrium: Left atrial size was normal in size. Right Atrium: Right atrial size was normal in size. Pericardium: There is no evidence of pericardial effusion. Mitral Valve: The mitral valve is normal in structure. No evidence of mitral valve regurgitation. No evidence of mitral valve stenosis. Tricuspid Valve: The tricuspid valve is normal in  structure. Tricuspid valve regurgitation is not demonstrated. No evidence of tricuspid stenosis. Aortic Valve: The aortic valve is tricuspid. There is moderate calcification of the aortic valve. There is moderate thickening of the aortic valve. There is moderate aortic valve annular calcification. Aortic valve regurgitation is not visualized. Mild to moderate aortic valve sclerosis/calcification is present, without any evidence of aortic stenosis. Aortic valve mean gradient measures 3.0 mmHg. Aortic valve peak gradient measures 5.8 mmHg. Aortic valve area, by VTI measures 1.63 cm. Pulmonic Valve: The pulmonic valve was not well visualized. Pulmonic valve regurgitation is not visualized. No evidence of pulmonic stenosis. Aorta: The aortic root is normal in size and structure. IAS/Shunts: The interatrial septum was not well visualized.  LEFT VENTRICLE PLAX 2D LVIDd:         3.80 cm  Diastology LVIDs:         2.60 cm  LV e' medial:    3.92 cm/s LV PW:         1.30 cm  LV E/e' medial:  12.9 LV IVS:        1.20 cm  LV e' lateral:   4.24 cm/s LVOT diam:     2.10 cm  LV E/e' lateral: 12.0 LV SV:         38 LV SV Index:   22 LVOT Area:     3.46 cm  RIGHT VENTRICLE RV Basal diam:  1.60 cm RV S prime:     10.00 cm/s LEFT ATRIUM             Index       RIGHT ATRIUM          Index LA diam:        3.90 cm 2.25 cm/m  RA Area:     5.91 cm LA Vol (A2C):   28.2 ml 16.25 ml/m RA Volume:   8.06  ml  4.64 ml/m LA Vol (A4C):   22.3 ml 12.85 ml/m LA Biplane Vol: 25.5 ml 14.69 ml/m  AORTIC VALVE AV Area (Vmax):    1.90 cm AV Area (Vmean):   1.97 cm AV Area (VTI):     1.63 cm AV Vmax:           120.67 cm/s AV Vmean:          81.667 cm/s AV VTI:            0.232 m AV Peak Grad:      5.8 mmHg AV Mean Grad:      3.0 mmHg LVOT Vmax:         66.20 cm/s LVOT Vmean:        46.400 cm/s LVOT VTI:          0.109 m LVOT/AV VTI ratio: 0.47  AORTA Ao Root diam: 3.20 cm Ao Asc diam:  3.40 cm MITRAL VALVE MV Area (PHT): 2.87 cm    SHUNTS MV  Decel Time: 264 msec    Systemic VTI:  0.11 m MV E velocity: 50.70 cm/s  Systemic Diam: 2.10 cm MV A velocity: 96.10 cm/s MV E/A ratio:  0.53 Dina Rich MD Electronically signed by Dina Rich MD Signature Date/Time: 01/03/2021/11:21:38 AM    Final    VAS [place] LOWER EXTREMITY VENOUS (DVT)  Result Date: 01/04/2021  Lower Venous DVT Study Indications: Pulmonary embolism, and history of DVT.  Limitations: Patient unwilling to cooperate with positioning. Overlying shadowing for overlying arterial calcification. Comparison Study: 09-02-2020 Bilateral lower extremity venous was limited, but                   positive for acute DVT of the RT popliteal. Performing Technologist: Jean Rosenthal RDMS,RVT  Examination Guidelines: A complete evaluation includes B-mode imaging, spectral Doppler, color Doppler, and power Doppler as needed of all accessible portions of each vessel. Bilateral testing is considered an integral part of a complete examination. Limited examinations for reoccurring indications may be performed as noted. The reflux portion of the exam is performed with the patient in reverse Trendelenburg.  +---------+---------------+---------+-----------+----------+-----------------+ RIGHT    CompressibilityPhasicitySpontaneityPropertiesThrombus Aging    +---------+---------------+---------+-----------+----------+-----------------+ CFV      Full           Yes      Yes                                    +---------+---------------+---------+-----------+----------+-----------------+ SFJ      Full                                                           +---------+---------------+---------+-----------+----------+-----------------+ FV Prox  Full                                                           +---------+---------------+---------+-----------+----------+-----------------+ FV Mid   Full                                                            +---------+---------------+---------+-----------+----------+-----------------+  FV DistalFull           Yes      Yes                                    +---------+---------------+---------+-----------+----------+-----------------+ PFV      Full                                                           +---------+---------------+---------+-----------+----------+-----------------+ POP      None           No       No                   Age Indeterminate +---------+---------------+---------+-----------+----------+-----------------+ PTV                                                   Not visualized    +---------+---------------+---------+-----------+----------+-----------------+ PERO                                                  Not visualized    +---------+---------------+---------+-----------+----------+-----------------+   +---------+---------------+---------+-----------+----------+-------------------+ LEFT     CompressibilityPhasicitySpontaneityPropertiesThrombus Aging      +---------+---------------+---------+-----------+----------+-------------------+ CFV      Full           Yes      Yes                                      +---------+---------------+---------+-----------+----------+-------------------+ SFJ      Full                                                             +---------+---------------+---------+-----------+----------+-------------------+ FV Prox  Partial        Yes      Yes                  Age Indeterminate   +---------+---------------+---------+-----------+----------+-------------------+ FV Mid   Partial        No       No                                       +---------+---------------+---------+-----------+----------+-------------------+ FV DistalFull                                                             +---------+---------------+---------+-----------+----------+-------------------+ PFV      Full                                                              +---------+---------------+---------+-----------+----------+-------------------+  POP                                                   Unable to visualize                                                       due to patient                                                            positioning         +---------+---------------+---------+-----------+----------+-------------------+ PTV                     Yes      Yes                                      +---------+---------------+---------+-----------+----------+-------------------+ PERO                                                  Not well visualized +---------+---------------+---------+-----------+----------+-------------------+     Summary: RIGHT: - Findings consistent with age indeterminate deep vein thrombosis involving the right popliteal vein. - No cystic structure found in the popliteal fossa.  LEFT: - Findings consistent with age indeterminate deep vein thrombosis involving the left femoral vein.  *See table(s) above for measurements and observations. Electronically signed by Fabienne Bruns MD on 01/04/2021 at 12:56:43 PM.    Final       DVT prophylaxis: Patient is on heparin full dose  Code Status: DNR  Family Communication: Discussed with patient's niece on phone on 01/02/2021   Consultants:    Procedures:      Objective    Physical Examination:    General-appears in no acute distress  Heart-S1-S2, regular, no murmur auscultated  Lungs-clear to auscultation bilaterally, no wheezing or crackles auscultated  Abdomen-soft, nontender, no organomegaly  Extremities-no edema in the lower extremities  Neuro-alert, oriented x3, no focal deficit noted   Status is: Inpatient  Dispo: The patient is from: Home              Anticipated d/c is to: Home              Anticipated d/c date is: 01/05/2021              Patient currently not  stable for discharge  Barrier to discharge-pending PT evaluation evaluation   COVID-19 Labs  No results for input(s): DDIMER, FERRITIN, LDH, CRP in the last 72 hours.  Lab Results  Component Value Date   SARSCOV2NAA NEGATIVE 01/01/2021   SARSCOV2NAA POSITIVE (A) 08/31/2020    Microbiology  Recent Results (from the past 240 hour(s))  Urine culture     Status: Abnormal   Collection Time: 01/01/21 10:50 PM  Specimen: Urine, Random  Result Value Ref Range Status   Specimen Description   Final    URINE, RANDOM Performed at [facility], [address] [place], [place] [address]    Special Requests   Final    NONE Performed at [facility], [address], [place], [place] [address]    Culture (A)  Final    >=100,000 COLONIES/mL MULTIPLE SPECIES PRESENT, SUGGEST RECOLLECTION   Report Status 01/03/2021 FINAL  Final  Resp Panel by RT-PCR (Flu A&B, Covid) Nasopharyngeal Swab     Status: None   Collection Time: 01/01/21 11:34 PM   Specimen: Nasopharyngeal Swab; Nasopharyngeal(NP) swabs in vial transport medium  Result Value Ref Range Status   SARS Coronavirus 2 by RT PCR NEGATIVE NEGATIVE Final    Comment: (NOTE) SARS-CoV-2 target nucleic acids are NOT DETECTED.  The SARS-CoV-2 RNA is generally detectable in upper respiratory specimens during the acute phase of infection. The lowest concentration of SARS-CoV-2 viral copies this assay can detect is 138 copies/mL. A negative result does not preclude SARS-Cov-2 infection and should not be used as the sole basis for treatment or other patient management decisions. A negative result may occur with  improper specimen collection/handling, submission of specimen other than nasopharyngeal swab, presence of viral mutation(s) within the areas targeted by this assay, and inadequate number of viral copies(<138 copies/mL). A negative result must be combined with clinical observations, patient history,  and epidemiological information. The expected result is Negative.  Fact Sheet for Patients:  BloggerCourse.com  Fact Sheet for Healthcare Providers:  SeriousBroker.it  This test is no t yet approved or cleared by the [place] FDA and  has been authorized for detection and/or diagnosis of SARS-CoV-2 by FDA under an Emergency Use Authorization (EUA). This EUA will remain  in effect (meaning this test can be used) for the duration of the COVID-19 declaration under Section 564(b)(1) of the Act, 21 U.S.C.section 360bbb-3(b)(1), unless the authorization is terminated  or revoked sooner.       Influenza A by PCR NEGATIVE NEGATIVE Final   Influenza B by PCR NEGATIVE NEGATIVE Final    Comment: (NOTE) The Xpert Xpress SARS-CoV-2/FLU/RSV plus assay is intended as an aid in the diagnosis of influenza from Nasopharyngeal swab specimens and should not be used as a sole basis for treatment. Nasal washings and aspirates are unacceptable for Xpert Xpress SARS-CoV-2/FLU/RSV testing.  Fact Sheet for Patients: BloggerCourse.com  Fact Sheet for Healthcare Providers: SeriousBroker.it  This test is not yet approved or cleared by the [place] FDA and has been authorized for detection and/or diagnosis of SARS-CoV-2 by FDA under an Emergency Use Authorization (EUA). This EUA will remain in effect (meaning this test can be used) for the duration of the COVID-19 declaration under Section 564(b)(1) of the Act, 21 U.S.C. section 360bbb-3(b)(1), unless the authorization is terminated or revoked.  Performed at [facility], [address], [place], [place] [address]              Meredeth Ide   [facility] If 7PM-7AM, please contact night-coverage at [email or web address], Office  [phone]   01/04/2021, 2:32 PM  LOS: 2 days

## 2021-01-04 NOTE — Progress Notes (Signed)
Initial Nutrition Assessment  DOCUMENTATION CODES:  Not applicable  INTERVENTION:  Continue current diet regimen.  Add Glucerna Shake po TID, each supplement provides 220 kcal and 10 grams of protein.  Add MVI with minerals daily.  NUTRITION DIAGNOSIS:  Increased nutrient needs related to acute illness (UTI) as evidenced by estimated needs.  GOAL:  Patient will meet greater than or equal to 90% of their needs  MONITOR:  PO intake,Supplement acceptance,Labs,Weight trends,I & O's  REASON FOR ASSESSMENT:  Malnutrition Screening Tool    ASSESSMENT:  87 yo female with PMH of CAD, T2DM, HLD, dementia, glaucoma, and hx of CVA who presents with syncope and UTI. Found to have DVT in BLE.  Spoke with pt at bedside. Of note, pt is hard of hearing and blind. She reports eating well before coming to the hospital, but was unable to tell RD what she had been eating. Per Epic, she had 60% of her dinner last night and 80% of her breakfast this morning.  She denies any weight loss. Per Epic, pt has gained weight since the end of 08/2020. Given that pt has edema this admission, this may be fluid and not weight.  On exam, pt had few depletions, likely from age. Fluid may also be masking depletions. However, if PO intake declines, pt at risk for malnutrition.  Recommend adding Glucerna TID to add calories and protein and MVI with minerals daily.  Medications: SSI, Protonix, IVF @ 10 ml/hr Labs: reviewed; CBG 91-123 HbA1c: 5.1% (12/2020)  NUTRITION - FOCUSED PHYSICAL EXAM: Flowsheet Row Most Recent Value  Orbital Region Moderate depletion  Upper Arm Region Mild depletion  Thoracic and Lumbar Region No depletion  Buccal Region Moderate depletion  Temple Region Mild depletion  Clavicle Bone Region No depletion  Clavicle and Acromion Bone Region No depletion  Scapular Bone Region Unable to assess  Dorsal Hand Mild depletion  Patellar Region No depletion  Anterior Thigh Region No depletion   Posterior Calf Region No depletion  Edema (RD Assessment) Mild  Hair Reviewed  Eyes Reviewed  Mouth Reviewed  Skin Reviewed  Nails Reviewed     Diet Order:   Diet Order            Diet Carb Modified Fluid consistency: Thin; Room service appropriate? Yes with Assist  Diet effective now                EDUCATION NEEDS:  Education needs have been addressed  Skin:  Skin Assessment: Reviewed RN Assessment  Last BM:  01/02/21  Height:  Ht Readings from Last 1 Encounters:  01/01/21 5\' 5"  (1.651 m)   Weight:  Wt Readings from Last 1 Encounters:  01/01/21 66.7 kg   Ideal Body Weight:  56.8 kg  BMI:  Body mass index is 24.46 kg/m.  Estimated Nutritional Needs:  Kcal:  1550-1750 Protein:  70-85 grams Fluid:  >1.5 L  01/03/21, RD, LDN Registered Dietitian After Hours/Weekend Pager # in [place]

## 2021-01-04 NOTE — Progress Notes (Addendum)
ANTICOAGULATION CONSULT NOTE  Pharmacy Consult for Heparin>>xarelto Indication: pulmonary embolus  Allergies  Allergen Reactions  . Clonidine Derivatives Other (See Comments)    Pass out; makes her blood pressure too low  . Milk-Related Compounds Other (See Comments)    Lactose intolerant.      Patient Measurements: Height: 5\' 5"  (165.1 cm) Weight: 66.7 kg (147 lb) IBW/kg (Calculated) : 57 Heparin Dosing Weight: TBW  Vital Signs: Temp: 98 F (36.7 C) (04/18 0636) Temp Source: Oral (04/18 0636) BP: 113/63 (04/18 0636) Pulse Rate: 79 (04/18 0636)  Labs: Recent Labs    01/01/21 1742 01/01/21 1906 01/01/21 2338 01/02/21 0548 01/02/21 1053 01/03/21 0434 01/03/21 1459 01/03/21 2345 01/04/21 0537  HGB 12.6  --   --  15.0  --  14.4  --   --   --   HCT 39.3  --   --  44.3  --  43.9  --   --   --   PLT 148*  --   --  219  --  194  --   --   --   APTT  --   --  27  --   --   --   --   --   --   LABPROT  --   --  13.9  --   --   --   --   --   --   INR  --   --  1.1  --   --   --   --   --   --   HEPARINUNFRC  --   --   --  0.33   < > 0.79* 0.64 0.38 0.36  CREATININE  --  0.54  --   --   --  0.73  --   --   --   TROPONINIHS  --  16  --   --   --   --   --   --   --    < > = values in this interval not displayed.    Estimated Creatinine Clearance: 43.7 mL/min (by C-G formula based on SCr of 0.73 mg/dL).  Medications:  No anticoagulation PTA  Assessment:  87 yr female presented to ED due to syncopal event  CTAngio = + multiple segmental and subsegmental bilateral PE  Pharmacy consulted to dose IV heparin  Today, 01/04/2021:  Heparin level = 0.36 - therapeutic on heparin gtt @ 800 units/hr  CBC stable WNL- no CBC today/lab missed- ordered daily 4/16  No bleeding or infusion issues noted  4/17 Dopplers + for age indeterminante DVT on R & L  To transition to Xarelto today  Goal of Therapy:  Heparin level 0.3-0.7 units/ml Monitor platelets by anticoagulation  protocol: Yes   Plan:  DC heparin drip xarelto 15 bid x 21 days followed by xarelto 20 qday Will provide 30 day free card & education today  5/17, Pharm.D 01/04/2021 9:01 AM

## 2021-01-05 DIAGNOSIS — I2694 Multiple subsegmental pulmonary emboli without acute cor pulmonale: Secondary | ICD-10-CM | POA: Diagnosis not present

## 2021-01-05 DIAGNOSIS — I10 Essential (primary) hypertension: Secondary | ICD-10-CM | POA: Diagnosis not present

## 2021-01-05 LAB — GLUCOSE, CAPILLARY
Glucose-Capillary: 79 mg/dL (ref 70–99)
Glucose-Capillary: 97 mg/dL (ref 70–99)
Glucose-Capillary: 98 mg/dL (ref 70–99)
Glucose-Capillary: 117 mg/dL — ABNORMAL HIGH (ref 70–99)

## 2021-01-05 MED ORDER — RIVAROXABAN (XARELTO) VTE STARTER PACK (15 & 20 MG): ORAL_TABLET | ORAL | 0 refills | Status: DC

## 2021-01-05 NOTE — Discharge Summary (Addendum)
Physician Discharge Summary  Kaitlin Smith IHK:[id number] DOB: 26-Oct-1929 DOA: 01/01/2021  PCP: Alysia Penna, MD  Admit date: 01/01/2021 Discharge date: 01/05/2021  Time spent: 50 minutes  Recommendations for Outpatient Follow-up:  1. Follow-up PCP in 2 weeks 2. Patient will be discharged on Xarelto starter pack, she will need Xarelto refills after completing starter pack. 3. Continue taking Xarelto for 6 months   Discharge Diagnoses:   Principal problem Pulmonary embolism DVT  Active Problems:   Essential hypertension   Dementia   General weakness    Discharge Condition: Stable  Diet recommendation: Heart healthy diet  Filed Weights   01/01/21 1749  Weight: 66.7 kg    History of present illness:  87 year old female with a history of CAD, diabetes mellitus type 2, hyperlipidemia, dementia, glaucoma, history of CVA who was straining on commode when she had syncopal episode. In the ED CTA chest showed multiple subsegmental PEs, patient started on heparin. Lower extremity venous Dopplers ordered. Patient also found to have UTI, started on IV Cipro for UTI.   Hospital Course:  1. Bilateral subsegmental PEs-patient started on IV heparin.  Lower extremity venous duplex shows age-indeterminate bilateral DVT.  Echocardiogram shows no right heart strain.  She is currently not requiring oxygen.    Patient has been started on Xarelto.  Will discharge on Xarelto starter pack.  She will need to have prescriptions for Xarelto 20 mg daily before running out of starter pack.  Also continue taking Xarelto for 6 months.  2. DVT-venous duplex of lower extremities showed bilateral age-indeterminate DVTs.  Patient is already on Xarelto as above.  3. UTI-patient just completed treatment for UTI, UA was found to be abnormal.  Patient started on IV Rocephin.  Urine culture growing multiple species.    IV Rocephin was discontinued.  4. Hypertension-continue Coreg,  losartan.  5. Dementia-stable   Procedures:    Consultations:    Discharge Exam: Vitals:   01/04/21 2233 01/05/21 0551  BP: 116/63 133/70  Pulse: 72 74  Resp: 17   Temp: 99 F (37.2 C) 98.6 F (37 C)  SpO2: 100% 99%    General: Appears in no acute distress Cardiovascular: S1-S2, regular Respiratory: Clear to auscultation bilaterally  Discharge Instructions   Discharge Instructions    Diet - low sodium heart healthy   Complete by: As directed    Increase activity slowly   Complete by: As directed      Allergies as of 01/05/2021      Reactions   Clonidine Derivatives Other (See Comments)   Pass out; makes her blood pressure too low   Milk-related Compounds Other (See Comments)   Lactose intolerant.        Medication List    STOP taking these medications   aspirin 81 MG EC tablet     TAKE these medications   acetaminophen 325 MG tablet Commonly known as: TYLENOL Take 2 tablets (650 mg total) by mouth every 6 (six) hours as needed for mild pain (temp > 101.5).   carvedilol 12.5 MG tablet Commonly known as: COREG Take 1 tablet (12.5 mg total) by mouth 2 (two) times daily with a meal.   losartan 50 MG tablet Commonly known as: COZAAR Take 1 tablet (50 mg total) by mouth daily.   Lumigan 0.01 % Soln Generic drug: bimatoprost Place 1 drop into both eyes at bedtime.   pantoprazole 40 MG tablet Commonly known as: PROTONIX TAKE 1 TABLET BY MOUTH EVERY DAY   Rivaroxaban Stater Pack (15  mg and 20 mg) Commonly known as: XARELTO STARTER PACK Follow package directions: Take one 15mg  tablet by mouth twice a day. On day 22, switch to one 20mg  tablet once a day. Take with food.   Simbrinza 1-0.2 % Susp Generic drug: Brinzolamide-Brimonidine Place 1 drop into both eyes daily.   tamsulosin 0.4 MG Caps capsule Commonly known as: FLOMAX Take 0.4 mg by mouth daily.   Voltaren 1 % Gel Generic drug: diclofenac Sodium APPLY 4 GRAMS TOPICALLY 2 TIMES DAILY  AS NEEDED.      Allergies  Allergen Reactions  . Clonidine Derivatives Other (See Comments)    Pass out; makes her blood pressure too low  . Milk-Related Compounds Other (See Comments)    Lactose intolerant.        The results of significant diagnostics from this hospitalization (including imaging, microbiology, ancillary and laboratory) are listed below for reference.    Significant Diagnostic Studies: ECHOCARDIOGRAM COMPLETE  Result Date: 01/03/2021    ECHOCARDIOGRAM REPORT   Patient Name:   Kaitlin Smith Date of Exam: 01/03/2021 Medical Rec #:  [id number]    Height:       65.0 in Accession #:    [id number]   Weight:       147.0 lb Date of Birth:  1929/10/29    BSA:          1.736 m Patient Age:    87 years     BP:           197/91 mmHg Patient Gender: F            HR:           91 bpm. Exam Location:  Inpatient Procedure: 2D Echo, Cardiac Doppler and Color Doppler Indications:    I26.02 Pulmonary embolus  History:        Patient has prior history of Echocardiogram examinations, most                 recent 09/09/2020. Previous Myocardial Infarction and CAD, COPD;                 Risk Factors:Hypertension, Diabetes and Dyslipidemia. COVID-19.  Sonographer:    Elmarie Shileyiffany Dance Referring Phys: [id number] Louie Flenner S Aurianna Earlywine  Sonographer Comments: No subcostal window. IMPRESSIONS  1. Left ventricular ejection fraction, by estimation, is 65 to 70%. The left ventricle has normal function. The left ventricle has no regional wall motion abnormalities. There is moderate left ventricular hypertrophy. Left ventricular diastolic parameters are consistent with Grade I diastolic dysfunction (impaired relaxation).  2. Right ventricular systolic function is normal. The right ventricular size is normal.  3. The mitral valve is normal in structure. No evidence of mitral valve regurgitation. No evidence of mitral stenosis.  4. The aortic valve is tricuspid. There is moderate calcification of the aortic valve. There is moderate  thickening of the aortic valve. Aortic valve regurgitation is not visualized. Mild to moderate aortic valve sclerosis/calcification is present, without any evidence of aortic stenosis. FINDINGS  Left Ventricle: LVOT and AV Dopplers appear off angle and inaccurate. Left ventricular ejection fraction, by estimation, is 65 to 70%. The left ventricle has normal function. The left ventricle has no regional wall motion abnormalities. The left ventricular internal cavity size was normal in size. There is moderate left ventricular hypertrophy. Left ventricular diastolic parameters are consistent with Grade I diastolic dysfunction (impaired relaxation). Normal left ventricular filling pressure. Right Ventricle: The right ventricular size is normal. Right vetricular wall thickness was not  assessed. Right ventricular systolic function is normal. Left Atrium: Left atrial size was normal in size. Right Atrium: Right atrial size was normal in size. Pericardium: There is no evidence of pericardial effusion. Mitral Valve: The mitral valve is normal in structure. No evidence of mitral valve regurgitation. No evidence of mitral valve stenosis. Tricuspid Valve: The tricuspid valve is normal in structure. Tricuspid valve regurgitation is not demonstrated. No evidence of tricuspid stenosis. Aortic Valve: The aortic valve is tricuspid. There is moderate calcification of the aortic valve. There is moderate thickening of the aortic valve. There is moderate aortic valve annular calcification. Aortic valve regurgitation is not visualized. Mild to moderate aortic valve sclerosis/calcification is present, without any evidence of aortic stenosis. Aortic valve mean gradient measures 3.0 mmHg. Aortic valve peak gradient measures 5.8 mmHg. Aortic valve area, by VTI measures 1.63 cm. Pulmonic Valve: The pulmonic valve was not well visualized. Pulmonic valve regurgitation is not visualized. No evidence of pulmonic stenosis. Aorta: The aortic root  is normal in size and structure. IAS/Shunts: The interatrial septum was not well visualized.  LEFT VENTRICLE PLAX 2D LVIDd:         3.80 cm  Diastology LVIDs:         2.60 cm  LV e' medial:    3.92 cm/s LV PW:         1.30 cm  LV E/e' medial:  12.9 LV IVS:        1.20 cm  LV e' lateral:   4.24 cm/s LVOT diam:     2.10 cm  LV E/e' lateral: 12.0 LV SV:         38 LV SV Index:   22 LVOT Area:     3.46 cm  RIGHT VENTRICLE RV Basal diam:  1.60 cm RV S prime:     10.00 cm/s LEFT ATRIUM             Index       RIGHT ATRIUM          Index LA diam:        3.90 cm 2.25 cm/m  RA Area:     5.91 cm LA Vol (A2C):   28.2 ml 16.25 ml/m RA Volume:   8.06 ml  4.64 ml/m LA Vol (A4C):   22.3 ml 12.85 ml/m LA Biplane Vol: 25.5 ml 14.69 ml/m  AORTIC VALVE AV Area (Vmax):    1.90 cm AV Area (Vmean):   1.97 cm AV Area (VTI):     1.63 cm AV Vmax:           120.67 cm/s AV Vmean:          81.667 cm/s AV VTI:            0.232 m AV Peak Grad:      5.8 mmHg AV Mean Grad:      3.0 mmHg LVOT Vmax:         66.20 cm/s LVOT Vmean:        46.400 cm/s LVOT VTI:          0.109 m LVOT/AV VTI ratio: 0.47  AORTA Ao Root diam: 3.20 cm Ao Asc diam:  3.40 cm MITRAL VALVE MV Area (PHT): 2.87 cm    SHUNTS MV Decel Time: 264 msec    Systemic VTI:  0.11 m MV E velocity: 50.70 cm/s  Systemic Diam: 2.10 cm MV A velocity: 96.10 cm/s MV E/A ratio:  0.53 Dina Rich MD Electronically signed by Dina Rich MD  Signature Date/Time: 01/03/2021/11:21:38 AM    Final    CT Angio Chest/Abd/Pel for Dissection W and/or W/WO  Result Date: 01/01/2021 CLINICAL DATA:  Syncopal episode, loss of consciousness EXAM: CT ANGIOGRAPHY CHEST, ABDOMEN AND PELVIS TECHNIQUE: Non-contrast CT of the chest was initially obtained. Multidetector CT imaging through the chest, abdomen and pelvis was performed using the standard protocol during bolus administration of intravenous contrast. Multiplanar reconstructed images and MIPs were obtained and reviewed to evaluate the  vascular anatomy. CONTRAST:  OMNIPAQUE IOHEXOL 350 MG/ML SOLN COMPARISON:  Chest radiograph 09/11/2020, CT angiography, chest, abdomen and pelvis 11/14/2018 FINDINGS: CTA CHEST FINDINGS Cardiovascular: Precontrast imaging without hyperdense mural thickening or plaque displacement to suggest intramural hematoma. Postcontrast administration there is satisfactory opacification of the arterial vasculature. Redemonstration of the extensive calcified noncalcified plaque throughout the thoracic aorta. Irregular luminal plaque projections resulting in a curvilinear filling defect seen at the level of the aortic arch are unchanged from prior (6/20). Additional site of penetrating atherosclerotic ulcer measuring 1.5 cm at the base and to 10 mm in depth, also unchanged from the comparison study (6/62). A distal thoracic aortic curvilinear filling defect seen on comparison imaging is no longer visible. No other acute or significant luminal abnormality of the thoracic aorta. Normal 3 vessel branching of the aortic arch is seen. Calcified and noncalcified atheromatous plaque is present in the proximal great vessels resulting in at most mild luminal narrowing, difficult to fully assess this nondedicated exam. Central pulmonary arteries are top-normal caliber. There is extensive filling defect seen extending from the distal left main pulmonary artery into the left upper lobar and segmental branches of the lingula. Additional lobar to segmental filling defects are present right upper and middle lobes as well. Assessment for right heart strain is limited by a chronic cardiomegaly, present on prior. Pulmonary artery caliber is similar to comparison as well. Coronary artery calcifications are seen. No pericardial effusion. Mediastinum/Nodes: Heterogeneous, enlarged multinodular thyroid gland. Thoracic inlet otherwise unremarkable. No concerning mediastinal, hilar or axillary adenopathy. No acute abnormality of the trachea.  Fluid-filled appearance of the distal thoracic esophagus,. Lungs/Pleura: Extensive ground-glass and consolidative opacity present in the dependent portion of the lungs with at least partially attributable to atelectasis though underlying airspace disease and/or aspiration may be present. Some mild bronchiectatic changes are present the in the lung bases as well. Few scattered calcified granulomata. Mild paraseptal emphysema. Some pulmonary vascular redistribution and septal thickening is present. No concerning pulmonary nodules or masses though evaluation limited by underlying opacity and respiratory motion artifact. Musculoskeletal: The osseous structures appear diffusely demineralized which may limit detection of small or nondisplaced fractures. Multilevel degenerative changes are present in the imaged portions of the spine. Degenerative changes in the shoulders as well. No acute osseous abnormality of the included portions of the upper extremities. Review of the MIP images confirms the above findings. CTA ABDOMEN AND PELVIS FINDINGS VASCULAR Aorta: Extensive aortic atherosclerosis within mixture of calcified noncalcified atherosclerotic plaque. No evidence of aneurysm, dissection or vasculitis. Celiac: Mild ostial plaque narrowing. Otherwise patent. No aneurysm, dissection, vasculitis or significant stenosis. Typical branching pattern. SMA: Mild-to-moderate ostial and proximal vessel plaque narrowing. No other significant stenosis or occlusion. No aneurysm or dissection. No features of vasculitis. Renals: Single renal arteries bilaterally. Moderate bilateral ostial and proximal vessel plaque narrowing. Both renal arteries are patent without evidence of aneurysm, dissection, vasculitis, fibromuscular dysplasia or significant stenosis. IMA: Severe stenosis or occlusion of the IMA at the ostium opacification of the vessel possibly supplied  by left colic back fill collateral flow. Inflow: Extensive calcified and  noncalcified atherosclerotic plaque throughout the inflow vasculature wrist results in segmental narrowing of the left internal iliac and a fairly proximal occlusion of the right internal iliac prior to the anterior and posterior divisions (6/148). Included portions of the proximal outflow vessels demonstrate extensive atheromatous plaque as well resulting in some moderate plaque narrowing of the bilateral common femorals as well as segmental narrowing of the superficial and deep femoral artery on the right, superficial femoral artery on the left and a proximal tapering of the left deep superficial femoral artery just beyond the bifurcation (6/205). Veins: No obvious venous abnormality within the limitations of this arterial phase study. Review of the MIP images confirms the above findings. NON-VASCULAR Hepatobiliary: Diffuse hepatic hypoattenuation compatible with hepatic steatosis. Mild sparing along the gallbladder fossa. Few small subcentimeter hypoattenuating foci are too small to characterize on CT imaging. Gallbladder contains partially calcified gallstones within the gallbladder neck/proximal cystic duct (6/82). No pericholecystic fluid or inflammation. No intraductal gallstones. No biliary ductal dilatation. Pancreas: Moderate pancreatic atrophy. No pancreatic ductal dilatation or surrounding inflammatory changes. Spleen: Normal in size. No concerning splenic lesions. Adrenals/Urinary Tract: Lobular thickening of the adrenal glands could reflect some senescent adrenal hyperplasia without concerning dominant nodules. Kidneys enhance symmetrically and uniformly. Fluid attenuation cyst seen arising from the lower pole right kidney (6/111) measuring up to 13 cm in size. No concerning renal mass. No obstructive uropathy distended, bizarre configuration of the bladder with several presumed bladder diverticula seen posteriorly. No visible bladder calculi or debris. Mural calcifications noted along the bladder  wall. Stomach/Bowel: Distal esophagus, stomach and duodenal sweep are unremarkable. No small bowel wall thickening or dilatation. No evidence of obstruction. A normal appendix is visualized. No proximal colonic thickening or dilatation. Moderate distention of the rectosigmoid by an inspissated rectal stool ball with mild mural thickening and surrounding perirectal hazy fat stranding. Lymphatic: No suspicious or enlarged lymph nodes in the included lymphatic chains. Reproductive: Anteverted uterus with multiple calcified uterine fibroids displaced slightly rightward in a configuration similar to comparison imaging. No concerning adnexal mass or lesion. Other: Hazy stranding in the presacral fat, similar to priors albeit with some additional perirectal fat stranding. Musculoskeletal: The osseous structures appear diffusely demineralized which may limit detection of small or nondisplaced fractures. No acute fracture or traumatic malalignment. Multilevel degenerative changes are present in the imaged portions of the spine. Multilevel Schmorl's node formations noted as well. No concerning lytic or blastic lesions. Additional degenerative changes in the hips and pelvis. Review of the MIP images confirms the above findings. IMPRESSION: Vascular: 1. Extensive bilateral pulmonary emboli extending from the distal left main pulmonary artery into the left upper lobar and segmental branches of the lingula. Additional lobar to segmental filling defects are present right upper and middle lobes as well. Assessment for right heart strain is limited by a chronic cardiomegaly, consider echocardiography as clinically warranted. 2.  Atherosclerotic plaque within the normal caliber aorta. 3. Curvilinear filling defect at the level of the aortic arch is stable. Previously seen distal thoracic aortic curvilinear filling defect is no longer present. Could reflect migration or stabilization. 4. Stable penetrating atherosclerotic ulcer  towards the diaphragmatic hiatus/distal thoracic aorta. 5. Mild ostial plaque narrowing of the SMA and celiac, moderate narrowing of the bilateral renal arteries and severe narrowing or occlusion of the IMA origin. Additional plaque occlusions seen in the right internal iliac and left superficial femoral artery with additional mild-to-moderate segmental luminal  stenoses the inflow and proximal outflow vasculature as detailed above. Nonvascular: 1. Extensive ground-glass and consolidative opacity in the dependent portion of the lungs with at least partially attributable to atelectasis though underlying airspace disease and/or aspiration may be present particularly given a fluid-filled appearance of the distal thoracic esophagus. 2. Features of fecal impaction and/or stercoral colitis with moderate distention of the rectosigmoid by an inspissated rectal stool ball with mild mural thickening and surrounding perirectal hazy fat stranding. 3. Cholelithiasis without evidence for acute cholecystitis. 4. Hepatic steatosis. 5. Fibroid uterus. 6. Heterogeneous, enlarged multinodular thyroid gland. Recommend thyroid ultrasound if not previously performed (ref: J Am Coll Radiol. 2015 Feb;12(2): 143-50). These results were called by telephone at the time of interpretation on 01/01/2021 at 11:33 pm to provider Dr. Pilar Plate, Who verbally acknowledged these results. Electronically Signed   By: Kreg Shropshire M.D.   On: 01/01/2021 23:37   VAS [place] LOWER EXTREMITY VENOUS (DVT)  Result Date: 01/04/2021  Lower Venous DVT Study Indications: Pulmonary embolism, and history of DVT.  Limitations: Patient unwilling to cooperate with positioning. Overlying shadowing for overlying arterial calcification. Comparison Study: 09-02-2020 Bilateral lower extremity venous was limited, but                   positive for acute DVT of the RT popliteal. Performing Technologist: Jean Rosenthal RDMS,RVT  Examination Guidelines: A complete evaluation includes  B-mode imaging, spectral Doppler, color Doppler, and power Doppler as needed of all accessible portions of each vessel. Bilateral testing is considered an integral part of a complete examination. Limited examinations for reoccurring indications may be performed as noted. The reflux portion of the exam is performed with the patient in reverse Trendelenburg.  +---------+---------------+---------+-----------+----------+-----------------+ RIGHT    CompressibilityPhasicitySpontaneityPropertiesThrombus Aging    +---------+---------------+---------+-----------+----------+-----------------+ CFV      Full           Yes      Yes                                    +---------+---------------+---------+-----------+----------+-----------------+ SFJ      Full                                                           +---------+---------------+---------+-----------+----------+-----------------+ FV Prox  Full                                                           +---------+---------------+---------+-----------+----------+-----------------+ FV Mid   Full                                                           +---------+---------------+---------+-----------+----------+-----------------+ FV DistalFull           Yes      Yes                                    +---------+---------------+---------+-----------+----------+-----------------+  PFV      Full                                                           +---------+---------------+---------+-----------+----------+-----------------+ POP      None           No       No                   Age Indeterminate +---------+---------------+---------+-----------+----------+-----------------+ PTV                                                   Not visualized    +---------+---------------+---------+-----------+----------+-----------------+ PERO                                                  Not visualized     +---------+---------------+---------+-----------+----------+-----------------+   +---------+---------------+---------+-----------+----------+-------------------+ LEFT     CompressibilityPhasicitySpontaneityPropertiesThrombus Aging      +---------+---------------+---------+-----------+----------+-------------------+ CFV      Full           Yes      Yes                                      +---------+---------------+---------+-----------+----------+-------------------+ SFJ      Full                                                             +---------+---------------+---------+-----------+----------+-------------------+ FV Prox  Partial        Yes      Yes                  Age Indeterminate   +---------+---------------+---------+-----------+----------+-------------------+ FV Mid   Partial        No       No                                       +---------+---------------+---------+-----------+----------+-------------------+ FV DistalFull                                                             +---------+---------------+---------+-----------+----------+-------------------+ PFV      Full                                                             +---------+---------------+---------+-----------+----------+-------------------+  POP                                                   Unable to visualize                                                       due to patient                                                            positioning         +---------+---------------+---------+-----------+----------+-------------------+ PTV                     Yes      Yes                                      +---------+---------------+---------+-----------+----------+-------------------+ PERO                                                  Not well visualized +---------+---------------+---------+-----------+----------+-------------------+      Summary: RIGHT: - Findings consistent with age indeterminate deep vein thrombosis involving the right popliteal vein. - No cystic structure found in the popliteal fossa.  LEFT: - Findings consistent with age indeterminate deep vein thrombosis involving the left femoral vein.  *See table(s) above for measurements and observations. Electronically signed by Fabienne Bruns MD on 01/04/2021 at 12:56:43 PM.    Final     Microbiology: Recent Results (from the past 240 hour(s))  Urine culture     Status: Abnormal   Collection Time: 01/01/21 10:50 PM   Specimen: Urine, Random  Result Value Ref Range Status   Specimen Description   Final    URINE, RANDOM Performed at [facility], [address], [place], [place] [address]    Special Requests   Final    NONE Performed at [facility], [address] [place], [place] [address]    Culture (A)  Final    >=100,000 COLONIES/mL MULTIPLE SPECIES PRESENT, SUGGEST RECOLLECTION   Report Status 01/03/2021 FINAL  Final  Resp Panel by RT-PCR (Flu A&B, Covid) Nasopharyngeal Swab     Status: None   Collection Time: 01/01/21 11:34 PM   Specimen: Nasopharyngeal Swab; Nasopharyngeal(NP) swabs in vial transport medium  Result Value Ref Range Status   SARS Coronavirus 2 by RT PCR NEGATIVE NEGATIVE Final    Comment: (NOTE) SARS-CoV-2 target nucleic acids are NOT DETECTED.  The SARS-CoV-2 RNA is generally detectable in upper respiratory specimens during the acute phase of infection. The lowest concentration of SARS-CoV-2 viral copies this assay can detect is 138 copies/mL. A negative result does not preclude SARS-Cov-2 infection and should not be used as the sole basis for treatment or other patient management  decisions. A negative result may occur with  improper specimen collection/handling, submission of specimen other than nasopharyngeal swab, presence of viral mutation(s) within the areas targeted by this assay, and  inadequate number of viral copies(<138 copies/mL). A negative result must be combined with clinical observations, patient history, and epidemiological information. The expected result is Negative.  Fact Sheet for Patients:  BloggerCourse.com  Fact Sheet for Healthcare Providers:  SeriousBroker.it  This test is no t yet approved or cleared by the [place] FDA and  has been authorized for detection and/or diagnosis of SARS-CoV-2 by FDA under an Emergency Use Authorization (EUA). This EUA will remain  in effect (meaning this test can be used) for the duration of the COVID-19 declaration under Section 564(b)(1) of the Act, 21 U.S.C.section 360bbb-3(b)(1), unless the authorization is terminated  or revoked sooner.       Influenza A by PCR NEGATIVE NEGATIVE Final   Influenza B by PCR NEGATIVE NEGATIVE Final    Comment: (NOTE) The Xpert Xpress SARS-CoV-2/FLU/RSV plus assay is intended as an aid in the diagnosis of influenza from Nasopharyngeal swab specimens and should not be used as a sole basis for treatment. Nasal washings and aspirates are unacceptable for Xpert Xpress SARS-CoV-2/FLU/RSV testing.  Fact Sheet for Patients: BloggerCourse.com  Fact Sheet for Healthcare Providers: SeriousBroker.it  This test is not yet approved or cleared by the [place] FDA and has been authorized for detection and/or diagnosis of SARS-CoV-2 by FDA under an Emergency Use Authorization (EUA). This EUA will remain in effect (meaning this test can be used) for the duration of the COVID-19 declaration under Section 564(b)(1) of the Act, 21 U.S.C. section 360bbb-3(b)(1), unless the authorization is terminated or revoked.  Performed at [facility], [address], [place], [place] [address]      Labs: Basic Metabolic Panel: Recent Labs  Lab 01/01/21 1906  01/03/21 0434  NA 140 138  K 3.7 4.5  CL 115* 106  CO2 18* 23  GLUCOSE 79 92  BUN 10 12  CREATININE 0.54 0.73  CALCIUM 6.7* 8.7*   Liver Function Tests: Recent Labs  Lab 01/01/21 1906 01/03/21 0434  AST 15 21  ALT 7 7  ALKPHOS 63 69  BILITOT 1.0 0.8  PROT 5.1* 6.2*  ALBUMIN 2.4* 2.9*   No results for input(s): LIPASE, AMYLASE in the last 168 hours. No results for input(s): AMMONIA in the last 168 hours. CBC: Recent Labs  Lab 01/01/21 1742 01/02/21 0548 01/03/21 0434  WBC 6.9 7.4 6.4  NEUTROABS 4.8 3.9  --   HGB 12.6 15.0 14.4  HCT 39.3 44.3 43.9  MCV 101.3* 94.7 98.7  PLT 148* 219 194   Cardiac Enzymes: No results for input(s): CKTOTAL, CKMB, CKMBINDEX, TROPONINI in the last 168 hours. BNP: BNP (last 3 results) Recent Labs    09/08/20 2125  BNP 1,007.4*    ProBNP (last 3 results) No results for input(s): PROBNP in the last 8760 hours.  CBG: Recent Labs  Lab 01/03/21 2214 01/04/21 0717 01/04/21 1128 01/04/21 1627 01/05/21 0828  GLUCAP 123* 76 100* 108* 79       Signed:  Meredeth Ide MD.  [facility] 01/05/2021, 10:53 AM

## 2021-01-05 NOTE — TOC Progression Note (Signed)
Transition of Care [facility]) - Progression Note    Patient Details  Name: Kaitlin Smith MRN: [id number] Date of Birth: September 19, 1930  Transition of Care [facility]) CM/SW Contact  Armanda Heritage, RN Phone Number: 01/05/2021, 10:27 AM  Clinical Narrative:    CM spoke with patient's niece who is her primary care provider.  Niece reports she has been caring for patient since 16 and patient has been bed bound since December of 2021 after patient had Covid.  Per niece, she would like to take patient home at discharge and continue to care for her.  Niece is asking about the possibility of obtaining pure wick catheters for patient at home, CM spoke with [employer] rep, agency does not carry this, was advised the manufacturer is the only supplier at this time and family would need to work directly with manufacturer.  Niece made aware of this.   CM noted PT evaluation recommendation of SNF, spoke with physician advisor and made aware of recommendation, family preference, and patient's baseline function.  Per physician advisor SNF rehab is not an appropriate level of care.  Family is planning for patient's return home at discharge by PTAR.  CM will coordinate transportation at discharge.     Expected Discharge Plan: Home/Self Care Barriers to Discharge: No Barriers Identified  Expected Discharge Plan and Services Expected Discharge Plan: Home/Self Care   Discharge Planning Services: CM Consult   Living arrangements for the past 2 months: Single Family Home                                       Social Determinants of Health (SDOH) Interventions    Readmission Risk Interventions No flowsheet data found.

## 2021-01-05 NOTE — TOC Progression Note (Signed)
Transition of Care [facility]) - Progression Note    Patient Details  Name: Kaitlin Smith MRN: [id number] Date of Birth: 1929-12-30  Transition of Care [facility]) CM/SW Contact  Armanda Heritage, RN Phone Number: 01/05/2021, 3:21 PM  Clinical Narrative:    Referral for HHPT given to [facility] rep Kandee Keen.  PTAR transportation arranged.   Expected Discharge Plan: Home/Self Care Barriers to Discharge: No Barriers Identified  Expected Discharge Plan and Services Expected Discharge Plan: Home/Self Care   Discharge Planning Services: CM Consult   Living arrangements for the past 2 months: Single Family Home Expected Discharge Date: 01/05/21                         HH Arranged: PT HH Agency: [facility] Date [facility] Agency Contacted: 01/05/21 Time HH Agency Contacted: 1521 Representative spoke with at [facility] Agency: Kandee Keen   Social Determinants of Health (SDOH) Interventions    Readmission Risk Interventions No flowsheet data found.

## 2021-01-05 NOTE — Care Management Important Message (Signed)
Important Message  Patient Details IM Letter given to the Patient. Name: Nadine Ryle MRN: [id number] Date of Birth: 1930-06-29   Medicare Important Message Given:  Yes     Caren Macadam 01/05/2021, 10:05 AM

## 2021-01-07 ENCOUNTER — Other Ambulatory Visit: Payer: Self-pay

## 2021-01-07 ENCOUNTER — Other Ambulatory Visit: Payer: Self-pay | Admitting: Student

## 2021-01-07 DIAGNOSIS — Z515 Encounter for palliative care: Secondary | ICD-10-CM

## 2021-01-07 DIAGNOSIS — F0150 Vascular dementia without behavioral disturbance: Secondary | ICD-10-CM

## 2021-01-07 DIAGNOSIS — K5900 Constipation, unspecified: Secondary | ICD-10-CM

## 2021-01-07 DIAGNOSIS — I2699 Other pulmonary embolism without acute cor pulmonale: Secondary | ICD-10-CM

## 2021-01-07 NOTE — Progress Notes (Signed)
[facility] Consult Note Telephone: [phone]  Fax: [phone]    Date of encounter: 01/07/21 PATIENT NAME: Kaitlin Smith [address] [place] [address]   [phone] (home)  DOB: [birth year] MRN: [id number] PRIMARY CARE PROVIDER:    Velna Hatchet, MD,  [place] [address] [phone]  REFERRING PROVIDER:   Velna Smith, [place],   [address] [phone]  RESPONSIBLE PARTY:    Contact Information    Name Relation Home Work Mobile   Kaitlin Smith Niece [phone]     [name]   [phone]       I met face to face with patient and family in the home. Palliative Care was asked to follow this patient by consultation request of  Kaitlin Hatchet, MD to address advance care planning and complex medical decision making. This is a follow up visit.                                   ASSESSMENT AND PLAN / RECOMMENDATIONS:   Advance Care Planning/Goals of Care: Goals include to maximize quality of life and symptom management. Our advance care planning conversation included a discussion about:     The value and importance of advance care planning   Experiences with loved ones who have been seriously ill or have died   Exploration of personal, cultural or spiritual beliefs that might influence medical decisions   Exploration of goals of care in the event of a sudden injury or illness   Identification and preparation of a healthcare agent   MOST form completed today  Decision not to resuscitate or to de-escalate disease focused treatments due to poor prognosis.  CODE STATUS: DNR  Symptom Management/Plan:  Vascular Dementia-patient with generalized weakness secondary to dementia. Recommend PT re-evaluation given recent hospitalization. Reorient/redirect as needed, assist with adl's. Monitor for changes/declines. Monitor for  falls/safety.  PE/DVT-Bilateral subsegmental PE's, LE venous duplex shows age-indeterminate bilateral DVT. Patient started on Xarelto starter pack; continue Xarelto as directed. Follow up with Cardiology as scheduled.   Constipation-continue miralax 17gm daily; diet with fiber encouraged. Adequate fluids encouraged.    Follow up Palliative Care Visit: Palliative care will continue to follow for complex medical decision making, advance care planning, and clarification of goals. Return in 4 weeks or prn.  I spent 40 minutes providing this consultation. More than 50% of the time in this consultation was spent in counseling and care coordination.    PPS: 40%  HOSPICE ELIGIBILITY/DIAGNOSIS: TBD  Chief Complaint: Palliative Medicine follow up visit; dementia  HISTORY OF PRESENT ILLNESS:  Kaitlin Smith is a 87 y.o. year old female  with vascular dementia. Diagnoses also include T2DM, physical deconditioning, acute respiratory failure with hypoxia, HTN, CVA, PVD, AAA, DVT, NSTEMI . Patient recently hospitalized 4/15-4/19/22 due to PE, DVT.   Patient returned home with niece, Kaitlin Smith. She denies pain, chest pain, shortness of breath. Patient was started on Xarelto; she is to continue x 6 months. Her appetite is good. AM blood sugars have been in the 90's. No hypoglycemic episodes. Niece Kaitlin Smith states that therapy had ended last week, patient is able to assist more with transfers out of bed to recliner or bedside commode. Patient with generalized weakness status post hospitalization. No recent falls or injury reported. Kaitlin Smith is giving miralax daily for constipation.   History obtained from review of EMR, discussion with primary  team, and interview with family, facility staff/caregiver and/or [name].  I reviewed available labs, medications, imaging, studies and related documents from the EMR.  Records reviewed and summarized above.   ROS  General: NAD EYES: impaired vision ENMT: denies  dysphagia Cardiovascular: denies chest pain, denies DOE Pulmonary: denies cough, denies increased SOB Abdomen: endorses fair appetite, constipation, endorses continence of bowel GU: denies dysuria, urinary incontinence MSK: weakness,  no falls reported Skin: denies rashes or wounds Neurological: denies pain, denies insomnia Psych: stable mood Heme/lymph/immuno: denies bruises, abnormal bleeding  Physical Exam: Pulse 64, resp 16, b/p 120/68 sats 98-99% on room air Current and past weights:  Constitutional: NAD General: frail appearing, well groomed EYES: anicteric sclera, lids intact, no discharge  ENMT: intact hearing, oral mucous membranes moist, dentition intact CV: S1S2, RRR, trace edema to LE edema Pulmonary: LCTA, no increased work of breathing, no cough Abdomen: normo-active BS + 4 quadrants, soft and non tender GU: deferred MSK: sarcopenia, moves all extremities, non-ambulatory Skin: warm and dry, no rashes or wounds on visible skin Neuro:  no generalized weakness,  Psych: non-anxious affect, A & O to person, familars Hem/lymph/immuno: no widespread bruising   Thank you for the opportunity to participate in the care of [name].  The palliative care team will continue to follow. Please call our office at [phone] if we can be of additional assistance.   Ezekiel Slocumb, NP   COVID-19 PATIENT SCREENING TOOL Asked and negative response unless otherwise noted:   Have you had symptoms of covid, tested positive or been in contact with someone with symptoms/positive test in the past 5-10 days?

## 2021-02-10 ENCOUNTER — Other Ambulatory Visit: Payer: Federal, State, Local not specified - PPO | Admitting: Student

## 2021-02-10 ENCOUNTER — Other Ambulatory Visit: Payer: Self-pay

## 2021-02-10 DIAGNOSIS — Z515 Encounter for palliative care: Secondary | ICD-10-CM

## 2021-02-10 DIAGNOSIS — I2694 Multiple subsegmental pulmonary emboli without acute cor pulmonale: Secondary | ICD-10-CM

## 2021-02-10 DIAGNOSIS — J309 Allergic rhinitis, unspecified: Secondary | ICD-10-CM

## 2021-02-10 DIAGNOSIS — F0150 Vascular dementia without behavioral disturbance: Secondary | ICD-10-CM

## 2021-02-10 DIAGNOSIS — R531 Weakness: Secondary | ICD-10-CM

## 2021-02-10 NOTE — Progress Notes (Signed)
[facility] Consult Note Telephone: [phone]  Fax: [phone]    Date of encounter: 02/10/21 PATIENT NAME: Kaitlin Smith [address] [place] [address]   [phone] (home)  DOB: [birth year] MRN: [id number] PRIMARY CARE PROVIDER:    Velna Hatchet, MD,  [place] [address] [phone]  REFERRING PROVIDER:   Velna Smith, [place],  [place] [address] [phone]  RESPONSIBLE PARTY:    Contact Information    Name Relation Home Work Mobile   Kaitlin Smith [phone]     Kaitlin Smith   [phone]       I met face to face with patient and family in the home. Palliative Care was asked to follow this patient by consultation request of  Kaitlin Hatchet, MD to address advance care planning and complex medical decision making. This is a follow up visit.                                   ASSESSMENT AND PLAN / RECOMMENDATIONS:   Advance Care Planning/Goals of Care: Goals include to maximize quality of life and symptom management. Our advance care planning conversation included a discussion about:     The value and importance of advance care planning   Experiences with loved ones who have been seriously ill or have died   Exploration of personal, cultural or spiritual beliefs that might influence medical decisions   Exploration of goals of care in the event of a sudden injury or illness   Identification and preparation of a healthcare agent   Review and updating or creation of an  advance directive document   CODE STATUS: DNR  Symptom Management/Plan:   Vascular Dementia-patient with generalized weakness secondary to dementia. Continue PT as directed.  Reorient/redirect as needed, assist with adl's. Monitor for changes/declines. Monitor for falls/safety.  PE/DVT-Bilateral subsegmental PE's, LE venous duplex shows age-indeterminate bilateral DVT.  Continue Xarelto as directed. Follow up with Cardiology as scheduled.   Allergic rhinitis-symptoms consistent with allergic rhinitis. Recommend loratadine 54m daily.   Follow up Palliative Care Visit: Palliative care will continue to follow for complex medical decision making, advance care planning, and clarification of goals. Return in 8 weeks or prn.   I spent 40 minutes providing this consultation. More than 50% of the time in this consultation was spent in counseling and care coordination.    PPS: 40%  HOSPICE ELIGIBILITY/DIAGNOSIS: TBD  Chief Complaint: Palliative Medicine follow up visit.   HISTORY OF PRESENT ILLNESS:  Kaitlin Guardadois a 87y.o. year old female  with vascular dementia. Diagnoses also include T2DM, physical deconditioning, acute respiratory failure with hypoxia,HTN, CVA, PVD, AAA, DVT, NSTEMI . Patient most recent hospitalization 4/15-4/19/22 due to PE, DVT.   Patient reports doing well. She denies pain, shortness of breath. She endorses a fair appetite; she is drinking one nutritional supplement daily. She was started on nitrofurantoin 1026mdaily for UTI prophylaxis. She is currently receiving PT, SN and bath aid with [facility]. No recent falls.  No ER visits or hospitalizations since last Palliative visit.    History obtained from review of EMR, discussion with primary team, and interview with family, facility staff/caregiver and/or [name] I reviewed available labs, medications, imaging, studies and related documents from the EMR.  Records reviewed and summarized above.   ROS  General: NAD EYES: impaired vision, itchy watery  eyes ENMT: denies dysphagia, runny nose Cardiovascular: denies chest pain, denies DOE Pulmonary: denies cough, denies increased SOB Abdomen: endorses fair appetite, denies constipation GU: denies dysuria, incontinence of urine MSK: weakness,  no falls reported Skin: denies rashes or wounds Neurological: denies pain,  denies insomnia Psych: stable mood Heme/lymph/immuno: denies bruises, abnormal bleeding  Physical Exam: Pulse 72, resp 16, b/p 108/70, sats 95% on room air Constitutional: NAD General: frail appearing, thin EYES: anicteric sclera, lids intact, no discharge  ENMT: intact hearing, oral mucous membranes moist, dentition intact CV: S1S2, RRR, trace LE edema Pulmonary: LCTA, no increased work of breathing, no cough Abdomen: normo-active BS + 4 quadrants, soft and non tender GU: deferred MSK: non ambulatory Skin: warm and dry, no rashes or wounds on visible skin Neuro: generalized weakness Psych: non-anxious affect, A &O x 2 Hem/lymph/immuno: no widespread bruising   Thank you for the opportunity to participate in the care of Kaitlin Smith.  The palliative care team will continue to follow. Please call our office at [phone] if we can be of additional assistance.   Ezekiel Slocumb, NP  COVID-19 PATIENT SCREENING TOOL Asked and negative response unless otherwise noted:   Have you had symptoms of covid, tested positive or been in contact with someone with symptoms/positive test in the past 5-10 days? No

## 2021-03-08 ENCOUNTER — Telehealth: Payer: Self-pay

## 2021-03-08 NOTE — Telephone Encounter (Signed)
Received message to call patient's caregiver, [name]. [name] was in ED last night and concerned for patient if she had to be admitted. TC placed to [employer].VM left

## 2021-04-22 ENCOUNTER — Telehealth: Payer: Self-pay

## 2021-04-22 NOTE — Telephone Encounter (Signed)
Phone call placed to patient's daughter to f/u from message received. VM left with call back information.

## 2021-04-23 ENCOUNTER — Telehealth: Payer: Self-pay

## 2021-04-23 NOTE — Telephone Encounter (Signed)
119 pm.  Message received requesting call to niece [name].  Return call made and no answer.  Unable to leave message as VM is full.

## 2021-05-11 ENCOUNTER — Other Ambulatory Visit: Payer: Self-pay

## 2021-05-11 ENCOUNTER — Encounter (HOSPITAL_COMMUNITY): Payer: Self-pay

## 2021-05-11 ENCOUNTER — Inpatient Hospital Stay (HOSPITAL_COMMUNITY)
Admission: EM | Admit: 2021-05-11 | Discharge: 2021-05-15 | DRG: 315 | Disposition: A | Discharge status: Home / Self Care | Payer: Medicare Other | Attending: Internal Medicine | Admitting: Internal Medicine

## 2021-05-11 ENCOUNTER — Observation Stay (HOSPITAL_COMMUNITY): Payer: Medicare Other

## 2021-05-11 DIAGNOSIS — E782 Mixed hyperlipidemia: Secondary | ICD-10-CM

## 2021-05-11 DIAGNOSIS — R140 Abdominal distension (gaseous): Secondary | ICD-10-CM | POA: Diagnosis not present

## 2021-05-11 DIAGNOSIS — Z888 Allergy status to other drugs, medicaments and biological substances status: Secondary | ICD-10-CM

## 2021-05-11 DIAGNOSIS — R55 Syncope and collapse: Secondary | ICD-10-CM | POA: Diagnosis not present

## 2021-05-11 DIAGNOSIS — I6523 Occlusion and stenosis of bilateral carotid arteries: Secondary | ICD-10-CM | POA: Diagnosis present

## 2021-05-11 DIAGNOSIS — Z8616 Personal history of COVID-19: Secondary | ICD-10-CM

## 2021-05-11 DIAGNOSIS — I440 Atrioventricular block, first degree: Secondary | ICD-10-CM | POA: Diagnosis present

## 2021-05-11 DIAGNOSIS — Z8673 Personal history of transient ischemic attack (TIA), and cerebral infarction without residual deficits: Secondary | ICD-10-CM

## 2021-05-11 DIAGNOSIS — F0390 Unspecified dementia without behavioral disturbance: Secondary | ICD-10-CM | POA: Diagnosis present

## 2021-05-11 DIAGNOSIS — Z20822 Contact with and (suspected) exposure to covid-19: Secondary | ICD-10-CM | POA: Diagnosis present

## 2021-05-11 DIAGNOSIS — Z79899 Other long term (current) drug therapy: Secondary | ICD-10-CM

## 2021-05-11 DIAGNOSIS — H409 Unspecified glaucoma: Secondary | ICD-10-CM | POA: Diagnosis present

## 2021-05-11 DIAGNOSIS — I9589 Other hypotension: Principal | ICD-10-CM

## 2021-05-11 DIAGNOSIS — N390 Urinary tract infection, site not specified: Secondary | ICD-10-CM | POA: Diagnosis present

## 2021-05-11 DIAGNOSIS — I252 Old myocardial infarction: Secondary | ICD-10-CM

## 2021-05-11 DIAGNOSIS — K567 Ileus, unspecified: Secondary | ICD-10-CM | POA: Diagnosis present

## 2021-05-11 DIAGNOSIS — Z66 Do not resuscitate: Secondary | ICD-10-CM | POA: Diagnosis present

## 2021-05-11 DIAGNOSIS — E785 Hyperlipidemia, unspecified: Secondary | ICD-10-CM | POA: Diagnosis present

## 2021-05-11 DIAGNOSIS — I5042 Chronic combined systolic (congestive) and diastolic (congestive) heart failure: Secondary | ICD-10-CM | POA: Diagnosis present

## 2021-05-11 DIAGNOSIS — R451 Restlessness and agitation: Secondary | ICD-10-CM | POA: Diagnosis not present

## 2021-05-11 DIAGNOSIS — I110 Hypertensive heart disease with heart failure: Secondary | ICD-10-CM | POA: Diagnosis present

## 2021-05-11 DIAGNOSIS — Z8679 Personal history of other diseases of the circulatory system: Secondary | ICD-10-CM

## 2021-05-11 DIAGNOSIS — R001 Bradycardia, unspecified: Secondary | ICD-10-CM | POA: Diagnosis present

## 2021-05-11 DIAGNOSIS — Z7901 Long term (current) use of anticoagulants: Secondary | ICD-10-CM

## 2021-05-11 DIAGNOSIS — Z87891 Personal history of nicotine dependence: Secondary | ICD-10-CM

## 2021-05-11 DIAGNOSIS — Z823 Family history of stroke: Secondary | ICD-10-CM

## 2021-05-11 DIAGNOSIS — K5900 Constipation, unspecified: Secondary | ICD-10-CM | POA: Diagnosis present

## 2021-05-11 DIAGNOSIS — Z86718 Personal history of other venous thrombosis and embolism: Secondary | ICD-10-CM

## 2021-05-11 DIAGNOSIS — I2510 Atherosclerotic heart disease of native coronary artery without angina pectoris: Secondary | ICD-10-CM | POA: Diagnosis present

## 2021-05-11 DIAGNOSIS — Z86711 Personal history of pulmonary embolism: Secondary | ICD-10-CM

## 2021-05-11 DIAGNOSIS — Z833 Family history of diabetes mellitus: Secondary | ICD-10-CM

## 2021-05-11 DIAGNOSIS — E1151 Type 2 diabetes mellitus with diabetic peripheral angiopathy without gangrene: Secondary | ICD-10-CM | POA: Diagnosis present

## 2021-05-11 DIAGNOSIS — E119 Type 2 diabetes mellitus without complications: Secondary | ICD-10-CM

## 2021-05-11 DIAGNOSIS — Z8249 Family history of ischemic heart disease and other diseases of the circulatory system: Secondary | ICD-10-CM

## 2021-05-11 DIAGNOSIS — I959 Hypotension, unspecified: Secondary | ICD-10-CM

## 2021-05-11 DIAGNOSIS — E860 Dehydration: Secondary | ICD-10-CM | POA: Diagnosis present

## 2021-05-11 DIAGNOSIS — N289 Disorder of kidney and ureter, unspecified: Secondary | ICD-10-CM | POA: Diagnosis present

## 2021-05-11 DIAGNOSIS — E861 Hypovolemia: Secondary | ICD-10-CM

## 2021-05-11 DIAGNOSIS — I10 Essential (primary) hypertension: Secondary | ICD-10-CM | POA: Diagnosis present

## 2021-05-11 LAB — COMPREHENSIVE METABOLIC PANEL
ALT: 12 U/L (ref 0–44)
AST: 16 U/L (ref 15–41)
Albumin: 3.3 g/dL — ABNORMAL LOW (ref 3.5–5.0)
Alkaline Phosphatase: 67 U/L (ref 38–126)
Anion gap: 6 (ref 5–15)
BUN: 7 mg/dL — ABNORMAL LOW (ref 8–23)
CO2: 29 mmol/L (ref 22–32)
Calcium: 9.3 mg/dL (ref 8.9–10.3)
Chloride: 106 mmol/L (ref 98–111)
Creatinine, Ser: 0.43 mg/dL — ABNORMAL LOW (ref 0.44–1.00)
GFR, Estimated: >60 mL/min (ref >60)
Glucose, Bld: 98 mg/dL (ref 70–99)
Potassium: 5.1 mmol/L (ref 3.5–5.1)
Sodium: 141 mmol/L (ref 135–145)
Total Bilirubin: 0.7 mg/dL (ref 0.3–1.2)
Total Protein: 6.1 g/dL — ABNORMAL LOW (ref 6.5–8.1)

## 2021-05-11 LAB — LACTIC ACID, PLASMA: Lactic Acid, Venous: 1 mmol/L (ref 0.5–1.9)

## 2021-05-11 LAB — URINALYSIS, ROUTINE W REFLEX MICROSCOPIC
Bilirubin Urine: NEGATIVE
Glucose, UA: NEGATIVE mg/dL
Ketones, ur: NEGATIVE mg/dL
Nitrite: NEGATIVE
Protein, ur: NEGATIVE mg/dL
Specific Gravity, Urine: 1.006 (ref 1.005–1.030)
WBC, UA: >50 WBC/hpf — ABNORMAL HIGH (ref 0–5)
pH: 7 (ref 5.0–8.0)

## 2021-05-11 LAB — CBC WITH DIFFERENTIAL/PLATELET
Abs Immature Granulocytes: 0.04 10*3/uL (ref 0.00–0.07)
Basophils Absolute: 0 10*3/uL (ref 0.0–0.1)
Basophils Relative: 0 %
Eosinophils Absolute: 0.1 10*3/uL (ref 0.0–0.5)
Eosinophils Relative: 1 %
HCT: 41.1 % (ref 36.0–46.0)
Hemoglobin: 13.3 g/dL (ref 12.0–15.0)
Immature Granulocytes: 1 %
Lymphocytes Relative: 13 %
Lymphs Abs: 0.9 10*3/uL (ref 0.7–4.0)
MCH: 32 pg (ref 26.0–34.0)
MCHC: 32.4 g/dL (ref 30.0–36.0)
MCV: 98.8 fL (ref 80.0–100.0)
Monocytes Absolute: 0.4 10*3/uL (ref 0.1–1.0)
Monocytes Relative: 6 %
Neutro Abs: 5.5 10*3/uL (ref 1.7–7.7)
Neutrophils Relative %: 79 %
Platelets: 216 10*3/uL (ref 150–400)
RBC: 4.16 MIL/uL (ref 3.87–5.11)
RDW: 12.3 % (ref 11.5–15.5)
WBC: 6.9 10*3/uL (ref 4.0–10.5)
nRBC: 0 % (ref 0.0–0.2)

## 2021-05-11 LAB — TROPONIN I (HIGH SENSITIVITY): Troponin I (High Sensitivity): 10 ng/L (ref <18)

## 2021-05-11 LAB — PROTIME-INR
INR: 1.9 — ABNORMAL HIGH (ref 0.8–1.2)
Prothrombin Time: 21.3 seconds — ABNORMAL HIGH (ref 11.4–15.2)

## 2021-05-11 LAB — APTT: aPTT: 35 seconds (ref 24–36)

## 2021-05-11 LAB — CBG MONITORING, ED
Glucose-Capillary: 119 mg/dL — ABNORMAL HIGH (ref 70–99)
Glucose-Capillary: 82 mg/dL (ref 70–99)

## 2021-05-11 LAB — PROCALCITONIN: Procalcitonin: <0.1 ng/mL

## 2021-05-11 MED ORDER — LACTATED RINGERS IV BOLUS
1000.0000 mL | Freq: Once | INTRAVENOUS | Status: AC
  Administered 2021-05-11: 1000 mL via INTRAVENOUS

## 2021-05-11 MED ORDER — BRINZOLAMIDE 1 % OP SUSP
1.0000 [drp] | Freq: Three times a day (TID) | OPHTHALMIC | Status: DC
  Administered 2021-05-12 – 2021-05-14 (×10): 1 [drp] via OPHTHALMIC
  Filled 2021-05-11: qty 10

## 2021-05-11 MED ORDER — PANTOPRAZOLE SODIUM 40 MG PO TBEC
40.0000 mg | DELAYED_RELEASE_TABLET | Freq: Every day | ORAL | Status: DC
  Administered 2021-05-12 – 2021-05-14 (×3): 40 mg via ORAL
  Filled 2021-05-11 (×3): qty 1

## 2021-05-11 MED ORDER — INSULIN ASPART 100 UNIT/ML IJ SOLN
0.0000 [IU] | INTRAMUSCULAR | Status: DC
  Administered 2021-05-13: 1 [IU] via SUBCUTANEOUS
  Filled 2021-05-11: qty 0.09

## 2021-05-11 MED ORDER — RIVAROXABAN 20 MG PO TABS
20.0000 mg | ORAL_TABLET | Freq: Every day | ORAL | Status: DC
  Administered 2021-05-12 – 2021-05-14 (×3): 20 mg via ORAL
  Filled 2021-05-11 (×3): qty 1

## 2021-05-11 MED ORDER — SODIUM CHLORIDE 0.9 % IV SOLN
75.0000 mL/h | INTRAVENOUS | Status: AC
  Administered 2021-05-12: 75 mL/h via INTRAVENOUS

## 2021-05-11 MED ORDER — TAMSULOSIN HCL 0.4 MG PO CAPS
0.4000 mg | ORAL_CAPSULE | Freq: Every day | ORAL | Status: DC
  Administered 2021-05-12 – 2021-05-14 (×3): 0.4 mg via ORAL
  Filled 2021-05-11 (×3): qty 1

## 2021-05-11 MED ORDER — ACETAMINOPHEN 325 MG PO TABS
650.0000 mg | ORAL_TABLET | Freq: Four times a day (QID) | ORAL | Status: DC | PRN
  Administered 2021-05-12 – 2021-05-15 (×4): 650 mg via ORAL
  Filled 2021-05-11 (×4): qty 2

## 2021-05-11 MED ORDER — ACETAMINOPHEN 650 MG RE SUPP: 650.0000 mg | Freq: Four times a day (QID) | RECTAL | Status: DC | PRN

## 2021-05-11 MED ORDER — BRIMONIDINE TARTRATE 0.2 % OP SOLN
1.0000 [drp] | Freq: Three times a day (TID) | OPHTHALMIC | Status: DC
  Administered 2021-05-12 – 2021-05-14 (×10): 1 [drp] via OPHTHALMIC
  Filled 2021-05-11: qty 5

## 2021-05-11 MED ORDER — SODIUM CHLORIDE 0.9 % IV SOLN
2.0000 g | INTRAVENOUS | Status: AC
  Administered 2021-05-11 – 2021-05-13 (×3): 2 g via INTRAVENOUS
  Filled 2021-05-11 (×3): qty 20

## 2021-05-11 NOTE — ED Triage Notes (Signed)
Patient BIB [facility] from for syncopal episode. EMS reports that family found patient in recliner unresponsive. EMS stated patient was A x O x2 on scene. Family stated that was her baseline. Patient had Covid in December and health has been declining since.

## 2021-05-11 NOTE — ED Provider Notes (Signed)
[facility] Note   CSN: [id number] Arrival date & time: 05/11/21  1626     History Chief Complaint  Patient presents with   syncopal episode    Kaitlin Smith is a 87 y.o. female.  HPI     LEVEL 5 CAVEAT FOR DEMENTIA.  87 y.o with history of CAD, diabetes mellitus type 2, hyperlipidemia, dementia, PEs comes in with cc of syncope.  Spoke with Ms. Harris, patient's niece. She reports that she went to check on patient and noted that she was "going out".  Pt has had low BP episodes since April of this year and family discontinued the carvedilol. Pt had elevated BP this morning and she was given losartan. When Ms. Harris checked her BP - patient was noted to be hypotensive (55/41) and HR was in the 40s. The episode lasted for 15 min.  No seizure like episode.  Previous episode was in Apr.    Past Medical History:  Diagnosis Date   Allergy    Arthritis    Blindness    Coronary artery disease    Diabetes mellitus without complication (HCC)    Glaucoma    Hyperlipidemia    Hypertension    Myocardial infarction [facility])    Peripheral vascular disease (HCC)    Stroke [facility])     Patient Active Problem List   Diagnosis Date Noted   Dehydration 05/11/2021   Pulmonary embolism (HCC) 01/02/2021   Acute on chronic combined systolic and diastolic CHF (congestive heart failure) (HCC)    Severe sepsis (HCC)    Non-ST elevation (NSTEMI) myocardial infarction (HCC)    Acute respiratory failure with hypoxia (HCC)    Palliative care by specialist    Goals of care, counseling/discussion    General weakness    Controlled type 2 diabetes mellitus with hyperglycemia (HCC) 09/01/2020   COVID 09/01/2020   Acute lower UTI 09/01/2020   Acute hypoxemic respiratory failure due to COVID-19 (HCC) 09/01/2020   COVID-19 virus infection 08/31/2020   Aortic dissection (HCC) 08/17/2018   Hypertensive emergency 08/17/2018   Abnormal EKG 01/30/2018    Osteoarthritis of right knee 12/04/2017   MCI (mild cognitive impairment) 06/30/2015   Essential hypertension 08/20/2014   Hyperlipidemia 08/20/2014   Peripheral arterial disease (HCC) 08/20/2014   Carotid artery disease (HCC) 06/06/2014   DM2 (diabetes mellitus, type 2) (HCC) 05/13/2014   History of CVA (cerebrovascular accident) 05/13/2014   History of MI (myocardial infarction) 05/13/2014    Past Surgical History:  Procedure Laterality Date   EYE SURGERY       OB History   No obstetric history on file.     Family History  Problem Relation Age of Onset   Diabetes Mother    Stroke Mother    Peripheral vascular disease Mother        amputation   Cancer Brother    Diabetes Brother    Alcohol abuse Brother    Varicose Veins Brother    Hypertension Father    Varicose Veins Father    Heart attack Father    Cancer Father    Diabetes Sister    Hypertension Sister     Social History   Tobacco Use   Smoking status: Former    Types: Cigarettes    Quit date: 06/06/2002    Years since quitting: 18.9   Smokeless tobacco: Never   Tobacco comments:    Quit at age 18  Vaping Use   Vaping Use: Never used  Substance Use Topics   Alcohol use: No    Alcohol/week: 0.0 standard drinks   Drug use: No    Home Medications Prior to Admission medications   Medication Sig Start Date End Date Taking? Authorizing Provider  acetaminophen (TYLENOL) 325 MG tablet Take 2 tablets (650 mg total) by mouth every 6 (six) hours as needed for mild pain (temp > 101.5). 08/22/18   Elgergawy, Leana Roe, MD  carvedilol (COREG) 12.5 MG tablet Take 1 tablet (12.5 mg total) by mouth 2 (two) times daily with a meal. 09/13/20   Azucena Fallen, MD  losartan (COZAAR) 50 MG tablet Take 1 tablet (50 mg total) by mouth daily. 09/14/20   Azucena Fallen, MD  LUMIGAN 0.01 % SOLN Place 1 drop into both eyes at bedtime. 09/10/19   [provider]  pantoprazole (PROTONIX) 40 MG tablet TAKE 1  TABLET BY MOUTH EVERY DAY 11/15/19   Runell Gess, MD  RIVAROXABAN Carlena Hurl) VTE STARTER PACK (15 & 20 MG) Follow package directions: Take one 15mg  tablet by mouth twice a day. On day 22, switch to one 20mg  tablet once a day. Take with food. 01/05/21   , MD  SIMBRINZA 1-0.2 % SUSP Place 1 drop into both eyes daily. 02/13/20   [provider]  tamsulosin (FLOMAX) 0.4 MG CAPS capsule Take 0.4 mg by mouth daily.    [provider]  VOLTAREN 1 % GEL APPLY 4 GRAMS TOPICALLY 2 TIMES DAILY AS NEEDED. 04/25/16   02/15/20, MD    Allergies    Clonidine derivatives and Milk-related compounds  Review of Systems   Review of Systems  Unable to perform ROS: Dementia   Physical Exam Updated Vital Signs BP (!) 190/96   Pulse 96   Temp 98.3 F (36.8 C) (Oral)   Resp 19   Ht 5\' 5"  (1.651 m)   Wt 66.7 kg   SpO2 97%   BMI 24.47 kg/m   Physical Exam Vitals and nursing note reviewed.  Constitutional:      Appearance: She is well-developed.  HENT:     Head: Atraumatic.  Cardiovascular:     Rate and Rhythm: Normal rate.  Pulmonary:     Effort: Pulmonary effort is normal.  Musculoskeletal:     Cervical back: Normal range of motion and neck supple.     Right lower leg: No edema.     Left lower leg: No edema.  Skin:    General: Skin is warm and dry.  Neurological:     Mental Status: She is alert. She is disoriented.    ED Results / Procedures / Treatments   Labs (all labs ordered are listed, but only abnormal results are displayed) Labs Reviewed  COMPREHENSIVE METABOLIC PANEL - Abnormal; Notable for the following components:      Result Value   BUN 7 (*)    Creatinine, Ser 0.43 (*)    Total Protein 6.1 (*)    Albumin 3.3 (*)    All other components within normal limits  URINALYSIS, ROUTINE W REFLEX MICROSCOPIC - Abnormal; Notable for the following components:   APPearance CLOUDY (*)    Hgb urine dipstick MODERATE (*)    Leukocytes,Ua LARGE (*)     WBC, UA >50 (*)    Bacteria, UA FEW (*)    All other components within normal limits  PROTIME-INR - Abnormal; Notable for the following components:   Prothrombin Time 21.3 (*)    INR 1.9 (*)  All other components within normal limits  URINE CULTURE  CULTURE, BLOOD (ROUTINE X 2)  CULTURE, BLOOD (ROUTINE X 2)  SARS CORONAVIRUS 2 (TAT 6-24 HRS)  CBC WITH DIFFERENTIAL/PLATELET  APTT  LACTIC ACID, PLASMA  LACTIC ACID, PLASMA  PROCALCITONIN  HEMOGLOBIN A1C  CBG MONITORING, ED  TROPONIN I (HIGH SENSITIVITY)    EKG EKG Interpretation  Date/Time:  Tuesday May 11 2021 17:32:15 EDT Ventricular Rate:  64 PR Interval:  269 QRS Duration: 98 QT Interval:  407 QTC Calculation: 420 R Axis:   -41 Text Interpretation: Sinus rhythm Prolonged PR interval Abnormal R-wave progression, late transition Inferior infarct, old Abnormal T, consider ischemia, anterior leads Lateral leads are also involved No acute changes Confirmed by Derwood Kaplan [phone]) on 05/11/2021 10:23:25 PM  Radiology No results found.  Procedures Procedures   Medications Ordered in ED Medications  cefTRIAXone (ROCEPHIN) 2 g in sodium chloride 0.9 % 100 mL IVPB (has no administration in time range)  insulin aspart (novoLOG) injection 0-9 Units (has no administration in time range)  lactated ringers bolus 1,000 mL (1,000 mLs Intravenous New Bag/Given 05/11/21 1826)    ED Course  I have reviewed the triage vital signs and the nursing notes.  Pertinent labs & imaging results that were available during my care of the patient were reviewed by me and considered in my medical decision making (see chart for details).    MDM Rules/Calculators/A&P                           87 year old female comes in after a witnessed syncope.  DDx includes: Orthostatic hypotension Stroke Vertebral artery dissection/stenosis Dysrhythmia PE Vasovagal/neurocardiogenic syncope Aortic stenosis Valvular  disorder/Cardiomyopathy Anemia  She is not able to provide meaningful history.  I spoke with patient's niece, the POA who informs me that patient was hypotensive and bradycardic when the episode occurred.  There was no seizure-like activity and everything was well until the event started.  No nystagmus on our exam.  Cardiovascular exam is also reassuring.  After lengthy discussion with the patient's family Amber, we have agreed to admit her for syncope hours.  CODE STATUS is DNR/DNI.  Final Clinical Impression(s) / ED Diagnoses Final diagnoses:  Syncope and collapse    Rx / DC Orders ED Discharge Orders     None        Derwood Kaplan, MD 05/11/21 2230

## 2021-05-11 NOTE — H&P (Signed)
Kaitlin Smith:[id number] DOB: July 02, 1930 DOA: 05/11/2021   PCP: Alysia Penna, MD   Outpatient Specialists:   CARDS:  Dr. Allyson Sabal    Patient arrived to ER on 05/11/21 at 1626 Referred by Attending Derwood Kaplan, MD   Patient coming from: home Lives  With family    Chief Complaint:   Chief Complaint  Patient presents with   syncopal episode    HPI: Kaitlin Smith is a 87 y.o. female with medical history significant of CAD, diabetes mellitus type 2, hyperlipidemia, dementia, PEs , CVA Chronic combined CHF history of COVID in 2021 hypertension peripheral vascular disease coronary arterial disease, dementia  Presented with syncope Pt have had low BP since April stopped her Coreg Her BP was up this AM so she got some losartan and her BP dropped to 55/41 and HR was in 40's this lasted for 15 min and associated with feeling lightheaded  History of PE in April 2022   On xarelto      Initial COVID TEST  in house  PCR testing  Pending  Lab Results  Component Value Date   SARSCOV2NAA NEGATIVE 01/01/2021   SARSCOV2NAA POSITIVE (A) 08/31/2020     Regarding pertinent Chronic problems:       HTN on Coreg, cozaar   chronic CHF diastolic/systolic/ combined - last echo April 2022 Grade 1 diastolic CHF but preserved EF at that time    CAD  - On  betablocker,                  -  followed by cardiology          DM 2 -  Lab Results  Component Value Date   HGBA1C 5.1 01/02/2021   diet controlled        Hx of CVA -  with/out residual deficits      Hx of DVT/PE on - anticoagulation with Xarelto,     While in ER: Now stable vitals Pt unable to provide hx  UA positive for UTI    ED Triage Vitals  Enc Vitals Group     BP 05/11/21 1650 (!) 158/67     Pulse Rate 05/11/21 1645 72     Resp 05/11/21 1645 (!) 24     Temp 05/11/21 1650 98.3 F (36.8 C)     Temp Source 05/11/21 1650 Oral     SpO2 05/11/21 1645 100 %     Weight 05/11/21 1654 147 lb 0.8 oz (66.7 kg)      Height 05/11/21 1654 5\' 5"  (1.651 m)     Head Circumference --      Peak Flow --      Pain Score 05/11/21 1654 0     Pain Loc --      Pain Edu? --      Excl. in GC? --   TMAX(24)@     _________________________________________ Significant initial  Findings: Abnormal Labs Reviewed  COMPREHENSIVE METABOLIC PANEL - Abnormal; Notable for the following components:      Result Value   BUN 7 (*)    Creatinine, Ser 0.43 (*)    Total Protein 6.1 (*)    Albumin 3.3 (*)    All other components within normal limits  PROTIME-INR - Abnormal; Notable for the following components:   Prothrombin Time 21.3 (*)    INR 1.9 (*)    All other components within normal limits   ____________________________________________     CXR -  NON acute  CTabd/pelvis -  Ordered   KUB - showing possible ileus _________________________ Troponin 10  ECG: Ordered Personally reviewed by me showing: HR : 64 Rhythm:  NSR,     no evidence of ischemic changes QTC 420   The recent clinical data is shown below. Vitals:   05/11/21 1930 05/11/21 2000 05/11/21 2030 05/11/21 2100  BP: (!) 181/83 (!) 193/101 (!) 199/94 (!) 191/107  Pulse: 82 84  84  Resp: 16 20 14  (!) 21  Temp:      TempSrc:      SpO2: 100% 100% 100% 100%  Weight:      Height:        WBC     Component Value Date/Time   WBC 6.9 05/11/2021 1905   LYMPHSABS 0.9 05/11/2021 1905   MONOABS 0.4 05/11/2021 1905   EOSABS 0.1 05/11/2021 1905   BASOSABS 0.0 05/11/2021 1905     Lactic Acid, Venous    Component Value Date/Time   LATICACIDVEN 1.0 05/11/2021 2257      Procalcitonin <,0.1   UA  evidence of UTI      Urine analysis:    Component Value Date/Time   COLORURINE YELLOW 05/11/2021 2205   APPEARANCEUR CLOUDY (A) 05/11/2021 2205   LABSPEC 1.006 05/11/2021 2205   PHURINE 7.0 05/11/2021 2205   GLUCOSEU NEGATIVE 05/11/2021 2205   HGBUR MODERATE (A) 05/11/2021 2205   BILIRUBINUR NEGATIVE 05/11/2021 2205   BILIRUBINUR neg 07/18/2014  1320   KETONESUR NEGATIVE 05/11/2021 2205   PROTEINUR NEGATIVE 05/11/2021 2205   UROBILINOGEN 0.2 07/18/2014 1320   UROBILINOGEN 0.2 06/14/2014 1236   NITRITE NEGATIVE 05/11/2021 2205   LEUKOCYTESUR LARGE (A) 05/11/2021 2205    Results for orders placed or performed during the hospital encounter of 01/01/21  Urine culture     Status: Abnormal   Collection Time: 01/01/21 10:50 PM   Specimen: Urine, Random  Result Value Ref Range Status   Specimen Description   Final    URINE, RANDOM Performed at [facility], [address] [place], [place] [address]    Special Requests   Final    NONE Performed at [facility], [address] [place], [place] [address]    Culture (A)  Final    >=100,000 COLONIES/mL MULTIPLE SPECIES PRESENT, SUGGEST RECOLLECTION   Report Status 01/03/2021 FINAL  Final  Resp Panel by RT-PCR (Flu A&B, Covid) Nasopharyngeal Swab     Status: None   Collection Time: 01/01/21 11:34 PM   Specimen: Nasopharyngeal Swab; Nasopharyngeal(NP) swabs in vial transport medium  Result Value Ref Range Status   SARS Coronavirus 2 by RT PCR NEGATIVE NEGATIVE Final    Comment: (NOTE) SARS-CoV-2 target nucleic acids are NOT DETECTED.  The SARS-CoV-2 RNA is generally detectable in upper respiratory specimens during the acute phase of infection. The lowest concentration of SARS-CoV-2 viral copies this assay can detect is 138 copies/mL. A negative result does not preclude SARS-Cov-2 infection and should not be used as the sole basis for treatment or other patient management decisions. A negative result may occur with  improper specimen collection/handling, submission of specimen other than nasopharyngeal swab, presence of viral mutation(s) within the areas targeted by this assay, and inadequate number of viral copies(<138 copies/mL). A negative result must be combined with clinical observations, patient history, and  epidemiological information. The expected result is Negative.  Fact Sheet for Patients:  BloggerCourse.comhttps://www.fda.gov/media/152166/download  Fact Sheet for Healthcare Providers:  SeriousBroker.ithttps://www.fda.gov/media/152162/download  This test is no t yet approved or cleared by  the [employer] and  has been authorized for detection and/or diagnosis of SARS-CoV-2 by FDA under an Emergency Use Authorization (EUA). This EUA will remain  in effect (meaning this test can be used) for the duration of the COVID-19 declaration under Section 564(b)(1) of the Act, 21 U.S.C.section 360bbb-3(b)(1), unless the authorization is terminated  or revoked sooner.       Influenza A by PCR NEGATIVE NEGATIVE Final   Influenza B by PCR NEGATIVE NEGATIVE Final    Comment: (NOTE) The Xpert Xpress SARS-CoV-2/FLU/RSV plus assay is intended as an aid in the diagnosis of influenza from Nasopharyngeal swab specimens and should not be used as a sole basis for treatment. Nasal washings and aspirates are unacceptable for Xpert Xpress SARS-CoV-2/FLU/RSV testing.  Fact Sheet for Patients: BloggerCourse.com  Fact Sheet for Healthcare Providers: SeriousBroker.it  This test is not yet approved or cleared by the [place] FDA and has been authorized for detection and/or diagnosis of SARS-CoV-2 by FDA under an Emergency Use Authorization (EUA). This EUA will remain in effect (meaning this test can be used) for the duration of the COVID-19 declaration under Section 564(b)(1) of the Act, 21 U.S.C. section 360bbb-3(b)(1), unless the authorization is terminated or revoked.  Performed at [facility], [address] [place], [place] [address]       _______________________________________________ Hospitalist was called for admission for Syncope ad UTI  The following Work up has been ordered so far:  Orders Placed This Encounter  Procedures   CBC WITH  DIFFERENTIAL   Comprehensive metabolic panel   Urinalysis, Routine w reflex microscopic   Protime-INR   APTT   Cardiac monitoring   Interrogate Pacemaker if present   Initiate Carrier Fluid Protocol   Consult to hospitalist   CBG monitoring, ED   ED EKG   EKG 12-Lead     Following Medications were ordered in ER: Medications  lactated ringers bolus 1,000 mL (1,000 mLs Intravenous New Bag/Given 05/11/21 1826)        Consult Orders  (From admission, onward)           Start     Ordered   05/11/21 2139  Consult to hospitalist  Once       Provider:  (Not yet assigned)  Question Answer Comment  Place call to: [facility]   Reason for Consult Admit      05/11/21 2138            OTHER Significant initial  Findings:  labs showing:    Recent Labs  Lab 05/11/21 1905  NA 141  K 5.1  CO2 29  GLUCOSE 98  BUN 7*  CREATININE 0.43*  CALCIUM 9.3    Cr   stable,    Lab Results  Component Value Date   CREATININE 0.43 (L) 05/11/2021   CREATININE 0.73 01/03/2021   CREATININE 0.54 01/01/2021    Recent Labs  Lab 05/11/21 1905  AST 16  ALT 12  ALKPHOS 67  BILITOT 0.7  PROT 6.1*  ALBUMIN 3.3*   Lab Results  Component Value Date   CALCIUM 9.3 05/11/2021   PHOS 2.1 (L) 09/12/2020    Plt: Lab Results  Component Value Date   PLT 216 05/11/2021       Recent Labs  Lab 05/11/21 1905  WBC 6.9  NEUTROABS 5.5  HGB 13.3  HCT 41.1  MCV 98.8  PLT 216    HG/HCT   stable,      Component Value Date/Time  HGB 13.3 05/11/2021 1905   HCT 41.1 05/11/2021 1905   MCV 98.8 05/11/2021 1905        DM  labs:  HbA1C: Recent Labs    09/01/20 0610 01/02/21 0548  HGBA1C 4.8 5.1       CBG (last 3)  Recent Labs    05/11/21 1648  GLUCAP 82        Cultures:    Component Value Date/Time   SDES  01/01/2021 2250    URINE, RANDOM Performed at [facility], [address], [place], [place] [address]    SPECREQUEST  01/01/2021  2250    NONE Performed at [facility], [address], [place], [place] [address]    CULT (A) 01/01/2021 2250    >=100,000 COLONIES/mL MULTIPLE SPECIES PRESENT, SUGGEST RECOLLECTION   REPTSTATUS 01/03/2021 FINAL 01/01/2021 2250     Radiological Exams on Admission: No results found. _______________________________________________________________________________________________________ Latest  Blood pressure (!) 191/107, pulse 84, temperature 98.3 F (36.8 C), temperature source Oral, resp. rate (!) 21, height  (1.651 m), weight 66.7 kg, SpO2 100 %.   Review of Systems:    Pertinent positives include:  fatigue, syncope  Constitutional:  No weight loss, night sweats, Fevers, chills, weight loss  HEENT:  No headaches, Difficulty swallowing,Tooth/dental problems,Sore throat,  No sneezing, itching, ear ache, nasal congestion, post nasal drip,  Cardio-vascular:  No chest pain, Orthopnea, PND, anasarca, dizziness, palpitations.no Bilateral lower extremity swelling  GI:  No heartburn, indigestion, abdominal pain, nausea, vomiting, diarrhea, change in bowel habits, loss of appetite, melena, blood in stool, hematemesis Resp:  no shortness of breath at rest. No dyspnea on exertion, No excess mucus, no productive cough, No non-productive cough, No coughing up of blood.No change in color of mucus.No wheezing. Skin:  no rash or lesions. No jaundice GU:  no dysuria, change in color of urine, no urgency or frequency. No straining to urinate.  No flank pain.  Musculoskeletal:  No joint pain or no joint swelling. No decreased range of motion. No back pain.  Psych:  No change in mood or affect. No depression or anxiety. No memory loss.  Neuro: no localizing neurological complaints, no tingling, no weakness, no double vision, no gait abnormality, no slurred speech, no confusion  All systems reviewed and apart from HOPI all are  negative _______________________________________________________________________________________________ Past Medical History:   Past Medical History:  Diagnosis Date   Allergy    Arthritis    Blindness    Coronary artery disease    Diabetes mellitus without complication (HCC)    Glaucoma    Hyperlipidemia    Hypertension    Myocardial infarction (HCC)    Peripheral vascular disease (HCC)    Stroke [facility])       Past Surgical History:  Procedure Laterality Date   EYE SURGERY      Social History:  Ambulatory  walker      reports that she quit smoking about 18 years ago. Her smoking use included cigarettes. She has never used smokeless tobacco. She reports that she does not drink alcohol and does not use drugs.     Family History:   Family History  Problem Relation Age of Onset   Diabetes Mother    Stroke Mother    Peripheral vascular disease Mother        amputation   Cancer Brother    Diabetes Brother    Alcohol abuse Brother    Varicose Veins Brother  Hypertension Father    Varicose Veins Father    Heart attack Father    Cancer Father    Diabetes Sister    Hypertension Sister    ______________________________________________________________________________________________ Allergies: Allergies  Allergen Reactions   Clonidine Derivatives Other (See Comments)    Pass out; makes her blood pressure too low   Milk-Related Compounds Other (See Comments)    Lactose intolerant.       Prior to Admission medications   Medication Sig Start Date End Date Taking? Authorizing Provider  acetaminophen (TYLENOL) 325 MG tablet Take 2 tablets (650 mg total) by mouth every 6 (six) hours as needed for mild pain (temp > 101.5). 08/22/18   Elgergawy, Leana Roe, MD  carvedilol (COREG) 12.5 MG tablet Take 1 tablet (12.5 mg total) by mouth 2 (two) times daily with a meal. 09/13/20   Azucena Fallen, MD  losartan (COZAAR) 50 MG tablet Take 1 tablet (50 mg total) by mouth  daily. 09/14/20   Azucena Fallen, MD  LUMIGAN 0.01 % SOLN Place 1 drop into both eyes at bedtime. 09/10/19   [provider]  pantoprazole (PROTONIX) 40 MG tablet TAKE 1 TABLET BY MOUTH EVERY DAY 11/15/19   Runell Gess, MD  RIVAROXABAN Carlena Hurl) VTE STARTER PACK (15 & 20 MG) Follow package directions: Take one 15mg  tablet by mouth twice a day. On day 22, switch to one 20mg  tablet once a day. Take with food. 01/05/21   , MD  SIMBRINZA 1-0.2 % SUSP Place 1 drop into both eyes daily. 02/13/20   [provider]  tamsulosin (FLOMAX) 0.4 MG CAPS capsule Take 0.4 mg by mouth daily.    [provider]  VOLTAREN 1 % GEL APPLY 4 GRAMS TOPICALLY 2 TIMES DAILY AS NEEDED. 04/25/16   02/15/20, MD    ___________________________________________________________________________________________________ Physical Exam: Vitals with BMI 05/11/2021 05/11/2021 05/11/2021  Height - - -  Weight - - -  BMI - - -  Systolic 191 199 05/13/2021  Diastolic 107 94 101  Pulse 84 - 84     1. General:  in No  Acute distress   Chronically ill appearing 2. Psychological: Alert and  Oriented 3. Head/ENT:   Dry Mucous Membranes                          Head Non traumatic, neck supple                         Poor Dentition 4. SKIN:  decreased Skin turgor,  Skin clean Dry and intact no rash 5. Heart: Regular rate and rhythm no  Murmur, no Rub or gallop 6. Lungs:  , no wheezes or crackles   7. Abdomen: Soft,  non-tender,  distended   bowel sounds diminished 8. Lower extremities: no clubbing, cyanosis, no  edema 9. Neurologically Grossly intact, moving all 4 extremities equally  intact 10. MSK: Normal range of motion    Chart has been reviewed  ______________________________________________________________________________________________  Assessment/Plan 87 y.o. female with medical history significant of CAD, diabetes mellitus type 2, hyperlipidemia, dementia, PEs ,  CVA Chronic combined CHF history of COVID in 2021 hypertension peripheral vascular disease coronary arterial disease, dementia   Admitted for   transient hypotension and bradycardia and syncope  Present on Admission:    Syncope in the setting of transient hypotension and bradycardia unclear etiology patient not endorsing chest pain shortness of breath  at this time.  Currently vital signs stable.  Cardiology.  Obtain troponin EKG within normal limits obtain echogram and carotid Dopplers Monitor on telemetry Hold Coreg and Cozaar  Ileus -KUB worrisome for ileus.  Patient denies any abdominal pain but does have significant dementia.  Abdomen does appear to have diminished bowel sounds.  Will obtain CT as patient is unable to provide much of any history.  Keep n.p.o. for now until CT is resulted. If nonsurgical could try to address advance diet slowly Supportive measures   Hyperlipidemia not on statin   Essential hypertension - hold home meds    Acute lower UTI - - treat with Rocephin        await results of urine culture and adjust antibiotic coverage as needed   Chronic combined systolic (congestive) and diastolic (congestive) heart failure (HCC) gently rehydrate  Hx of PE - continue xarelto  Dm2 -  - Order Sensitive * SSI     -  check TSH and HgA1C   Dementia chronic- monitor for signs of sun downing   Other plan as per orders.  DVT prophylaxis:  xarelto     Code Status:   DNR/DNI     DNR order at the bedside     Family Communication:   Family not at  Bedside  Tried  to call Niece but no answer  Disposition Plan:       To home once workup is complete and patient is stable   Following barriers for discharge:                            Electrolytes corrected                                                            able to transition to PO antibiotics                             Will need to be able to tolerate PO                                                         Will need consultants to evaluate patient prior to discharge                       Would benefit from PT/OT eval prior to DC  Ordered                    Palliative care    consulted               Consults called: emailed cardiology  Admission status:  ED Disposition     ED Disposition  Admit   Condition  --   Comment  Hospital Area: [facility] [[id number]]  Level of Care: Telemetry [5]  Admit to tele based on following criteria: Other see comments  Comments: syncope  May place patient in observation at [facility] or [facility] if equivalent level of care is available:: No  Covid Evaluation: Asymptomatic Screening Protocol (No Symptoms)  Diagnosis: Syncope [206001]  Admitting Physician: Therisa Doyne [[id number]]  Attending Physician: Therisa Doyne [[id number]]          Obs    Level of care    tele  For   24H       Precautions: admitted as  asymptomatic screening protocol    PPE: Used by the provider:   N95  eye Goggles,  Gloves    Cheyanne Lamison 05/12/2021, 1:11 AM    [facility]     after 2 AM please page floor coverage PA If 7AM-7PM, please contact the day team taking care of the patient using [email or web address]   Patient was evaluated in the context of the global COVID-19 pandemic, which necessitated consideration that the patient might be at risk for infection with the SARS-CoV-2 virus that causes COVID-19. Institutional protocols and algorithms that pertain to the evaluation of patients at risk for COVID-19 are in a state of rapid change based on information released by regulatory bodies including the CDC and federal and state organizations. These policies and algorithms were followed during the patient's care.

## 2021-05-12 ENCOUNTER — Other Ambulatory Visit (HOSPITAL_COMMUNITY): Payer: Federal, State, Local not specified - PPO

## 2021-05-12 ENCOUNTER — Observation Stay (HOSPITAL_COMMUNITY): Payer: Medicare Other

## 2021-05-12 ENCOUNTER — Encounter (HOSPITAL_COMMUNITY): Payer: Self-pay | Admitting: Internal Medicine

## 2021-05-12 ENCOUNTER — Observation Stay (HOSPITAL_COMMUNITY): Payer: Federal, State, Local not specified - PPO

## 2021-05-12 ENCOUNTER — Ambulatory Visit (HOSPITAL_COMMUNITY): Payer: Medicare Other

## 2021-05-12 DIAGNOSIS — N390 Urinary tract infection, site not specified: Secondary | ICD-10-CM | POA: Diagnosis not present

## 2021-05-12 DIAGNOSIS — E7800 Pure hypercholesterolemia, unspecified: Secondary | ICD-10-CM

## 2021-05-12 DIAGNOSIS — I10 Essential (primary) hypertension: Secondary | ICD-10-CM | POA: Diagnosis not present

## 2021-05-12 DIAGNOSIS — I5042 Chronic combined systolic (congestive) and diastolic (congestive) heart failure: Secondary | ICD-10-CM | POA: Diagnosis not present

## 2021-05-12 DIAGNOSIS — R55 Syncope and collapse: Secondary | ICD-10-CM | POA: Diagnosis not present

## 2021-05-12 DIAGNOSIS — E860 Dehydration: Secondary | ICD-10-CM | POA: Diagnosis not present

## 2021-05-12 DIAGNOSIS — I420 Dilated cardiomyopathy: Secondary | ICD-10-CM

## 2021-05-12 DIAGNOSIS — K567 Ileus, unspecified: Secondary | ICD-10-CM | POA: Diagnosis present

## 2021-05-12 LAB — CBC WITH DIFFERENTIAL/PLATELET
Abs Immature Granulocytes: 0.02 10*3/uL (ref 0.00–0.07)
Basophils Absolute: 0 10*3/uL (ref 0.0–0.1)
Basophils Relative: 0 %
Eosinophils Absolute: 0.1 10*3/uL (ref 0.0–0.5)
Eosinophils Relative: 3 %
HCT: 41.6 % (ref 36.0–46.0)
Hemoglobin: 13.8 g/dL (ref 12.0–15.0)
Immature Granulocytes: 0 %
Lymphocytes Relative: 25 %
Lymphs Abs: 1.3 10*3/uL (ref 0.7–4.0)
MCH: 32.5 pg (ref 26.0–34.0)
MCHC: 33.2 g/dL (ref 30.0–36.0)
MCV: 98.1 fL (ref 80.0–100.0)
Monocytes Absolute: 0.5 10*3/uL (ref 0.1–1.0)
Monocytes Relative: 10 %
Neutro Abs: 3.3 10*3/uL (ref 1.7–7.7)
Neutrophils Relative %: 62 %
Platelets: 252 10*3/uL (ref 150–400)
RBC: 4.24 MIL/uL (ref 3.87–5.11)
RDW: 12.2 % (ref 11.5–15.5)
WBC: 5.3 10*3/uL (ref 4.0–10.5)
nRBC: 0 % (ref 0.0–0.2)

## 2021-05-12 LAB — COMPREHENSIVE METABOLIC PANEL
ALT: 11 U/L (ref 0–44)
AST: 14 U/L — ABNORMAL LOW (ref 15–41)
Albumin: 3.2 g/dL — ABNORMAL LOW (ref 3.5–5.0)
Alkaline Phosphatase: 73 U/L (ref 38–126)
Anion gap: 6 (ref 5–15)
BUN: 8 mg/dL (ref 8–23)
CO2: 28 mmol/L (ref 22–32)
Calcium: 9 mg/dL (ref 8.9–10.3)
Chloride: 105 mmol/L (ref 98–111)
Creatinine, Ser: 0.65 mg/dL (ref 0.44–1.00)
GFR, Estimated: >60 mL/min (ref >60)
Glucose, Bld: 99 mg/dL (ref 70–99)
Potassium: 4.7 mmol/L (ref 3.5–5.1)
Sodium: 139 mmol/L (ref 135–145)
Total Bilirubin: 0.6 mg/dL (ref 0.3–1.2)
Total Protein: 6 g/dL — ABNORMAL LOW (ref 6.5–8.1)

## 2021-05-12 LAB — GLUCOSE, CAPILLARY
Glucose-Capillary: 86 mg/dL (ref 70–99)
Glucose-Capillary: 112 mg/dL — ABNORMAL HIGH (ref 70–99)

## 2021-05-12 LAB — TSH: TSH: 0.688 u[IU]/mL (ref 0.350–4.500)

## 2021-05-12 LAB — CBG MONITORING, ED
Glucose-Capillary: 93 mg/dL (ref 70–99)
Glucose-Capillary: 87 mg/dL (ref 70–99)

## 2021-05-12 LAB — SARS CORONAVIRUS 2 (TAT 6-24 HRS): SARS Coronavirus 2: NEGATIVE

## 2021-05-12 LAB — HEMOGLOBIN A1C
Hgb A1c MFr Bld: 4.5 % — ABNORMAL LOW (ref 4.8–5.6)
Mean Plasma Glucose: 82.45 mg/dL

## 2021-05-12 LAB — MAGNESIUM: Magnesium: 1.9 mg/dL (ref 1.7–2.4)

## 2021-05-12 LAB — PHOSPHORUS: Phosphorus: 3.4 mg/dL (ref 2.5–4.6)

## 2021-05-12 MED ORDER — IOHEXOL 350 MG/ML SOLN
100.0000 mL | Freq: Once | INTRAVENOUS | Status: AC | PRN
  Administered 2021-05-12: 80 mL via INTRAVENOUS

## 2021-05-12 MED ORDER — HALOPERIDOL LACTATE 5 MG/ML IJ SOLN
2.0000 mg | Freq: Four times a day (QID) | INTRAMUSCULAR | Status: DC | PRN
  Administered 2021-05-12: 2 mg via INTRAVENOUS
  Filled 2021-05-12: qty 1

## 2021-05-12 NOTE — Progress Notes (Signed)
Patient refused carotid artery duplex. While explaining the test, the patient became verbally aggressive, yelling profanities, and spitting. The patient's nurse, I'li, was informed.  05/12/21 10:03 AM Olen Cordial RVT

## 2021-05-12 NOTE — ED Notes (Addendum)
Pt refused to have CBG taken.

## 2021-05-12 NOTE — Progress Notes (Signed)
OT Cancellation Note  Patient Details Name: Cyleigh Massaro MRN: [id number] DOB: 05-15-1930   Cancelled Treatment:    Reason Eval/Treat Not Completed: Patient declined, no reason specified Patient stating she is not at [facility] long and to "get the hell out or I'll call the police." Is confused, multiple attempts made to redirect however stating "NO" to any attempt at mobility. May be more agreeable if family present? Will re-attempt 8/25 as able.   Marlyce Huge OT OT pager: [phone]   Carmelia Roller 05/12/2021, 9:50 AM

## 2021-05-12 NOTE — ED Notes (Signed)
Patient transported to CT 

## 2021-05-12 NOTE — Progress Notes (Signed)
PT Cancellation Note  Patient Details Name: Kaitlin Smith MRN: [id number] DOB: April 04, 1930   Cancelled Treatment:    Reason Eval/Treat Not Completed: Patient declined, no reason specified Patient adamantly refused, told us to leave and that she was not at [facility]. Unable to participate. Maybe if niece is present, patient will cooperate.  Rada Hay 05/12/2021, 8:36 AM Blanchard Kelch PT Acute Rehabilitation Services Pager [phone] Office [phone]

## 2021-05-12 NOTE — ED Notes (Signed)
Pt continues to refuse medication. States 'Deb will come give it to me. I want to wait for her'

## 2021-05-12 NOTE — Progress Notes (Addendum)
PROGRESS NOTE    Maeci Kalbfleisch  HUD:[id number] DOB: 1930-07-20 DOA: 05/11/2021 PCP: Alysia Penna, MD    Brief Narrative:  87 y.o. female with medical history significant of CAD, diabetes mellitus type 2, hyperlipidemia, dementia, PEs , CVA. Chronic combined CHF history of COVID in 2021 hypertension peripheral vascular disease coronary arterial disease, dementia. Pt presented with syncope with labile blood pressures on presentation  Assessment & Plan:   Active Problems:   DM2 (diabetes mellitus, type 2) (HCC)   History of CVA (cerebrovascular accident)   Essential hypertension   Hyperlipidemia   Acute lower UTI   Dehydration   Syncope   Chronic combined systolic (congestive) and diastolic (congestive) heart failure (HCC)   Ileus (HCC)   Syncope  -Noted to be in the setting of transient hypotension and bradycardia unclear etiology patient not endorsing chest pain shortness of breath at this time.   -Cardiology is following.  Recommendations for updated 2D echocardiogram, carotid Dopplers, chest CTA to rule out PE as well as to assess aorta -Coreg and Cozaar on hold at time of presentation   Ileus - -KUB worrisome for ileus. -Follow-up CT abdomen personally reviewed.  Findings notable for retained fecal material and constipation -Recommend more aggressive bowel regimen    Hyperlipidemia  -Noted to be not on statin    Essential hypertension -  -hold home meds as per above    Acute lower UTI - -  -Continued with Rocephin  -Urine culture pending    Chronic combined systolic (congestive) and diastolic (congestive) heart failure (HCC) gently rehydrate   Hx of PE - continue xarelto   Dm2 -continue sliding scale insulin     Dementia chronic- monitor for signs of sun downing -Mildly confused and agitated this morning  Left renal pelvis lesion -Personally reviewed on abdominal CT.  8 mm enhancing lesion of the left renal pelvis concerning for urothelial neoplasm -Discussed  with urology.  Recommendation to focus on treating constipation and distended bladder with plan for follow-up following resolution of above to reassess as outpatient   DVT prophylaxis: Xarelto Code Status: DNR Family Communication: Pt in room, family not at bedside  Status is: Observation  The patient remains OBS appropriate and will d/c before 2 midnights.  Dispo: The patient is from: Home              Anticipated d/c is to: Home              Patient currently is not medically stable to d/c.   Difficult to place patient No       Consultants:  Cardiology  Procedures:    Antimicrobials: Anti-infectives (From admission, onward)    Start     Dose/Rate Route Frequency Ordered Stop   05/11/21 2230  cefTRIAXone (ROCEPHIN) 2 g in sodium chloride 0.9 % 100 mL IVPB        2 g 200 mL/hr over 30 Minutes Intravenous Every 24 hours 05/11/21 2224 05/18/21 2229       Subjective: Agitated this AM, refused exam  Objective: Vitals:   05/12/21 0600 05/12/21 0900 05/12/21 0950 05/12/21 1300  BP: (!) 190/97 (!) 158/92 (!) 159/91 135/85  Pulse: 85  90 92  Resp: 14 16 16  (!) 21  Temp:      TempSrc:      SpO2: 99%  100% 100%  Weight:      Height:        Intake/Output Summary (Last 24 hours) at 05/12/2021 1500 Last data filed at  05/11/2021 2335 Gross per 24 hour  Intake 1099.83 ml  Output --  Net 1099.83 ml   Filed Weights   05/11/21 1654  Weight: 66.7 kg    Examination: General exam: Awake, laying in bed, in nad Respiratory system: Normal respiratory effort, no audible wheezing Cardiovascular system: perfused, refused remaineder of exam Gastrointestinal system: unable to palpate since pt refused physical exam Central nervous system: CN2-12 grossly intact, strength intact Extremities: Perfused, no clubbing Skin: Normal skin turgor, no notable skin lesions seen Psychiatry: agitated   Data Reviewed: I have personally reviewed following labs and imaging  studies  CBC: Recent Labs  Lab 05/11/21 1905 05/12/21 0420  WBC 6.9 5.3  NEUTROABS 5.5 3.3  HGB 13.3 13.8  HCT 41.1 41.6  MCV 98.8 98.1  PLT 216 252   Basic Metabolic Panel: Recent Labs  Lab 05/11/21 1905 05/12/21 0420  NA 141 139  K 5.1 4.7  CL 106 105  CO2 29 28  GLUCOSE 98 99  BUN 7* 8  CREATININE 0.43* 0.65  CALCIUM 9.3 9.0  MG  --  1.9  PHOS  --  3.4   GFR: Estimated Creatinine Clearance: 42.9 mL/min (by C-G formula based on SCr of 0.65 mg/dL). Liver Function Tests: Recent Labs  Lab 05/11/21 1905 05/12/21 0420  AST 16 14*  ALT 12 11  ALKPHOS 67 73  BILITOT 0.7 0.6  PROT 6.1* 6.0*  ALBUMIN 3.3* 3.2*   No results for input(s): LIPASE, AMYLASE in the last 168 hours. No results for input(s): AMMONIA in the last 168 hours. Coagulation Profile: Recent Labs  Lab 05/11/21 1905  INR 1.9*   Cardiac Enzymes: No results for input(s): CKTOTAL, CKMB, CKMBINDEX, TROPONINI in the last 168 hours. BNP (last 3 results) No results for input(s): PROBNP in the last 8760 hours. HbA1C: Recent Labs    05/11/21 2258  HGBA1C 4.5*   CBG: Recent Labs  Lab 05/11/21 1648 05/11/21 2340 05/12/21 0419 05/12/21 1207  GLUCAP 82 119* 93 87   Lipid Profile: No results for input(s): CHOL, HDL, LDLCALC, TRIG, CHOLHDL, LDLDIRECT in the last 72 hours. Thyroid Function Tests: Recent Labs    05/12/21 0420  TSH 0.688   Anemia Panel: No results for input(s): VITAMINB12, FOLATE, FERRITIN, TIBC, IRON, RETICCTPCT in the last 72 hours. Sepsis Labs: Recent Labs  Lab 05/11/21 2224 05/11/21 2257  PROCALCITON <0.10  --   LATICACIDVEN  --  1.0    Recent Results (from the past 240 hour(s))  SARS CORONAVIRUS 2 (TAT 6-24 HRS) Nasopharyngeal Nasopharyngeal Swab     Status: None   Collection Time: 05/11/21 10:57 PM   Specimen: Nasopharyngeal Swab  Result Value Ref Range Status   SARS Coronavirus 2 NEGATIVE NEGATIVE Final    Comment: (NOTE) SARS-CoV-2 target nucleic acids are  NOT DETECTED.  The SARS-CoV-2 RNA is generally detectable in upper and lower respiratory specimens during the acute phase of infection. Negative results do not preclude SARS-CoV-2 infection, do not rule out co-infections with other pathogens, and should not be used as the sole basis for treatment or other patient management decisions. Negative results must be combined with clinical observations, patient history, and epidemiological information. The expected result is Negative.  Fact Sheet for Patients: HairSlick.no  Fact Sheet for Healthcare Providers: quierodirigir.com  This test is not yet approved or cleared by the [place] FDA and  has been authorized for detection and/or diagnosis of SARS-CoV-2 by FDA under an Emergency Use Authorization (EUA). This EUA will remain  in effect (meaning this test can be used) for the duration of the COVID-19 declaration under Se ction 564(b)(1) of the Act, 21 U.S.C. section 360bbb-3(b)(1), unless the authorization is terminated or revoked sooner.  Performed at [facility], [address] [place], [place] [address]      Radiology Studies: DG Abd 1 View  Result Date: 05/12/2021 CLINICAL DATA:  Abdominal distention EXAM: ABDOMEN - 1 VIEW COMPARISON:  None. FINDINGS: Gas and stool-filled colon with gas-filled small bowel. No small or large bowel distention suggesting likely ileus. No radiopaque stones. Degenerative changes in the spine. Prominent vascular calcifications. IMPRESSION: Gas-filled nondilated small and large bowel likely representing ileus. Electronically Signed   By: Burman Nieves M.D.   On: 05/12/2021 00:37   CT ABDOMEN PELVIS W CONTRAST  Result Date: 05/12/2021 CLINICAL DATA:  Concern for bowel obstruction. EXAM: CT ABDOMEN AND PELVIS WITH CONTRAST TECHNIQUE: Multidetector CT imaging of the abdomen and pelvis was performed using the standard protocol following  bolus administration of intravenous contrast. CONTRAST:  42mL OMNIPAQUE IOHEXOL 350 MG/ML SOLN COMPARISON:  CT of the chest abdomen pelvis dated 01/01/2021. FINDINGS: Lower chest: Bibasilar atelectasis and bronchiectasis. There is mild cardiomegaly. Coronary vascular calcification. No intra-abdominal free air or free fluid. Hepatobiliary: Indeterminate 12 mm hypodense lesion in the left lobe of the liver. Several additional smaller hypodense lesions in the liver are not characterized. No intrahepatic biliary ductal dilatation. Several stones in the neck of the gallbladder. There is a small pericholecystic fluid. Further evaluation with ultrasound is recommended to evaluate for possibility of acute cholecystitis. Pancreas: Unremarkable. No pancreatic ductal dilatation or surrounding inflammatory changes. Spleen: Normal in size without focal abnormality. Adrenals/Urinary Tract: The adrenal glands are unremarkable. There is mild bilateral hydronephrosis. There is an 8 mm enhancing lesion in the left renal pelvis (64/5) concerning for a urothelial neoplasm. Further evaluation with cystoscopy is recommended. Several discrimination there is a 1 cm right renal inferior pole cyst. The visualized ureters appear unremarkable the urinary bladder is distended. There is diffuse trabeculated appearance of the bladder wall consistent with chronic bladder dysfunction. Correlation with urinalysis recommended to exclude cystitis. Stomach/Bowel: There is a large fecal content within the rectal vault. Moderate stool noted throughout the colon. There is sigmoid diverticulosis without active inflammatory changes. There is no bowel obstruction or active inflammation. The appendix is normal. Vascular/Lymphatic: Advanced aortoiliac atherosclerotic disease. IVC is unremarkable. No pain venous gas. There is no adenopathy. Reproductive: Several calcified uterine fibroids noted. Somewhat thickened appearance of the endometrium. This is not  evaluated on CT. Further evaluation with pelvic ultrasound on a nonemergent/outpatient basis recommended. Other: Focal area of heterogeneity of the presacral fat similar to prior CT, nonspecific, but may be related to sequela of chronic inflammation. Musculoskeletal: Osteopenia with degenerative changes of the spine. No acute osseous pathology. IMPRESSION: 1. Cholelithiasis with small pericholecystic fluid. Further evaluation with ultrasound is recommended to evaluate for possibility of acute cholecystitis. 2. An 8 mm enhancing lesion in the left renal pelvis most concerning for a urothelial neoplasm. Direct visualization with scope recommended. 3. Constipation with possible fecal content within the rectal vault. No bowel obstruction. Normal appendix. 4. Sigmoid diverticulosis. 5. Mild bilateral hydronephrosis with findings of chronic bladder dysfunction. Correlation with urinalysis recommended to exclude cystitis. 6. Somewhat thickened appearance of the endometrium. Further evaluation with pelvic ultrasound on a nonemergent/outpatient basis recommended. 7. Aortic Atherosclerosis (ICD10-I70.0). Electronically Signed   By: Elgie Collard M.D.   On: 05/12/2021 01:59   DG CHEST  PORT 1 VIEW  Result Date: 05/11/2021 CLINICAL DATA:  Hypotension EXAM: PORTABLE CHEST 1 VIEW COMPARISON:  09/11/2020 FINDINGS: Heart is normal size. No confluent airspace opacities or effusions. Aortic atherosclerosis. No acute bony abnormality. IMPRESSION: No active disease. Electronically Signed   By: Charlett NoseKevin  Dover M.D.   On: 05/11/2021 23:36    Scheduled Meds:  brinzolamide  1 drop Both Eyes TID   And   brimonidine  1 drop Both Eyes TID   insulin aspart  0-9 Units Subcutaneous Q4H   pantoprazole  40 mg Oral Daily   rivaroxaban  20 mg Oral Daily   tamsulosin  0.4 mg Oral Daily   Continuous Infusions:  cefTRIAXone (ROCEPHIN)  IV Stopped (05/11/21 2335)     LOS: 0 days   Rickey BarbaraStephen Bryley Chrisman, MD [facility] Pager On  Amion  If 7PM-7AM, please contact night-coverage 05/12/2021, 3:00 PM

## 2021-05-12 NOTE — Patient Care Conference (Signed)
Tried to call patient's niece at phone number listed to give update. No answer. Will try again later

## 2021-05-12 NOTE — Progress Notes (Signed)
Pt arrived on unit. Pt is alert and oriented to self. Pt was given soap suds enema. Pt has purewick on peed around and Bladder scanned volume was . Pt had one bowel movement before enema. MD notified. No In and out this time. Pt niece on the bedside. Will continue monitor the pt.

## 2021-05-12 NOTE — Consult Note (Signed)
Cardiology Consultation:   Patient ID: Kaitlin Smith MRN: [id number]; DOB: Oct 29, 1929  Admit date: 05/11/2021 Date of Consult: 05/12/2021  PCP:  Velna Hatchet, MD   [facility] Providers Cardiologist:  Quay Burow, MD   {  Patient Profile:   Kaitlin Smith is a 87 y.o. 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 Donzetta Matters, PAD with high-grade bilateral SFA disease and severe tibial denies noted on dopplers in 2015, bilateral carotid artery disease, prior stroke, bilateral PEs and DVTs on Xarelto, hypertension, hyperlipidemia, type 2 diabetes mellitus, glaucoma, legal blindness, and vascular dementia who is being seen 05/12/2021 for the evaluation of syncope at the request of Dr. Roel Cluck.  History of Present Illness:   [name] is a 88 year old female with the above history who is followed by Dr. Gwenlyn Found. Patient was referred to Dr. Gwenlyn Found in December 2015 for further evaluation of PAD after moving to [facility] from [place]. Lower extremity dopplers prior to this visit showed ABIs of 0.8 bilaterally with high grade bilateral SFA disease and severe tibial vessel disease. TBIs were in the 0.5 range bilaterally as well. However, she denied any claudication so conservative therapy was recommended. At that visit, patient also reported prior stroke in 11/2013 with bilateral carotid artery disease with total occlusion of her right carotid. She denied revascularization at that time. She also reported a history of a "silent MI" at that time but was reportedly never formally evaluated for this. Myoview in February 2016 was low risk with no evidence of ischemia and normal wall motion. Patient was admitted in November/ December 2019 with back pain and found to have an acute distal thoracic aortic dissection. Vascular Surgery was consulted. Patient preferred not to have surgery unless absolutely necessary so patient was managed conservatively. Patient was last  seen by Dr. Gwenlyn Found in October 2020 at which time she was doing well with no chest pain or shortness of breath. Since that visit, she was admitted in December 2021 with acute DVT of right popliteal vein. She was started on anticoagulation. Hospitalization was complicated by flash pulmonary edema secondary to Takotsubo cardiomyopathy. High-sensitivity peaked at 2,788 at that time. Echo showed LVEF of 30-35% with severe hypokinesis of the mid septum and all apical segments. She was was diuresed but no cardiac catheterization was performed. We never saw patient for follow-up after this admission. She was admitted again in 12/2020 for syncope that occurred when she was straining on the commode. She was found to have multiple subsegmental PEs during work up as well as bilateral age indeterminate DVTs. She was restarted on Xarelto. Of note, she is followed by Palliative Care as an outpatient.  Patient presented to the [facility] ED on 05/11/2021 via EMS for further evaluation of syncopal episode in the setting of hypotension with BP reportedly as low as 55/41 at home and heart rate in the 40s. The patient is a VERY POOR HISTORIAN.  She dose not remember anything surrounding the event.  She denies any chest pain, SOB, DOE, PND, orthopnea, LE edema, palpitations, dizziness.  She initially told me that she was talking to someone on the phone and they said that she stopped talking and EMS was called but then after questioning her further she told me that it was 2 weeks ago that she passed out.   In the ED, patient hypertensive with BP as high as 190s/100s at times. EKG showed normal sinus rhythm, rate 64 bpm, with 1st degree  AV block, Q waves in inferior leads, and T  wave inversions in V2-V4. However, T wave changes are not new and actually improved from EKG in 12/2020. High-sensitivity troponin negative. Chest x-ray showed no acute findings. WBC 6.9, Hgb 13.3, Plts 216. Na 141, K 5.1, Glucose 98, BUN, Cr 0.43. Albumin 3.3.  AST 16, ALT 12, Alk Phos 67, Total Bili 0.7. Urinalysis showed moderate hemoglobin, large leukocytes, >50 WBC, and few bacteria. Urine culture pending. Procalcitonin normal. COVID test pending.   Past Medical History:  Diagnosis Date   Allergy    Arthritis    Blindness    Coronary artery disease    Diabetes mellitus without complication (HCC)    Glaucoma    Hyperlipidemia    Hypertension    Myocardial infarction [facility])    Peripheral vascular disease ([place])    Stroke [facility])     Past Surgical History:  Procedure Laterality Date   EYE SURGERY       Home Medications:  Prior to Admission medications   Medication Sig Start Date End Date Taking? Authorizing Provider  acetaminophen (TYLENOL) 325 MG tablet Take 2 tablets (650 mg total) by mouth every 6 (six) hours as needed for mild pain (temp > 101.5). 08/22/18  Yes Elgergawy, Silver Huguenin, MD  carvedilol (COREG) 12.5 MG tablet Take 1 tablet (12.5 mg total) by mouth 2 (two) times daily with a meal. 09/13/20  Yes Little Ishikawa, MD  losartan (COZAAR) 50 MG tablet Take 1 tablet (50 mg total) by mouth daily. 09/14/20  Yes Little Ishikawa, MD  LUMIGAN 0.01 % SOLN Place 1 drop into both eyes at bedtime. 09/10/19  Yes [provider]  pantoprazole (PROTONIX) 40 MG tablet TAKE 1 TABLET BY MOUTH EVERY DAY 11/15/19  Yes Lorretta Harp, MD  RIVAROXABAN [employer]) VTE STARTER PACK (15 & 20 MG) Follow package directions: Take one 40m tablet by mouth twice a day. On day 22, switch to one 224mtablet once a day. Take with food. Patient taking differently: Take 20 mg by mouth daily. Follow package directions: Take one 1533mablet by mouth twice a day. On day 22, switch to one 71m39mblet once a day. Take with food. 01/05/21  Yes Lama, GagaMarge Duncans  SIMBRINZA 1-0.2 % SUSP Place 1 drop into both eyes daily. 02/13/20  Yes [provider]  tamsulosin (FLOMAX) 0.4 MG CAPS capsule Take 0.4 mg by mouth daily.   Yes [provider]   VOLTAREN 1 % GEL APPLY 4 GRAMS TOPICALLY 2 TIMES DAILY AS NEEDED. 04/25/16  Yes BerrLorretta Harp    Inpatient Medications: Scheduled Meds:  brinzolamide  1 drop Both Eyes TID   And   brimonidine  1 drop Both Eyes TID   insulin aspart  0-9 Units Subcutaneous Q4H   pantoprazole  40 mg Oral Daily   rivaroxaban  20 mg Oral Daily   tamsulosin  0.4 mg Oral Daily   Continuous Infusions:  sodium chloride 75 mL/hr (05/12/21 0206)   cefTRIAXone (ROCEPHIN)  IV Stopped (05/11/21 2335)   PRN Meds: acetaminophen **OR** acetaminophen  Allergies:    Allergies  Allergen Reactions   Clonidine Derivatives Other (See Comments)    Pass out; makes her blood pressure too low   Milk-Related Compounds Other (See Comments)    Lactose intolerant.      Social History:   Social History   Socioeconomic History   Marital status: Widowed    Spouse name: Not on file  Number of children: Not on file   Years of education: Not on file   Highest education level: Not on file  Occupational History   Not on file  Tobacco Use   Smoking status: Former    Types: Cigarettes    Quit date: 06/06/2002    Years since quitting: 18.9   Smokeless tobacco: Never   Tobacco comments:    Quit at age 31  Vaping Use   Vaping Use: Never used  Substance and Sexual Activity   Alcohol use: No    Alcohol/week: 0.0 standard drinks   Drug use: No   Sexual activity: Never  Other Topics Concern   Not on file  Social History Narrative   Not on file   Social Determinants of Health   Financial Resource Strain: Not on file  Food Insecurity: Not on file  Transportation Needs: Not on file  Physical Activity: Not on file  Stress: Not on file  Social Connections: Not on file  Intimate Partner Violence: Not on file    Family History:   Family History  Problem Relation Age of Onset   Diabetes Mother    Stroke Mother    Peripheral vascular disease Mother        amputation   Cancer Brother    Diabetes Brother     Alcohol abuse Brother    Varicose Veins Brother    Hypertension Father    Varicose Veins Father    Heart attack Father    Cancer Father    Diabetes Sister    Hypertension Sister      ROS:  Please see the history of present illness.   All other ROS reviewed and negative.     Physical Exam/Data:   Vitals:   05/12/21 0230 05/12/21 0300 05/12/21 0400 05/12/21 0500  BP: (!) 156/86 (!) 152/88 (!) 158/83 (!) 159/81  Pulse: 80 80 80 82  Resp: 20 (!) '22 18 17  ' Temp:      TempSrc:      SpO2: 99% 97% 100% 100%  Weight:      Height:        Intake/Output Summary (Last 24 hours) at 05/12/2021 0631 Last data filed at 05/11/2021 2335 Gross per 24 hour  Intake 1099.83 ml  Output --  Net 1099.83 ml   Last 3 Weights 05/11/2021 01/01/2021 09/13/2020  Weight (lbs) 147 lb 0.8 oz 147 lb 133 lb 6.1 oz  Weight (kg) 66.7 kg 66.679 kg 60.5 kg     Body mass index is 24.47 kg/m.   General: 87 y.o. female resting comfortably in no acute distress. Thin and cachectic appearing HEENT: Normocephalic and atraumatic. Sclera clear. EOMs intact. Neck: Supple. No carotid bruits. No JVD.  Heart: RRR. Distinct S1 and S2. No murmurs, gallops, or rubs. Radial and distal pedal pulses 2+ and equal bilaterally. Lungs: No increased work of breathing. Clear to ausculation bilaterally. No wheezes, rhonchi, or rales.  Abdomen: Soft, non-distended, and non-tender to palpation. Bowel sounds present in all 4 quadrants.  MSK: Normal strength and tone for age. Extremities: No clubbing, cyanosis, or edema.    Skin: Warm and dry. Neuro: Alert and oriented x3. No focal deficits. Psych: Normal affect. Responds appropriately.   EKG:  The EKG was personally reviewed and demonstrates: Normal sinus rhythm, rate 64 bpm, with 1st degree AV block, Q waves in inferior leads, and T  wave inversions in V2-V4. However, T wave changes are not new and actually improved from EKG in 12/2020.  Telemetry:  Telemetry was personally  reviewed and demonstrates:  NSR  Relevant CV Studies:  Myoview 11/06/2014: Impression: Impression Exercise Capacity:  Lexiscan with no exercise. BP Response:  Normal blood pressure response. Clinical Symptoms:  No significant symptoms noted. ECG Impression:  No significant ST segment change suggestive of ischemia. Comparison with Prior Nuclear Study: No previous nuclear study performed   Overall Impression:  Normal stress nuclear study.   LV Wall Motion:  NL LV Function; NL Wall Motion _______________  Echocardiogram 01/03/2021: Impressions:  1. Left ventricular ejection fraction, by estimation, is 65 to 70%. The  left ventricle has normal function. The left ventricle has no regional  wall motion abnormalities. There is moderate left ventricular hypertrophy.  Left ventricular diastolic  parameters are consistent with Grade I diastolic dysfunction (impaired  relaxation).   2. Right ventricular systolic function is normal. The right ventricular  size is normal.   3. The mitral valve is normal in structure. No evidence of mitral valve  regurgitation. No evidence of mitral stenosis.   4. The aortic valve is tricuspid. There is moderate calcification of the  aortic valve. There is moderate thickening of the aortic valve. Aortic  valve regurgitation is not visualized. Mild to moderate aortic valve  sclerosis/calcification is present,  without any evidence of aortic stenosis.  _______________  Laboratory Data:  High Sensitivity Troponin:   Recent Labs  Lab 05/11/21 2224  TROPONINIHS 10     Chemistry Recent Labs  Lab 05/11/21 1905 05/12/21 0420  NA 141 139  K 5.1 4.7  CL 106 105  CO2 29 28  GLUCOSE 98 99  BUN 7* 8  CREATININE 0.43* 0.65  CALCIUM 9.3 9.0  GFRNONAA >60 >60  ANIONGAP 6 6    Recent Labs  Lab 05/11/21 1905 05/12/21 0420  PROT 6.1* 6.0*  ALBUMIN 3.3* 3.2*  AST 16 14*  ALT 12 11  ALKPHOS 67 73  BILITOT 0.7 0.6   Hematology Recent Labs  Lab  05/11/21 1905 05/12/21 0420  WBC 6.9 5.3  RBC 4.16 4.24  HGB 13.3 13.8  HCT 41.1 41.6  MCV 98.8 98.1  MCH 32.0 32.5  MCHC 32.4 33.2  RDW 12.3 12.2  PLT 216 252   BNPNo results for input(s): BNP, PROBNP in the last 168 hours.  DDimer No results for input(s): DDIMER in the last 168 hours.   Radiology/Studies:  DG Abd 1 View  Result Date: 05/12/2021 CLINICAL DATA:  Abdominal distention EXAM: ABDOMEN - 1 VIEW COMPARISON:  None. FINDINGS: Gas and stool-filled colon with gas-filled small bowel. No small or large bowel distention suggesting likely ileus. No radiopaque stones. Degenerative changes in the spine. Prominent vascular calcifications. IMPRESSION: Gas-filled nondilated small and large bowel likely representing ileus. Electronically Signed   By: Lucienne Capers M.D.   On: 05/12/2021 00:37   CT ABDOMEN PELVIS W CONTRAST  Result Date: 05/12/2021 CLINICAL DATA:  Concern for bowel obstruction. EXAM: CT ABDOMEN AND PELVIS WITH CONTRAST TECHNIQUE: Multidetector CT imaging of the abdomen and pelvis was performed using the standard protocol following bolus administration of intravenous contrast. CONTRAST:  66m OMNIPAQUE IOHEXOL 350 MG/ML SOLN COMPARISON:  CT of the chest abdomen pelvis dated 01/01/2021. FINDINGS: Lower chest: Bibasilar atelectasis and bronchiectasis. There is mild cardiomegaly. Coronary vascular calcification. No intra-abdominal free air or free fluid. Hepatobiliary: Indeterminate 12 mm hypodense lesion in the left lobe of the liver. Several additional smaller hypodense lesions in the liver are not characterized. No intrahepatic biliary ductal dilatation. Several stones  in the neck of the gallbladder. There is a small pericholecystic fluid. Further evaluation with ultrasound is recommended to evaluate for possibility of acute cholecystitis. Pancreas: Unremarkable. No pancreatic ductal dilatation or surrounding inflammatory changes. Spleen: Normal in size without focal abnormality.  Adrenals/Urinary Tract: The adrenal glands are unremarkable. There is mild bilateral hydronephrosis. There is an 8 mm enhancing lesion in the left renal pelvis (64/5) concerning for a urothelial neoplasm. Further evaluation with cystoscopy is recommended. Several discrimination there is a 1 cm right renal inferior pole cyst. The visualized ureters appear unremarkable the urinary bladder is distended. There is diffuse trabeculated appearance of the bladder wall consistent with chronic bladder dysfunction. Correlation with urinalysis recommended to exclude cystitis. Stomach/Bowel: There is a large fecal content within the rectal vault. Moderate stool noted throughout the colon. There is sigmoid diverticulosis without active inflammatory changes. There is no bowel obstruction or active inflammation. The appendix is normal. Vascular/Lymphatic: Advanced aortoiliac atherosclerotic disease. IVC is unremarkable. No pain venous gas. There is no adenopathy. Reproductive: Several calcified uterine fibroids noted. Somewhat thickened appearance of the endometrium. This is not evaluated on CT. Further evaluation with pelvic ultrasound on a nonemergent/outpatient basis recommended. Other: Focal area of heterogeneity of the presacral fat similar to prior CT, nonspecific, but may be related to sequela of chronic inflammation. Musculoskeletal: Osteopenia with degenerative changes of the spine. No acute osseous pathology. IMPRESSION: 1. Cholelithiasis with small pericholecystic fluid. Further evaluation with ultrasound is recommended to evaluate for possibility of acute cholecystitis. 2. An 8 mm enhancing lesion in the left renal pelvis most concerning for a urothelial neoplasm. Direct visualization with scope recommended. 3. Constipation with possible fecal content within the rectal vault. No bowel obstruction. Normal appendix. 4. Sigmoid diverticulosis. 5. Mild bilateral hydronephrosis with findings of chronic bladder dysfunction.  Correlation with urinalysis recommended to exclude cystitis. 6. Somewhat thickened appearance of the endometrium. Further evaluation with pelvic ultrasound on a nonemergent/outpatient basis recommended. 7. Aortic Atherosclerosis (ICD10-I70.0). Electronically Signed   By: Anner Crete M.D.   On: 05/12/2021 01:59   DG CHEST PORT 1 VIEW  Result Date: 05/11/2021 CLINICAL DATA:  Hypotension EXAM: PORTABLE CHEST 1 VIEW COMPARISON:  09/11/2020 FINDINGS: Heart is normal size. No confluent airspace opacities or effusions. Aortic atherosclerosis. No acute bony abnormality. IMPRESSION: No active disease. Electronically Signed   By: Rolm Baptise M.D.   On: 05/11/2021 23:36     Assessment and Plan:   Syncope - Occurred in the setting of hypotension with BP initially of 55/41 in the field and heart rate in the 40s. Treated with IV fluids. -patient does not remember anything surrounding the event.  She told me that she was talking on the phone with someone who she cannot remember and she stopped talking and EMS was called but then after questioning her further she said it happened 2 weeks ago - EKG shows no acute changes from prior EKG.  - Initial high-sensitivity troponin negative. - LVEF 65-70% in 12/2020.  - Updated Echo and carotid dopplers pending. - Telemetry shows NSR with no arrhythmias>>continue to follow on tele - will need outpt event monitor to assess for bradyarrhythmias - check Chest CTA to rule out recurrent PE as well as assess aorta  Labile BP Hypertension - BP reportedly 55/41 in the field but markedly hypertensive at times here with BP as high as the 190s/100s and IVF resuscitation.  - would restart Losartan 100m daily  - hold BB for now  Stress Induced Cardiomyopathy - LVEF of  30-35% with wall motion abnormalities consistent with stress induced cardiomyopathy in 08/2020. Improved to 65-70% by echo 12/2020 - 2D echo pending this admission - Home Losartan and Coreg held on admission  due to reports of hypotension in the field. - would restart Losartan given elevated BP and continue to hold carvedilol for now  Penetrating Thoracic Aorta Ulcer with Dissection Flap - Diagnosed in 2021. Stable on last CTA in 12/2017.  PAD - Lower extremity dopplers in 2015 showed ABIs of 0.8 bilaterally with high grade bilateral SFA disease and severe tibial vessel disease. TBIs were in the 0.5 range bilaterally as well. Has been managed conservatively. - No aspirin due to need for DOAC. - See recommendations regarding statin below.  Carotid Artery Disease - Known total occlusion on the right with 80-99% stenosis of left ICA on dopplers in 05/2014. - Repeat dopplers pending.  - No aspirin due to need for DOAC. - See recommendations regarding statin below.  Hyperlipidemia - History of hyperlipidemia but does not appear to be on a statin.  - Will checking fasting lipid panel. - Given advanced age and over all comorbidities there may not be much utility in adding one at this point. She is followed by Palliative Care as an outpatient.  Type 2 Diabetes Mellitus - Hemolgobin A1c 4.5 this admission. - Management per primary team.  Bilateral PE/DVT - On Xarelto 94m daily.  Otherwise, per primary team: - Ileus - Acute lower UTI: on Rocephin - Dementia  Risk Assessment/Risk Scores:                For questions or updates, please contact [facility] Please consult [email or web address] for contact info under    Signed, CDarreld Mclean PA-C  05/12/2021 6:31 AM

## 2021-05-13 ENCOUNTER — Observation Stay (HOSPITAL_COMMUNITY): Payer: Medicare Other

## 2021-05-13 ENCOUNTER — Other Ambulatory Visit (HOSPITAL_COMMUNITY): Payer: Federal, State, Local not specified - PPO

## 2021-05-13 DIAGNOSIS — R55 Syncope and collapse: Secondary | ICD-10-CM

## 2021-05-13 DIAGNOSIS — Z8249 Family history of ischemic heart disease and other diseases of the circulatory system: Secondary | ICD-10-CM | POA: Diagnosis not present

## 2021-05-13 DIAGNOSIS — F0390 Unspecified dementia without behavioral disturbance: Secondary | ICD-10-CM | POA: Diagnosis present

## 2021-05-13 DIAGNOSIS — E1151 Type 2 diabetes mellitus with diabetic peripheral angiopathy without gangrene: Secondary | ICD-10-CM | POA: Diagnosis present

## 2021-05-13 DIAGNOSIS — I6523 Occlusion and stenosis of bilateral carotid arteries: Secondary | ICD-10-CM | POA: Diagnosis present

## 2021-05-13 DIAGNOSIS — I9589 Other hypotension: Secondary | ICD-10-CM | POA: Diagnosis present

## 2021-05-13 DIAGNOSIS — R001 Bradycardia, unspecified: Secondary | ICD-10-CM | POA: Diagnosis present

## 2021-05-13 DIAGNOSIS — K567 Ileus, unspecified: Secondary | ICD-10-CM | POA: Diagnosis present

## 2021-05-13 DIAGNOSIS — Z8616 Personal history of COVID-19: Secondary | ICD-10-CM | POA: Diagnosis not present

## 2021-05-13 DIAGNOSIS — E860 Dehydration: Secondary | ICD-10-CM | POA: Diagnosis present

## 2021-05-13 DIAGNOSIS — I10 Essential (primary) hypertension: Secondary | ICD-10-CM | POA: Diagnosis not present

## 2021-05-13 DIAGNOSIS — Z7901 Long term (current) use of anticoagulants: Secondary | ICD-10-CM | POA: Diagnosis not present

## 2021-05-13 DIAGNOSIS — I2510 Atherosclerotic heart disease of native coronary artery without angina pectoris: Secondary | ICD-10-CM | POA: Diagnosis present

## 2021-05-13 DIAGNOSIS — E7800 Pure hypercholesterolemia, unspecified: Secondary | ICD-10-CM | POA: Diagnosis not present

## 2021-05-13 DIAGNOSIS — Z8679 Personal history of other diseases of the circulatory system: Secondary | ICD-10-CM | POA: Diagnosis not present

## 2021-05-13 DIAGNOSIS — E785 Hyperlipidemia, unspecified: Secondary | ICD-10-CM | POA: Diagnosis present

## 2021-05-13 DIAGNOSIS — Z87891 Personal history of nicotine dependence: Secondary | ICD-10-CM | POA: Diagnosis not present

## 2021-05-13 DIAGNOSIS — Z20822 Contact with and (suspected) exposure to covid-19: Secondary | ICD-10-CM | POA: Diagnosis present

## 2021-05-13 DIAGNOSIS — N390 Urinary tract infection, site not specified: Secondary | ICD-10-CM | POA: Diagnosis present

## 2021-05-13 DIAGNOSIS — Z888 Allergy status to other drugs, medicaments and biological substances status: Secondary | ICD-10-CM | POA: Diagnosis not present

## 2021-05-13 DIAGNOSIS — I252 Old myocardial infarction: Secondary | ICD-10-CM | POA: Diagnosis not present

## 2021-05-13 DIAGNOSIS — I440 Atrioventricular block, first degree: Secondary | ICD-10-CM | POA: Diagnosis present

## 2021-05-13 DIAGNOSIS — I5042 Chronic combined systolic (congestive) and diastolic (congestive) heart failure: Secondary | ICD-10-CM | POA: Diagnosis present

## 2021-05-13 DIAGNOSIS — Z823 Family history of stroke: Secondary | ICD-10-CM | POA: Diagnosis not present

## 2021-05-13 DIAGNOSIS — Z833 Family history of diabetes mellitus: Secondary | ICD-10-CM | POA: Diagnosis not present

## 2021-05-13 DIAGNOSIS — Z66 Do not resuscitate: Secondary | ICD-10-CM | POA: Diagnosis present

## 2021-05-13 DIAGNOSIS — I110 Hypertensive heart disease with heart failure: Secondary | ICD-10-CM | POA: Diagnosis present

## 2021-05-13 LAB — GLUCOSE, CAPILLARY
Glucose-Capillary: 122 mg/dL — ABNORMAL HIGH (ref 70–99)
Glucose-Capillary: 124 mg/dL — ABNORMAL HIGH (ref 70–99)
Glucose-Capillary: 129 mg/dL — ABNORMAL HIGH (ref 70–99)
Glucose-Capillary: 61 mg/dL — ABNORMAL LOW (ref 70–99)
Glucose-Capillary: 76 mg/dL (ref 70–99)
Glucose-Capillary: 77 mg/dL (ref 70–99)

## 2021-05-13 LAB — URINE CULTURE

## 2021-05-13 LAB — ECHOCARDIOGRAM COMPLETE
AR max vel: 2.32 cm2
AV Area VTI: 2.73 cm2
AV Area mean vel: 2.24 cm2
AV Mean grad: 3 mmHg
AV Peak grad: 5.3 mmHg
Ao pk vel: 1.15 m/s
Area-P 1/2: 3.77 cm2
Calc EF: 76.3 %
Height: 65 in
S' Lateral: 2.5 cm
Single Plane A2C EF: 76.7 %
Single Plane A4C EF: 70.3 %
Weight: 2352.75 oz

## 2021-05-13 MED ORDER — LOSARTAN POTASSIUM 50 MG PO TABS: 50.0000 mg | ORAL_TABLET | Freq: Every day | ORAL | Status: DC

## 2021-05-13 MED ORDER — LOSARTAN POTASSIUM 50 MG PO TABS
25.0000 mg | ORAL_TABLET | Freq: Every day | ORAL | Status: DC
  Administered 2021-05-13 – 2021-05-14 (×2): 25 mg via ORAL
  Filled 2021-05-13 (×2): qty 1

## 2021-05-13 MED ORDER — SENNA 8.6 MG PO TABS
1.0000 | ORAL_TABLET | Freq: Every day | ORAL | Status: DC
  Administered 2021-05-13 – 2021-05-14 (×2): 8.6 mg via ORAL
  Filled 2021-05-13 (×2): qty 1

## 2021-05-13 NOTE — Progress Notes (Signed)
PROGRESS NOTE    Kaitlin Smith  WUJ:[id number] DOB: November 23, 1929 DOA: 05/11/2021 PCP: Alysia Penna, MD    Brief Narrative:  87 y.o. female with medical history significant of CAD, diabetes mellitus type 2, hyperlipidemia, dementia, PEs , CVA. Chronic combined CHF history of COVID in 2021 hypertension peripheral vascular disease coronary arterial disease, dementia. Pt presented with syncope with labile blood pressures on presentation  Assessment & Plan:   Active Problems:   DM2 (diabetes mellitus, type 2) (HCC)   History of CVA (cerebrovascular accident)   Essential hypertension   Hyperlipidemia   Acute lower UTI   Dehydration   Syncope   Chronic combined systolic (congestive) and diastolic (congestive) heart failure (HCC)   Ileus (HCC)   Syncope  -Noted to be in the setting of transient hypotension and bradycardia unclear etiology patient not endorsing chest pain shortness of breath at this time.   -Cardiology is following. -2d echo with EF>75% -B carotid dopplers reviewed, findings of total occlusion on R and 80-99% on L, which is unchanged from similar study in 2015, reviewed. Hx dementia, advanced age, doubt pt would be an ideal candidate for aggressive revascularization. Would discuss with family -BP noted to be labile per Cardiology, gradually resuming BP meds noted   Ileus - -KUB worrisome for ileus. -Follow-up CT abdomen personally reviewed.  Findings notable for retained fecal material and constipation -Recommend continued bowel regimen    Hyperlipidemia  -Noted to be not on statin    Essential hypertension -  -BP meds gradually being resumed per Cardiology -Pt noted to have labile BP    Acute lower UTI - -  -Continued with Rocephin  -Urine culture reviewed, inconclusive findings    Chronic combined systolic (congestive) and diastolic (congestive) heart failure (HCC) gently rehydrate   Hx of PE - continue xarelto   Dm2 -continue sliding scale insulin      Dementia chronic- monitor for signs of sun downing -Mildly confused and agitated this morning  Left renal pelvis lesion -Personally reviewed on abdominal CT.  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  Recommendation to focus on treating constipation and distended bladder with plan for follow-up following resolution of above to reassess as outpatient   DVT prophylaxis: Xarelto Code Status: DNR Family Communication: Pt in room, family not at bedside  Status is: Observation  The patient remains OBS appropriate and will d/c before 2 midnights.  Dispo: The patient is from: Home              Anticipated d/c is to: Home              Patient currently is not medically stable to d/c.   Difficult to place patient No       Consultants:  Cardiology  Procedures:    Antimicrobials: Anti-infectives (From admission, onward)    Start     Dose/Rate Route Frequency Ordered Stop   05/11/21 2230  cefTRIAXone (ROCEPHIN) 2 g in sodium chloride 0.9 % 100 mL IVPB        2 g 200 mL/hr over 30 Minutes Intravenous Every 24 hours 05/11/21 2224 05/13/21 2359       Subjective: Without complaints this AM  Objective: Vitals:   05/13/21 0454 05/13/21 1400 05/13/21 1401 05/13/21 1402  BP: (!) 159/73 130/73 (!) 142/65 (!) 155/96  Pulse: 84     Resp: 16     Temp: 98 F (36.7 C)     TempSrc:  Oral     SpO2: 100%     Weight:      Height:        Intake/Output Summary (Last 24 hours) at 05/13/2021 1808 Last data filed at 05/13/2021 1324 Gross per 24 hour  Intake 2005 ml  Output 400 ml  Net 1605 ml    Filed Weights   05/11/21 1654  Weight: 66.7 kg    Examination: General exam: Conversant, in no acute distress Respiratory system: normal chest rise, clear, no audible wheezing Cardiovascular system: regular rhythm, s1-s2 Gastrointestinal system: Nondistended, nontender, pos BS Central nervous system: No seizures, no  tremors Extremities: No cyanosis, no joint deformities Skin: No rashes, no pallor Psychiatry: Affect normal // no auditory hallucinations   Data Reviewed: I have personally reviewed following labs and imaging studies  CBC: Recent Labs  Lab 05/11/21 1905 05/12/21 0420  WBC 6.9 5.3  NEUTROABS 5.5 3.3  HGB 13.3 13.8  HCT 41.1 41.6  MCV 98.8 98.1  PLT 216 252    Basic Metabolic Panel: Recent Labs  Lab 05/11/21 1905 05/12/21 0420  NA 141 139  K 5.1 4.7  CL 106 105  CO2 29 28  GLUCOSE 98 99  BUN 7* 8  CREATININE 0.43* 0.65  CALCIUM 9.3 9.0  MG  --  1.9  PHOS  --  3.4    GFR: Estimated Creatinine Clearance: 42.9 mL/min (by C-G formula based on SCr of 0.65 mg/dL). Liver Function Tests: Recent Labs  Lab 05/11/21 1905 05/12/21 0420  AST 16 14*  ALT 12 11  ALKPHOS 67 73  BILITOT 0.7 0.6  PROT 6.1* 6.0*  ALBUMIN 3.3* 3.2*    No results for input(s): LIPASE, AMYLASE in the last 168 hours. No results for input(s): AMMONIA in the last 168 hours. Coagulation Profile: Recent Labs  Lab 05/11/21 1905  INR 1.9*    Cardiac Enzymes: No results for input(s): CKTOTAL, CKMB, CKMBINDEX, TROPONINI in the last 168 hours. BNP (last 3 results) No results for input(s): PROBNP in the last 8760 hours. HbA1C: Recent Labs    05/11/21 2258  HGBA1C 4.5*    CBG: Recent Labs  Lab 05/12/21 2051 05/13/21 0027 05/13/21 0456 05/13/21 0754 05/13/21 1205  GLUCAP 112* 122* 77 61* 124*    Lipid Profile: No results for input(s): CHOL, HDL, LDLCALC, TRIG, CHOLHDL, LDLDIRECT in the last 72 hours. Thyroid Function Tests: Recent Labs    05/12/21 0420  TSH 0.688    Anemia Panel: No results for input(s): VITAMINB12, FOLATE, FERRITIN, TIBC, IRON, RETICCTPCT in the last 72 hours. Sepsis Labs: Recent Labs  Lab 05/11/21 2224 05/11/21 2257  PROCALCITON <0.10  --   LATICACIDVEN  --  1.0     Recent Results (from the past 240 hour(s))  Urine Culture     Status: Abnormal    Collection Time: 05/11/21 10:05 PM   Specimen: Urine, Clean Catch  Result Value Ref Range Status   Specimen Description   Final    URINE, CLEAN CATCH Performed at [facility], [address] [place], [place] [address]    Special Requests   Final    NONE Performed at [facility], [address] [place], [place] [address]    Culture MULTIPLE SPECIES PRESENT, SUGGEST RECOLLECTION (A)  Final   Report Status 05/13/2021 FINAL  Final  Culture, blood (x 2)     Status: None (Preliminary result)   Collection Time: 05/11/21 10:57 PM   Specimen: BLOOD  Result Value Ref  Range Status   Specimen Description   Final    BLOOD BLOOD RIGHT HAND Performed at [facility], [address] [place], [place] [address]    Special Requests   Final    BOTTLES DRAWN AEROBIC AND ANAEROBIC Blood Culture adequate volume Performed at [facility], [address], [place], [place] [address]    Culture   Final    NO GROWTH 1 DAY Performed at [facility], [address], [place], [place] [address]    Report Status PENDING  Incomplete  SARS CORONAVIRUS 2 (TAT 6-24 HRS) Nasopharyngeal Nasopharyngeal Swab     Status: None   Collection Time: 05/11/21 10:57 PM   Specimen: Nasopharyngeal Swab  Result Value Ref Range Status   SARS Coronavirus 2 NEGATIVE NEGATIVE Final    Comment: (NOTE) SARS-CoV-2 target nucleic acids are NOT DETECTED.  The SARS-CoV-2 RNA is generally detectable in upper and lower respiratory specimens during the acute phase of infection. Negative results do not preclude SARS-CoV-2 infection, do not rule out co-infections with other pathogens, and should not be used as the sole basis for treatment or other patient management decisions. Negative results must be combined with clinical observations, patient history, and epidemiological information. The expected result is Negative.  Fact Sheet for  Patients: HairSlick.no  Fact Sheet for Healthcare Providers: quierodirigir.com  This test is not yet approved or cleared by the [place] FDA and  has been authorized for detection and/or diagnosis of SARS-CoV-2 by FDA under an Emergency Use Authorization (EUA). This EUA will remain  in effect (meaning this test can be used) for the duration of the COVID-19 declaration under Se ction 564(b)(1) of the Act, 21 U.S.C. section 360bbb-3(b)(1), unless the authorization is terminated or revoked sooner.  Performed at [facility], [address], [place], [place] [address]   Culture, blood (x 2)     Status: None (Preliminary result)   Collection Time: 05/11/21 10:58 PM   Specimen: BLOOD  Result Value Ref Range Status   Specimen Description   Final    BLOOD LEFT ANTECUBITAL Performed at [facility], [address], [place], [place] [address]    Special Requests   Final    BOTTLES DRAWN AEROBIC AND ANAEROBIC Blood Culture adequate volume Performed at [facility], [address], [place], [place] [address]    Culture   Final    NO GROWTH 1 DAY Performed at [facility], [address], [place], [place] [address]    Report Status PENDING  Incomplete      Radiology Studies: DG Abd 1 View  Result Date: 05/12/2021 CLINICAL DATA:  Abdominal distention EXAM: ABDOMEN - 1 VIEW COMPARISON:  None. FINDINGS: Gas and stool-filled colon with gas-filled small bowel. No small or large bowel distention suggesting likely ileus. No radiopaque stones. Degenerative changes in the spine. Prominent vascular calcifications. IMPRESSION: Gas-filled nondilated small and large bowel likely representing ileus. Electronically Signed   By: Burman Nieves M.D.   On: 05/12/2021 00:37   CT Angio Chest Pulmonary Embolism (PE) W or WO Contrast  Result Date: 05/12/2021 CLINICAL DATA:  Syncope, simple, normal  neuro exam PE suspected, high prob EXAM: CT ANGIOGRAPHY CHEST WITH CONTRAST TECHNIQUE: Multidetector CT imaging of the chest was performed using the standard protocol during bolus administration of intravenous contrast. Multiplanar CT image reconstructions and MIPs were obtained to evaluate the vascular anatomy. CONTRAST:  38mL OMNIPAQUE IOHEXOL 350 MG/ML SOLN  COMPARISON:  April 2022 FINDINGS: Cardiovascular: Satisfactory opacification of the pulmonary arteries to the segmental level. No evidence of pulmonary embolism. Diffuse atherosclerotic calcification of the thoracic aorta. Normal heart size. No pericardial effusion. Mediastinum/Nodes: No enlarged mediastinal, hilar, or axillary lymph nodes. Multinodular thyroid gland, right-sided calcifications. Lungs/Pleura: No pleural effusion. No pneumothorax. There is volume loss with ground-glass consolidation the bilateral lobes with traction bronchiectasis. No suspicious pulmonary nodules. Musculoskeletal: No aggressive osseous lesions. Upper abdomen: Cholelithiasis. Review of the MIP images confirms the above findings. IMPRESSION: 1. No pulmonary embolism. 2. In the bilateral lower lobes, there is volume loss with ground-glass consolidation and traction bronchiectasis. These findings are reflective of pulmonary fibrosis, and can be seen with interstitial lung diseases. 3. Multinodular thyroid gland. Typically an ultrasound would be recommended, however, in the setting of significant comorbidities or limited life expectancy, no follow-up is recommended (ref: J Am Coll Radiol. 2015 Feb;12(2): 143-50). 4. Cholelithiasis. Electronically Signed   By: Olive Bass M.D.   On: 05/12/2021 14:59   CT ABDOMEN PELVIS W CONTRAST  Result Date: 05/12/2021 CLINICAL DATA:  Concern for bowel obstruction. EXAM: CT ABDOMEN AND PELVIS WITH CONTRAST TECHNIQUE: Multidetector CT imaging of the abdomen and pelvis was performed using the standard protocol following bolus administration of  intravenous contrast. CONTRAST:  80mL OMNIPAQUE IOHEXOL 350 MG/ML SOLN COMPARISON:  CT of the chest abdomen pelvis dated 01/01/2021. FINDINGS: Lower chest: Bibasilar atelectasis and bronchiectasis. There is mild cardiomegaly. Coronary vascular calcification. No intra-abdominal free air or free fluid. Hepatobiliary: Indeterminate 12 mm hypodense lesion in the left lobe of the liver. Several additional smaller hypodense lesions in the liver are not characterized. No intrahepatic biliary ductal dilatation. Several stones in the neck of the gallbladder. There is a small pericholecystic fluid. Further evaluation with ultrasound is recommended to evaluate for possibility of acute cholecystitis. Pancreas: Unremarkable. No pancreatic ductal dilatation or surrounding inflammatory changes. Spleen: Normal in size without focal abnormality. Adrenals/Urinary Tract: The adrenal glands are unremarkable. There is mild bilateral hydronephrosis. There is an 8 mm enhancing lesion in the left renal pelvis (64/5) concerning for a urothelial neoplasm. Further evaluation with cystoscopy is recommended. Several discrimination there is a 1 cm right renal inferior pole cyst. The visualized ureters appear unremarkable the urinary bladder is distended. There is diffuse trabeculated appearance of the bladder wall consistent with chronic bladder dysfunction. Correlation with urinalysis recommended to exclude cystitis. Stomach/Bowel: There is a large fecal content within the rectal vault. Moderate stool noted throughout the colon. There is sigmoid diverticulosis without active inflammatory changes. There is no bowel obstruction or active inflammation. The appendix is normal. Vascular/Lymphatic: Advanced aortoiliac atherosclerotic disease. IVC is unremarkable. No pain venous gas. There is no adenopathy. Reproductive: Several calcified uterine fibroids noted. Somewhat thickened appearance of the endometrium. This is not evaluated on CT. Further  evaluation with pelvic ultrasound on a nonemergent/outpatient basis recommended. Other: Focal area of heterogeneity of the presacral fat similar to prior CT, nonspecific, but may be related to sequela of chronic inflammation. Musculoskeletal: Osteopenia with degenerative changes of the spine. No acute osseous pathology. IMPRESSION: 1. Cholelithiasis with small pericholecystic fluid. Further evaluation with ultrasound is recommended to evaluate for possibility of acute cholecystitis. 2. An 8 mm enhancing lesion in the left renal pelvis most concerning for a urothelial neoplasm. Direct visualization with scope recommended. 3. Constipation with possible fecal content within the rectal vault. No bowel obstruction. Normal appendix. 4. Sigmoid diverticulosis. 5. Mild bilateral hydronephrosis with findings of chronic bladder dysfunction. Correlation with  urinalysis recommended to exclude cystitis. 6. Somewhat thickened appearance of the endometrium. Further evaluation with pelvic ultrasound on a nonemergent/outpatient basis recommended. 7. Aortic Atherosclerosis (ICD10-I70.0). Electronically Signed   By: Elgie Collard M.D.   On: 05/12/2021 01:59   DG CHEST PORT 1 VIEW  Result Date: 05/11/2021 CLINICAL DATA:  Hypotension EXAM: PORTABLE CHEST 1 VIEW COMPARISON:  09/11/2020 FINDINGS: Heart is normal size. No confluent airspace opacities or effusions. Aortic atherosclerosis. No acute bony abnormality. IMPRESSION: No active disease. Electronically Signed   By: Charlett Nose M.D.   On: 05/11/2021 23:36   ECHOCARDIOGRAM COMPLETE  Result Date: 05/13/2021    ECHOCARDIOGRAM REPORT   Patient Name:   Kaitlin Smith Date of Exam: 05/13/2021 Medical Rec #:  [id number]    Height:       65.0 in Accession #:    [id number]   Weight:       147.0 lb Date of Birth:  1930-09-10    BSA:          1.736 m Patient Age:    89 years     BP:           159/73 mmHg Patient Gender: F            HR:           86 bpm. Exam Location:  Inpatient  Procedure: 2D Echo, Cardiac Doppler and Color Doppler STAT ECHO Indications:    Pulmonary embolus                 Syncope  History:        Patient has prior history of Echocardiogram examinations, most                 recent 01/03/2021. CHF, Previous Myocardial Infarction, Abnormal                 ECG, Stroke, Signs/Symptoms:Syncope; Risk Factors:Diabetes,                 Hypertension and Dyslipidemia.  Sonographer:    Neomia Dear RDCS Referring Phys: [id number] TRACI R TURNER IMPRESSIONS  1. Hyperdynamic LV function; moderate LVH with proximal septal thickening.  2. Left ventricular ejection fraction, by estimation, is >75%. The left ventricle has hyperdynamic function. The left ventricle has no regional wall motion abnormalities. There is moderate left ventricular hypertrophy. Left ventricular diastolic parameters are consistent with Grade I diastolic dysfunction (impaired relaxation).  3. Right ventricular systolic function is mildly reduced. The right ventricular size is normal.  4. The mitral valve is normal in structure. No evidence of mitral valve regurgitation. No evidence of mitral stenosis.  5. The aortic valve is tricuspid. Aortic valve regurgitation is not visualized. Mild to moderate aortic valve sclerosis/calcification is present, without any evidence of aortic stenosis. FINDINGS  Left Ventricle: Left ventricular ejection fraction, by estimation, is >75%. The left ventricle has hyperdynamic function. The left ventricle has no regional wall motion abnormalities. The left ventricular internal cavity size was normal in size. There is moderate left ventricular hypertrophy. Left ventricular diastolic parameters are consistent with Grade I diastolic dysfunction (impaired relaxation). Right Ventricle: The right ventricular size is normal. Right ventricular systolic function is mildly reduced. Left Atrium: Left atrial size was normal in size. Right Atrium: Right atrial size was normal in size. Pericardium: Trivial  pericardial effusion is present. Mitral Valve: The mitral valve is normal in structure. Mild mitral annular calcification. No evidence of mitral valve regurgitation. No evidence of mitral valve stenosis. Tricuspid  Valve: The tricuspid valve is normal in structure. Tricuspid valve regurgitation is not demonstrated. No evidence of tricuspid stenosis. Aortic Valve: The aortic valve is tricuspid. Aortic valve regurgitation is not visualized. Mild to moderate aortic valve sclerosis/calcification is present, without any evidence of aortic stenosis. Aortic valve mean gradient measures 3.0 mmHg. Aortic valve peak gradient measures 5.3 mmHg. Aortic valve area, by VTI measures 2.73 cm. Pulmonic Valve: The pulmonic valve was not well visualized. Pulmonic valve regurgitation is not visualized. No evidence of pulmonic stenosis. Aorta: The aortic root is normal in size and structure. Venous: The inferior vena cava was not well visualized. IAS/Shunts: The interatrial septum was not well visualized. Additional Comments: Hyperdynamic LV function; moderate LVH with proximal septal thickening.  LEFT VENTRICLE PLAX 2D LVIDd:         3.10 cm     Diastology LVIDs:         2.50 cm     LV e' medial:    3.81 cm/s LV PW:         1.90 cm     LV E/e' medial:  14.8 LV IVS:        2.10 cm     LV e' lateral:   6.85 cm/s LVOT diam:     2.00 cm     LV E/e' lateral: 8.2 LV SV:         41 LV SV Index:   24 LVOT Area:     3.14 cm  LV Volumes (MOD) LV vol d, MOD A2C: 21.1 ml LV vol d, MOD A4C: 31.5 ml LV vol s, MOD A2C: 4.9 ml LV vol s, MOD A4C: 9.4 ml LV SV MOD A2C:     16.2 ml LV SV MOD A4C:     31.5 ml LV SV MOD BP:      22.5 ml RIGHT VENTRICLE RV Basal diam:  2.00 cm RV Mid diam:    1.80 cm RV S prime:     12.10 cm/s TAPSE (M-mode): 1.1 cm LEFT ATRIUM             Index      RIGHT ATRIUM          Index LA Vol (A2C):   16.0 ml 9.22 ml/m RA Area:     6.98 cm LA Vol (A4C):   13.9 ml 8.01 ml/m RA Volume:   12.10 ml 6.97 ml/m LA Biplane Vol: 15.7  ml 9.05 ml/m  AORTIC VALVE AV Area (Vmax):    2.32 cm AV Area (Vmean):   2.24 cm AV Area (VTI):     2.73 cm AV Vmax:           115.00 cm/s AV Vmean:          76.600 cm/s AV VTI:            0.151 m AV Peak Grad:      5.3 mmHg AV Mean Grad:      3.0 mmHg LVOT Vmax:         84.80 cm/s LVOT Vmean:        54.700 cm/s LVOT VTI:          0.131 m LVOT/AV VTI ratio: 0.87  AORTA Ao Root diam: 2.90 cm MITRAL VALVE MV Area (PHT): 3.77 cm    SHUNTS MV Decel Time: 201 msec    Systemic VTI:  0.13 m MV E velocity: 56.30 cm/s  Systemic Diam: 2.00 cm MV A velocity: 82.00 cm/s MV E/A ratio:  0.69  Olga Millers MD Electronically signed by Olga Millers MD Signature Date/Time: 05/13/2021/3:04:16 PM    Final    VAS US CAROTID  Result Date: 05/13/2021 Carotid Arterial Duplex Study Patient Name:  Kaitlin Smith  Date of Exam:   05/13/2021 Medical Rec #: [id number]     Accession #:    [id number] Date of Birth: 1929-09-27     Patient Gender: F Patient Age:   64 years Exam Location:  [facility] Procedure:      VAS US CAROTID Referring Phys: Gloris Manchester TURNER --------------------------------------------------------------------------------  Indications:       Syncope. Risk Factors:      Hypertension, hyperlipidemia, Diabetes, past history of                    smoking, prior MI, coronary artery disease, prior CVA, PAD. Other Factors:     CHF, AO dissection, Carotid stenosis/occlusion. Limitations        Today's exam was limited due to the body habitus of the                    patient, heavy calcification and the resulting shadowing and                    high bifurcation. Comparison Study:  Previous exam 05/13/2014 - RT ICA occlusion & LT ICA 80-99% -                    no history of intervention Performing Technologist: Jody Hill RVT, RDMS  Examination Guidelines: A complete evaluation includes B-mode imaging, spectral Doppler, color Doppler, and power Doppler as needed of all accessible portions of each vessel. Bilateral testing is  considered an integral part of a complete examination. Limited examinations for reoccurring indications may be performed as noted.  Right Carotid Findings: +----------+--------+--------+--------+---------------------+------------------+           PSV cm/sEDV cm/sStenosisPlaque Description   Comments           +----------+--------+--------+--------+---------------------+------------------+ CCA Prox  72      0                                    intimal thickening +----------+--------+--------+--------+---------------------+------------------+ CCA Distal65      0               homogeneous and      intimal thickening                                   smooth                                  +----------+--------+--------+--------+---------------------+------------------+ ICA Prox                  Occluded                                        +----------+--------+--------+--------+---------------------+------------------+ ICA Distal  Not visualized     +----------+--------+--------+--------+---------------------+------------------+ ECA       45      0               calcific             Not well                                                                  visualized         +----------+--------+--------+--------+---------------------+------------------+ +----------+--------+-------+----------------+-------------------+           PSV cm/sEDV cmsDescribe        Arm Pressure (mmHG) +----------+--------+-------+----------------+-------------------+ [id number]            Multiphasic, WNL                    +----------+--------+-------+----------------+-------------------+ +---------+--------+--+--------+-+---------+ VertebralPSV cm/s27EDV cm/s5Antegrade +---------+--------+--+--------+-+---------+ Very high bifurcation - difficult to visualized ICA/ECA Left Carotid Findings:  +----------+--------+--------+--------+-------------------+--------------------+           PSV cm/sEDV cm/sStenosisPlaque Description Comments             +----------+--------+--------+--------+-------------------+--------------------+ CCA Prox  71      16              calcific and smoothintimal thickening   +----------+--------+--------+--------+-------------------+--------------------+ CCA Distal79      12              calcific and smoothintimal thickening   +----------+--------+--------+--------+-------------------+--------------------+ ICA Prox  393     127     80-99%  irregular and      Shadowing                                              calcific                                +----------+--------+--------+--------+-------------------+--------------------+ ICA Mid   66      17                                                      +----------+--------+--------+--------+-------------------+--------------------+ ICA Distal61      20                                                      +----------+--------+--------+--------+-------------------+--------------------+ ECA       50      9                                  Difficult to  visualized -                                                              shadowing            +----------+--------+--------+--------+-------------------+--------------------+ +----------+--------+--------+----------------+-------------------+           PSV cm/sEDV cm/sDescribe        Arm Pressure (mmHG) +----------+--------+--------+----------------+-------------------+ [id number]              Multiphasic, WNL                    +----------+--------+--------+----------------+-------------------+ +---------+--------+--+--------+--+---------+ VertebralPSV cm/s35EDV cm/s11Antegrade +---------+--------+--+--------+--+---------+   Summary:  Right Carotid: Evidence consistent with a total occlusion of the right ICA.                Non-hemodynamically significant plaque <50% noted in the CCA. The                ECA appears <50% stenosed. Left Carotid: Velocities in the left ICA are consistent with a 80-99% stenosis.               Non-hemodynamically significant plaque <50% noted in the CCA. The               ECA appears <50% stenosed. Vertebrals:  Bilateral vertebral arteries demonstrate antegrade flow. Subclavians: Normal flow hemodynamics were seen in bilateral subclavian              arteries. *See table(s) above for measurements and observations.  Electronically signed by Heath Lark on 05/13/2021 at 5:59:12 PM.    Final     Scheduled Meds:  brinzolamide  1 drop Both Eyes TID   And   brimonidine  1 drop Both Eyes TID   insulin aspart  0-9 Units Subcutaneous Q4H   losartan  25 mg Oral Daily   pantoprazole  40 mg Oral Daily   rivaroxaban  20 mg Oral Daily   senna  1 tablet Oral Daily   tamsulosin  0.4 mg Oral Daily   Continuous Infusions:  cefTRIAXone (ROCEPHIN)  IV 2 g (05/12/21 2137)     LOS: 0 days   Rickey Barbara, MD [facility] Pager On Amion  If 7PM-7AM, please contact night-coverage 05/13/2021, 6:08 PM

## 2021-05-13 NOTE — Progress Notes (Signed)
*  PRELIMINARY RESULTS* Echocardiogram 2D Echocardiogram has been performed.  Neomia Dear RDCS 05/13/2021, 2:46 PM

## 2021-05-13 NOTE — Progress Notes (Addendum)
Progress Note  Patient Name: Kaitlin Smith Date of Encounter: 05/13/2021  [facility] Cardiologist: Nanetta Batty, MD   Subjective   Denies any chest pain or SOB.  No dizziness or further syncope.  Chest CTA showed no PE and pulmonary fibrosis. 2D echo still pending as well as carotid dopplers.    Inpatient Medications    Scheduled Meds:  brinzolamide  1 drop Both Eyes TID   And   brimonidine  1 drop Both Eyes TID   insulin aspart  0-9 Units Subcutaneous Q4H   pantoprazole  40 mg Oral Daily   rivaroxaban  20 mg Oral Daily   senna  1 tablet Oral Daily   tamsulosin  0.4 mg Oral Daily   Continuous Infusions:  cefTRIAXone (ROCEPHIN)  IV 2 g (05/12/21 2137)   PRN Meds: acetaminophen **OR** acetaminophen, haloperidol lactate   Vital Signs    Vitals:   05/12/21 1540 05/12/21 2053 05/13/21 0024 05/13/21 0454  BP: (!) 166/76 (!) 149/74 134/66 (!) 159/73  Pulse: 92 88 87 84  Resp: 16 18 16 16   Temp: 98.2 F (36.8 C) 97.9 F (36.6 C) 97.9 F (36.6 C) 98 F (36.7 C)  TempSrc: Oral Oral Oral Oral  SpO2: 100% 98% 97% 100%  Weight:      Height:        Intake/Output Summary (Last 24 hours) at 05/13/2021 1326 Last data filed at 05/13/2021 0955 Gross per 24 hour  Intake 1865 ml  Output 550 ml  Net 1315 ml   Last 3 Weights 05/11/2021 01/01/2021 09/13/2020  Weight (lbs) 147 lb 0.8 oz 147 lb 133 lb 6.1 oz  Weight (kg) 66.7 kg 66.679 kg 60.5 kg      Telemetry    NSR no arrhythmias - Personally Reviewed  ECG    No new EKG to review - Personally Reviewed  Physical Exam   GEN: No acute distress.   Neck: No JVD Cardiac: RRR, no murmurs, rubs, or gallops.  Respiratory: Clear to auscultation bilaterally. GI: Soft, nontender, non-distended  MS: No edema; No deformity. Neuro:  Nonfocal  Psych: Normal affect   Labs    High Sensitivity Troponin:   Recent Labs  Lab 05/11/21 2224  TROPONINIHS 10      Chemistry Recent Labs  Lab 05/11/21 1905 05/12/21 0420   NA 141 139  K 5.1 4.7  CL 106 105  CO2 29 28  GLUCOSE 98 99  BUN 7* 8  CREATININE 0.43* 0.65  CALCIUM 9.3 9.0  PROT 6.1* 6.0*  ALBUMIN 3.3* 3.2*  AST 16 14*  ALT 12 11  ALKPHOS 67 73  BILITOT 0.7 0.6  GFRNONAA >60 >60  ANIONGAP 6 6     Hematology Recent Labs  Lab 05/11/21 1905 05/12/21 0420  WBC 6.9 5.3  RBC 4.16 4.24  HGB 13.3 13.8  HCT 41.1 41.6  MCV 98.8 98.1  MCH 32.0 32.5  MCHC 32.4 33.2  RDW 12.3 12.2  PLT 216 252    BNPNo results for input(s): BNP, PROBNP in the last 168 hours.   DDimer No results for input(s): DDIMER in the last 168 hours.  Radiology    DG Abd 1 View  Result Date: 05/12/2021 CLINICAL DATA:  Abdominal distention EXAM: ABDOMEN - 1 VIEW COMPARISON:  None. FINDINGS: Gas and stool-filled colon with gas-filled small bowel. No small or large bowel distention suggesting likely ileus. No radiopaque stones. Degenerative changes in the spine. Prominent vascular calcifications. IMPRESSION: Gas-filled nondilated small and large bowel  likely representing ileus. Electronically Signed   By: Burman Nieves M.D.   On: 05/12/2021 00:37   CT Angio Chest Pulmonary Embolism (PE) W or WO Contrast  Result Date: 05/12/2021 CLINICAL DATA:  Syncope, simple, normal neuro exam PE suspected, high prob EXAM: CT ANGIOGRAPHY CHEST WITH CONTRAST TECHNIQUE: Multidetector CT imaging of the chest was performed using the standard protocol during bolus administration of intravenous contrast. Multiplanar CT image reconstructions and MIPs were obtained to evaluate the vascular anatomy. CONTRAST:  66mL OMNIPAQUE IOHEXOL 350 MG/ML SOLN COMPARISON:  April 2022 FINDINGS: Cardiovascular: Satisfactory opacification of the pulmonary arteries to the segmental level. No evidence of pulmonary embolism. Diffuse atherosclerotic calcification of the thoracic aorta. Normal heart size. No pericardial effusion. Mediastinum/Nodes: No enlarged mediastinal, hilar, or axillary lymph nodes. Multinodular  thyroid gland, right-sided calcifications. Lungs/Pleura: No pleural effusion. No pneumothorax. There is volume loss with ground-glass consolidation the bilateral lobes with traction bronchiectasis. No suspicious pulmonary nodules. Musculoskeletal: No aggressive osseous lesions. Upper abdomen: Cholelithiasis. Review of the MIP images confirms the above findings. IMPRESSION: 1. No pulmonary embolism. 2. In the bilateral lower lobes, there is volume loss with ground-glass consolidation and traction bronchiectasis. These findings are reflective of pulmonary fibrosis, and can be seen with interstitial lung diseases. 3. Multinodular thyroid gland. Typically an ultrasound would be recommended, however, in the setting of significant comorbidities or limited life expectancy, no follow-up is recommended (ref: J Am Coll Radiol. 2015 Feb;12(2): 143-50). 4. Cholelithiasis. Electronically Signed   By: Olive Bass M.D.   On: 05/12/2021 14:59   CT ABDOMEN PELVIS W CONTRAST  Result Date: 05/12/2021 CLINICAL DATA:  Concern for bowel obstruction. EXAM: CT ABDOMEN AND PELVIS WITH CONTRAST TECHNIQUE: Multidetector CT imaging of the abdomen and pelvis was performed using the standard protocol following bolus administration of intravenous contrast. CONTRAST:  20mL OMNIPAQUE IOHEXOL 350 MG/ML SOLN COMPARISON:  CT of the chest abdomen pelvis dated 01/01/2021. FINDINGS: Lower chest: Bibasilar atelectasis and bronchiectasis. There is mild cardiomegaly. Coronary vascular calcification. No intra-abdominal free air or free fluid. Hepatobiliary: Indeterminate 12 mm hypodense lesion in the left lobe of the liver. Several additional smaller hypodense lesions in the liver are not characterized. No intrahepatic biliary ductal dilatation. Several stones in the neck of the gallbladder. There is a small pericholecystic fluid. Further evaluation with ultrasound is recommended to evaluate for possibility of acute cholecystitis. Pancreas:  Unremarkable. No pancreatic ductal dilatation or surrounding inflammatory changes. Spleen: Normal in size without focal abnormality. Adrenals/Urinary Tract: The adrenal glands are unremarkable. There is mild bilateral hydronephrosis. There is an 8 mm enhancing lesion in the left renal pelvis (64/5) concerning for a urothelial neoplasm. Further evaluation with cystoscopy is recommended. Several discrimination there is a 1 cm right renal inferior pole cyst. The visualized ureters appear unremarkable the urinary bladder is distended. There is diffuse trabeculated appearance of the bladder wall consistent with chronic bladder dysfunction. Correlation with urinalysis recommended to exclude cystitis. Stomach/Bowel: There is a large fecal content within the rectal vault. Moderate stool noted throughout the colon. There is sigmoid diverticulosis without active inflammatory changes. There is no bowel obstruction or active inflammation. The appendix is normal. Vascular/Lymphatic: Advanced aortoiliac atherosclerotic disease. IVC is unremarkable. No pain venous gas. There is no adenopathy. Reproductive: Several calcified uterine fibroids noted. Somewhat thickened appearance of the endometrium. This is not evaluated on CT. Further evaluation with pelvic ultrasound on a nonemergent/outpatient basis recommended. Other: Focal area of heterogeneity of the presacral fat similar to prior CT, nonspecific, but may  be related to sequela of chronic inflammation. Musculoskeletal: Osteopenia with degenerative changes of the spine. No acute osseous pathology. IMPRESSION: 1. Cholelithiasis with small pericholecystic fluid. Further evaluation with ultrasound is recommended to evaluate for possibility of acute cholecystitis. 2. An 8 mm enhancing lesion in the left renal pelvis most concerning for a urothelial neoplasm. Direct visualization with scope recommended. 3. Constipation with possible fecal content within the rectal vault. No bowel  obstruction. Normal appendix. 4. Sigmoid diverticulosis. 5. Mild bilateral hydronephrosis with findings of chronic bladder dysfunction. Correlation with urinalysis recommended to exclude cystitis. 6. Somewhat thickened appearance of the endometrium. Further evaluation with pelvic ultrasound on a nonemergent/outpatient basis recommended. 7. Aortic Atherosclerosis (ICD10-I70.0). Electronically Signed   By: Elgie Collard M.D.   On: 05/12/2021 01:59   DG CHEST PORT 1 VIEW  Result Date: 05/11/2021 CLINICAL DATA:  Hypotension EXAM: PORTABLE CHEST 1 VIEW COMPARISON:  09/11/2020 FINDINGS: Heart is normal size. No confluent airspace opacities or effusions. Aortic atherosclerosis. No acute bony abnormality. IMPRESSION: No active disease. Electronically Signed   By: Charlett Nose M.D.   On: 05/11/2021 23:36    Cardiac Studies   none  Patient Profile     87 y.o. 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 Randie Heinz, PAD with high-grade bilateral SFA disease and severe tibial denies noted on dopplers in 2015, bilateral carotid artery disease, prior stroke, bilateral PEs and DVTs on Xarelto, hypertension, hyperlipidemia, type 2 diabetes mellitus, glaucoma, legal blindness, and vascular dementia who is being seen 05/12/2021 for the evaluation of syncope at the request of Dr. Adela Glimpse.  Assessment & Plan    Syncope - Occurred in the setting of hypotension with BP initially of 55/41 in the field and heart rate in the 40s. Treated with IV fluids. -patient does not remember anything surrounding the event.  She told me that she was talking on the phone with someone who she cannot remember and she stopped talking and EMS was called but then after questioning her further she said it happened 2 weeks ago - EKG shows no acute changes from prior EKG.  - Initial high-sensitivity troponin negative. - chest CTA neg for acute PE - LVEF 65-70% in 12/2020>>repeat 2D echo  pending as well as carotid dopplers - Telemetry shows NSR with no arrhythmias>>continue to follow on tele -if 2D echo is normal and carotid dopplers with no significant abnormality then would get 30 day event monitor to assess for arrhythmias.  -her daughter tells me that she had given her the Losartan at 3am and then later that am she heard her mom yell out and when she went in she was going out and she took her BP and it was systolic.  It is still unclear to me if she actually passed out completely.  Apparently recently her BP has been labile. -check orthostatic BPs -given advanced age and lack of chest pain or SOB and in Palliative Care, would not pursue ischemic workup at this time    Labile BP Hypertension - BP reportedly 55/41 in the field but markedly hypertensive at times here with BP as high as the 190s/100s and IVF resuscitation.  -Bp remains borderline elevated at 159/25mmHg -will restart Losartan 25mg  daily and follow BP -her daughter was in the room today and said that she has not given her the Carvedilol in several months because she almost passed out the last time she gave it to her.  -she has had  labile BP at home   Stress Induced Cardiomyopathy - LVEF of 30-35% with wall motion abnormalities consistent with stress induced cardiomyopathy in 08/2020. Improved to 65-70% by echo 12/2020 - 2D echo pending this admission -continue ARB -was on Carvedilol but her daughter had not given her any in several months due to the last time she gave it to her she had a ? Presyncopal episode.   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 Stable on last CTA in 12/2017.   PAD - Lower extremity dopplers in 2015 showed ABIs of 0.8 bilaterally with high grade bilateral SFA disease and severe tibial vessel disease. TBIs were in the 0.5 range bilaterally as well. Has been managed conservatively. - No aspirin due to need for DOAC. - See recommendations regarding  statin below.   Carotid Artery Disease - Known total occlusion on the right with 80-99% stenosis of left ICA on dopplers in 05/2014. - Repeat dopplers pending.  - No aspirin due to need for DOAC. - See recommendations regarding statin below.   Hyperlipidemia - History of hyperlipidemia but does not appear to be on a statin.  - Will checking fasting lipid panel. - Given advanced age and over all comorbidities there may not be much utility in adding one at this point. She is followed by Palliative Care as an outpatient.   Type 2 Diabetes Mellitus - Hemolgobin A1c 4.5 this admission. - Management per primary team.   Bilateral PE/DVT - On Xarelto 20mg  daily.   Otherwise, per primary team: - Ileus - Acute lower UTI: on Rocephin - Dementia    I have spent a total of 30 minutes with patient reviewing 2D echo, Chest CTA , telemetry, EKGs, labs and examining patient as well as establishing an assessment and plan that was discussed with the patient.  > 50% of time was spent in direct patient care.     For questions or updates, please contact [facility] Please consult [email or web address] for contact info under        Signed, Armanda Magicraci Payson Crumby, MD  05/13/2021, 1:26 PM

## 2021-05-13 NOTE — Evaluation (Signed)
Occupational Therapy Evaluation Patient Details Name: Kaitlin Smith MRN: [id number] DOB: 1930-06-20 Today's Date: 05/13/2021    History of Present Illness Kaitlin Smith is a 87 y.o. female with medical history significant of CAD, diabetes mellitus type 2, hyperlipidemia, dementia, PEs , CVA  Chronic combined CHF history of COVID in 2021 hypertension peripheral vascular disease, coronary arterial disease. Patient presents after syncopal episode at home.   Clinical Impression   [name] is an 87 year old woman admitted to hospital after syncopal episode at home. On evaluation patient needed max assist to transfer to side of bed, mod x 2 to stand, take steps and return to bed. Patient abel to feed herself after set up and exhibits the physical abilities to assist minimally with all other ADLS. However, patient's niece reports she does not assist even though she could. Patient appears to be near her baseline. Patient's BP monitored and negative for orthostatic hypotension. Patient has no OT need at this time. Patient's niece does request a CNA at discharge if possible.    Follow Up Recommendations  No OT follow up    Equipment Recommendations  None recommended by OT    Recommendations for Other Services       Precautions / Restrictions Precautions Precautions: Fall Restrictions Weight Bearing Restrictions: No      Mobility Bed Mobility Overal bed mobility: Needs Assistance Bed Mobility: Supine to Sit;Sit to Supine     Supine to sit: Max assist;HOB elevated Sit to supine: Mod assist;+2 for physical assistance        Transfers Overall transfer level: Needs assistance Equipment used: Rolling walker (2 wheeled) Transfers: Sit to/from Stand Sit to Stand: Mod assist;+2 physical assistance;+2 safety/equipment;From elevated surface         General transfer comment: Mod x 2 to stand from elevated bed height. Patient not fully coming in to upright position with feet too far  forward and body posteriorly biased. With mod x 2 able to take a couple of steps toward head of bed.    Balance Overall balance assessment: Needs assistance Sitting-balance support: No upper extremity supported;Feet supported Sitting balance-Leahy Scale: Fair     Standing balance support: During functional activity Standing balance-Leahy Scale: Poor Standing balance comment: reliant on external assist.                           ADL either performed or assessed with clinical judgement   ADL Overall ADL's : At baseline                                             Vision Baseline Vision/History:  (completely blind) Ability to See in Adequate Light: 4 Severely impaired Patient Visual Report: No change from baseline       Perception     Praxis      Pertinent Vitals/Pain Pain Assessment: No/denies pain     Hand Dominance     Extremity/Trunk Assessment Upper Extremity Assessment Upper Extremity Assessment: Overall WFL for tasks assessed   Lower Extremity Assessment Lower Extremity Assessment: Defer to PT evaluation   Cervical / Trunk Assessment Cervical / Trunk Assessment: Normal   Communication Communication Communication: HOH   Cognition Arousal/Alertness: Awake/alert Behavior During Therapy: WFL for tasks assessed/performed Overall Cognitive Status: History of cognitive impairments - at baseline  General Comments       Exercises     Shoulder Instructions      Home Living Family/patient expects to be discharged to:: Private residence Living Arrangements: Other relatives (niece) Available Help at Discharge: Family;Available 24 hours/day Type of Home: House Home Access: Stairs to enter [employer] of Steps: 6 Entrance Stairs-Rails: Can reach both Home Layout: One level     Bathroom Shower/Tub: Chief Strategy Officer: Standard     Home Equipment:  Environmental consultant - 4 wheels          Prior Functioning/Environment Level of Independence: Needs assistance  Gait / Transfers Assistance Needed: non ambulatory. Niece pivots her to the chair. ADL's / Homemaking Assistance Needed: patient can feed herself otherwise is total care.            OT Problem List:        OT Treatment/Interventions:      OT Goals(Current goals can be found in the care plan section) Acute Rehab OT Goals OT Goal Formulation: All assessment and education complete, DC therapy  OT Frequency:     Barriers to D/C:            Co-evaluation PT/OT/SLP Co-Evaluation/Treatment: Yes Reason for Co-Treatment: For patient/therapist safety;Necessary to address cognition/behavior during functional activity          AM-PAC OT "6 Clicks" Daily Activity     Outcome Measure Help from another person eating meals?: A Little Help from another person taking care of personal grooming?: A Little Help from another person toileting, which includes using toliet, bedpan, or urinal?: Total Help from another person bathing (including washing, rinsing, drying)?: Total Help from another person to put on and taking off regular upper body clothing?: Total Help from another person to put on and taking off regular lower body clothing?: Total 6 Click Score: 10   End of Session Equipment Utilized During Treatment: Rolling walker;Gait belt Nurse Communication: Mobility status  Activity Tolerance: Patient tolerated treatment well Patient left: in bed;with call bell/phone within reach;with family/visitor present  OT Visit Diagnosis: Muscle weakness (generalized) (M62.81)                Time: 1355-1420 OT Time Calculation (min): 25 min Charges:  OT General Charges $OT Visit: 1 Visit OT Evaluation $OT Eval Moderate Complexity: 1 Mod  Tarrin Menn, OTR/L Acute Care Rehab Services  Office [phone] Pager: [phone]   Kelli Churn 05/13/2021, 4:16 PM

## 2021-05-13 NOTE — Progress Notes (Signed)
Carotid duplex has been completed.  Results can be found under chart review under CV PROC. 05/13/2021 4:50 PM Astra Gregg RVT, RDMS

## 2021-05-13 NOTE — Evaluation (Deleted)
Physical Therapy Evaluation Patient Details Name: Kaitlin Smith MRN: [id number] DOB: [birth year] Today's Date: 05/13/2021   History of Present Illness  Kaitlin Smith is a 87 y.o. female with medical history significant of CAD, diabetes mellitus type 2, hyperlipidemia, dementia, PEs , CVA  Chronic combined CHF history of COVID in 2021 hypertension peripheral vascular disease, coronary arterial disease. Patient presents after syncopal episode at home.  Clinical Impression  [name] is an 87 year old woman admitted to hospital after syncopal episode at home and present with functional mobility limitations 2* generalized weakness, balance deficits, blindness, and anxiety.  On evaluation patient needed max assist to transfer to side of bed, mod x 2 to stand, take steps and return to bed. Orthostatics performed with BP in supine 130/73, sit 142/65, and stand 155/96.  Pt unable to stand for three minues for final read.  Patient's niece states she plans to take pt home to care for her.  Pt would benefit from follow up HHPT and a CNA at discharge if possible.  Pt requests [facility] as she has been pleased with service in the past.    Follow Up Recommendations Home health PT;Other (comment) (CNA)    Equipment Recommendations  None recommended by PT    Recommendations for Other Services       Precautions / Restrictions Precautions Precautions: Fall Precaution Comments: Pt is completely blind Restrictions Weight Bearing Restrictions: No      Mobility  Bed Mobility Overal bed mobility: Needs Assistance Bed Mobility: Supine to Sit;Sit to Supine     Supine to sit: Max assist;HOB elevated Sit to supine: Mod assist;+2 for physical assistance   General bed mobility comments: multimodal cues and significant physical assist    Transfers Overall transfer level: Needs assistance Equipment used: Rolling walker (2 wheeled) Transfers: Sit to/from Stand Sit to Stand: Mod assist;+2  physical assistance;+2 safety/equipment;From elevated surface         General transfer comment: Mod x 2 to stand from elevated bed height. Patient not fully coming in to upright position with feet too far forward and body posteriorly biased. With mod x 2 able to take a couple of steps toward head of bed.  Ambulation/Gait Ambulation/Gait assistance: Mod assist;+2 physical assistance;+2 safety/equipment Gait Distance (Feet): 2 Feet Assistive device: Rolling walker (2 wheeled) Gait Pattern/deviations: Step-to pattern;Decreased step length - right;Decreased step length - left;Shuffle Gait velocity: decr   General Gait Details: Pt side-shuffled up side of bed only with RW and assist to correct for posterior drift and assist with weight shifts.  Stairs            Wheelchair Mobility    Modified Rankin (Stroke Patients Only)       Balance Overall balance assessment: Needs assistance Sitting-balance support: No upper extremity supported;Feet supported Sitting balance-Leahy Scale: Fair     Standing balance support: During functional activity Standing balance-Leahy Scale: Poor Standing balance comment: reliant on external assist.                             Pertinent Vitals/Pain Pain Assessment: No/denies pain    Home Living Family/patient expects to be discharged to:: Private residence Living Arrangements: Other relatives Available Help at Discharge: Family;Available 24 hours/day Type of Home: House Home Access: Stairs to enter Entrance Stairs-Rails: Can reach both Entrance Stairs-Number of Steps: 6 Home Layout: One level Home Equipment: Walker - 4 wheels Additional Comments: patient with hx dementia, unable  to provide history. per chart lives with her niece whom is her caregiver    Prior Function Level of Independence: Needs assistance   Gait / Transfers Assistance Needed: non ambulatory. Niece pivots her to the lift chair.  ADL's / Homemaking  Assistance Needed: patient can feed herself otherwise is total care.        Hand Dominance        Extremity/Trunk Assessment   Upper Extremity Assessment Upper Extremity Assessment: Overall WFL for tasks assessed    Lower Extremity Assessment Lower Extremity Assessment: Generalized weakness    Cervical / Trunk Assessment Cervical / Trunk Assessment: Normal  Communication   Communication: HOH  Cognition Arousal/Alertness: Awake/alert Behavior During Therapy: WFL for tasks assessed/performed Overall Cognitive Status: History of cognitive impairments - at baseline                                        General Comments      Exercises     Assessment/Plan    PT Assessment    PT Problem List         PT Treatment Interventions      PT Goals (Current goals can be found in the Care Plan section)  Acute Rehab PT Goals Patient Stated Goal: Niece states goal is for pt to return home PT Goal Formulation: With patient Time For Goal Achievement: 05/13/21 Potential to Achieve Goals: Fair    Frequency     Barriers to discharge        Co-evaluation PT/OT/SLP Co-Evaluation/Treatment: Yes Reason for Co-Treatment: For patient/therapist safety PT goals addressed during session: Mobility/safety with mobility         AM-PAC PT "6 Clicks" Mobility  Outcome Measure Help needed turning from your back to your side while in a flat bed without using bedrails?: A Lot Help needed moving from lying on your back to sitting on the side of a flat bed without using bedrails?: A Lot Help needed moving to and from a bed to a chair (including a wheelchair)?: A Lot Help needed standing up from a chair using your arms (e.g., wheelchair or bedside chair)?: A Lot Help needed to walk in hospital room?: Total Help needed climbing 3-5 steps with a railing? : Total 6 Click Score: 10    End of Session Equipment Utilized During Treatment: Gait belt Activity Tolerance:  Patient tolerated treatment well;Patient limited by fatigue Patient left: in bed;with call bell/phone within reach;with family/visitor present Nurse Communication: Mobility status PT Visit Diagnosis: Muscle weakness (generalized) (M62.81);Difficulty in walking, not elsewhere classified (R26.2)    Time: 9323-5573 PT Time Calculation (min) (ACUTE ONLY): 32 min   Charges:              Mauro Kaufmann PT Acute Rehabilitation Services Pager [phone] Office [phone]   Zian Mohamed 05/13/2021, 4:55 PM

## 2021-05-13 NOTE — Evaluation (Signed)
Physical Therapy Evaluation Patient Details Name: Kaitlin Smith MRN: [id number] DOB: [birth year] Today's Date: 05/13/2021   History of Present Illness  Kaitlin Smith is a 87 y.o. female with medical history significant of CAD, diabetes mellitus type 2, hyperlipidemia, dementia, PEs , CVA  Chronic combined CHF history of COVID in 2021 hypertension peripheral vascular disease, coronary arterial disease. Patient presents after syncopal episode at home.  Clinical Impression  Kaitlin Smith is an 87 year old woman admitted to hospital after syncopal episode at home and present with functional mobility limitations 2* generalized weakness, balance deficits, blindness, and anxiety.  On evaluation patient needed max assist to transfer to side of bed, mod x 2 to stand, take steps and return to bed. Orthostatics performed with BP in supine 130/73, sit 142/65, and stand 155/96.  Pt unable to stand for three minues for final read.  Patient's niece states she plans to take pt home to care for her.  Pt would benefit from follow up HHPT and a CNA at discharge if possible.  Pt requests [facility] as she has been pleased with service in the past.    Follow Up Recommendations Home health PT;Other (comment) (CNA)    Equipment Recommendations  None recommended by PT    Recommendations for Other Services       Precautions / Restrictions Precautions Precautions: Fall Precaution Comments: Pt is completely blind Restrictions Weight Bearing Restrictions: No      Mobility  Bed Mobility Overal bed mobility: Needs Assistance Bed Mobility: Supine to Sit;Sit to Supine     Supine to sit: Max assist;HOB elevated Sit to supine: Mod assist;+2 for physical assistance   General bed mobility comments: multimodal cues and significant physical assist    Transfers Overall transfer level: Needs assistance Equipment used: Rolling walker (2 wheeled) Transfers: Sit to/from Stand Sit to Stand: Mod assist;+2  physical assistance;+2 safety/equipment;From elevated surface         General transfer comment: Mod x 2 to stand from elevated bed height. Patient not fully coming in to upright position with feet too far forward and body posteriorly biased. With mod x 2 able to take a couple of steps toward head of bed.  Ambulation/Gait Ambulation/Gait assistance: Mod assist;+2 physical assistance;+2 safety/equipment Gait Distance (Feet): 2 Feet Assistive device: Rolling walker (2 wheeled) Gait Pattern/deviations: Step-to pattern;Decreased step length - right;Decreased step length - left;Shuffle Gait velocity: decr   General Gait Details: Pt side-shuffled up side of bed only with RW and assist to correct for posterior drift and assist with weight shifts.  Stairs            Wheelchair Mobility    Modified Rankin (Stroke Patients Only)       Balance Overall balance assessment: Needs assistance Sitting-balance support: No upper extremity supported;Feet supported Sitting balance-Leahy Scale: Fair     Standing balance support: During functional activity Standing balance-Leahy Scale: Poor Standing balance comment: reliant on external assist.                             Pertinent Vitals/Pain Pain Assessment: No/denies pain    Home Living Family/patient expects to be discharged to:: Private residence Living Arrangements: Other relatives Available Help at Discharge: Family;Available 24 hours/day Type of Home: House Home Access: Stairs to enter Entrance Stairs-Rails: Can reach both Entrance Stairs-Number of Steps: 6 Home Layout: One level Home Equipment: Walker - 4 wheels Additional Comments: patient with hx dementia, unable  to provide history. per chart lives with her niece whom is her caregiver    Prior Function Level of Independence: Needs assistance   Gait / Transfers Assistance Needed: non ambulatory. Niece pivots her to the lift chair.  ADL's / Homemaking  Assistance Needed: patient can feed herself otherwise is total care.        Hand Dominance        Extremity/Trunk Assessment   Upper Extremity Assessment Upper Extremity Assessment: Overall WFL for tasks assessed    Lower Extremity Assessment Lower Extremity Assessment: Generalized weakness    Cervical / Trunk Assessment Cervical / Trunk Assessment: Normal  Communication   Communication: HOH  Cognition Arousal/Alertness: Awake/alert Behavior During Therapy: WFL for tasks assessed/performed Overall Cognitive Status: History of cognitive impairments - at baseline                                        General Comments      Exercises     Assessment/Plan    PT Assessment Patient needs continued PT services  PT Problem List Decreased strength;Decreased activity tolerance;Decreased balance;Decreased mobility;Decreased cognition;Decreased knowledge of use of DME       PT Treatment Interventions DME instruction;Gait training;Functional mobility training;Therapeutic activities;Therapeutic exercise;Balance training;Cognitive remediation;Patient/family education    PT Goals (Current goals can be found in the Care Plan section)  Acute Rehab PT Goals Patient Stated Goal: Niece states goal is for pt to return home PT Goal Formulation: With patient Time For Goal Achievement: 05/13/21 Potential to Achieve Goals: Fair    Frequency Min 3X/week   Barriers to discharge        Co-evaluation PT/OT/SLP Co-Evaluation/Treatment: Yes Reason for Co-Treatment: For patient/therapist safety PT goals addressed during session: Mobility/safety with mobility         AM-PAC PT "6 Clicks" Mobility  Outcome Measure Help needed turning from your back to your side while in a flat bed without using bedrails?: A Lot Help needed moving from lying on your back to sitting on the side of a flat bed without using bedrails?: A Lot Help needed moving to and from a bed to a chair  (including a wheelchair)?: A Lot Help needed standing up from a chair using your arms (e.g., wheelchair or bedside chair)?: A Lot Help needed to walk in hospital room?: Total Help needed climbing 3-5 steps with a railing? : Total 6 Click Score: 10    End of Session Equipment Utilized During Treatment: Gait belt Activity Tolerance: Patient tolerated treatment well;Patient limited by fatigue Patient left: in bed;with call bell/phone within reach;with family/visitor present Nurse Communication: Mobility status PT Visit Diagnosis: Muscle weakness (generalized) (M62.81);Difficulty in walking, not elsewhere classified (R26.2)    Time: 3335-4562 PT Time Calculation (min) (ACUTE ONLY): 32 min   Charges:   PT Evaluation $PT Eval Moderate Complexity: 1 Mod          Mauro Kaufmann PT Acute Rehabilitation Services Pager [phone] Office [phone]   [facility] 05/13/2021, 5:38 PM

## 2021-05-14 ENCOUNTER — Other Ambulatory Visit: Payer: Self-pay | Admitting: Physician Assistant

## 2021-05-14 DIAGNOSIS — N390 Urinary tract infection, site not specified: Secondary | ICD-10-CM | POA: Diagnosis not present

## 2021-05-14 DIAGNOSIS — R55 Syncope and collapse: Secondary | ICD-10-CM

## 2021-05-14 DIAGNOSIS — E7800 Pure hypercholesterolemia, unspecified: Secondary | ICD-10-CM | POA: Diagnosis not present

## 2021-05-14 DIAGNOSIS — E860 Dehydration: Secondary | ICD-10-CM | POA: Diagnosis not present

## 2021-05-14 DIAGNOSIS — I10 Essential (primary) hypertension: Secondary | ICD-10-CM | POA: Diagnosis not present

## 2021-05-14 DIAGNOSIS — I5042 Chronic combined systolic (congestive) and diastolic (congestive) heart failure: Secondary | ICD-10-CM | POA: Diagnosis not present

## 2021-05-14 LAB — GLUCOSE, CAPILLARY
Glucose-Capillary: 104 mg/dL — ABNORMAL HIGH (ref 70–99)
Glucose-Capillary: 110 mg/dL — ABNORMAL HIGH (ref 70–99)
Glucose-Capillary: 112 mg/dL — ABNORMAL HIGH (ref 70–99)
Glucose-Capillary: 89 mg/dL (ref 70–99)
Glucose-Capillary: 94 mg/dL (ref 70–99)
Glucose-Capillary: 99 mg/dL (ref 70–99)

## 2021-05-14 MED ORDER — RIVAROXABAN 20 MG PO TABS: 20.0000 mg | ORAL_TABLET | Freq: Every day | ORAL | 0 refills | Status: AC

## 2021-05-14 MED ORDER — LOSARTAN POTASSIUM 25 MG PO TABS: 25.0000 mg | ORAL_TABLET | Freq: Every day | ORAL | 0 refills | Status: AC

## 2021-05-14 MED ORDER — SENNA 8.6 MG PO TABS: 1.0000 | ORAL_TABLET | Freq: Every day | ORAL | 0 refills | Status: AC

## 2021-05-14 NOTE — Progress Notes (Signed)
Progress Note  Patient Name: Natavia Sublette Date of Encounter: 05/14/2021  [facility] Cardiologist: Nanetta Batty, MD   Subjective   No further dizziness or syncope.  No CP or SOB.  Chest CTA showed no PE and pulmonary fibrosis.  Inpatient Medications    Scheduled Meds:  brinzolamide  1 drop Both Eyes TID   And   brimonidine  1 drop Both Eyes TID   insulin aspart  0-9 Units Subcutaneous Q4H   losartan  25 mg Oral Daily   pantoprazole  40 mg Oral Daily   rivaroxaban  20 mg Oral Daily   senna  1 tablet Oral Daily   tamsulosin  0.4 mg Oral Daily   Continuous Infusions:   PRN Meds: acetaminophen **OR** acetaminophen, haloperidol lactate   Vital Signs    Vitals:   05/13/21 1401 05/13/21 1402 05/13/21 2042 05/14/21 0429  BP: (!) 142/65 (!) 155/96 (!) 90/49 (!) 160/74  Pulse:   82 92  Resp:   16 16  Temp:   97.7 F (36.5 C) 97.6 F (36.4 C)  TempSrc:   Oral Oral  SpO2:   99% 96%  Weight:      Height:        Intake/Output Summary (Last 24 hours) at 05/14/2021 0930 Last data filed at 05/14/2021 5956 Gross per 24 hour  Intake 840 ml  Output 300 ml  Net 540 ml    Last 3 Weights 05/11/2021 01/01/2021 09/13/2020  Weight (lbs) 147 lb 0.8 oz 147 lb 133 lb 6.1 oz  Weight (kg) 66.7 kg 66.679 kg 60.5 kg      Telemetry    NSR - Personally Reviewed  ECG    No new EKG to review - Personally Reviewed  Physical Exam   GEN: Well nourished, well developed in no acute distress HEENT: Normal NECK: No JVD; No carotid bruits LYMPHATICS: No lymphadenopathy CARDIAC:RRR, no murmurs, rubs, gallops RESPIRATORY:  Clear to auscultation without rales, wheezing or rhonchi  ABDOMEN: Soft, non-tender, non-distended MUSCULOSKELETAL:  No edema; No deformity  SKIN: Warm and dry NEUROLOGIC:  Alert and oriented x 3 PSYCHIATRIC:  Normal affect   Labs    High Sensitivity Troponin:   Recent Labs  Lab 05/11/21 2224  TROPONINIHS 10       Chemistry Recent Labs  Lab  05/11/21 1905 05/12/21 0420  NA 141 139  K 5.1 4.7  CL 106 105  CO2 29 28  GLUCOSE 98 99  BUN 7* 8  CREATININE 0.43* 0.65  CALCIUM 9.3 9.0  PROT 6.1* 6.0*  ALBUMIN 3.3* 3.2*  AST 16 14*  ALT 12 11  ALKPHOS 67 73  BILITOT 0.7 0.6  GFRNONAA >60 >60  ANIONGAP 6 6      Hematology Recent Labs  Lab 05/11/21 1905 05/12/21 0420  WBC 6.9 5.3  RBC 4.16 4.24  HGB 13.3 13.8  HCT 41.1 41.6  MCV 98.8 98.1  MCH 32.0 32.5  MCHC 32.4 33.2  RDW 12.3 12.2  PLT 216 252     BNPNo results for input(s): BNP, PROBNP in the last 168 hours.   DDimer No results for input(s): DDIMER in the last 168 hours.  Radiology    CT Angio Chest Pulmonary Embolism (PE) W or WO Contrast  Result Date: 05/12/2021 CLINICAL DATA:  Syncope, simple, normal neuro exam PE suspected, high prob EXAM: CT ANGIOGRAPHY CHEST WITH CONTRAST TECHNIQUE: Multidetector CT imaging of the chest was performed using the standard protocol during bolus administration of intravenous contrast.  Multiplanar CT image reconstructions and MIPs were obtained to evaluate the vascular anatomy. CONTRAST:  80mL OMNIPAQUE IOHEXOL 350 MG/ML SOLN COMPARISON:  April 2022 FINDINGS: Cardiovascular: Satisfactory opacification of the pulmonary arteries to the segmental level. No evidence of pulmonary embolism. Diffuse atherosclerotic calcification of the thoracic aorta. Normal heart size. No pericardial effusion. Mediastinum/Nodes: No enlarged mediastinal, hilar, or axillary lymph nodes. Multinodular thyroid gland, right-sided calcifications. Lungs/Pleura: No pleural effusion. No pneumothorax. There is volume loss with ground-glass consolidation the bilateral lobes with traction bronchiectasis. No suspicious pulmonary nodules. Musculoskeletal: No aggressive osseous lesions. Upper abdomen: Cholelithiasis. Review of the MIP images confirms the above findings. IMPRESSION: 1. No pulmonary embolism. 2. In the bilateral lower lobes, there is volume loss with  ground-glass consolidation and traction bronchiectasis. These findings are reflective of pulmonary fibrosis, and can be seen with interstitial lung diseases. 3. Multinodular thyroid gland. Typically an ultrasound would be recommended, however, in the setting of significant comorbidities or limited life expectancy, no follow-up is recommended (ref: J Am Coll Radiol. 2015 Feb;12(2): 143-50). 4. Cholelithiasis. Electronically Signed   By: Olive Bass M.D.   On: 05/12/2021 14:59   ECHOCARDIOGRAM COMPLETE  Result Date: 05/13/2021    ECHOCARDIOGRAM REPORT   Patient Name:   BRAELEE HERRLE Date of Exam: 05/13/2021 Medical Rec #:  [id number]    Height:       65.0 in Accession #:    [id number]   Weight:       147.0 lb Date of Birth:  July 26, 1930    BSA:          1.736 m Patient Age:    87 years     BP:           159/73 mmHg Patient Gender: F            HR:           86 bpm. Exam Location:  Inpatient Procedure: 2D Echo, Cardiac Doppler and Color Doppler STAT ECHO Indications:    Pulmonary embolus                 Syncope  History:        Patient has prior history of Echocardiogram examinations, most                 recent 01/03/2021. CHF, Previous Myocardial Infarction, Abnormal                 ECG, Stroke, Signs/Symptoms:Syncope; Risk Factors:Diabetes,                 Hypertension and Dyslipidemia.  Sonographer:    Neomia Dear RDCS Referring Phys: 14 Jamael Hoffmann R Shera Laubach IMPRESSIONS  1. Hyperdynamic LV function; moderate LVH with proximal septal thickening.  2. Left ventricular ejection fraction, by estimation, is >75%. The left ventricle has hyperdynamic function. The left ventricle has no regional wall motion abnormalities. There is moderate left ventricular hypertrophy. Left ventricular diastolic parameters are consistent with Grade I diastolic dysfunction (impaired relaxation).  3. Right ventricular systolic function is mildly reduced. The right ventricular size is normal.  4. The mitral valve is normal in structure. No  evidence of mitral valve regurgitation. No evidence of mitral stenosis.  5. The aortic valve is tricuspid. Aortic valve regurgitation is not visualized. Mild to moderate aortic valve sclerosis/calcification is present, without any evidence of aortic stenosis. FINDINGS  Left Ventricle: Left ventricular ejection fraction, by estimation, is >75%. The left ventricle has hyperdynamic function. The left ventricle has no regional wall  motion abnormalities. The left ventricular internal cavity size was normal in size. There is moderate left ventricular hypertrophy. Left ventricular diastolic parameters are consistent with Grade I diastolic dysfunction (impaired relaxation). Right Ventricle: The right ventricular size is normal. Right ventricular systolic function is mildly reduced. Left Atrium: Left atrial size was normal in size. Right Atrium: Right atrial size was normal in size. Pericardium: Trivial pericardial effusion is present. Mitral Valve: The mitral valve is normal in structure. Mild mitral annular calcification. No evidence of mitral valve regurgitation. No evidence of mitral valve stenosis. Tricuspid Valve: The tricuspid valve is normal in structure. Tricuspid valve regurgitation is not demonstrated. No evidence of tricuspid stenosis. Aortic Valve: The aortic valve is tricuspid. Aortic valve regurgitation is not visualized. Mild to moderate aortic valve sclerosis/calcification is present, without any evidence of aortic stenosis. Aortic valve mean gradient measures 3.0 mmHg. Aortic valve peak gradient measures 5.3 mmHg. Aortic valve area, by VTI measures 2.73 cm. Pulmonic Valve: The pulmonic valve was not well visualized. Pulmonic valve regurgitation is not visualized. No evidence of pulmonic stenosis. Aorta: The aortic root is normal in size and structure. Venous: The inferior vena cava was not well visualized. IAS/Shunts: The interatrial septum was not well visualized. Additional Comments: Hyperdynamic LV  function; moderate LVH with proximal septal thickening.  LEFT VENTRICLE PLAX 2D LVIDd:         3.10 cm     Diastology LVIDs:         2.50 cm     LV e' medial:    3.81 cm/s LV PW:         1.90 cm     LV E/e' medial:  14.8 LV IVS:        2.10 cm     LV e' lateral:   6.85 cm/s LVOT diam:     2.00 cm     LV E/e' lateral: 8.2 LV SV:         41 LV SV Index:   24 LVOT Area:     3.14 cm  LV Volumes (MOD) LV vol d, MOD A2C: 21.1 ml LV vol d, MOD A4C: 31.5 ml LV vol s, MOD A2C: 4.9 ml LV vol s, MOD A4C: 9.4 ml LV SV MOD A2C:     16.2 ml LV SV MOD A4C:     31.5 ml LV SV MOD BP:      22.5 ml RIGHT VENTRICLE RV Basal diam:  2.00 cm RV Mid diam:    1.80 cm RV S prime:     12.10 cm/s TAPSE (M-mode): 1.1 cm LEFT ATRIUM             Index      RIGHT ATRIUM          Index LA Vol (A2C):   16.0 ml 9.22 ml/m RA Area:     6.98 cm LA Vol (A4C):   13.9 ml 8.01 ml/m RA Volume:   12.10 ml 6.97 ml/m LA Biplane Vol: 15.7 ml 9.05 ml/m  AORTIC VALVE AV Area (Vmax):    2.32 cm AV Area (Vmean):   2.24 cm AV Area (VTI):     2.73 cm AV Vmax:           115.00 cm/s AV Vmean:          76.600 cm/s AV VTI:            0.151 m AV Peak Grad:      5.3 mmHg AV Mean Grad:  3.0 mmHg LVOT Vmax:         84.80 cm/s LVOT Vmean:        54.700 cm/s LVOT VTI:          0.131 m LVOT/AV VTI ratio: 0.87  AORTA Ao Root diam: 2.90 cm MITRAL VALVE MV Area (PHT): 3.77 cm    SHUNTS MV Decel Time: 201 msec    Systemic VTI:  0.13 m MV E velocity: 56.30 cm/s  Systemic Diam: 2.00 cm MV A velocity: 82.00 cm/s MV E/A ratio:  0.69 Olga Millers MD Electronically signed by Olga Millers MD Signature Date/Time: 05/13/2021/3:04:16 PM    Final    VAS US CAROTID  Result Date: 05/13/2021 Carotid Arterial Duplex Study Patient Name:  IGNACIA GENTZLER  Date of Exam:   05/13/2021 Medical Rec #: [id number]     Accession #:    [id number] Date of Birth: 09-07-1930     Patient Gender: F Patient Age:   61 years Exam Location:  [facility] Procedure:      VAS US CAROTID Referring  Phys: Gloris Manchester Halo Shevlin --------------------------------------------------------------------------------  Indications:       Syncope. Risk Factors:      Hypertension, hyperlipidemia, Diabetes, past history of                    smoking, prior MI, coronary artery disease, prior CVA, PAD. Other Factors:     CHF, AO dissection, Carotid stenosis/occlusion. Limitations        Today's exam was limited due to the body habitus of the                    patient, heavy calcification and the resulting shadowing and                    high bifurcation. Comparison Study:  Previous exam 05/13/2014 - RT ICA occlusion & LT ICA 80-99% -                    no history of intervention Performing Technologist: Jody Hill RVT, RDMS  Examination Guidelines: A complete evaluation includes B-mode imaging, spectral Doppler, color Doppler, and power Doppler as needed of all accessible portions of each vessel. Bilateral testing is considered an integral part of a complete examination. Limited examinations for reoccurring indications may be performed as noted.  Right Carotid Findings: +----------+--------+--------+--------+---------------------+------------------+           PSV cm/sEDV cm/sStenosisPlaque Description   Comments           +----------+--------+--------+--------+---------------------+------------------+ CCA Prox  72      0                                    intimal thickening +----------+--------+--------+--------+---------------------+------------------+ CCA Distal65      0               homogeneous and      intimal thickening                                   smooth                                  +----------+--------+--------+--------+---------------------+------------------+ ICA Prox  Occluded                                        +----------+--------+--------+--------+---------------------+------------------+ ICA Distal                                             Not  visualized     +----------+--------+--------+--------+---------------------+------------------+ ECA       45      0               calcific             Not well                                                                  visualized         +----------+--------+--------+--------+---------------------+------------------+ +----------+--------+-------+----------------+-------------------+           PSV cm/sEDV cmsDescribe        Arm Pressure (mmHG) +----------+--------+-------+----------------+-------------------+ [id number]            Multiphasic, WNL                    +----------+--------+-------+----------------+-------------------+ +---------+--------+--+--------+-+---------+ VertebralPSV cm/s27EDV cm/s5Antegrade +---------+--------+--+--------+-+---------+ Very high bifurcation - difficult to visualized ICA/ECA Left Carotid Findings: +----------+--------+--------+--------+-------------------+--------------------+           PSV cm/sEDV cm/sStenosisPlaque Description Comments             +----------+--------+--------+--------+-------------------+--------------------+ CCA Prox  71      16              calcific and smoothintimal thickening   +----------+--------+--------+--------+-------------------+--------------------+ CCA Distal79      12              calcific and smoothintimal thickening   +----------+--------+--------+--------+-------------------+--------------------+ ICA Prox  393     127     80-99%  irregular and      Shadowing                                              calcific                                +----------+--------+--------+--------+-------------------+--------------------+ ICA Mid   66      17                                                      +----------+--------+--------+--------+-------------------+--------------------+ ICA Distal61      20                                                       +----------+--------+--------+--------+-------------------+--------------------+  ECA       50      9                                  Difficult to                                                              visualized -                                                              shadowing            +----------+--------+--------+--------+-------------------+--------------------+ +----------+--------+--------+----------------+-------------------+           PSV cm/sEDV cm/sDescribe        Arm Pressure (mmHG) +----------+--------+--------+----------------+-------------------+ [id number]              Multiphasic, WNL                    +----------+--------+--------+----------------+-------------------+ +---------+--------+--+--------+--+---------+ VertebralPSV cm/s35EDV cm/s11Antegrade +---------+--------+--+--------+--+---------+   Summary: Right Carotid: Evidence consistent with a total occlusion of the right ICA.                Non-hemodynamically significant plaque <50% noted in the CCA. The                ECA appears <50% stenosed. Left Carotid: Velocities in the left ICA are consistent with a 80-99% stenosis.               Non-hemodynamically significant plaque <50% noted in the CCA. The               ECA appears <50% stenosed. Vertebrals:  Bilateral vertebral arteries demonstrate antegrade flow. Subclavians: Normal flow hemodynamics were seen in bilateral subclavian              arteries. *See table(s) above for measurements and observations.  Electronically signed by Heath Lark on 05/13/2021 at 5:59:12 PM.    Final     Cardiac Studies   none  Patient Profile     87 y.o. 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 Randie Heinz, PAD with high-grade bilateral SFA disease and severe tibial denies noted on dopplers in 2015, bilateral carotid artery disease, prior stroke, bilateral PEs and DVTs on Xarelto,  hypertension, hyperlipidemia, type 2 diabetes mellitus, glaucoma, legal blindness, and vascular dementia who is being seen 05/12/2021 for the evaluation of syncope at the request of Dr. Adela Glimpse.  Assessment & Plan    Syncope - Occurred in the setting of hypotension with BP initially of 55/41 in the field and heart rate in the 40s. Treated with IV fluids. -patient does not remember anything surrounding the event.  -her daughter tells me that she had given her the Losartan at 3am and then later that am she heard her mom yell out and when she went in she was going out and she took her BP and it was systolic.  It is still unclear to  me if she actually passed out completely.  Apparently recently her BP has been labile.  - EKG shows no acute changes from prior EKG.  - Initial high-sensitivity troponin negative. - chest CTA neg for acute PE - LVEF 65-70% in 12/2020>>repeat 2D echo this admit with hyperdynamic LVF with EF>75% and moderate LVH, mild RV dysfunction - carotid dopplers showed occluded RICA and 80-90% left ICA stenosis>>unchanged from prior - Telemetry shows NSR with no arrhythmias>>continue to follow on tele - orthostatic BPs pending -suspect that etiology of patient's presyncope/syncope is severe carotid artery disease -given advanced age and lack of chest pain or SOB and in Palliative Care, would not pursue ischemic workup at this time for CAD.   -given that patient is DNR and in Palliative Care at home would recommend not pursuing carotid revascularization (dopplers unchanged from 2015) -will get outpt 30 day event monitor   Labile BP Hypertension - BP reportedly 55/41 in the field but markedly hypertensive at times here with BP as high as the 190s/100s and IVF resuscitation.  -Bp remains borderline elevated at 159/41mmHg -would not be aggressive with BP control in presence of severe carotid disease -her daughter was in the room today and said that she has not given her the  Carvedilol in several months because she almost passed out the last time she gave it to her.  -she has had labile BP at home   Stress Induced Cardiomyopathy - LVEF of 30-35% with wall motion abnormalities consistent with stress induced cardiomyopathy in 08/2020. Improved to 65-70% by echo 12/2020 - 2D echo this admission with hyperdynamic LVF -continue ARB -was on Carvedilol but her daughter had not given her any in several months due to the last time she gave it to her she had a ? Presyncopal episode.   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 Stable on last CTA in 12/2017.   PAD - Lower extremity dopplers in 2015 showed ABIs of 0.8 bilaterally with high grade bilateral SFA disease and severe tibial vessel disease. TBIs were in the 0.5 range bilaterally as well. Has been managed conservatively. - No aspirin due to need for DOAC. - See recommendations regarding statin below.   Carotid Artery Disease - Known total occlusion on the right with 80-99% stenosis of left ICA on dopplers in 05/2014. - Repeat dopplers unchanged this admit - No aspirin due to need for DOAC. - See recommendations regarding statin below.   Hyperlipidemia - History of hyperlipidemia but does not appear to be on a statin.  - Given advanced age and over all comorbidities there may not be much utility in adding one at this point. She is followed by Palliative Care as an outpatient.   Type 2 Diabetes Mellitus - Hemolgobin A1c 4.5 this admission. - Management per primary team.   Bilateral PE/DVT - On Xarelto  daily.   Otherwise, per primary team: - Ileus - Acute lower UTI: on Rocephin - Dementia  [facility] will sign off.   Medication Recommendations:  Losartan  daily Other recommendations (labs, testing, etc): event monitor  Follow up as an outpatient:  Dr. Allyson Sabal 1-2 weeks    I have spent a total of 30 minutes with patient reviewing 2D echo, carotid dopplers,  Chest CTA , telemetry, EKGs, labs and examining patient as well as establishing an assessment and plan that was discussed with the patient.  > 50% of time was spent in direct patient care.     For questions or  updates, please contact [facility] Please consult [email or web address] for contact info under        Signed, Armanda Magic, MD  05/14/2021, 9:30 AM

## 2021-05-14 NOTE — Progress Notes (Signed)
Dr. Mayford Knife requesting 30 day monitor for syncope

## 2021-05-14 NOTE — Progress Notes (Signed)
PROGRESS NOTE    Kaitlin Smith  HQI:[id number] DOB: 04-07-1930 DOA: 05/11/2021 PCP: Alysia Penna, MD    Brief Narrative:  87 y.o. female with medical history significant of CAD, diabetes mellitus type 2, hyperlipidemia, dementia, PEs , CVA. Chronic combined CHF history of COVID in 2021 hypertension peripheral vascular disease coronary arterial disease, dementia. Pt presented with syncope with labile blood pressures on presentation  Assessment & Plan:   Active Problems:   DM2 (diabetes mellitus, type 2) (HCC)   History of CVA (cerebrovascular accident)   Essential hypertension   Hyperlipidemia   Acute lower UTI   Dehydration   Syncope   Chronic combined systolic (congestive) and diastolic (congestive) heart failure (HCC)   Ileus (HCC)   Syncope  -Noted to be in the setting of transient hypotension and bradycardia unclear etiology patient not endorsing chest pain shortness of breath at this time.   -Cardiology is following. -2d echo with EF>75% -B carotid dopplers reviewed, findings of total occlusion on R and 80-99% on L, which is unchanged from similar study in 2015, reviewed. Hx dementia, advanced age, doubt pt would be an ideal candidate for aggressive revascularization. Would discuss with family -BP noted to be labile per Cardiology, now on stable regimen and Cardiology has since signed off with recommendation for outpt 30 day monitor   Ileus - -KUB worrisome for ileus. -Follow-up CT abdomen personally reviewed.  Findings notable for retained fecal material and constipation -Recommend continued bowel regimen    Hyperlipidemia  -Noted to be not on statin    Essential hypertension -  -BP meds gradually being resumed per Cardiology -Pt noted to have labile BP    Acute lower UTI - -  -Continued with Rocephin  -Urine culture reviewed, inconclusive findings    Chronic combined systolic (congestive) and diastolic (congestive) heart failure (HCC) gently rehydrate   Hx of  PE - continue xarelto   Dm2 -continue sliding scale insulin     Dementia chronic- monitor for signs of sun downing -Mildly confused and agitated this morning  Left renal pelvis lesion -Personally reviewed on abdominal CT.  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  Recommendation to focus on treating constipation and distended bladder with plan for follow-up following resolution of above to reassess as outpatient   DVT prophylaxis: Xarelto Code Status: DNR Family Communication: Pt in room, family not at bedside  Status is: Inpatient  Dispo: The patient is from: Home              Anticipated d/c is to: Home              Patient currently is medically stable to d/c.   Difficult to place patient No       Consultants:  Cardiology  Procedures:    Antimicrobials: Anti-infectives (From admission, onward)    Start     Dose/Rate Route Frequency Ordered Stop   05/11/21 2230  cefTRIAXone (ROCEPHIN) 2 g in sodium chloride 0.9 % 100 mL IVPB        2 g 200 mL/hr over 30 Minutes Intravenous Every 24 hours 05/11/21 2224 05/13/21 2200       Subjective: Eager to go home  Objective: Vitals:   05/13/21 1402 05/13/21 2042 05/14/21 0429 05/14/21 1348  BP: (!) 155/96 (!) 90/49 (!) 160/74 (!) 141/71  Pulse:  82 92 71  Resp:  16 16 14   Temp:  97.7 F (36.5 C) 97.6 F (36.4 C)  97.6 F (36.4 C)  TempSrc:  Oral Oral Oral  SpO2:  99% 96% 100%  Weight:      Height:        Intake/Output Summary (Last 24 hours) at 05/14/2021 1745 Last data filed at 05/14/2021 1236 Gross per 24 hour  Intake 780 ml  Output 300 ml  Net 480 ml    Filed Weights   05/11/21 1654  Weight: 66.7 kg    Examination: General exam: Conversant, in no acute distress Respiratory system: normal chest rise, clear, no audible wheezing Cardiovascular system: regular rhythm, s1-s2 Gastrointestinal system: Nondistended, nontender, pos BS Central  nervous system: No seizures, no tremors Extremities: No cyanosis, no joint deformities Skin: No rashes, no pallor Psychiatry: Affect normal // no auditory hallucinations   Data Reviewed: I have personally reviewed following labs and imaging studies  CBC: Recent Labs  Lab 05/11/21 1905 05/12/21 0420  WBC 6.9 5.3  NEUTROABS 5.5 3.3  HGB 13.3 13.8  HCT 41.1 41.6  MCV 98.8 98.1  PLT 216 252    Basic Metabolic Panel: Recent Labs  Lab 05/11/21 1905 05/12/21 0420  NA 141 139  K 5.1 4.7  CL 106 105  CO2 29 28  GLUCOSE 98 99  BUN 7* 8  CREATININE 0.43* 0.65  CALCIUM 9.3 9.0  MG  --  1.9  PHOS  --  3.4    GFR: Estimated Creatinine Clearance: 42.9 mL/min (by C-G formula based on SCr of 0.65 mg/dL). Liver Function Tests: Recent Labs  Lab 05/11/21 1905 05/12/21 0420  AST 16 14*  ALT 12 11  ALKPHOS 67 73  BILITOT 0.7 0.6  PROT 6.1* 6.0*  ALBUMIN 3.3* 3.2*    No results for input(s): LIPASE, AMYLASE in the last 168 hours. No results for input(s): AMMONIA in the last 168 hours. Coagulation Profile: Recent Labs  Lab 05/11/21 1905  INR 1.9*    Cardiac Enzymes: No results for input(s): CKTOTAL, CKMB, CKMBINDEX, TROPONINI in the last 168 hours. BNP (last 3 results) No results for input(s): PROBNP in the last 8760 hours. HbA1C: Recent Labs    05/11/21 2258  HGBA1C 4.5*    CBG: Recent Labs  Lab 05/13/21 2044 05/14/21 0011 05/14/21 0432 05/14/21 0727 05/14/21 1150  GLUCAP 129* 104* 94 112* 89    Lipid Profile: No results for input(s): CHOL, HDL, LDLCALC, TRIG, CHOLHDL, LDLDIRECT in the last 72 hours. Thyroid Function Tests: Recent Labs    05/12/21 0420  TSH 0.688    Anemia Panel: No results for input(s): VITAMINB12, FOLATE, FERRITIN, TIBC, IRON, RETICCTPCT in the last 72 hours. Sepsis Labs: Recent Labs  Lab 05/11/21 2224 05/11/21 2257  PROCALCITON <0.10  --   LATICACIDVEN  --  1.0     Recent Results (from the past 240 hour(s))  Urine  Culture     Status: Abnormal   Collection Time: 05/11/21 10:05 PM   Specimen: Urine, Clean Catch  Result Value Ref Range Status   Specimen Description   Final    URINE, CLEAN CATCH Performed at [facility], [address] [place], [place] [address]    Special Requests   Final    NONE Performed at [facility], [address] [place], [place] [address]    Culture MULTIPLE SPECIES PRESENT, SUGGEST RECOLLECTION (A)  Final   Report Status 05/13/2021 FINAL  Final  Culture, blood (x 2)     Status: None (Preliminary result)   Collection Time: 05/11/21 10:57 PM  Specimen: BLOOD  Result Value Ref Range Status   Specimen Description   Final    BLOOD BLOOD RIGHT HAND Performed at [facility], [address] [place], [place] [address]    Special Requests   Final    BOTTLES DRAWN AEROBIC AND ANAEROBIC Blood Culture adequate volume Performed at [facility], [address] [place], [place] [address]    Culture   Final    NO GROWTH 2 DAYS Performed at [facility], [address] [place], [place] [address]    Report Status PENDING  Incomplete  SARS CORONAVIRUS 2 (TAT 6-24 HRS) Nasopharyngeal Nasopharyngeal Swab     Status: None   Collection Time: 05/11/21 10:57 PM   Specimen: Nasopharyngeal Swab  Result Value Ref Range Status   SARS Coronavirus 2 NEGATIVE NEGATIVE Final    Comment: (NOTE) SARS-CoV-2 target nucleic acids are NOT DETECTED.  The SARS-CoV-2 RNA is generally detectable in upper and lower respiratory specimens during the acute phase of infection. Negative results do not preclude SARS-CoV-2 infection, do not rule out co-infections with other pathogens, and should not be used as the sole basis for treatment or other patient management decisions. Negative results must be combined with clinical observations, patient history, and epidemiological information. The expected result is  Negative.  Fact Sheet for Patients: HairSlick.nohttps://www.fda.gov/media/138098/download  Fact Sheet for Healthcare Providers: quierodirigir.comhttps://www.fda.gov/media/138095/download  This test is not yet approved or cleared by the [place] FDA and  has been authorized for detection and/or diagnosis of SARS-CoV-2 by FDA under an Emergency Use Authorization (EUA). This EUA will remain  in effect (meaning this test can be used) for the duration of the COVID-19 declaration under Se ction 564(b)(1) of the Act, 21 U.S.C. section 360bbb-3(b)(1), unless the authorization is terminated or revoked sooner.  Performed at [facility], [address] [place], [place] [address]   Culture, blood (x 2)     Status: None (Preliminary result)   Collection Time: 05/11/21 10:58 PM   Specimen: BLOOD  Result Value Ref Range Status   Specimen Description   Final    BLOOD LEFT ANTECUBITAL Performed at [facility], [address] [place], [place] [address]    Special Requests   Final    BOTTLES DRAWN AEROBIC AND ANAEROBIC Blood Culture adequate volume Performed at [facility], [address] [place], [place] [address]    Culture   Final    NO GROWTH 2 DAYS Performed at [facility], [address] [place], [place] [address]    Report Status PENDING  Incomplete      Radiology Studies: ECHOCARDIOGRAM COMPLETE  Result Date: 05/13/2021    ECHOCARDIOGRAM REPORT   Patient Name:   Ascencion DikeLANNIE Kirby Date of Exam: 05/13/2021 Medical Rec #:  [id number]    Height:       65.0 in Accession #:    [id number]   Weight:       147.0 lb Date of Birth:  03/29/1930    BSA:          1.736 m Patient Age:    89 years     BP:           159/73 mmHg Patient Gender: F            HR:           86 bpm. Exam Location:  Inpatient Procedure: 2D Echo, Cardiac Doppler and Color Doppler STAT ECHO Indications:  Pulmonary embolus                 Syncope  History:        Patient has prior history of  Echocardiogram examinations, most                 recent 01/03/2021. CHF, Previous Myocardial Infarction, Abnormal                 ECG, Stroke, Signs/Symptoms:Syncope; Risk Factors:Diabetes,                 Hypertension and Dyslipidemia.  Sonographer:    Neomia Dear RDCS Referring Phys: [id number] TRACI R TURNER IMPRESSIONS  1. Hyperdynamic LV function; moderate LVH with proximal septal thickening.  2. Left ventricular ejection fraction, by estimation, is >75%. The left ventricle has hyperdynamic function. The left ventricle has no regional wall motion abnormalities. There is moderate left ventricular hypertrophy. Left ventricular diastolic parameters are consistent with Grade I diastolic dysfunction (impaired relaxation).  3. Right ventricular systolic function is mildly reduced. The right ventricular size is normal.  4. The mitral valve is normal in structure. No evidence of mitral valve regurgitation. No evidence of mitral stenosis.  5. The aortic valve is tricuspid. Aortic valve regurgitation is not visualized. Mild to moderate aortic valve sclerosis/calcification is present, without any evidence of aortic stenosis. FINDINGS  Left Ventricle: Left ventricular ejection fraction, by estimation, is >75%. The left ventricle has hyperdynamic function. The left ventricle has no regional wall motion abnormalities. The left ventricular internal cavity size was normal in size. There is moderate left ventricular hypertrophy. Left ventricular diastolic parameters are consistent with Grade I diastolic dysfunction (impaired relaxation). Right Ventricle: The right ventricular size is normal. Right ventricular systolic function is mildly reduced. Left Atrium: Left atrial size was normal in size. Right Atrium: Right atrial size was normal in size. Pericardium: Trivial pericardial effusion is present. Mitral Valve: The mitral valve is normal in structure. Mild mitral annular calcification. No evidence of mitral valve regurgitation. No  evidence of mitral valve stenosis. Tricuspid Valve: The tricuspid valve is normal in structure. Tricuspid valve regurgitation is not demonstrated. No evidence of tricuspid stenosis. Aortic Valve: The aortic valve is tricuspid. Aortic valve regurgitation is not visualized. Mild to moderate aortic valve sclerosis/calcification is present, without any evidence of aortic stenosis. Aortic valve mean gradient measures 3.0 mmHg. Aortic valve peak gradient measures 5.3 mmHg. Aortic valve area, by VTI measures 2.73 cm. Pulmonic Valve: The pulmonic valve was not well visualized. Pulmonic valve regurgitation is not visualized. No evidence of pulmonic stenosis. Aorta: The aortic root is normal in size and structure. Venous: The inferior vena cava was not well visualized. IAS/Shunts: The interatrial septum was not well visualized. Additional Comments: Hyperdynamic LV function; moderate LVH with proximal septal thickening.  LEFT VENTRICLE PLAX 2D LVIDd:         3.10 cm     Diastology LVIDs:         2.50 cm     LV e' medial:    3.81 cm/s LV PW:         1.90 cm     LV E/e' medial:  14.8 LV IVS:        2.10 cm     LV e' lateral:   6.85 cm/s LVOT diam:     2.00 cm     LV E/e' lateral: 8.2 LV SV:         41 LV SV Index:   24  LVOT Area:     3.14 cm  LV Volumes (MOD) LV vol d, MOD A2C: 21.1 ml LV vol d, MOD A4C: 31.5 ml LV vol s, MOD A2C: 4.9 ml LV vol s, MOD A4C: 9.4 ml LV SV MOD A2C:     16.2 ml LV SV MOD A4C:     31.5 ml LV SV MOD BP:      22.5 ml RIGHT VENTRICLE RV Basal diam:  2.00 cm RV Mid diam:    1.80 cm RV S prime:     12.10 cm/s TAPSE (M-mode): 1.1 cm LEFT ATRIUM             Index      RIGHT ATRIUM          Index LA Vol (A2C):   16.0 ml 9.22 ml/m RA Area:     6.98 cm LA Vol (A4C):   13.9 ml 8.01 ml/m RA Volume:   12.10 ml 6.97 ml/m LA Biplane Vol: 15.7 ml 9.05 ml/m  AORTIC VALVE AV Area (Vmax):    2.32 cm AV Area (Vmean):   2.24 cm AV Area (VTI):     2.73 cm AV Vmax:           115.00 cm/s AV Vmean:          76.600  cm/s AV VTI:            0.151 m AV Peak Grad:      5.3 mmHg AV Mean Grad:      3.0 mmHg LVOT Vmax:         84.80 cm/s LVOT Vmean:        54.700 cm/s LVOT VTI:          0.131 m LVOT/AV VTI ratio: 0.87  AORTA Ao Root diam: 2.90 cm MITRAL VALVE MV Area (PHT): 3.77 cm    SHUNTS MV Decel Time: 201 msec    Systemic VTI:  0.13 m MV E velocity: 56.30 cm/s  Systemic Diam: 2.00 cm MV A velocity: 82.00 cm/s MV E/A ratio:  0.69 Olga Millers MD Electronically signed by Olga Millers MD Signature Date/Time: 05/13/2021/3:04:16 PM    Final    VAS US CAROTID  Result Date: 05/13/2021 Carotid Arterial Duplex Study Patient Name:  LUDMILLA MCGILLIS  Date of Exam:   05/13/2021 Medical Rec #: [id number]     Accession #:    [id number] Date of Birth: 09-09-1930     Patient Gender: F Patient Age:   65 years [facility] Procedure:      VAS US CAROTID Referring Phys: Gloris Manchester TURNER --------------------------------------------------------------------------------  Indications:       Syncope. Risk Factors:      Hypertension, hyperlipidemia, Diabetes, past history of                    smoking, prior MI, coronary artery disease, prior CVA, PAD. Other Factors:     CHF, AO dissection, Carotid stenosis/occlusion. Limitations        Today's exam was limited due to the body habitus of the                    patient, heavy calcification and the resulting shadowing and                    high bifurcation. Comparison Study:  Previous exam 05/13/2014 - RT ICA occlusion & LT ICA 80-99% -  no history of intervention Performing Technologist: Jody Hill RVT, RDMS  Examination Guidelines: A complete evaluation includes B-mode imaging, spectral Doppler, color Doppler, and power Doppler as needed of all accessible portions of each vessel. Bilateral testing is considered an integral part of a complete examination. Limited examinations for reoccurring indications may be performed as noted.  Right Carotid Findings:  +----------+--------+--------+--------+---------------------+------------------+           PSV cm/sEDV cm/sStenosisPlaque Description   Comments           +----------+--------+--------+--------+---------------------+------------------+ CCA Prox  72      0                                    intimal thickening +----------+--------+--------+--------+---------------------+------------------+ CCA Distal65      0               homogeneous and      intimal thickening                                   smooth                                  +----------+--------+--------+--------+---------------------+------------------+ ICA Prox                  Occluded                                        +----------+--------+--------+--------+---------------------+------------------+ ICA Distal                                             Not visualized     +----------+--------+--------+--------+---------------------+------------------+ ECA       45      0               calcific             Not well                                                                  visualized         +----------+--------+--------+--------+---------------------+------------------+ +----------+--------+-------+----------------+-------------------+           PSV cm/sEDV cmsDescribe        Arm Pressure (mmHG) +----------+--------+-------+----------------+-------------------+ [id number]            Multiphasic, WNL                    +----------+--------+-------+----------------+-------------------+ +---------+--------+--+--------+-+---------+ VertebralPSV cm/s27EDV cm/s5Antegrade +---------+--------+--+--------+-+---------+ Very high bifurcation - difficult to visualized ICA/ECA Left Carotid Findings: +----------+--------+--------+--------+-------------------+--------------------+           PSV cm/sEDV cm/sStenosisPlaque Description Comments              +----------+--------+--------+--------+-------------------+--------------------+ CCA Prox  71      16              calcific and smoothintimal  thickening   +----------+--------+--------+--------+-------------------+--------------------+ CCA Distal79      12              calcific and smoothintimal thickening   +----------+--------+--------+--------+-------------------+--------------------+ ICA Prox  393     127     80-99%  irregular and      Shadowing                                              calcific                                +----------+--------+--------+--------+-------------------+--------------------+ ICA Mid   66      17                                                      +----------+--------+--------+--------+-------------------+--------------------+ ICA Distal61      20                                                      +----------+--------+--------+--------+-------------------+--------------------+ ECA       50      9                                  Difficult to                                                              visualized -                                                              shadowing            +----------+--------+--------+--------+-------------------+--------------------+ +----------+--------+--------+----------------+-------------------+           PSV cm/sEDV cm/sDescribe        Arm Pressure (mmHG) +----------+--------+--------+----------------+-------------------+ [id number]              Multiphasic, WNL                    +----------+--------+--------+----------------+-------------------+ +---------+--------+--+--------+--+---------+ VertebralPSV cm/s35EDV cm/s11Antegrade +---------+--------+--+--------+--+---------+   Summary: Right Carotid: Evidence consistent with a total occlusion of the right ICA.                Non-hemodynamically significant plaque <50% noted in the CCA. The                 ECA appears <50% stenosed. Left Carotid: Velocities in the left ICA are consistent with a 80-99% stenosis.               Non-hemodynamically significant  plaque <50% noted in the CCA. The               ECA appears <50% stenosed. Vertebrals:  Bilateral vertebral arteries demonstrate antegrade flow. Subclavians: Normal flow hemodynamics were seen in bilateral subclavian              arteries. *See table(s) above for measurements and observations.  Electronically signed by Heath Lark on 05/13/2021 at 5:59:12 PM.    Final     Scheduled Meds:  brinzolamide  1 drop Both Eyes TID   And   brimonidine  1 drop Both Eyes TID   insulin aspart  0-9 Units Subcutaneous Q4H   losartan  25 mg Oral Daily   pantoprazole  40 mg Oral Daily   rivaroxaban  20 mg Oral Daily   senna  1 tablet Oral Daily   tamsulosin  0.4 mg Oral Daily   Continuous Infusions:     LOS: 1 day   Rickey Barbara, MD [facility] Pager On Amion  If 7PM-7AM, please contact night-coverage 05/14/2021, 5:45 PM

## 2021-05-14 NOTE — Progress Notes (Signed)
Updated Niece on patients transportation status home.

## 2021-05-14 NOTE — Discharge Summary (Signed)
Physician Discharge Summary  Kaitlin Smith POE:[id number] DOB: Oct 17, 1929 DOA: 05/11/2021  PCP: Alysia Penna, MD  Admit date: 05/11/2021 Discharge date: 05/14/2021  Admitted From: Home Disposition:  Home  Recommendations for Outpatient Follow-up:  Follow up with PCP in 1-2 weeks Follow up with Cardiology as scheduled  Discharge Condition:Improved CODE STATUS:DNR Diet recommendation: Diabetic   Brief/Interim Summary: 87 y.o. female with medical history significant of CAD, diabetes mellitus type 2, hyperlipidemia, dementia, PEs , CVA. Chronic combined CHF history of COVID in 2021 hypertension peripheral vascular disease coronary arterial disease, dementia. Pt presented with syncope with labile blood pressures on presentation  Discharge Diagnoses:  Active Problems:   DM2 (diabetes mellitus, type 2) (HCC)   History of CVA (cerebrovascular accident)   Essential hypertension   Hyperlipidemia   Acute lower UTI   Dehydration   Syncope   Chronic combined systolic (congestive) and diastolic (congestive) heart failure (HCC)   Ileus (HCC)    Syncope  -Noted to be in the setting of transient hypotension and bradycardia unclear etiology patient not endorsing chest pain shortness of breath at this time.   -Cardiology is following. -2d echo with EF>75% -B carotid dopplers reviewed, findings of total occlusion on R and 80-99% on L, which is unchanged from similar study in 2015, reviewed. Hx dementia, advanced age, doubt pt would be an ideal candidate for aggressive revascularization. Would discuss with family -BP noted to be labile per Cardiology, now on stable regimen and Cardiology has since signed off with recommendation for outpt 30 day monitor   Ileus - -KUB worrisome for ileus. -Follow-up CT abdomen personally reviewed.  Findings notable for retained fecal material and constipation -Recommend continued bowel regimen    Hyperlipidemia  -Noted to be not on statin    Essential  hypertension -  -BP meds gradually being resumed per Cardiology -Pt noted to have labile BP    Acute lower UTI - -  -Continued with Rocephin  -Urine culture reviewed, inconclusive findings    Chronic combined systolic (congestive) and diastolic (congestive) heart failure (HCC) gently rehydrated   Hx of PE - continue xarelto   Dm2 -continue sliding scale insulin     Dementia chronic- monitor for signs of sun downing -stable   Left renal pelvis lesion -Personally reviewed on abdominal CT.  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  Recommendation to focus on treating constipation and distended bladder with plan for follow-up following resolution of above to reassess as outpatient    Discharge Instructions   Allergies as of 05/14/2021       Reactions   Clonidine Derivatives Other (See Comments)   Pass out; makes her blood pressure too low   Milk-related Compounds Other (See Comments)   Lactose intolerant.          Medication List     STOP taking these medications    carvedilol 12.5 MG tablet Commonly known as: COREG   Rivaroxaban Stater Pack (15 mg and 20 mg) Commonly known as: XARELTO STARTER PACK Replaced by: rivaroxaban 20 MG Tabs tablet       TAKE these medications    acetaminophen 325 MG tablet Commonly known as: TYLENOL Take 2 tablets (650 mg total) by mouth every 6 (six) hours as needed for mild pain (temp > 101.5).   losartan 25 MG tablet Commonly known as: COZAAR Take 1 tablet (25 mg total) by mouth daily. Start taking on: May 15, 2021 What changed:  medication  strength how much to take   Lumigan 0.01 % Soln Generic drug: bimatoprost Place 1 drop into both eyes at bedtime.   pantoprazole 40 MG tablet Commonly known as: PROTONIX TAKE 1 TABLET BY MOUTH EVERY DAY   rivaroxaban 20 MG Tabs tablet Commonly known as: XARELTO Take 1 tablet (20 mg total) by mouth daily. Start  taking on: May 15, 2021 Replaces: Rivaroxaban Stater Pack (15 mg and 20 mg)   senna 8.6 MG Tabs tablet Commonly known as: SENOKOT Take 1 tablet (8.6 mg total) by mouth daily. Start taking on: May 15, 2021   Simbrinza 1-0.2 % Susp Generic drug: Brinzolamide-Brimonidine Place 1 drop into both eyes daily.   tamsulosin 0.4 MG Caps capsule Commonly known as: FLOMAX Take 0.4 mg by mouth daily.   Voltaren 1 % Gel Generic drug: diclofenac Sodium APPLY 4 GRAMS TOPICALLY 2 TIMES DAILY AS NEEDED.        Follow-up Information     Heart Monitor Follow up.   Why: The heart doctor's office will be mailing you a heart monitor to your house to wear for 30 days. It will come with instructions for how to put on, but please call this number if you have any questions: [phone]. Contact information: [facility] [phone]        Ronney Asters, NP Follow up.   Specialty: Cardiology Why: [facility] [facility] - a follow-up has been arranged for you on Friday May 28, 2021 at 11:15 AM (Arrive by 11:00 AM). Verdon Cummins is one of the cardiology nurse practitioners with Dr. Allyson Sabal and his team. Contact information: [address] [place] [address] [phone]         Alysia Penna, MD Follow up.   Specialty: Internal Medicine Why: Hospital follow up Contact information: [address] [place] [address] [phone]         Runell Gess, MD .   Specialties: Cardiology, Radiology Contact information: [address] [place] [address] [phone]                Allergies  Allergen Reactions   Clonidine Derivatives Other (See Comments)    Pass out; makes her blood pressure too low   Milk-Related Compounds Other (See Comments)    Lactose intolerant.      Consultations: Cardiology Discussed with Urology  Procedures/Studies: DG Abd 1 View  Result Date: 05/12/2021 CLINICAL DATA:  Abdominal  distention EXAM: ABDOMEN - 1 VIEW COMPARISON:  None. FINDINGS: Gas and stool-filled colon with gas-filled small bowel. No small or large bowel distention suggesting likely ileus. No radiopaque stones. Degenerative changes in the spine. Prominent vascular calcifications. IMPRESSION: Gas-filled nondilated small and large bowel likely representing ileus. Electronically Signed   By: Burman Nieves M.D.   On: 05/12/2021 00:37   CT Angio Chest Pulmonary Embolism (PE) W or WO Contrast  Result Date: 05/12/2021 CLINICAL DATA:  Syncope, simple, normal neuro exam PE suspected, high prob EXAM: CT ANGIOGRAPHY CHEST WITH CONTRAST TECHNIQUE: Multidetector CT imaging of the chest was performed using the standard protocol during bolus administration of intravenous contrast. Multiplanar CT image reconstructions and MIPs were obtained to evaluate the vascular anatomy. CONTRAST:  80mL OMNIPAQUE IOHEXOL 350 MG/ML SOLN COMPARISON:  April 2022 FINDINGS: Cardiovascular: Satisfactory opacification of the pulmonary arteries to the segmental level. No evidence of pulmonary embolism. Diffuse atherosclerotic calcification of the thoracic aorta. Normal heart size. No pericardial effusion. Mediastinum/Nodes: No enlarged mediastinal, hilar, or axillary lymph nodes. Multinodular  thyroid gland, right-sided calcifications. Lungs/Pleura: No pleural effusion. No pneumothorax. There is volume loss with ground-glass consolidation the bilateral lobes with traction bronchiectasis. No suspicious pulmonary nodules. Musculoskeletal: No aggressive osseous lesions. Upper abdomen: Cholelithiasis. Review of the MIP images confirms the above findings. IMPRESSION: 1. No pulmonary embolism. 2. In the bilateral lower lobes, there is volume loss with ground-glass consolidation and traction bronchiectasis. These findings are reflective of pulmonary fibrosis, and can be seen with interstitial lung diseases. 3. Multinodular thyroid gland. Typically an ultrasound  would be recommended, however, in the setting of significant comorbidities or limited life expectancy, no follow-up is recommended (ref: J Am Coll Radiol. 2015 Feb;12(2): 143-50). 4. Cholelithiasis. Electronically Signed   By: Olive Bass M.D.   On: 05/12/2021 14:59   CT ABDOMEN PELVIS W CONTRAST  Result Date: 05/12/2021 CLINICAL DATA:  Concern for bowel obstruction. EXAM: CT ABDOMEN AND PELVIS WITH CONTRAST TECHNIQUE: Multidetector CT imaging of the abdomen and pelvis was performed using the standard protocol following bolus administration of intravenous contrast. CONTRAST:  80mL OMNIPAQUE IOHEXOL 350 MG/ML SOLN COMPARISON:  CT of the chest abdomen pelvis dated 01/01/2021. FINDINGS: Lower chest: Bibasilar atelectasis and bronchiectasis. There is mild cardiomegaly. Coronary vascular calcification. No intra-abdominal free air or free fluid. Hepatobiliary: Indeterminate 12 mm hypodense lesion in the left lobe of the liver. Several additional smaller hypodense lesions in the liver are not characterized. No intrahepatic biliary ductal dilatation. Several stones in the neck of the gallbladder. There is a small pericholecystic fluid. Further evaluation with ultrasound is recommended to evaluate for possibility of acute cholecystitis. Pancreas: Unremarkable. No pancreatic ductal dilatation or surrounding inflammatory changes. Spleen: Normal in size without focal abnormality. Adrenals/Urinary Tract: The adrenal glands are unremarkable. There is mild bilateral hydronephrosis. There is an 8 mm enhancing lesion in the left renal pelvis (64/5) concerning for a urothelial neoplasm. Further evaluation with cystoscopy is recommended. Several discrimination there is a 1 cm right renal inferior pole cyst. The visualized ureters appear unremarkable the urinary bladder is distended. There is diffuse trabeculated appearance of the bladder wall consistent with chronic bladder dysfunction. Correlation with urinalysis recommended  to exclude cystitis. Stomach/Bowel: There is a large fecal content within the rectal vault. Moderate stool noted throughout the colon. There is sigmoid diverticulosis without active inflammatory changes. There is no bowel obstruction or active inflammation. The appendix is normal. Vascular/Lymphatic: Advanced aortoiliac atherosclerotic disease. IVC is unremarkable. No pain venous gas. There is no adenopathy. Reproductive: Several calcified uterine fibroids noted. Somewhat thickened appearance of the endometrium. This is not evaluated on CT. Further evaluation with pelvic ultrasound on a nonemergent/outpatient basis recommended. Other: Focal area of heterogeneity of the presacral fat similar to prior CT, nonspecific, but may be related to sequela of chronic inflammation. Musculoskeletal: Osteopenia with degenerative changes of the spine. No acute osseous pathology. IMPRESSION: 1. Cholelithiasis with small pericholecystic fluid. Further evaluation with ultrasound is recommended to evaluate for possibility of acute cholecystitis. 2. An 8 mm enhancing lesion in the left renal pelvis most concerning for a urothelial neoplasm. Direct visualization with scope recommended. 3. Constipation with possible fecal content within the rectal vault. No bowel obstruction. Normal appendix. 4. Sigmoid diverticulosis. 5. Mild bilateral hydronephrosis with findings of chronic bladder dysfunction. Correlation with urinalysis recommended to exclude cystitis. 6. Somewhat thickened appearance of the endometrium. Further evaluation with pelvic ultrasound on a nonemergent/outpatient basis recommended. 7. Aortic Atherosclerosis (ICD10-I70.0). Electronically Signed   By: Elgie Collard M.D.   On: 05/12/2021 01:59   DG CHEST  PORT 1 VIEW  Result Date: 05/11/2021 CLINICAL DATA:  Hypotension EXAM: PORTABLE CHEST 1 VIEW COMPARISON:  09/11/2020 FINDINGS: Heart is normal size. No confluent airspace opacities or effusions. Aortic atherosclerosis.  No acute bony abnormality. IMPRESSION: No active disease. Electronically Signed   By: Charlett Nose M.D.   On: 05/11/2021 23:36   ECHOCARDIOGRAM COMPLETE  Result Date: 05/13/2021    ECHOCARDIOGRAM REPORT   Patient Name:   Kaitlin Smith Date of Exam: 05/13/2021 Medical Rec #:  [id number]    Height:       65.0 in Accession #:    [id number]   Weight:       147.0 lb Date of Birth:  [birth year]    BSA:          1.736 m Patient Age:    89 years     BP:           159/73 mmHg Patient Gender: F            HR:           86 bpm. Exam Location:  Inpatient Procedure: 2D Echo, Cardiac Doppler and Color Doppler STAT ECHO Indications:    Pulmonary embolus                 Syncope  History:        Patient has prior history of Echocardiogram examinations, most                 recent 01/03/2021. CHF, Previous Myocardial Infarction, Abnormal                 ECG, Stroke, Signs/Symptoms:Syncope; Risk Factors:Diabetes,                 Hypertension and Dyslipidemia.  Sonographer:    Neomia Dear RDCS Referring Phys: [id number] TRACI R TURNER IMPRESSIONS  1. Hyperdynamic LV function; moderate LVH with proximal septal thickening.  2. Left ventricular ejection fraction, by estimation, is >75%. The left ventricle has hyperdynamic function. The left ventricle has no regional wall motion abnormalities. There is moderate left ventricular hypertrophy. Left ventricular diastolic parameters are consistent with Grade I diastolic dysfunction (impaired relaxation).  3. Right ventricular systolic function is mildly reduced. The right ventricular size is normal.  4. The mitral valve is normal in structure. No evidence of mitral valve regurgitation. No evidence of mitral stenosis.  5. The aortic valve is tricuspid. Aortic valve regurgitation is not visualized. Mild to moderate aortic valve sclerosis/calcification is present, without any evidence of aortic stenosis. FINDINGS  Left Ventricle: Left ventricular ejection fraction, by estimation, is >75%. The left  ventricle has hyperdynamic function. The left ventricle has no regional wall motion abnormalities. The left ventricular internal cavity size was normal in size. There is moderate left ventricular hypertrophy. Left ventricular diastolic parameters are consistent with Grade I diastolic dysfunction (impaired relaxation). Right Ventricle: The right ventricular size is normal. Right ventricular systolic function is mildly reduced. Left Atrium: Left atrial size was normal in size. Right Atrium: Right atrial size was normal in size. Pericardium: Trivial pericardial effusion is present. Mitral Valve: The mitral valve is normal in structure. Mild mitral annular calcification. No evidence of mitral valve regurgitation. No evidence of mitral valve stenosis. Tricuspid Valve: The tricuspid valve is normal in structure. Tricuspid valve regurgitation is not demonstrated. No evidence of tricuspid stenosis. Aortic Valve: The aortic valve is tricuspid. Aortic valve regurgitation is not visualized. Mild to moderate aortic valve sclerosis/calcification is present, without any evidence of  aortic stenosis. Aortic valve mean gradient measures 3.0 mmHg. Aortic valve peak gradient measures 5.3 mmHg. Aortic valve area, by VTI measures 2.73 cm. Pulmonic Valve: The pulmonic valve was not well visualized. Pulmonic valve regurgitation is not visualized. No evidence of pulmonic stenosis. Aorta: The aortic root is normal in size and structure. Venous: The inferior vena cava was not well visualized. IAS/Shunts: The interatrial septum was not well visualized. Additional Comments: Hyperdynamic LV function; moderate LVH with proximal septal thickening.  LEFT VENTRICLE PLAX 2D LVIDd:         3.10 cm     Diastology LVIDs:         2.50 cm     LV e' medial:    3.81 cm/s LV PW:         1.90 cm     LV E/e' medial:  14.8 LV IVS:        2.10 cm     LV e' lateral:   6.85 cm/s LVOT diam:     2.00 cm     LV E/e' lateral: 8.2 LV SV:         41 LV SV Index:   24  LVOT Area:     3.14 cm  LV Volumes (MOD) LV vol d, MOD A2C: 21.1 ml LV vol d, MOD A4C: 31.5 ml LV vol s, MOD A2C: 4.9 ml LV vol s, MOD A4C: 9.4 ml LV SV MOD A2C:     16.2 ml LV SV MOD A4C:     31.5 ml LV SV MOD BP:      22.5 ml RIGHT VENTRICLE RV Basal diam:  2.00 cm RV Mid diam:    1.80 cm RV S prime:     12.10 cm/s TAPSE (M-mode): 1.1 cm LEFT ATRIUM             Index      RIGHT ATRIUM          Index LA Vol (A2C):   16.0 ml 9.22 ml/m RA Area:     6.98 cm LA Vol (A4C):   13.9 ml 8.01 ml/m RA Volume:   12.10 ml 6.97 ml/m LA Biplane Vol: 15.7 ml 9.05 ml/m  AORTIC VALVE AV Area (Vmax):    2.32 cm AV Area (Vmean):   2.24 cm AV Area (VTI):     2.73 cm AV Vmax:           115.00 cm/s AV Vmean:          76.600 cm/s AV VTI:            0.151 m AV Peak Grad:      5.3 mmHg AV Mean Grad:      3.0 mmHg LVOT Vmax:         84.80 cm/s LVOT Vmean:        54.700 cm/s LVOT VTI:          0.131 m LVOT/AV VTI ratio: 0.87  AORTA Ao Root diam: 2.90 cm MITRAL VALVE MV Area (PHT): 3.77 cm    SHUNTS MV Decel Time: 201 msec    Systemic VTI:  0.13 m MV E velocity: 56.30 cm/s  Systemic Diam: 2.00 cm MV A velocity: 82.00 cm/s MV E/A ratio:  0.69 Olga Millers MD Electronically signed by Olga Millers MD Signature Date/Time: 05/13/2021/3:04:16 PM    Final    VAS US CAROTID  Result Date: 05/13/2021 Carotid Arterial Duplex Study Patient Name:  Kaitlin Smith  Date of Exam:   05/13/2021 Medical  Rec #: [id number]     Accession #:    [id number] Date of Birth: May 14, 1930     Patient Gender: F Patient Age:   76 years Exam Location:  [facility] Procedure:      VAS US CAROTID Referring Phys: Gloris Manchester TURNER --------------------------------------------------------------------------------  Indications:       Syncope. Risk Factors:      Hypertension, hyperlipidemia, Diabetes, past history of                    smoking, prior MI, coronary artery disease, prior CVA, PAD. Other Factors:     CHF, AO dissection, Carotid stenosis/occlusion.  Limitations        Today's exam was limited due to the body habitus of the                    patient, heavy calcification and the resulting shadowing and                    high bifurcation. Comparison Study:  Previous exam 05/13/2014 - RT ICA occlusion & LT ICA 80-99% -                    no history of intervention Performing Technologist: Jody Hill RVT, RDMS  Examination Guidelines: A complete evaluation includes B-mode imaging, spectral Doppler, color Doppler, and power Doppler as needed of all accessible portions of each vessel. Bilateral testing is considered an integral part of a complete examination. Limited examinations for reoccurring indications may be performed as noted.  Right Carotid Findings: +----------+--------+--------+--------+---------------------+------------------+           PSV cm/sEDV cm/sStenosisPlaque Description   Comments           +----------+--------+--------+--------+---------------------+------------------+ CCA Prox  72      0                                    intimal thickening +----------+--------+--------+--------+---------------------+------------------+ CCA Distal65      0               homogeneous and      intimal thickening                                   smooth                                  +----------+--------+--------+--------+---------------------+------------------+ ICA Prox                  Occluded                                        +----------+--------+--------+--------+---------------------+------------------+ ICA Distal                                             Not visualized     +----------+--------+--------+--------+---------------------+------------------+ ECA       45      0               calcific  Not well                                                                  visualized         +----------+--------+--------+--------+---------------------+------------------+  +----------+--------+-------+----------------+-------------------+           PSV cm/sEDV cmsDescribe        Arm Pressure (mmHG) +----------+--------+-------+----------------+-------------------+ [id number]            Multiphasic, WNL                    +----------+--------+-------+----------------+-------------------+ +---------+--------+--+--------+-+---------+ VertebralPSV cm/s27EDV cm/s5Antegrade +---------+--------+--+--------+-+---------+ Very high bifurcation - difficult to visualized ICA/ECA Left Carotid Findings: +----------+--------+--------+--------+-------------------+--------------------+           PSV cm/sEDV cm/sStenosisPlaque Description Comments             +----------+--------+--------+--------+-------------------+--------------------+ CCA Prox  71      16              calcific and smoothintimal thickening   +----------+--------+--------+--------+-------------------+--------------------+ CCA Distal79      12              calcific and smoothintimal thickening   +----------+--------+--------+--------+-------------------+--------------------+ ICA Prox  393     127     80-99%  irregular and      Shadowing                                              calcific                                +----------+--------+--------+--------+-------------------+--------------------+ ICA Mid   66      17                                                      +----------+--------+--------+--------+-------------------+--------------------+ ICA Distal61      20                                                      +----------+--------+--------+--------+-------------------+--------------------+ ECA       50      9                                  Difficult to                                                              visualized -  shadowing             +----------+--------+--------+--------+-------------------+--------------------+ +----------+--------+--------+----------------+-------------------+           PSV cm/sEDV cm/sDescribe        Arm Pressure (mmHG) +----------+--------+--------+----------------+-------------------+ [id number]              Multiphasic, WNL                    +----------+--------+--------+----------------+-------------------+ +---------+--------+--+--------+--+---------+ VertebralPSV cm/s35EDV cm/s11Antegrade +---------+--------+--+--------+--+---------+   Summary: Right Carotid: Evidence consistent with a total occlusion of the right ICA.                Non-hemodynamically significant plaque <50% noted in the CCA. The                ECA appears <50% stenosed. Left Carotid: Velocities in the left ICA are consistent with a 80-99% stenosis.               Non-hemodynamically significant plaque <50% noted in the CCA. The               ECA appears <50% stenosed. Vertebrals:  Bilateral vertebral arteries demonstrate antegrade flow. Subclavians: Normal flow hemodynamics were seen in bilateral subclavian              arteries. *See table(s) above for measurements and observations.  Electronically signed by Heath Lark on 05/13/2021 at 5:59:12 PM.    Final     Subjective: Without complaints  Discharge Exam: Vitals:   05/14/21 0429 05/14/21 1348  BP: (!) 160/74 (!) 141/71  Pulse: 92 71  Resp: 16 14  Temp: 97.6 F (36.4 C) 97.6 F (36.4 C)  SpO2: 96% 100%   Vitals:   05/13/21 1402 05/13/21 2042 05/14/21 0429 05/14/21 1348  BP: (!) 155/96 (!) 90/49 (!) 160/74 (!) 141/71  Pulse:  82 92 71  Resp:  Temp:  97.7 F (36.5 C) 97.6 F (36.4 C) 97.6 F (36.4 C)  TempSrc:  Oral Oral Oral  SpO2:  99% 96% 100%  Weight:      Height:        General: Pt is alert, awake, not in acute distress Cardiovascular: RRR, S1/S2 +,  Respiratory: CTA bilaterally, no wheezing, no rhonchi Abdominal: Soft, NT, ND,  bowel sounds + Extremities: no edema, no cyanosis   The results of significant diagnostics from this hospitalization (including imaging, microbiology, ancillary and laboratory) are listed below for reference.     Microbiology: Recent Results (from the past 240 hour(s))  Urine Culture     Status: Abnormal   Collection Time: 05/11/21 10:05 PM   Specimen: Urine, Clean Catch  Result Value Ref Range Status   Specimen Description   Final    URINE, CLEAN CATCH Performed at [facility], [address], [place], [place] [address]    Special Requests   Final    NONE Performed at [facility], [address], [place], [place] [address]    Culture MULTIPLE SPECIES PRESENT, SUGGEST RECOLLECTION (A)  Final   Report Status 05/13/2021 FINAL  Final  Culture, blood (x 2)     Status: None (Preliminary result)   Collection Time: 05/11/21 10:57 PM   Specimen: BLOOD  Result Value Ref Range Status   Specimen Description   Final    BLOOD BLOOD RIGHT HAND Performed at [facility], [address] [address], [place], [place] [address]    Special Requests   Final  BOTTLES DRAWN AEROBIC AND ANAEROBIC Blood Culture adequate volume Performed at [facility], [address], [place], [place] [address]    Culture   Final    NO GROWTH 2 DAYS Performed at [facility], [address], [place], [place] [address]    Report Status PENDING  Incomplete  SARS CORONAVIRUS 2 (TAT 6-24 HRS) Nasopharyngeal Nasopharyngeal Swab     Status: None   Collection Time: 05/11/21 10:57 PM   Specimen: Nasopharyngeal Swab  Result Value Ref Range Status   SARS Coronavirus 2 NEGATIVE NEGATIVE Final    Comment: (NOTE) SARS-CoV-2 target nucleic acids are NOT DETECTED.  The SARS-CoV-2 RNA is generally detectable in upper and lower respiratory specimens during the acute phase of infection. Negative results do not preclude SARS-CoV-2 infection, do not  rule out co-infections with other pathogens, and should not be used as the sole basis for treatment or other patient management decisions. Negative results must be combined with clinical observations, patient history, and epidemiological information. The expected result is Negative.  Fact Sheet for Patients: HairSlick.no  Fact Sheet for Healthcare Providers: quierodirigir.com  This test is not yet approved or cleared by the [place] FDA and  has been authorized for detection and/or diagnosis of SARS-CoV-2 by FDA under an Emergency Use Authorization (EUA). This EUA will remain  in effect (meaning this test can be used) for the duration of the COVID-19 declaration under Se ction 564(b)(1) of the Act, 21 U.S.C. section 360bbb-3(b)(1), unless the authorization is terminated or revoked sooner.  Performed at [facility], [address] [place], [place] [address]   Culture, blood (x 2)     Status: None (Preliminary result)   Collection Time: 05/11/21 10:58 PM   Specimen: BLOOD  Result Value Ref Range Status   Specimen Description   Final    BLOOD LEFT ANTECUBITAL Performed at [facility], [address] [place], [place] [address]    Special Requests   Final    BOTTLES DRAWN AEROBIC AND ANAEROBIC Blood Culture adequate volume Performed at [facility], [address] [place], [place] [address]    Culture   Final    NO GROWTH 2 DAYS Performed at [facility], [address], [place], [place] [address]    Report Status PENDING  Incomplete     Labs: BNP (last 3 results) Recent Labs    09/08/20 2125  BNP 1,007.4*   Basic Metabolic Panel: Recent Labs  Lab 05/11/21 1905 05/12/21 0420  NA 141 139  K 5.1 4.7  CL 106 105  CO2 29 28  GLUCOSE 98 99  BUN 7* 8  CREATININE 0.43* 0.65  CALCIUM 9.3 9.0  MG  --  1.9  PHOS  --  3.4   Liver Function Tests: Recent  Labs  Lab 05/11/21 1905 05/12/21 0420  AST 16 14*  ALT 12 11  ALKPHOS 67 73  BILITOT 0.7 0.6  PROT 6.1* 6.0*  ALBUMIN 3.3* 3.2*   No results for input(s): LIPASE, AMYLASE in the last 168 hours. No results for input(s): AMMONIA in the last 168 hours. CBC: Recent Labs  Lab 05/11/21 1905 05/12/21 0420  WBC 6.9 5.3  NEUTROABS 5.5 3.3  HGB 13.3 13.8  HCT 41.1 41.6  MCV 98.8 98.1  PLT 216 252   Cardiac Enzymes: No results for input(s): CKTOTAL, CKMB, CKMBINDEX, TROPONINI in the last 168 hours. BNP: Invalid input(s): POCBNP CBG: Recent Labs  Lab  05/13/21 2044 05/14/21 0011 05/14/21 0432 05/14/21 0727 05/14/21 1150  GLUCAP 129* 104* 94 112* 89   D-Dimer No results for input(s): DDIMER in the last 72 hours. Hgb A1c Recent Labs    05/11/21 2258  HGBA1C 4.5*   Lipid Profile No results for input(s): CHOL, HDL, LDLCALC, TRIG, CHOLHDL, LDLDIRECT in the last 72 hours. Thyroid function studies Recent Labs    05/12/21 0420  TSH 0.688   Anemia work up No results for input(s): VITAMINB12, FOLATE, FERRITIN, TIBC, IRON, RETICCTPCT in the last 72 hours. Urinalysis    Component Value Date/Time   COLORURINE YELLOW 05/11/2021 2205   APPEARANCEUR CLOUDY (A) 05/11/2021 2205   LABSPEC 1.006 05/11/2021 2205   PHURINE 7.0 05/11/2021 2205   GLUCOSEU NEGATIVE 05/11/2021 2205   HGBUR MODERATE (A) 05/11/2021 2205   BILIRUBINUR NEGATIVE 05/11/2021 2205   BILIRUBINUR neg 07/18/2014 1320   KETONESUR NEGATIVE 05/11/2021 2205   PROTEINUR NEGATIVE 05/11/2021 2205   UROBILINOGEN 0.2 07/18/2014 1320   UROBILINOGEN 0.2 06/14/2014 1236   NITRITE NEGATIVE 05/11/2021 2205   LEUKOCYTESUR LARGE (A) 05/11/2021 2205   Sepsis Labs Invalid input(s): PROCALCITONIN,  WBC,  LACTICIDVEN Microbiology Recent Results (from the past 240 hour(s))  Urine Culture     Status: Abnormal   Collection Time: 05/11/21 10:05 PM   Specimen: Urine, Clean Catch  Result Value Ref Range Status   Specimen  Description   Final    URINE, CLEAN CATCH Performed at [facility] [address] [place], [place] [address]    Special Requests   Final    NONE Performed at [facility], [address] [place], [place] [address]    Culture MULTIPLE SPECIES PRESENT, SUGGEST RECOLLECTION (A)  Final   Report Status 05/13/2021 FINAL  Final  Culture, blood (x 2)     Status: None (Preliminary result)   Collection Time: 05/11/21 10:57 PM   Specimen: BLOOD  Result Value Ref Range Status   Specimen Description   Final    BLOOD BLOOD RIGHT HAND Performed at [facility], [address], [place], [place] [address]    Special Requests   Final    BOTTLES DRAWN AEROBIC AND ANAEROBIC Blood Culture adequate volume Performed at [facility], [address], [place], [place] [address]    Culture   Final    NO GROWTH 2 DAYS Performed at [facility], [address] [place], [place] [address]    Report Status PENDING  Incomplete  SARS CORONAVIRUS 2 (TAT 6-24 HRS) Nasopharyngeal Nasopharyngeal Swab     Status: None   Collection Time: 05/11/21 10:57 PM   Specimen: Nasopharyngeal Swab  Result Value Ref Range Status   SARS Coronavirus 2 NEGATIVE NEGATIVE Final    Comment: (NOTE) SARS-CoV-2 target nucleic acids are NOT DETECTED.  The SARS-CoV-2 RNA is generally detectable in upper and lower respiratory specimens during the acute phase of infection. Negative results do not preclude SARS-CoV-2 infection, do not rule out co-infections with other pathogens, and should not be used as the sole basis for treatment or other patient management decisions. Negative results must be combined with clinical observations, patient history, and epidemiological information. The expected result is Negative.  Fact Sheet for Patients: HairSlick.no  Fact Sheet for Healthcare  Providers: quierodirigir.com  This test is not yet approved or cleared by the [place] FDA and  has been authorized for detection and/or diagnosis of SARS-CoV-2 by FDA under an Emergency Use Authorization (  EUA). This EUA will remain  in effect (meaning this test can be used) for the duration of the COVID-19 declaration under Se ction 564(b)(1) of the Act, 21 U.S.C. section 360bbb-3(b)(1), unless the authorization is terminated or revoked sooner.  Performed at [facility], [address] [place], [place] [address]   Culture, blood (x 2)     Status: None (Preliminary result)   Collection Time: 05/11/21 10:58 PM   Specimen: BLOOD  Result Value Ref Range Status   Specimen Description   Final    BLOOD LEFT ANTECUBITAL Performed at [facility], [address] [place], [place] [address]    Special Requests   Final    BOTTLES DRAWN AEROBIC AND ANAEROBIC Blood Culture adequate volume Performed at [facility], [address] [place], [place] [address]    Culture   Final    NO GROWTH 2 DAYS Performed at [facility], [address] [place], [place] [address]    Report Status PENDING  Incomplete   Time spent: 30 min  SIGNED:   Rickey BarbaraStephen Calisha Tindel, MD  [facility] 05/14/2021, 3:44 PM  If 7PM-7AM, please contact night-coverage

## 2021-05-14 NOTE — Progress Notes (Signed)
Per Dr. Norris Cross request, sent msg to ofc to arrange 30 day monitor, and arranged 2 week f/u as well. Ofc will mail monitor to patient's house. Appt info placed on AVS.

## 2021-05-15 LAB — GLUCOSE, CAPILLARY
Glucose-Capillary: 85 mg/dL (ref 70–99)
Glucose-Capillary: 97 mg/dL (ref 70–99)

## 2021-05-15 NOTE — Progress Notes (Signed)
Patient discharged to home via [facility]. Niece, [name], called and made aware of transfer. Patient 0400 vital signs stable. Stickers removed. Peri-care completed, and purwick removed. PIV removed 05/14/21. Alphagan and Azopt labeled and given to [facility] transporter. Printed documents given to transport. Patient in good spirits.  Oliver Barre, RN

## 2021-05-17 ENCOUNTER — Encounter: Payer: Self-pay | Admitting: *Deleted

## 2021-05-17 LAB — CULTURE, BLOOD (ROUTINE X 2)
Culture: NO GROWTH
Culture: NO GROWTH
Special Requests: ADEQUATE
Special Requests: ADEQUATE

## 2021-05-17 NOTE — Progress Notes (Signed)
Patient ID: Kaitlin Smith, female   DOB: 1930/07/01, 87 y.o.   MRN: [id number] Patient enrolled for [employer] to ship a 30 day cardiac event monitor to her home. Letter with instructions mailed to patient.

## 2021-05-20 ENCOUNTER — Telehealth: Payer: Self-pay | Admitting: General Practice

## 2021-05-20 NOTE — Telephone Encounter (Signed)
Pt's niece [name] is calling in regards to pt's upcoming appt with Edd Fabian on 05/28/21, pt was released from the hospital on Saturday morning and she is now Bedridden, niece wants to know If pt's appt can be approved for a Virtual Visit

## 2021-05-20 NOTE — Telephone Encounter (Signed)
Left detailed VM stating patient may have a virtual appointment. Appointment was changed to virtual. Pt instructed to call with any additional questions.

## 2021-05-25 ENCOUNTER — Telehealth: Payer: Self-pay | Admitting: Radiology

## 2021-05-25 NOTE — Telephone Encounter (Signed)
Patients Niece called with questions about the heart monitor that was ordered for the patient in the hospital. I answered all her question but she also is concerned about applying it. I offered for her to bring patient in to have monitor applied I the office. Patient is now bed bound and she is unable to bring her in. I explained she can call the company and they can try to walk her though applying it over the phone. She is going to wait till after patients virtual appt with Edd Fabian, NP on Friday to decide what to do.

## 2021-05-27 NOTE — Progress Notes (Signed)
Virtual Visit via Telephone Note   This visit type was conducted due to national recommendations for restrictions regarding the COVID-19 Pandemic (e.g. social distancing) in an effort to limit this patient's exposure and mitigate transmission in our community.  Due to her co-morbid illnesses, this patient is at least at moderate risk for complications without adequate follow up.  This format is felt to be most appropriate for this patient at this time.  The patient did not have access to video technology/had technical difficulties with video requiring transitioning to audio format only (telephone).  All issues noted in this document were discussed and addressed.  No physical exam could be performed with this format.  Please refer to the patient's chart for her  consent to telehealth for [facility].  Evaluation Performed:  Follow-up visit  This visit type was conducted due to national recommendations for restrictions regarding the COVID-19 Pandemic (e.g. social distancing).  This format is felt to be most appropriate for this patient at this time.  All issues noted in this document were discussed and addressed.  No physical exam was performed (except for noted visual exam findings with Video Visits).  Please refer to the patient's chart (MyChart message for video visits and phone note for telephone visits) for the patient's consent to telehealth for [facility]  Date:  05/28/2021   ID:  Kaitlin Smith, DOB 12/16/1929, MRN [id number]  Patient Location:  [address] [place] [address]   Provider location:     [facility] [address] Office [phone] Fax [phone]   PCP:  Alysia Penna, MD  Cardiologist:  Nanetta Batty, MD  Electrophysiologist:  None   Chief Complaint: Follow-up for syncope  History of Present Illness:    Kaitlin Smith is a 87 y.o. female who presents via audio/video conferencing for a  telehealth visit today.  Patient verified DOB and address.  She has a PMH of coronary artery disease, type 2 diabetes, hyperlipidemia, dementia, PEs, CVA, chronic combined CHF, COVID infection 2021, HTN, and PVD.  She presented to the hospital on 05/12/2021 and was discharged on 05/15/2021.  She noted syncope with labile blood pressures.  Her echocardiogram showed an EF greater than 75%.  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  This was unchanged from her prior study in 2015.  She is not a candidate for revascularization due to her dementia and advanced age.  A cardiac event monitor was recommended.  A [employer] 30-day cardiac event monitor was sent to her home on 05/17/2021.  She was also noted to have a UTI and received Rocephin.  Her niece contacted the cardiology office on 05/20/2021 and reported that her aunt was bedbound.  Her appointment was changed to virtual.  She is contacted via telephone today for follow-up and states she feels well.  She is joined by her niece [name] who is her primary caregiver.  She reports that she has been doing well with her diet.  She has been helping her with her ADLs.  Upon discharge from the hospital they were told that home health PT would come to their house for home physical therapy and bathing.  They have not yet been to her house.  I have asked her to contact the company.  She has their information and phone number.  Her blood pressure today is 155/89 however, she took her blood pressure medication just prior to alcohol.  Her  blood pressure has been running in the 120-100 range systolic.  She denies any further episodes of irregular or accelerated heartbeat.  She has not yet placed the cardiac event monitor.  We reviewed instructions on how to place the monitor and have her return it.  She expressed understanding.  I will give her the salty 6 diet sheet, have her continue her low-sodium diet, and have her follow-up after we  receive results from her cardiac event monitor.  Today she denies chest pain, shortness of breath, lower extremity edema, fatigue, palpitations, melena, hematuria, hemoptysis, diaphoresis, weakness, presyncope, syncope, orthopnea, and PND.   The patient does not symptoms concerning for COVID-19 infection (fever, chills, cough, or new SHORTNESS OF BREATH).    Prior CV studies:   The following studies were reviewed today:  Cardiology Nuclear Med Study   Kaitlin Smith is a 87 y.o. female     MRN : [id number]     DOB: September 30, 1929   Procedure Date: 11/06/2014   Nuclear Med Background Indication for Stress Test:  Evaluation for Ischemia and Abnormal EKG History:  CAD;MI-2015;No prior NUC MPI for comparison;No prior respiratory history reported Cardiac Risk Factors: Carotid Disease, CVA, History of Smoking, Hypertension, Lipids, PVD and SFA's;Inferior occluded dorsalis pedis & peroneal  Symptoms:  bilateral shoulder pain     Nuclear Pre-Procedure Caffeine/Decaff Intake:  1:00am NPO After: 11am   IV Site: R Forearm  IV 0.9% NS with Angio Cath:  22g  Chest Size (in):  n/a IV Started by: Berdie Ogren, RN  Height: 5\' 5"  (1.651 m)  Cup Size: C  BMI:  Body mass index is 24.63 kg/(m^2). Weight:  148 lb (67.132 kg)    Tech Comments:  n/a      Nuclear Med Study 1 or 2 day study: 1 day  Stress Test Type:  Lexiscan  Order Authorizing Provider:  , MD    Resting Radionuclide: Technetium 12m Sestamibi  Resting Radionuclide Dose: 10.9 mCi   Stress Radionuclide:  Technetium 37m Sestamibi  Stress Radionuclide Dose: 29.4 mCi            Stress Protocol Rest HR: 65 Stress HR: 99  Rest BP: 192/82 Stress BP: 161/70  Exercise Time (min): n/a METS: n/a    Predicted Max HR: 138 bpm % Max HR: 71.74 bpm Rate Pressure Product: 84m   Dose of Adenosine (mg):  n/a Dose of Lexiscan: 0.4 mg  Dose of Atropine (mg): n/a Dose of Dobutamine: n/a mcg/kg/min (at max HR)  Stress Test Technologist:  [id number], CCT Nuclear Technologist: Esperanza Sheets, CNMT    Rest Procedure:  Myocardial perfusion imaging was performed at rest 45 minutes following the intravenous administration of Technetium 22m Sestamibi. Stress Procedure:  The patient received IV Lexiscan 0.4 mg over 15-seconds.  Technetium 97m Sestamibi injected at 30-seconds.  There were no significant changes with Lexiscan.  Quantitative spect images were obtained after a 45 minute delay.   Transient Ischemic Dilatation (Normal <1.22):  0.93   QGS EDV:  80 ml QGS ESV:  29 ml LV Ejection Fraction: 64%       Rest ECG: NSR with first degree AV block.   Stress ECG: No significant ST segment change suggestive of ischemia.   QPS Raw Data Images:  Acquisition technically good; normal left ventricular size. Stress Images:  Normal homogeneous uptake in all areas of the myocardium. Rest Images:  Normal homogeneous uptake in all areas of the myocardium. Subtraction (SDS):  No evidence of ischemia.  Impression Exercise Capacity:  Lexiscan with no exercise. BP Response:  Normal blood pressure response. Clinical Symptoms:  No significant symptoms noted. ECG Impression:  No significant ST segment change suggestive of ischemia. Comparison with Prior Nuclear Study: No previous nuclear study performed   Overall Impression:  Normal stress nuclear study.   LV Wall Motion:  NL LV Function; NL Wall Motion     Olga MillersBrian Crenshaw, MD    Echocardiogram 05/13/2021  IMPRESSIONS     1. Hyperdynamic LV function; moderate LVH with proximal septal  thickening.   2. Left ventricular ejection fraction, by estimation, is >75%. The left  ventricle has hyperdynamic function. The left ventricle has no regional  wall motion abnormalities. There is moderate left ventricular hypertrophy.  Left ventricular diastolic  parameters are consistent with Grade I diastolic dysfunction (impaired  relaxation).   3. Right ventricular systolic function is  mildly reduced. The right  ventricular size is normal.   4. The mitral valve is normal in structure. No evidence of mitral valve  regurgitation. No evidence of mitral stenosis.   5. The aortic valve is tricuspid. Aortic valve regurgitation is not  visualized. Mild to moderate aortic valve sclerosis/calcification is  present, without any evidence of aortic stenosis.  Past Medical History:  Diagnosis Date   Allergy    Arthritis    Blindness    Coronary artery disease    Diabetes mellitus without complication (HCC)    Glaucoma    Hyperlipidemia    Hypertension    Myocardial infarction [facility](HCC)    Peripheral vascular disease (HCC)    Stroke [facility](HCC)    Past Surgical History:  Procedure Laterality Date   EYE SURGERY       Current Meds  Medication Sig   acetaminophen (TYLENOL) 325 MG tablet Take 2 tablets (650 mg total) by mouth every 6 (six) hours as needed for mild pain (temp > 101.5).   losartan (COZAAR) 25 MG tablet Take 1 tablet (25 mg total) by mouth daily.   LUMIGAN 0.01 % SOLN Place 1 drop into both eyes at bedtime.   nitrofurantoin, macrocrystal-monohydrate, (MACROBID) 100 MG capsule Take 100 mg by mouth daily.   pantoprazole (PROTONIX) 40 MG tablet TAKE 1 TABLET BY MOUTH EVERY DAY   rivaroxaban (XARELTO) 20 MG TABS tablet Take 1 tablet (20 mg total) by mouth daily.   senna (SENOKOT) 8.6 MG TABS tablet Take 1 tablet (8.6 mg total) by mouth daily.   SIMBRINZA 1-0.2 % SUSP Place 1 drop into both eyes daily.   tamsulosin (FLOMAX) 0.4 MG CAPS capsule Take 0.4 mg by mouth daily.   VOLTAREN 1 % GEL APPLY 4 GRAMS TOPICALLY 2 TIMES DAILY AS NEEDED.     Allergies:   Clonidine derivatives and Milk-related compounds   Social History   Tobacco Use   Smoking status: Former    Types: Cigarettes    Quit date: 06/06/2002    Years since quitting: 18.9   Smokeless tobacco: Never   Tobacco comments:    Quit at age 89  Vaping Use   Vaping Use: Never used  Substance Use Topics    Alcohol use: No    Alcohol/week: 0.0 standard drinks   Drug use: No     Family Hx: The patient's family history includes Alcohol abuse in her brother; Cancer in her brother and father; Diabetes in her brother, mother, and sister; Heart attack in her father; Hypertension in her father and sister; Peripheral vascular disease in her mother; Stroke in her  mother; Varicose Veins in her brother and father.  ROS:   Please see the history of present illness.     All other systems reviewed and are negative.   Labs/Other Tests and Data Reviewed:    Recent Labs: 09/08/2020: B Natriuretic Peptide 1,007.4 05/12/2021: ALT 11; BUN 8; Creatinine, Ser 0.65; Hemoglobin 13.8; Magnesium 1.9; Platelets 252; Potassium 4.7; Sodium 139; TSH 0.688   Recent Lipid Panel Lab Results  Component Value Date/Time   CHOL 184 08/25/2014 10:51 AM   TRIG 78 08/25/2014 10:51 AM   HDL 62 08/25/2014 10:51 AM   CHOLHDL 3.0 08/25/2014 10:51 AM   LDLCALC 106 (H) 08/25/2014 10:51 AM    Wt Readings from Last 3 Encounters:  05/11/21 147 lb 0.8 oz (66.7 kg)  01/01/21 147 lb (66.7 kg)  09/13/20 133 lb 6.1 oz (60.5 kg)     Exam:    Vital Signs:  BP (!) 155/89 (Patient Position: Supine)   Pulse 82    Well nourished, well developed female in no  acute distress.   ASSESSMENT & PLAN:    1.  Syncope-no further episodes of presyncope or syncope.  Maintaining hydration.  Received cardiac event monitor and reports it is in place.  Echocardiogram showed LVEF greater than 75%. Continue p.o. hydration, liberalize sodium Heart healthy diet Increase physical activity as tolerated Recommend chair/bed physical therapy exercises Await results of cardiac event monitor  Chronic combined systolic and diastolic CHF-denies lower extremity swelling and weight gain.  Echocardiogram showed hyperdynamic LV function and G1 DD.  Details above. Continue losartan Heart healthy low-sodium diet-salty 6 given Increase physical activity as  tolerated  Essential hypertension-BP today 155/89.  Well-controlled at home.  Systolic blood pressures in the 120s at home. Continue losartan Heart healthy low-sodium diet  Hyperlipidemia-LDL 127 on 07/14/2020 Heart healthy low-sodium high-fiber diet Increase physical activity as tolerated  History of pulmonary embolus-denies bleeding issues.  Reports compliance with Xarelto.  Disposition: Follow-up with Dr. Allyson Sabal or me in 6-8 weeks.   COVID-19 Education: The signs and symptoms of COVID-19 were discussed with the patient and how to seek care for testing (follow up with PCP or arrange E-visit).  The importance of social distancing was discussed today.  Patient Risk:   After full review of this patients clinical status, I feel that they are at least moderate risk at this time.  Time:   Today, I have spent 14 minutes with the patient with telehealth technology discussing recent hospitalization, medications, diet, physical therapy, and cardiac event monitor.     Medication Adjustments/Labs and Tests Ordered: Current medicines are reviewed at length with the patient today.  Concerns regarding medicines are outlined above.   Tests Ordered: No orders of the defined types were placed in this encounter.  Medication Changes: No orders of the defined types were placed in this encounter.   Disposition:  in 8 week(s)  Signed, Thomasene Ripple. Danyka Merlin NP-C    04/23/2019 11:58 AM    [facility] [address] Office [phone] Fax [phone]

## 2021-05-28 ENCOUNTER — Telehealth (INDEPENDENT_AMBULATORY_CARE_PROVIDER_SITE_OTHER): Payer: Federal, State, Local not specified - PPO | Admitting: General Practice

## 2021-05-28 ENCOUNTER — Encounter: Payer: Self-pay | Admitting: General Practice

## 2021-05-28 ENCOUNTER — Telehealth: Payer: Self-pay | Admitting: Cardiovascular Disease

## 2021-05-28 VITALS — BP 155/89 | HR 82

## 2021-05-28 DIAGNOSIS — I10 Essential (primary) hypertension: Secondary | ICD-10-CM

## 2021-05-28 DIAGNOSIS — R55 Syncope and collapse: Secondary | ICD-10-CM | POA: Diagnosis not present

## 2021-05-28 DIAGNOSIS — E782 Mixed hyperlipidemia: Secondary | ICD-10-CM

## 2021-05-28 DIAGNOSIS — I5042 Chronic combined systolic (congestive) and diastolic (congestive) heart failure: Secondary | ICD-10-CM

## 2021-05-28 NOTE — Telephone Encounter (Signed)
Called Kaitlin Smith at [facility] she states that she need orders and demographics and recent office visit. Fax [phone]

## 2021-05-28 NOTE — Telephone Encounter (Signed)
Patient's niece states [facility] is requesting order for patient to have PT and 2 home baths. The order may be faxed to [facility] at [phone].

## 2021-05-28 NOTE — Patient Instructions (Signed)
Medication Instructions:  The current medical regimen is effective;  continue present plan and medications as directed. Please refer to the Current Medication list given to you today.   *If you need a refill on your cardiac medications before your next appointment, please call your pharmacy*  Lab Work:   Testing/Procedures:  NONE    NONE  Special Instructions PLEASE READ AND FOLLOW SALTY 6-ATTACHED-1,800mg  daily  APPLY AND RETURN MONITOR AFTER 30 DAYS, ASAP  MONITOR HYDRATION INTAKE 48-64 OUNCES  Follow-Up: Your next appointment:  08-10-2021   In Person with Edd Fabian, FNP   At [facility], you and your health needs are our priority.  As part of our continuing mission to provide you with exceptional heart care, we have created designated Provider Care Teams.  These Care Teams include your primary Cardiologist (physician) and Advanced Practice Providers (APPs -  Physician Assistants and Nurse Practitioners) who all work together to provide you with the care you need, when you need it.  We recommend signing up for the patient portal called "MyChart".  Sign up information is provided on this After Visit Summary.  MyChart is used to connect with patients for Virtual Visits (Telemedicine).  Patients are able to view lab/test results, encounter notes, upcoming appointments, etc.  Non-urgent messages can be sent to your provider as well.   To learn more about what you can do with MyChart, go to [email or web address].              6 SALTY THINGS TO AVOID     1,800MG  DAILY

## 2021-05-28 NOTE — Addendum Note (Signed)
Addended by: Alyson Ingles on: 05/28/2021 02:02 PM   Modules accepted: Orders

## 2021-05-31 ENCOUNTER — Telehealth: Payer: Self-pay

## 2021-05-31 ENCOUNTER — Telehealth: Payer: Self-pay | Admitting: Licensed Clinical Social Worker

## 2021-05-31 NOTE — Telephone Encounter (Signed)
Spoke with patient's niece, [name]. [name] inquiring about respite as she has been providing care for patient for many years. Provided education the Palliative care does not include respite care, unfortunately. Will also reach out to SW for follow up.

## 2021-05-31 NOTE — Telephone Encounter (Signed)
Received message to call patient's niece, [name]. Return VM left with call back information.

## 2021-05-31 NOTE — Telephone Encounter (Signed)
LCSW received referral for assistance w/ challenges getting pt enrolled w/ HH PT. Unfortunately, HH was not arranged by inpatient teams for pt to have at home on discharge. She has been active in the past with [facility], and wants to stay with them. There is some challenge with getting it ordered in the outpatient setting and Marcelino Duster, LPN, was hoping that we could be of assistance. LCSW able to speak with Lorenza Chick, liaison w/ [facility]- explained current needs for pt. He was able to assist with getting this ordered for pt. Remain available as needed, [facility] aware.   Octavio Graves, MSW, LCSW [facility] Heart/Vascular Care Navigation  [phone]

## 2021-05-31 NOTE — Telephone Encounter (Signed)
Called [facility] s/w Eileen Stanford she states that she needs hosp D/C note Faxed via epic fax function to [phone] as requested.   S/w our Social Worker she states that she discuss this with [facility] and they will let us know if they need anything else

## 2021-06-01 ENCOUNTER — Emergency Department (HOSPITAL_BASED_OUTPATIENT_CLINIC_OR_DEPARTMENT_OTHER)
Admission: EM | Admit: 2021-06-01 | Discharge: 2021-06-02 | Disposition: A | Discharge status: Home / Self Care | Payer: Federal, State, Local not specified - PPO | Attending: Emergency Medicine | Admitting: Emergency Medicine

## 2021-06-01 ENCOUNTER — Other Ambulatory Visit: Payer: Self-pay

## 2021-06-01 ENCOUNTER — Encounter (HOSPITAL_BASED_OUTPATIENT_CLINIC_OR_DEPARTMENT_OTHER): Payer: Self-pay | Admitting: Emergency Medicine

## 2021-06-01 DIAGNOSIS — I2510 Atherosclerotic heart disease of native coronary artery without angina pectoris: Secondary | ICD-10-CM | POA: Insufficient documentation

## 2021-06-01 DIAGNOSIS — Z87891 Personal history of nicotine dependence: Secondary | ICD-10-CM | POA: Insufficient documentation

## 2021-06-01 DIAGNOSIS — I5043 Acute on chronic combined systolic (congestive) and diastolic (congestive) heart failure: Secondary | ICD-10-CM | POA: Diagnosis not present

## 2021-06-01 DIAGNOSIS — Z79899 Other long term (current) drug therapy: Secondary | ICD-10-CM | POA: Diagnosis not present

## 2021-06-01 DIAGNOSIS — M549 Dorsalgia, unspecified: Secondary | ICD-10-CM | POA: Insufficient documentation

## 2021-06-01 DIAGNOSIS — I110 Hypertensive heart disease with heart failure: Secondary | ICD-10-CM | POA: Diagnosis not present

## 2021-06-01 DIAGNOSIS — Z0000 Encounter for general adult medical examination without abnormal findings: Secondary | ICD-10-CM

## 2021-06-01 DIAGNOSIS — Z8616 Personal history of COVID-19: Secondary | ICD-10-CM | POA: Diagnosis not present

## 2021-06-01 DIAGNOSIS — E119 Type 2 diabetes mellitus without complications: Secondary | ICD-10-CM | POA: Insufficient documentation

## 2021-06-01 DIAGNOSIS — Z7901 Long term (current) use of anticoagulants: Secondary | ICD-10-CM | POA: Insufficient documentation

## 2021-06-01 DIAGNOSIS — Z7689 Persons encountering health services in other specified circumstances: Secondary | ICD-10-CM

## 2021-06-01 NOTE — ED Triage Notes (Signed)
Pt arrives to ED via [facility] with c/o of back pain and bilateral shoulder pain x1 week. Pt lives at home with daughter. Daughter called EMS to have pt evaluated so she can go to rehab. Back pain 1/10.

## 2021-06-01 NOTE — ED Provider Notes (Signed)
[facility] EMERGENCY DEPT Provider Note   CSN: [id number] Arrival date & time: 06/01/21  1505     History Chief Complaint  Patient presents with   Back Pain    Kaitlin Smith is a 87 y.o. female.  87 yo female with history as below including CVA, DM, CAD presented to the ER as the niece is hoping to place the patient in rehab.  Niece reports she spoke with patient's cardiologist and someone who works at her primary care doctor's office who agree with possible rehab placement.  Needs reports they were instructed to go to [facility] to help set up the process but the wait was too long so they came to [facility] ED instead.  Niece reports that she is unable to care for the patient any longer and is refusing to take her home.  Patient herself has no acute complaints.  No fevers, chills, nausea, vomiting, numbness, tingling, weakness, chest pain or dyspnea.  She is tolerant oral intake without difficulty.  No change in bowel or bladder function.  Taking all of her medications as prescribed.  Patient denies any back pain or acute medical complaints at this time.  She has no back pain   The history is provided by the patient and a caregiver. No language interpreter was used.  Back Pain Associated symptoms: no abdominal pain, no chest pain, no dysuria, no fever and no headaches       Past Medical History:  Diagnosis Date   Allergy    Arthritis    Blindness    Coronary artery disease    Diabetes mellitus without complication (HCC)    Glaucoma    Hyperlipidemia    Hypertension    Myocardial infarction [facility])    Peripheral vascular disease (HCC)    Stroke [facility])     Patient Active Problem List   Diagnosis Date Noted   Ileus (HCC) 05/12/2021   Dehydration 05/11/2021   Syncope 05/11/2021   Chronic combined systolic (congestive) and diastolic (congestive) heart failure (HCC) 05/11/2021   Pulmonary embolism (HCC) 01/02/2021   Acute on chronic combined systolic and diastolic CHF  (congestive heart failure) (HCC)    Severe sepsis (HCC)    Non-ST elevation (NSTEMI) myocardial infarction (HCC)    Acute respiratory failure with hypoxia (HCC)    Palliative care by specialist    Goals of care, counseling/discussion    General weakness    Controlled type 2 diabetes mellitus with hyperglycemia (HCC) 09/01/2020   COVID 09/01/2020   Acute lower UTI 09/01/2020   Acute hypoxemic respiratory failure due to COVID-19 (HCC) 09/01/2020   COVID-19 virus infection 08/31/2020   Aortic dissection (HCC) 08/17/2018   Hypertensive emergency 08/17/2018   Abnormal EKG 01/30/2018   Osteoarthritis of right knee 12/04/2017   MCI (mild cognitive impairment) 06/30/2015   Essential hypertension 08/20/2014   Hyperlipidemia 08/20/2014   Peripheral arterial disease (HCC) 08/20/2014   Carotid artery disease (HCC) 06/06/2014   DM2 (diabetes mellitus, type 2) (HCC) 05/13/2014   History of CVA (cerebrovascular accident) 05/13/2014   History of MI (myocardial infarction) 05/13/2014    Past Surgical History:  Procedure Laterality Date   EYE SURGERY       OB History   No obstetric history on file.     Family History  Problem Relation Age of Onset   Diabetes Mother    Stroke Mother    Peripheral vascular disease Mother        amputation   Cancer Brother  Diabetes Brother    Alcohol abuse Brother    Varicose Veins Brother    Hypertension Father    Varicose Veins Father    Heart attack Father    Cancer Father    Diabetes Sister    Hypertension Sister     Social History   Tobacco Use   Smoking status: Former    Types: Cigarettes    Quit date: 06/06/2002    Years since quitting: 19.0   Smokeless tobacco: Never   Tobacco comments:    Quit at age 89  Vaping Use   Vaping Use: Never used  Substance Use Topics   Alcohol use: No    Alcohol/week: 0.0 standard drinks   Drug use: No    Home Medications Prior to Admission medications   Medication Sig Start Date End Date  Taking? Authorizing Provider  losartan (COZAAR) 25 MG tablet Take 1 tablet (25 mg total) by mouth daily. 05/15/21 06/14/21 Yes Jerald Kief, MD  LUMIGAN 0.01 % SOLN Place 1 drop into both eyes at bedtime. 09/10/19  Yes [provider]  nitrofurantoin, macrocrystal-monohydrate, (MACROBID) 100 MG capsule Take 100 mg by mouth daily.   Yes [provider]  pantoprazole (PROTONIX) 40 MG tablet TAKE 1 TABLET BY MOUTH EVERY DAY 11/15/19  Yes Runell Gess, MD  rivaroxaban (XARELTO) 20 MG TABS tablet Take 1 tablet (20 mg total) by mouth daily. 05/15/21 06/14/21 Yes Jerald Kief, MD  senna (SENOKOT) 8.6 MG TABS tablet Take 1 tablet (8.6 mg total) by mouth daily. 05/15/21  Yes Jerald Kief, MD  SIMBRINZA 1-0.2 % SUSP Place 1 drop into both eyes daily. 02/13/20  Yes [provider]  tamsulosin (FLOMAX) 0.4 MG CAPS capsule Take 0.4 mg by mouth daily.   Yes [provider]  acetaminophen (TYLENOL) 325 MG tablet Take 2 tablets (650 mg total) by mouth every 6 (six) hours as needed for mild pain (temp > 101.5). 08/22/18   Elgergawy, Leana Roe, MD  VOLTAREN 1 % GEL APPLY 4 GRAMS TOPICALLY 2 TIMES DAILY AS NEEDED. 04/25/16   Runell Gess, MD    Allergies    Clonidine derivatives and Milk-related compounds  Review of Systems   Review of Systems  Constitutional:  Negative for activity change and fever.  HENT:  Negative for facial swelling and trouble swallowing.   Eyes:  Negative for discharge and redness.  Respiratory:  Negative for cough and shortness of breath.   Cardiovascular:  Negative for chest pain and palpitations.  Gastrointestinal:  Negative for abdominal pain and nausea.  Genitourinary:  Negative for dysuria and flank pain.  Musculoskeletal:  Negative for back pain and gait problem.  Skin:  Negative for pallor and rash.  Neurological:  Negative for syncope and headaches.   Physical Exam Updated Vital Signs BP (!) 168/72 (BP Location: Right Arm)    Pulse 82   Temp 98.1 F (36.7 C) (Oral)   Resp 18   SpO2 100%   Physical Exam Vitals and nursing note reviewed.  Constitutional:      General: She is not in acute distress.    Appearance: Normal appearance.  HENT:     Head: Normocephalic and atraumatic.     Right Ear: External ear normal.     Left Ear: External ear normal.     Nose: Nose normal.     Mouth/Throat:     Mouth: Mucous membranes are moist.  Eyes:     General: No scleral icterus.  Right eye: No discharge.        Left eye: No discharge.  Cardiovascular:     Rate and Rhythm: Normal rate and regular rhythm.     Pulses: Normal pulses.     Heart sounds: Normal heart sounds.  Pulmonary:     Effort: Pulmonary effort is normal. No respiratory distress.     Breath sounds: Normal breath sounds.  Abdominal:     General: Abdomen is flat.     Tenderness: There is no abdominal tenderness.  Musculoskeletal:        General: Normal range of motion.     Cervical back: Full passive range of motion without pain and normal range of motion.     Right lower leg: No edema.     Left lower leg: No edema.     Comments: Full ROM to b/l UE, no pain with axial twisting of torso or neck rotation.   Skin:    General: Skin is warm and dry.     Capillary Refill: Capillary refill takes less than 2 seconds.  Neurological:     Mental Status: She is alert and oriented to person, place, and time.     GCS: GCS eye subscore is 4. GCS verbal subscore is 5. GCS motor subscore is 6.  Psychiatric:        Mood and Affect: Mood normal.        Behavior: Behavior normal.    ED Results / Procedures / Treatments   Labs (all labs ordered are listed, but only abnormal results are displayed) Labs Reviewed - No data to display  EKG None  Radiology No results found.  Procedures Procedures   Medications Ordered in ED Medications - No data to display  ED Course  I have reviewed the triage vital signs and the nursing notes.  Pertinent labs  & imaging results that were available during my care of the patient were reviewed by me and considered in my medical decision making (see chart for details).    MDM Rules/Calculators/A&P                           87 yo female with history as above to ED with concern for possible rehab placement by niece. Pt has no acute medical complaints.  She is nontoxic-appearing she is cooperative with exam.  Breathing comfortably on room air.  SHe has no acute complaints at this time.  Vital signs reviewed and are stable.  Serious  etiology considered.  Consult placed to SW  D/w SW who will discuss with [name] pt's niece regarding care options.  After d/w SW, plan to DC patient home and set up in home care vs long term senior care.  Pt reassessed, again she has no acute complaints.  Niece verbalized understanding with current plan.  The patient improved significantly and was discharged in stable condition. Detailed discussions were had with the patient regarding current findings, and need for close f/u with PCP or on call doctor. The patient has been instructed to return immediately if the symptoms worsen in any way for re-evaluation. Patient verbalized understanding and is in agreement with current care plan. All questions answered prior to discharge.   Final Clinical Impression(s) / ED Diagnoses Final diagnoses:  Encounter for physical examination  Encounter for social work intervention    Rx / DC Orders ED Discharge Orders     None        Sloan Leiter,  DO 06/01/21 1714

## 2021-06-01 NOTE — Progress Notes (Signed)
.  Transition of Care [facility]) - Emergency Department Mini Assessment   Patient Details  Name: Kaitlin Smith MRN: [id number] Date of Birth: 1930-07-26  Transition of Care [facility]) CM/SW Contact:    Larrie Kass, LCSW Phone Number: 06/01/2021, 4:49 PM   Clinical Narrative: CSW spoke with pt's niece [name] [phone]). Pt's niece stated she was seeking short term rehab for pt. Pt's niece stated pt can not walk., is incontinent ,blind and has issues with her heart. Pt's daughter stated pt was in the hospital last year in december , were short term rehab was recommended. However pt's niece decided to take pt home with [facility] services in place. CSW explained to pt's niece, SNF placement for short term rehab was not recommended by physical therapy when she was seen in the hospital last month.   CSW informed pt's niece about the process for long-term placement. CSW also explained pt will have to be d/c home while she is going through that process. CSW informed pt's niece, pt is active with [facility] PT. Pt's niece was also informed about private duty care agencies, and they are not covered by insurance, they are an out-of-pocket expense. Niece requested pt be transported by EMS upon d/c.   Valentina Shaggy.Kadeen Sroka, MSW, LCSWA [facility]  Transitions of Care Clinical Social Worker I Direct Dial: [phone]  Fax: [phone] [email or web address]       ED Mini Assessment: What brought you to the Emergency Department? : SNF placemnt  Barriers to Discharge: No Barriers Identified     Means of departure: Ambulance  Interventions which prevented an admission or readmission: Transportation Screening, Home Health Consult or Services, Follow-up medical appointment    Patient Contact and Communications        ,                 Admission diagnosis:  No admission diagnoses are documented for this encounter. Patient Active Problem List    Diagnosis Date Noted   Ileus (HCC) 05/12/2021   Dehydration 05/11/2021   Syncope 05/11/2021   Chronic combined systolic (congestive) and diastolic (congestive) heart failure (HCC) 05/11/2021   Pulmonary embolism (HCC) 01/02/2021   Acute on chronic combined systolic and diastolic CHF (congestive heart failure) (HCC)    Severe sepsis (HCC)    Non-ST elevation (NSTEMI) myocardial infarction [facility])    Acute respiratory failure with hypoxia (HCC)    Palliative care by specialist    Goals of care, counseling/discussion    General weakness    Controlled type 2 diabetes mellitus with hyperglycemia (HCC) 09/01/2020   COVID 09/01/2020   Acute lower UTI 09/01/2020   Acute hypoxemic respiratory failure due to COVID-19 (HCC) 09/01/2020   COVID-19 virus infection 08/31/2020   Aortic dissection (HCC) 08/17/2018   Hypertensive emergency 08/17/2018   Abnormal EKG 01/30/2018   Osteoarthritis of right knee 12/04/2017   MCI (mild cognitive impairment) 06/30/2015   Essential hypertension 08/20/2014   Hyperlipidemia 08/20/2014   Peripheral arterial disease (HCC) 08/20/2014   Carotid artery disease (HCC) 06/06/2014   DM2 (diabetes mellitus, type 2) (HCC) 05/13/2014   History of CVA (cerebrovascular accident) 05/13/2014   History of MI (myocardial infarction) 05/13/2014   PCP:  Alysia Penna, MD Pharmacy:   [facility] [phone] [place] Otto, [place] - [address] AT [facility] [address] [place] [address] Phone: [phone] Fax: [phone]

## 2021-06-02 MED ORDER — ACETAMINOPHEN 500 MG PO TABS
1000.0000 mg | ORAL_TABLET | Freq: Once | ORAL | Status: AC
  Administered 2021-06-02: 1000 mg via ORAL
  Filled 2021-06-02: qty 2

## 2021-06-02 NOTE — ED Notes (Signed)
PTAR advised patient is next to be picked up, currently doing a drop off at [facility] from then will head this way. Please advise niece that she will be arriving soon.

## 2021-06-02 NOTE — ED Notes (Signed)
Called Charma Igo, niece regarding PTAR enroute to transfer back to her home. States will be awaiting her arrival.

## 2021-06-07 ENCOUNTER — Ambulatory Visit (INDEPENDENT_AMBULATORY_CARE_PROVIDER_SITE_OTHER): Payer: Federal, State, Local not specified - PPO

## 2021-06-07 ENCOUNTER — Telehealth: Payer: Self-pay

## 2021-06-07 DIAGNOSIS — R55 Syncope and collapse: Secondary | ICD-10-CM

## 2021-06-07 NOTE — Telephone Encounter (Signed)
06/07/21 @ 9AM: Palliative care SW outreached patients daughter, [name], to follow up on respite care needs as she and her family will be going out of town in [place] for a family reunion. SW advised daughter that most respite coverage's are private pay whether in the home or in a facility.  SW provided daughter with the following community: [facility]    Daughter had no other concerns for SW at this time.

## 2021-06-09 NOTE — Telephone Encounter (Signed)
Garrel Ridgel, physical therapist with [facility], calling to clarify orders. She states they need an order for physical therapy 1 time a week for 1 week and then 2 times a week for 3 weeks. She states they are requesting an order for a home health aid to help with personal care for 3 weeks. She states the patient is also having symptoms of a urine infections and request an order for a nurse. She states the patient is being visited by a palliative doctor who she hopes will address the potential urine infection, but would like the order if it is not addressed. Phone: [phone]

## 2021-06-09 NOTE — Telephone Encounter (Signed)
Called [facility] orders given she will fax orders over to have [facility] sign.   [facility] she states that she has the message that she sent was just wanting an ok for pt to continue with PT. Informed that Verdon Cummins ok'd PT and CNA for baths, ADL's. Verbalized understanding. She will call back if anything else is needed.

## 2021-07-14 ENCOUNTER — Telehealth: Payer: Self-pay | Admitting: Cardiovascular Disease

## 2021-07-14 NOTE — Telephone Encounter (Signed)
Kaitlin Smith from [facility] is following up on Home Health Orders from 06-09-21 through 08-07-21 Certification # [id number]  Medical Social Worker Evaluation #[id number]   Cloyd Stagers is refaxing the orders right now to [phone]

## 2021-07-16 NOTE — Telephone Encounter (Signed)
Orders received and signed by Dr. Allyson Sabal. Faxed back to [facility].

## 2021-07-21 ENCOUNTER — Telehealth: Payer: Self-pay | Admitting: Cardiovascular Disease

## 2021-07-21 NOTE — Telephone Encounter (Signed)
Re-faxed orders today to fax number provided.

## 2021-07-21 NOTE — Telephone Encounter (Signed)
Follow Up:      Burna Mortimer from [facility] called. She says she still have not received the order for the Child psychotherapist. Please fax to [phone] Attention:Wanda.

## 2021-08-09 NOTE — Progress Notes (Signed)
Virtual Visit via Telephone Note   This visit type was conducted due to national recommendations for restrictions regarding the COVID-19 Pandemic (e.g. social distancing) in an effort to limit this patient's exposure and mitigate transmission in our community.  Due to her co-morbid illnesses, this patient is at least at moderate risk for complications without adequate follow up.  This format is felt to be most appropriate for this patient at this time.  The patient did not have access to video technology/had technical difficulties with video requiring transitioning to audio format only (telephone).  All issues noted in this document were discussed and addressed.  No physical exam could be performed with this format.  Please refer to the patient's chart for her  consent to telehealth for [facility].  Evaluation Performed:  Follow-up visit  This visit type was conducted due to national recommendations for restrictions regarding the COVID-19 Pandemic (e.g. social distancing).  This format is felt to be most appropriate for this patient at this time.  All issues noted in this document were discussed and addressed.  No physical exam was performed (except for noted visual exam findings with Video Visits).  Please refer to the patient's chart (MyChart message for video visits and phone note for telephone visits) for the patient's consent to telehealth for [facility]  Date:  08/10/2021   ID:  Kaitlin Smith, DOB April 19, 1930, MRN [id number]  Patient Location:  [facility] [id number]   Provider location:     [place] [address] Office [phone] Fax [phone]   PCP:  Velna Hatchet, MD  Cardiologist:  Quay Burow, MD  Electrophysiologist:  None   Chief Complaint: Follow-up for syncope  History of Present Illness:    Kaitlin Smith is a 87 y.o. female who presents via audio/video conferencing for a  telehealth visit today.  Patient verified DOB and address.  She has a PMH of coronary artery disease, type 2 diabetes, hyperlipidemia, dementia, PEs, CVA, chronic combined CHF, COVID infection 2021, HTN, and PVD.  [facility] to [facility] and was discharged on 05/15/2021.  She noted syncope with labile blood pressures.  Her echocardiogram showed an EF greater than 75%.  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  This was unchanged from her prior study in 2015.  She is not a candidate for revascularization due to her dementia and advanced age.  A cardiac event monitor was recommended.  A preventives 30-day cardiac event monitor was sent to her home on 05/17/2021.  She was also noted to have a UTI and received Rocephin.  Her niece contacted the cardiology office on 05/20/2021 and reported that her aunt was bedbound.  Her appointment was changed to virtual.  She was contacted via telephone 05/31/2021 for follow-up and stated she felt well.  She was joined by her niece [name] who is her primary caregiver.  She reported that she had been doing well with her diet.  She had been helping her with her ADLs.  Upon discharge from the [facility] they were told that home health PT would come to their house for home physical therapy and bathing.  They had not yet been to her house.  I  asked her to contact the company.  She had their information and phone number.  Her blood pressure was 155/89 however, she took her blood pressure medication just prior to the call.  Her  blood pressure had been running in the [id number] range systolic.  She denied any further episodes of irregular or accelerated heartbeat.  She had not yet placed the cardiac event monitor.  We reviewed instructions on how to place the monitor and how to return it.  She expressed understanding.  I gave her the salty 6 diet sheet, had her continue her low-sodium diet, and asked her to follow-up after we received  results from her cardiac event monitor. Her cardiac event monitor 07/13/2021 showed sinus rhythm, sinus tachycardia, and no arrhythmias.  She is contacted via telephone today for follow-up evaluation and states she feels well.  She completed her physical therapy and now has a medical bed which her niece has been helping her use.  She remains bedbound.  She denies further episodes of dizziness.  We will continue her current medications, have her increase her physical activity as tolerated with chair/bed exercises and plan follow-up in 6 months.   Today she denies chest pain, shortness of breath, lower extremity edema, fatigue, palpitations, melena, hematuria, hemoptysis, diaphoresis, weakness, presyncope, syncope, orthopnea, and PND.   The patient does not symptoms concerning for COVID-19 infection (fever, chills, cough, or new SHORTNESS OF BREATH).    Prior CV studies:   The following studies were reviewed today:  Cardiac event monitor 07/13/2021 Sinus rhythm, sinus tachycardia, no  arrhythmias   Cardiology Nuclear Med Study   [name] is a 87 y.o. female     MRN : [id number]     DOB: 06-15-1930   Procedure Date: 11/06/2014   Nuclear Med Background Indication for Stress Test:  Evaluation for Ischemia and Abnormal EKG History:  CAD;MI-2015;No prior NUC MPI for comparison;No prior respiratory history reported Cardiac Risk Factors: Carotid Disease, CVA, History of Smoking, Hypertension, Lipids, PVD and SFA's;Inferior occluded dorsalis pedis & peroneal  Symptoms:  bilateral shoulder pain     Nuclear Pre-Procedure Caffeine/Decaff Intake:  1:00am NPO After: 11am   IV Site: R Forearm  IV 0.9% NS with Angio Cath:  22g  Chest Size (in):  n/a IV Started by: Rolene Course, RN  Height: 5\' 5"  (1.651 m)  Cup Size: C  BMI:  Body mass index is 24.63 kg/(m^2). Weight:  148 lb (67.132 kg)    Tech Comments:  n/a      Nuclear Med Study 1 or 2 day study: 1 day  Stress Test Type:  [place] Provider:  Quay Burow, MD    Resting Radionuclide: Technetium 56m Sestamibi  Resting Radionuclide Dose: 10.9 mCi   Stress Radionuclide:  Technetium 87m Sestamibi  Stress Radionuclide Dose: 29.4 mCi            Stress Protocol Rest HR: 65 Stress HR: 99  Rest BP: 192/82 Stress BP: 161/70  Exercise Time (min): n/a METS: n/a    Predicted Max HR: 138 bpm % Max HR: 71.74 bpm Rate Pressure Product: 19008   Dose of Adenosine (mg):  n/a Dose of Lexiscan: 0.4 mg  Dose of Atropine (mg): n/a Dose of Dobutamine: n/a mcg/kg/min (at max HR)  Stress Test Technologist: Leane Para, CCT Nuclear Technologist: Imagene Riches, CNMT    Rest Procedure:  Myocardial perfusion imaging was performed at rest 45 minutes following the intravenous administration of Technetium 64m Sestamibi. Stress Procedure:  The patient received IV Lexiscan 0.4 mg over 15-seconds.  Technetium 55m Sestamibi injected at 30-seconds.  There were no significant changes with Lexiscan.  Quantitative spect images were obtained after a 45 minute delay.  Transient Ischemic Dilatation (Normal <1.22):  0.93   QGS EDV:  80 ml QGS ESV:  29 ml LV Ejection Fraction: 64%       Rest ECG: NSR with first degree AV block.   Stress ECG: No significant ST segment change suggestive of ischemia.   QPS Raw Data Images:  Acquisition technically good; normal left ventricular size. Stress Images:  Normal homogeneous uptake in all areas of the myocardium. Rest Images:  Normal homogeneous uptake in all areas of the myocardium. Subtraction (SDS):  No evidence of ischemia.   Impression Exercise Capacity:  Lexiscan with no exercise. BP Response:  Normal blood pressure response. Clinical Symptoms:  No significant symptoms noted. ECG Impression:  No significant ST segment change suggestive of ischemia. Comparison with Prior Nuclear Study: No previous nuclear study performed   Overall Impression:  Normal stress nuclear  study.   LV Wall Motion:  NL LV Function; NL Wall Motion     Olga Millers, MD    Echocardiogram 05/13/2021  IMPRESSIONS     1. Hyperdynamic LV function; moderate LVH with proximal septal  thickening.   2. Left ventricular ejection fraction, by estimation, is >75%. The left  ventricle has hyperdynamic function. The left ventricle has no regional  wall motion abnormalities. There is moderate left ventricular hypertrophy.  Left ventricular diastolic  parameters are consistent with Grade I diastolic dysfunction (impaired  relaxation).   3. Right ventricular systolic function is mildly reduced. The right  ventricular size is normal.   4. The mitral valve is normal in structure. No evidence of mitral valve  regurgitation. No evidence of mitral stenosis.   5. The aortic valve is tricuspid. Aortic valve regurgitation is not  visualized. Mild to moderate aortic valve sclerosis/calcification is  present, without any evidence of aortic stenosis.  Past Medical History:  Diagnosis Date   Allergy    Arthritis    Blindness    Coronary artery disease    Diabetes mellitus without complication (HCC)    Glaucoma    Hyperlipidemia    Hypertension    Myocardial infarction [facility])    Peripheral vascular disease (HCC)    Stroke [facility])    Past Surgical History:  Procedure Laterality Date   EYE SURGERY       Current Meds  Medication Sig   acetaminophen (TYLENOL) 325 MG tablet Take 2 tablets (650 mg total) by mouth every 6 (six) hours as needed for mild pain (temp > 101.5).   LUMIGAN 0.01 % SOLN Place 1 drop into both eyes at bedtime.   nitrofurantoin, macrocrystal-monohydrate, (MACROBID) 100 MG capsule Take 100 mg by mouth daily.   pantoprazole (PROTONIX) 40 MG tablet TAKE 1 TABLET BY MOUTH EVERY DAY   SIMBRINZA 1-0.2 % SUSP Place 1 drop into both eyes daily.   tamsulosin (FLOMAX) 0.4 MG CAPS capsule Take 0.4 mg by mouth daily.   VOLTAREN 1 % GEL APPLY 4 GRAMS TOPICALLY 2 TIMES DAILY AS  NEEDED.     Allergies:   Clonidine derivatives and Milk-related compounds   Social History   Tobacco Use   Smoking status: Former    Types: Cigarettes    Quit date: 06/06/2002    Years since quitting: 19.1   Smokeless tobacco: Never   Tobacco comments:    Quit at age 65  Vaping Use   Vaping Use: Never used  Substance Use Topics   Alcohol use: No    Alcohol/week: 0.0 standard drinks   Drug use: No  Family Hx: The patient's family history includes Alcohol abuse in her brother; Cancer in her brother and father; Diabetes in her brother, mother, and sister; Heart attack in her father; Hypertension in her father and sister; Peripheral vascular disease in her mother; Stroke in her mother; Varicose Veins in her brother and father.  ROS:   Please see the history of present illness.     All other systems reviewed and are negative.   Labs/Other Tests and Data Reviewed:    Recent Labs: 09/08/2020: B Natriuretic Peptide 1,007.4 05/12/2021: ALT 11; BUN 8; Creatinine, Ser 0.65; Hemoglobin 13.8; Magnesium 1.9; Platelets 252; Potassium 4.7; Sodium 139; TSH 0.688   Recent Lipid Panel Lab Results  Component Value Date/Time   CHOL 184 08/25/2014 10:51 AM   TRIG 78 08/25/2014 10:51 AM   HDL 62 08/25/2014 10:51 AM   CHOLHDL 3.0 08/25/2014 10:51 AM   LDLCALC 106 (H) 08/25/2014 10:51 AM    Wt Readings from Last 3 Encounters:  08/10/21 140 lb (63.5 kg)  05/11/21 147 lb 0.8 oz (66.7 kg)  01/01/21 147 lb (66.7 kg)     Exam:    Vital Signs:  Ht 5\' 6"  (1.676 m)   Wt 140 lb (63.5 kg)   BMI 22.60 kg/m    Well nourished, well developed female in no  acute distress.   ASSESSMENT & PLAN:    1.  Syncope-denies recurrent episodes.  Continues to maintain hydration.  Cardiac event monitor showed sinus rhythm, sinus tachycardia, and no arrhythmias.  Details above.  Echocardiogram showed LVEF greater than 75%. Continue p.o. hydration, liberalize sodium Heart healthy diet Increase  physical activity as tolerated Continue chair/bed physical therapy exercises  Chronic combined systolic and diastolic CHF-continues to be bedbound.  Weight stable.  Echocardiogram showed hyperdynamic LV function and G1 DD.  Details above. Continue losartan Heart healthy low-sodium diet-salty 6 given Increase physical activity as tolerated  Essential hypertension-BP today 136/70.  Well-controlled at home.  Continue losartan Heart healthy low-sodium diet  History of pulmonary embolus-denies bleeding issues.  Xarelto stopped by PCP.   Follows with PCP     COVID-19 Education: The signs and symptoms of COVID-19 were discussed with the patient and how to seek care for testing (follow up with PCP or arrange E-visit).  The importance of social distancing was discussed today.  Patient Risk:   After full review of this patients clinical status, I feel that they are at least moderate risk at this time.  Time:   Today, I have spent 14 minutes with the patient with telehealth technology discussing recent hospitalization, medications, diet, physical therapy, and cardiac event monitor.     Medication Adjustments/Labs and Tests Ordered: Current medicines are reviewed at length with the patient today.  Concerns regarding medicines are outlined above.   Tests Ordered: No orders of the defined types were placed in this encounter.  Medication Changes: No orders of the defined types were placed in this encounter.   Disposition: Follow-up in 6 months with Dr. Gwenlyn Found  Signed, Jossie Ng. Torry Istre NP-C    04/23/2019 11:58 AM    [place] [address] Office [phone] Fax [phone]

## 2021-08-10 ENCOUNTER — Ambulatory Visit (INDEPENDENT_AMBULATORY_CARE_PROVIDER_SITE_OTHER): Payer: Federal, State, Local not specified - PPO | Admitting: General Practice

## 2021-08-10 ENCOUNTER — Encounter: Payer: Self-pay | Admitting: General Practice

## 2021-08-10 VITALS — BP 136/72 | Ht 66.0 in | Wt 140.0 lb

## 2021-08-10 DIAGNOSIS — Z86711 Personal history of pulmonary embolism: Secondary | ICD-10-CM | POA: Diagnosis not present

## 2021-08-10 DIAGNOSIS — I5042 Chronic combined systolic (congestive) and diastolic (congestive) heart failure: Secondary | ICD-10-CM

## 2021-08-10 DIAGNOSIS — R55 Syncope and collapse: Secondary | ICD-10-CM

## 2021-08-10 DIAGNOSIS — I10 Essential (primary) hypertension: Secondary | ICD-10-CM

## 2021-08-10 NOTE — Patient Instructions (Signed)

## 2021-08-31 ENCOUNTER — Emergency Department (HOSPITAL_COMMUNITY): Payer: Federal, State, Local not specified - PPO

## 2021-08-31 ENCOUNTER — Encounter (HOSPITAL_COMMUNITY): Payer: Self-pay | Admitting: Emergency Medicine

## 2021-08-31 ENCOUNTER — Emergency Department (HOSPITAL_COMMUNITY)
Admission: EM | Admit: 2021-08-31 | Discharge: 2021-09-01 | Disposition: A | Discharge status: Home / Self Care | Payer: Federal, State, Local not specified - PPO | Attending: Emergency Medicine | Admitting: Emergency Medicine

## 2021-08-31 ENCOUNTER — Other Ambulatory Visit: Payer: Self-pay

## 2021-08-31 DIAGNOSIS — L538 Other specified erythematous conditions: Secondary | ICD-10-CM | POA: Diagnosis not present

## 2021-08-31 DIAGNOSIS — I110 Hypertensive heart disease with heart failure: Secondary | ICD-10-CM | POA: Insufficient documentation

## 2021-08-31 DIAGNOSIS — E119 Type 2 diabetes mellitus without complications: Secondary | ICD-10-CM | POA: Insufficient documentation

## 2021-08-31 DIAGNOSIS — Z20822 Contact with and (suspected) exposure to covid-19: Secondary | ICD-10-CM | POA: Diagnosis not present

## 2021-08-31 DIAGNOSIS — Z7901 Long term (current) use of anticoagulants: Secondary | ICD-10-CM | POA: Diagnosis not present

## 2021-08-31 DIAGNOSIS — Z87891 Personal history of nicotine dependence: Secondary | ICD-10-CM | POA: Diagnosis not present

## 2021-08-31 DIAGNOSIS — Z79899 Other long term (current) drug therapy: Secondary | ICD-10-CM | POA: Insufficient documentation

## 2021-08-31 DIAGNOSIS — R053 Chronic cough: Secondary | ICD-10-CM

## 2021-08-31 DIAGNOSIS — Z8616 Personal history of COVID-19: Secondary | ICD-10-CM | POA: Insufficient documentation

## 2021-08-31 DIAGNOSIS — Z8673 Personal history of transient ischemic attack (TIA), and cerebral infarction without residual deficits: Secondary | ICD-10-CM | POA: Diagnosis not present

## 2021-08-31 DIAGNOSIS — I2510 Atherosclerotic heart disease of native coronary artery without angina pectoris: Secondary | ICD-10-CM | POA: Insufficient documentation

## 2021-08-31 DIAGNOSIS — R21 Rash and other nonspecific skin eruption: Secondary | ICD-10-CM | POA: Insufficient documentation

## 2021-08-31 DIAGNOSIS — I5043 Acute on chronic combined systolic (congestive) and diastolic (congestive) heart failure: Secondary | ICD-10-CM | POA: Insufficient documentation

## 2021-08-31 DIAGNOSIS — R059 Cough, unspecified: Secondary | ICD-10-CM | POA: Diagnosis present

## 2021-08-31 LAB — BASIC METABOLIC PANEL
Anion gap: 5 (ref 5–15)
BUN: 8 mg/dL (ref 8–23)
CO2: 29 mmol/L (ref 22–32)
Calcium: 8.4 mg/dL — ABNORMAL LOW (ref 8.9–10.3)
Chloride: 106 mmol/L (ref 98–111)
Creatinine, Ser: 0.5 mg/dL (ref 0.44–1.00)
GFR, Estimated: >60 mL/min (ref >60)
Glucose, Bld: 105 mg/dL — ABNORMAL HIGH (ref 70–99)
Potassium: 3.7 mmol/L (ref 3.5–5.1)
Sodium: 140 mmol/L (ref 135–145)

## 2021-08-31 LAB — CBC WITH DIFFERENTIAL/PLATELET
Abs Immature Granulocytes: 0.01 10*3/uL (ref 0.00–0.07)
Basophils Absolute: 0 10*3/uL (ref 0.0–0.1)
Basophils Relative: 0 %
Eosinophils Absolute: 0.2 10*3/uL (ref 0.0–0.5)
Eosinophils Relative: 5 %
HCT: 38.9 % (ref 36.0–46.0)
Hemoglobin: 12.5 g/dL (ref 12.0–15.0)
Immature Granulocytes: 0 %
Lymphocytes Relative: 24 %
Lymphs Abs: 1.1 10*3/uL (ref 0.7–4.0)
MCH: 31.3 pg (ref 26.0–34.0)
MCHC: 32.1 g/dL (ref 30.0–36.0)
MCV: 97.5 fL (ref 80.0–100.0)
Monocytes Absolute: 0.6 10*3/uL (ref 0.1–1.0)
Monocytes Relative: 12 %
Neutro Abs: 2.8 10*3/uL (ref 1.7–7.7)
Neutrophils Relative %: 59 %
Platelets: 255 10*3/uL (ref 150–400)
RBC: 3.99 MIL/uL (ref 3.87–5.11)
RDW: 12.7 % (ref 11.5–15.5)
WBC: 4.8 10*3/uL (ref 4.0–10.5)
nRBC: 0 % (ref 0.0–0.2)

## 2021-08-31 LAB — RESP PANEL BY RT-PCR (FLU A&B, COVID) ARPGX2
Influenza A by PCR: NEGATIVE
Influenza B by PCR: NEGATIVE
SARS Coronavirus 2 by RT PCR: NEGATIVE

## 2021-08-31 LAB — BRAIN NATRIURETIC PEPTIDE: B Natriuretic Peptide: 124.4 pg/mL — ABNORMAL HIGH (ref 0.0–100.0)

## 2021-08-31 MED ORDER — HYDROCORTISONE 1 % EX CREA: TOPICAL_CREAM | CUTANEOUS | 0 refills | Status: AC

## 2021-08-31 MED ORDER — ALBUTEROL SULFATE HFA 108 (90 BASE) MCG/ACT IN AERS: 2.0000 | INHALATION_SPRAY | RESPIRATORY_TRACT | Status: DC | PRN

## 2021-08-31 MED ORDER — DOXYCYCLINE HYCLATE 100 MG PO CAPS: 100.0000 mg | ORAL_CAPSULE | Freq: Two times a day (BID) | ORAL | 0 refills | Status: AC

## 2021-08-31 NOTE — ED Provider Notes (Addendum)
[facility]-EMERGENCY DEPT Provider Note   CSN: [id number] Arrival date & time: 08/31/21  2022     History Chief Complaint  Patient presents with   Cough    Kaitlin [facility] is a 87 y.o. female.  HPI     87 year old female comes in with chief complaint of cough.  Patient has history of diabetes, hypertension and hyperlipidemia.  She also has history of CAD and has blindness.  She resides here with her niece.  Patient reports that she has been having a cough for the last several days.  She is unsure what color the phlegm is, but she has been having thick sputum.  She was on antibiotics recently.  Niece at the bedside reports that she got ill few weeks back but has recovered.  Her aunt continues to have cough and is producing green phlegm.  She also noticed some rash over patient's left axilla 3 weeks ago that has spread more recently.  Past Medical History:  Diagnosis Date   Allergy    Arthritis    Blindness    Coronary artery disease    Diabetes mellitus without complication (HCC)    Glaucoma    Hyperlipidemia    Hypertension    Myocardial infarction [facility])    Peripheral vascular disease (HCC)    Stroke [facility])     Patient Active Problem List   Diagnosis Date Noted   Ileus (HCC) 05/12/2021   Dehydration 05/11/2021   Syncope 05/11/2021   Chronic combined systolic (congestive) and diastolic (congestive) heart failure (HCC) 05/11/2021   Pulmonary embolism (HCC) 01/02/2021   Acute on chronic combined systolic and diastolic CHF (congestive heart failure) (HCC)    Severe sepsis (HCC)    Non-ST elevation (NSTEMI) myocardial infarction (HCC)    Acute respiratory failure with hypoxia (HCC)    Palliative care by specialist    Goals of care, counseling/discussion    General weakness    Controlled type 2 diabetes mellitus with hyperglycemia (HCC) 09/01/2020   COVID 09/01/2020   Acute lower UTI 09/01/2020   Acute hypoxemic respiratory failure due to COVID-19  (HCC) 09/01/2020   COVID-19 virus infection 08/31/2020   Aortic dissection (HCC) 08/17/2018   Hypertensive emergency 08/17/2018   Abnormal EKG 01/30/2018   Osteoarthritis of right knee 12/04/2017   MCI (mild cognitive impairment) 06/30/2015   Essential hypertension 08/20/2014   Hyperlipidemia 08/20/2014   Peripheral arterial disease (HCC) 08/20/2014   Carotid artery disease (HCC) 06/06/2014   DM2 (diabetes mellitus, type 2) (HCC) 05/13/2014   History of CVA (cerebrovascular accident) 05/13/2014   History of MI (myocardial infarction) 05/13/2014    Past Surgical History:  Procedure Laterality Date   EYE SURGERY       OB History   No obstetric history on file.     Family History  Problem Relation Age of Onset   Diabetes Mother    Stroke Mother    Peripheral vascular disease Mother        amputation   Cancer Brother    Diabetes Brother    Alcohol abuse Brother    Varicose Veins Brother    Hypertension Father    Varicose Veins Father    Heart attack Father    Cancer Father    Diabetes Sister    Hypertension Sister     Social History   Tobacco Use   Smoking status: Former    Types: Cigarettes    Quit date: 06/06/2002    Years since quitting: 19.2  Smokeless tobacco: Never   Tobacco comments:    Quit at age 42  Vaping Use   Vaping Use: Never used  Substance Use Topics   Alcohol use: No    Alcohol/week: 0.0 standard drinks   Drug use: No    Home Medications Prior to Admission medications   Medication Sig Start Date End Date Taking? Authorizing Provider  doxycycline (VIBRAMYCIN) 100 MG capsule Take 1 capsule (100 mg total) by mouth 2 (two) times daily. 08/31/21  Yes Derwood Kaplan, MD  hydrocortisone cream 1 % Apply to affected area 2 times daily 08/31/21  Yes Derwood Kaplan, MD  acetaminophen (TYLENOL) 325 MG tablet Take 2 tablets (650 mg total) by mouth every 6 (six) hours as needed for mild pain (temp > 101.5). 08/22/18   Elgergawy, Leana Roe, MD   losartan (COZAAR) 25 MG tablet Take 1 tablet (25 mg total) by mouth daily. 05/15/21 06/14/21  Jerald Kief, MD  LUMIGAN 0.01 % SOLN Place 1 drop into both eyes at bedtime. 09/10/19   [provider]  nitrofurantoin, macrocrystal-monohydrate, (MACROBID) 100 MG capsule Take 100 mg by mouth daily.    [provider]  pantoprazole (PROTONIX) 40 MG tablet TAKE 1 TABLET BY MOUTH EVERY DAY 11/15/19   Runell Gess, MD  rivaroxaban (XARELTO) 20 MG TABS tablet Take 1 tablet (20 mg total) by mouth daily. Patient not taking: Reported on 08/10/2021 05/15/21 06/14/21  Jerald Kief, MD  senna (SENOKOT) 8.6 MG TABS tablet Take 1 tablet (8.6 mg total) by mouth daily. Patient not taking: Reported on 08/10/2021 05/15/21   Jerald Kief, MD  [facility] 1-0.2 % SUSP Place 1 drop into both eyes daily. 02/13/20   [provider]  tamsulosin (FLOMAX) 0.4 MG CAPS capsule Take 0.4 mg by mouth daily.    [provider]  VOLTAREN 1 % GEL APPLY 4 GRAMS TOPICALLY 2 TIMES DAILY AS NEEDED. 04/25/16   Runell Gess, MD    Allergies    Clonidine derivatives and Milk-related compounds  Review of Systems   Review of Systems  Constitutional:  Positive for activity change.  Skin:  Positive for rash.  Allergic/Immunologic: Negative for immunocompromised state.  All other systems reviewed and are negative.  Physical Exam Updated Vital Signs BP 130/66    Pulse 73    Temp 98.8 F (37.1 C) (Oral)    Resp 18    Ht 5\' 6"  (1.676 m)    Wt 63.5 kg    SpO2 99%    BMI 22.60 kg/m   Physical Exam Vitals and nursing note reviewed.  Constitutional:      Appearance: She is well-developed.  HENT:     Head: Atraumatic.  Cardiovascular:     Rate and Rhythm: Normal rate.  Pulmonary:     Effort: Pulmonary effort is normal.     Breath sounds: Rales present.     Comments: Bibasilar rales, Right worse than Left Musculoskeletal:     Cervical back: Normal range of motion and neck supple.   Skin:    General: Skin is warm and dry.     Findings: Rash present. No bruising.     Comments: Erythematous plaque in the axilla extending to elbow  Neurological:     Mental Status: She is alert and oriented to person, place, and time.    ED Results / Procedures / Treatments   Labs (all labs ordered are listed, but only abnormal results are displayed) Labs Reviewed  BASIC METABOLIC PANEL -  Abnormal; Notable for the following components:      Result Value   Glucose, Bld 105 (*)    Calcium 8.4 (*)    All other components within normal limits  BRAIN NATRIURETIC PEPTIDE - Abnormal; Notable for the following components:   B Natriuretic Peptide 124.4 (*)    All other components within normal limits  RESP PANEL BY RT-PCR (FLU A&B, COVID) ARPGX2  CBC WITH DIFFERENTIAL/PLATELET    EKG None  Radiology DG Chest 2 View  Result Date: 08/31/2021 CLINICAL DATA:  Shortness of breath EXAM: CHEST - 2 VIEW COMPARISON:  05/11/2021 FINDINGS: Bibasilar opacities, likely atelectasis. Heart is normal size. Aortic atherosclerosis. No visible significant effusions or acute bony abnormality. IMPRESSION: Bibasilar opacities, likely atelectasis. Electronically Signed   By: Charlett Nose M.D.   On: 08/31/2021 21:15    Procedures Procedures   Medications Ordered in ED Medications  albuterol (VENTOLIN HFA) 108 (90 Base) MCG/ACT inhaler 2 puff (has no administration in time range)    ED Course  I have reviewed the triage vital signs and the nursing notes.  Pertinent labs & imaging results that were available during my care of the patient were reviewed by me and considered in my medical decision making (see chart for details).    MDM Rules/Calculators/A&P                           Pt comes in with cc of rash and cough.  Patient with history of diabetes, she is essentially bedbound per niece.  Cough is chronic in nature.  No chest pain, no pleurisy.  Patient had some URI-like symptoms about a  month ago, cough has lingered around.  There has been some evolution and the phlegm it appears, but no fevers or chills.  There were bibasilar rales, right worse than left.  X-ray is reassuring.  There is a rash over the left breast.  We considered shingles in the differential, but the symptoms have been present now for several days -which makes shingles less likely.  There was some pain over the site.  PCP follow-up has been difficult because of patient's difficulty in ambulating.   We will start her on hydrocortisone ointment.  We will advised her to follow-up with PCP, the symptoms progressed.  Final Clinical Impression(s) / ED Diagnoses Final diagnoses:  Rash  Chronic cough    Rx / DC Orders ED Discharge Orders          Ordered    hydrocortisone cream 1 %        08/31/21 2317    doxycycline (VIBRAMYCIN) 100 MG capsule  2 times daily        08/31/21 2317             Derwood Kaplan, MD 08/31/21 3524    Derwood Kaplan, MD 08/31/21 [phone]

## 2021-08-31 NOTE — ED Triage Notes (Signed)
Pt arrived from home via [facility]. Pt had a respiratory infection that was treated with antibiotics. Her antibiotics finished last Friday but her cough has not improved. Pt also has a rash under her left breast and into her left armpit. Pt has hx of dementia and blindness.

## 2021-08-31 NOTE — Discharge Instructions (Addendum)
Apply hydrocortisone to the rash. If the rash starts spreading further, you start noticing skin sloughing off  -please return to the ER immediately.  Doxycycline is an antibiotic that has been prescribed.  It can help with both pneumonia and skin infection.  We recommend that you start taking it only if the cough starts getting worse, or there is any new fevers coming.  Please return to the ER if your symptoms worsen; you have increased pain, fevers, chills, inability to keep any medications down, confusion. Otherwise see the outpatient doctor as requested.

## 2021-09-23 ENCOUNTER — Telehealth: Payer: Self-pay | Admitting: *Deleted

## 2021-09-23 ENCOUNTER — Telehealth: Payer: Self-pay

## 2021-09-23 NOTE — Telephone Encounter (Signed)
413 pm.  Message received from [place] requesting a call back regarding patient status.  Return call made and [name] advised patient was having issues with constipation before Christmas.  Stools were clay-like in texture.   Patient was given miralax 2x daily for 2 days in addition to her scheduled senna.  Loose stools have been present today x 4.  Patient has vomited Leanndra Pember emesis and reported nausea.  Niece was uncertain if this was fecal matter and did not recall the smell.  She has notified PCP and they requested PC be contacted.  Advised that a visit could not be made today but I would consult with Alysia Penna, [facility] RN regarding a visit for tomorrow.   Spoke with Monisha, RN and she will follow up with niece regarding a visit time for tomorrow.

## 2021-09-23 NOTE — Telephone Encounter (Signed)
Received a call from Vance Gather RN with our palliative care team. She provided me with an update regarding patient experiencing constipation and had an episode of nausea/vomiting. I called and spoke with [name] and RN visit is scheduled for tomorrow 09/24/21@12p .

## 2021-09-24 ENCOUNTER — Other Ambulatory Visit: Payer: Federal, State, Local not specified - PPO | Admitting: *Deleted

## 2021-09-24 ENCOUNTER — Other Ambulatory Visit: Payer: Self-pay

## 2021-09-24 DIAGNOSIS — Z515 Encounter for palliative care: Secondary | ICD-10-CM

## 2021-09-24 NOTE — Progress Notes (Signed)
[facility] COMMUNITY PALLIATIVE CARE RN NOTE  PATIENT NAME: Kaitlin Smith DOB: 08/26/1930 MRN: [id number]  PRIMARY CARE PROVIDER: Alysia Penna, MD  RESPONSIBLE PARTY: [name] (niece) Acct ID - Guarantor Home Phone Work Phone Relationship Acct Type  [id number] PARRIE, RASCO [phone]  Self P/F     [address], [place], [place] [address]   Covid-19 Pre-screening Negative  PRN RN visit completed today with patient and her niece-Deborah. Yesterday, patient was experiencing several occurrences of loose stools, along with nausea/vomiting x 3 and spitting up phlegm. On Tuesday this week, 09/21/21, patient had not had a BM in several days. She gave patient Colace 250 mg x 2 tabs, Sennosides 25 mg x 3 tablets along with Miralax. After this is when the loose stools started. She has not had any additional loose stools since yesterday. She denies nausea today and was able to eat breakfast. I performed a rectal check and there is a moderate amount of firm stool noted high up in her rectal vault. I administered a suppository during visit today. She denies any abdominal pain or discomfort. Abdomen is soft and non-distended. Flatulence present along with positive bowel sounds in all 4 quadrants. I contacted and spoke with Marchelle Folks with Dr. Alphonsus Sias office. MD wants an enema administered, and if she still does not have a BM then will need to go to the ED. She is to continue Miralax daily, Colace and Sennosides. I contacted niece to make her aware of this information. I will follow up with patient on Monday 09/27/21. Niece is Adult nurse.   (Duration of visit and documentation 60 minutes)   Candiss Norse, RN BSN

## 2021-09-27 ENCOUNTER — Other Ambulatory Visit: Payer: Self-pay

## 2021-09-27 ENCOUNTER — Other Ambulatory Visit: Payer: Federal, State, Local not specified - PPO | Admitting: *Deleted

## 2021-09-27 DIAGNOSIS — Z515 Encounter for palliative care: Secondary | ICD-10-CM

## 2021-09-27 NOTE — Progress Notes (Signed)
[facility] COMMUNITY PALLIATIVE CARE RN NOTE  PATIENT NAME: Kaitlin Smith DOB: 07/15/1930 MRN: [id number]  PRIMARY CARE PROVIDER: Alysia Penna, MD  RESPONSIBLE PARTY: [name] (niece) Acct ID - Guarantor Home Phone Work Phone Relationship Acct Type  [id number] SMT, LOKEY [phone]  Self P/F     [address], [place], [place] [address]   Covid-19 Pre-screening Negative  RN visit follow up visit completed today to assess constipation. Patient's niece [name] is present for the visit. She says that patient still has not had a BM. [name] gave her another suppository over the weekend and could feel a lot of stool in her rectum. She continues with loose stools going around her impaction. She was instructed on Friday to give her an enema, however she did not feel comfortable. I checked her rectal vault today and there was still a large amount of hard stool present. I administered a Fleets enema and patient had a large bowel movement. I rechecked her rectum and did not feel anymore stool. She is to continue with Miralax daily and stool softener. Instructed her that she can give a suppository every 3 days if no BM. She is to push fluids as much as possible. I called Dr. Alphonsus Sias office and left a voicemail for his CMA to provide with an update and advised that she could call me back if needed. Left my contact information on voicemail and provided an update to her palliative care provider, Dr. Bufford Spikes as well. Will continue to monitor.  (Duration of visit and documentation 90 minutes)   Candiss Norse, RN BSN

## 2021-09-29 ENCOUNTER — Other Ambulatory Visit: Payer: Self-pay

## 2021-09-29 ENCOUNTER — Other Ambulatory Visit: Payer: Federal, State, Local not specified - PPO | Admitting: *Deleted

## 2021-09-29 DIAGNOSIS — Z515 Encounter for palliative care: Secondary | ICD-10-CM

## 2021-09-30 ENCOUNTER — Other Ambulatory Visit: Payer: Federal, State, Local not specified - PPO | Admitting: *Deleted

## 2021-09-30 ENCOUNTER — Other Ambulatory Visit: Payer: Self-pay

## 2021-09-30 DIAGNOSIS — Z515 Encounter for palliative care: Secondary | ICD-10-CM

## 2021-09-30 NOTE — Progress Notes (Signed)
[facility] COMMUNITY PALLIATIVE CARE RN NOTE  PATIENT NAME: Nini Roudebush DOB: 1930-09-14 MRN: [id number]  PRIMARY CARE PROVIDER: Velna Hatchet, MD  RESPONSIBLE PARTY: Candyce Churn (niece) Acct ID - Guarantor Home Phone Work Phone Relationship Acct Type  [id number] AHRI, STEGENGA [phone]  Self P/F     [address], [place], [place] [address]   Due to the COVID-19 crisis, this virtual check-in visit was done via telephone from my office and it was initiated and consent by this patient and or family.  RN follow up palliative care telephonic encounter completed with patient's niece [name]. She reports that since I disimpacted patient and administered an enema 2 days ago, patient has not had a solid bowel movement. She has only had 1 small loose stool since. Patient also does not eat much. Niece checked her rectum and did not feel any stool in her rectal vault. I explained that she may not be having many stools due to her poor intake. Niece will continue with Miralax daily along with stool softener to prevent constipation as patient is very susceptible to this. She also denies any nausea/vomiting or abdominal discomfort. Will continue to monitor.    (Duration of visit and documentation 20 minutes   Daryl Eastern, RN BSN

## 2021-10-05 NOTE — Progress Notes (Signed)
[facility] COMMUNITY PALLIATIVE CARE RN NOTE  PATIENT NAME: Nadalee Neiswender DOB: [birth year] MRN: [id number]  PRIMARY CARE PROVIDER: Alysia Penna, MD  RESPONSIBLE PARTY: [name] (niece) Acct ID - Guarantor Home Phone Work Phone Relationship Acct Type  [id number] DELAYNE, SANZO [phone]  Self P/F     [address], [place], [place] [address]   Due to the COVID-19 crisis, this virtual check-in visit was done via telephone from my office and it was initiated and consent by this patient and or family.  RN telephonic encounter completed with patient's niece, [name]. She is concerned that patient may now have a UTI as she reports she is having difficulty urinating and wants to know if she could be tested for this. She denies any fever, chills, dysuria, foul odor or increased confusion. Niece states patient is saying it is taking longer for her urine flow to start. Patient has a poor appetite and fluid intake. I instructed [name] to push fluids with patient first to see if this makes a difference. Explained that an in and out cath would have to be performed due to patient incontinence to obtain a specimen which could introduce bacteria to her urinary tract. She is agreeable with this plan and will contact [facility] if patient is still having issues or develops new symptoms. She is appreciative.    (Duration of visit and documentation 20 minutes)    Candiss Norse, RN BSN

## 2021-11-04 ENCOUNTER — Other Ambulatory Visit: Payer: Federal, State, Local not specified - PPO | Admitting: *Deleted

## 2021-11-04 ENCOUNTER — Other Ambulatory Visit: Payer: Self-pay

## 2021-11-04 DIAGNOSIS — Z515 Encounter for palliative care: Secondary | ICD-10-CM

## 2021-11-04 NOTE — Progress Notes (Signed)
[facility] COMMUNITY PALLIATIVE CARE RN NOTE  PATIENT NAME: Kaitlin Smith DOB: [birth year] MRN: [id number]  PRIMARY CARE PROVIDER: Alysia Penna, MD  RESPONSIBLE PARTY: [name] (niece) Acct ID - Guarantor Home Phone Work Phone Relationship Acct Type  [id number] [name], [name] [phone]  Self P/F     [address], [place], [place] [address]    RN telephonic encounter completed with patient's niece-Deborah. She reports that patient has not had a good bowel movement since Monday. She has been giving her stool softeners, laxative tablets and Miralax (on occasion). She believes that patient is constipated again. She have her a suppository yesterday but only a small amount came out. She checked patient's rectum and states that she did not feel any hard stool but is convinced that she is constipated. RN in person visit scheduled for tomorrow 11/05/21 at 1:00pm to assess constipation.   (Duration of visit and documentation 15 minutes)   Candiss Norse, RN BSN

## 2021-11-05 ENCOUNTER — Other Ambulatory Visit: Payer: Federal, State, Local not specified - PPO | Admitting: *Deleted

## 2021-11-05 ENCOUNTER — Other Ambulatory Visit: Payer: Self-pay

## 2021-11-05 DIAGNOSIS — Z515 Encounter for palliative care: Secondary | ICD-10-CM

## 2021-11-07 NOTE — Progress Notes (Signed)
[facility] COMMUNITY PALLIATIVE CARE RN NOTE  PATIENT NAME: Wallis Spizzirri DOB: 1930-02-14 MRN: [id number]  PRIMARY CARE PROVIDER: Alysia Penna, MD  RESPONSIBLE PARTY: [name] (niece) Acct ID - Guarantor Home Phone Work Phone Relationship Acct Type  [id number] AAHANA, ELZA [phone]  Self P/F     [address], [place], [place] [address]   Covid-19 Pre-screening Negative  RN visit completed with patient and her niece-Deborah. Patient has been having issues with constipation. Patient had a BM on Monday. [name] gave a suppository on Wednesday and she had just a small firm piece of stool come out. No bowel movement yesterday. I performed a rectal check on her today and she did have hard stool present in the rectal vault but was too high to disimpact. I gave her a Fleets enema and she had a very large bowel movement. Contents afterwards were very soft and runny. Cleaned patient up, placed a clean brief on her and repositioned her in bed. [name] is giving a stool softener, laxative (something like Senna) and only giving Miralax occasionally. I recommended that she give her Miralax on a daily basis, even if only a half dose. She is eating some better. She eats a good breakfast, a snack for lunch and for dinner maybe a sandwich or some soup. Niece is appreciative of visit and intervention. Palliative care will continue to follow. Update provided to Dr. Renato Gails.   (Duration of visit and documentation 90 minutes)   Candiss Norse, RN BSN

## 2022-01-27 ENCOUNTER — Other Ambulatory Visit: Payer: Federal, State, Local not specified - PPO | Admitting: *Deleted

## 2022-01-27 DIAGNOSIS — Z515 Encounter for palliative care: Secondary | ICD-10-CM

## 2022-01-28 ENCOUNTER — Other Ambulatory Visit: Payer: Federal, State, Local not specified - PPO | Admitting: *Deleted

## 2022-01-28 DIAGNOSIS — Z515 Encounter for palliative care: Secondary | ICD-10-CM

## 2022-01-28 NOTE — Progress Notes (Signed)
[facility] COMMUNITY PALLIATIVE CARE RN NOTE ? ?PATIENT NAME: Kaitlin Smith ?DOB: 1930-02-18 ?MRN: [id number] ? ?PRIMARY CARE PROVIDER: Alysia Penna, MD ? ?RESPONSIBLE PARTY: [name] (niece) ?Acct ID - Guarantor Home Phone Work Phone Relationship Acct Type  ?[id number] ARAIYA, TILMON [phone]  Self P/F  ?   [address], [place], [place] [address]  ? ?RN visit made to address constipation issues. Patient has not had a bowel movement in 3 days. [name] has been giving patient Miralax daily and Colace 250 mg x 2 tablets daily. She administered a suppository last night and patient's stool was liquid only. Performed rectal check. Large amount of soft, formed stool noted high up in rectal vault so was unable to disimpact. Administered a Fleets enema. Patient had a very large BM. Initial stool was loose, followed by large amount of formed soft stools. Patient reports feeling better. Made Dr. Renato Gails aware of visit today. Palliative care will continue to follow.  ? ? ?(Duration of visit and documentation 75 minutes) ? ? ?Candiss Norse, RN BSN ? ?

## 2022-01-31 NOTE — Progress Notes (Signed)
[facility] COMMUNITY PALLIATIVE CARE RN NOTE ? ?PATIENT NAME: Kaitlin Smith ?DOB: [birth year] ?MRN: [id number] ? ?PRIMARY CARE PROVIDER: Alysia Penna, MD ? ?RESPONSIBLE PARTY: [name] (niece) ?Acct ID - Guarantor Home Phone Work Phone Relationship Acct Type  ?[id number] TEEGHAN, HAMMER [phone]  Self P/F  ?   [address], [place], [place] [address]  ? ?Due to the COVID-19 crisis, this virtual check-in visit was done via telephone from my office and it was initiated and consent by this patient and or family. ? ?RN telephonic encounter completed with patient's niece [name]. She reports that patient has not had a BM in 2 days. Only watery stools noted. She feels that patient may be constipated. In this past when this has occurred, patient was in fact constipated. She is giving patient Miralax daily along with Colace 250 mg tablets x 2/day. Instructed Deborah to administer a suppository tonight and contact [facility] if no success by tomorrow morning. [name] verbalized understanding. Palliative care will continue to follow. ? ? ?(Duration of visit and documentation 10 minutes) ?\ ? ?Candiss Norse, RN BSN ? ?

## 2022-02-04 ENCOUNTER — Other Ambulatory Visit: Payer: Federal, State, Local not specified - PPO | Admitting: *Deleted

## 2022-02-04 DIAGNOSIS — Z515 Encounter for palliative care: Secondary | ICD-10-CM

## 2022-02-04 NOTE — Progress Notes (Signed)
[facility] COMMUNITY PALLIATIVE CARE RN NOTE  PATIENT NAME: Kaitlin Smith DOB: September 09, 1930 MRN: [id number]  PRIMARY CARE PROVIDER: Velna Hatchet, MD  RESPONSIBLE PARTY: Candyce Churn (niece) Acct ID - Guarantor Home Phone Work Phone Relationship Acct Type  [id number] AISSATOU, UPDIKE [phone]  Self P/F     [address], [place], [place] [address]   Due to the COVID-19 crisis, this virtual check-in visit was done via telephone from my office and it was initiated and consent by this patient and or family.  RN telephonic encounter completed with patient's niece [name]. She is concerned that patient may be impacted again. She reports no solid stool since I gave her an enema a week ago. For 2-3 days after the enema, her stools have been "mushy" with liquid and small pieces of stools evacuating. She has been giving her Miralax and stool softeners daily along with yogurt with probiotics. She gave her a suppository this morning. Stool is soft but high up in her rectal vault. Patient has been drinking Ensure for nutritional supplementation twice daily. She wonders if this may be causing constipation.  I reached out to Dr. Mariea Clonts who wants patient to try Lactulose 30 ml daily and Senna-S 2 tablets daily to see if this regimen will be more effective. She also recommends patient be given an enema. MD also states that Ensure more often causes diarrhea, not constipation. I called these medications in to [facility] to give her these instructions and advise of response regarding Ensure.. She verbalized understanding and will start new medications and administer enema today. I instructed [name] to let me know on Monday whether this was effective. Dr. Mariea Clonts says next steps will be to increase Lactulose to twice daily. I also let [name] know this as well. She is appreciative of follow up.   (Duration of visit and documentation 20 minutes)   Daryl Eastern, RN BSN

## 2022-02-07 ENCOUNTER — Other Ambulatory Visit: Payer: Federal, State, Local not specified - PPO | Admitting: *Deleted

## 2022-02-07 DIAGNOSIS — Z515 Encounter for palliative care: Secondary | ICD-10-CM

## 2022-02-07 NOTE — Progress Notes (Signed)
[facility] COMMUNITY PALLIATIVE CARE RN NOTE  PATIENT NAME: Kaitlin Smith DOB: 08-26-1930 MRN: [id number]  PRIMARY CARE PROVIDER: Alysia Penna, MD  RESPONSIBLE PARTY: [name] (niece) Acct ID - Guarantor Home Phone Work Phone Relationship Acct Type  [id number] NACOLE, FLUHR [phone]  Self P/F     [address], [place], [place] [address]   Due to the COVID-19 crisis, this virtual check-in visit was done via telephone from my office and it was initiated and consent by this patient and or family.  RN telephonic encounter completed with patient's niece-Deborah to follow up on patient's constipation. She advised that patient was finally able to have a regular bowel movement after giving her a suppository and starting the Senna-S tablets ordered by Dr. Renato Gails. She has started patient on the Lactulose today. [name] also requested information on facilities that provide respite for patients as she wants to travel out of town for a family reunion at the end of June. Provided [name] a list of facilities as requested and their phone numbers. Advised her that she will need to speak with the facility Social [facility] or [facility]. She is appreciative. Palliative care will continue to follow.    (Duration of visit and documentation 15 minutes)   Candiss Norse, RN BSN

## 2022-02-15 ENCOUNTER — Other Ambulatory Visit: Payer: Federal, State, Local not specified - PPO | Admitting: *Deleted

## 2022-02-15 DIAGNOSIS — Z515 Encounter for palliative care: Secondary | ICD-10-CM

## 2022-02-15 NOTE — Progress Notes (Signed)
[facility] COMMUNITY PALLIATIVE CARE RN NOTE  PATIENT NAME: Kaitlin Smith DOB: Oct 27, 1929 MRN: [id number]  PRIMARY CARE PROVIDER: Velna Hatchet, MD  RESPONSIBLE PARTY: Candyce Churn (niece) Acct ID - Guarantor Home Phone Work Phone Relationship Acct Type  [id number] SIRINE, GLORIA [phone]  Self P/F     [place], [place], [place] [address]   Due to the COVID-19 crisis, this virtual check-in visit was done via telephone from my office and it was initiated and consent by this patient and or family.  RN telephonic encounter completed with patient's niece-Deborah. She reports that this morning patient was c/o pain in her back near her shoulder blade. After this, niece says patient had a "substantial" bowel movement and the pain eased off. [name] was concerned that following the large soft BM there was some watery stools present, which is not uncommon for patient. Explained that since there was a large amount of soft stool she is more than likely ok. She can perform a rectal check to make sure. She is giving the new regimen of Lactulose 30 ml daily and Senna S 2 tablets daily. Advised that if she does not feel the 30 ml is working then she may increase to twice daily per Dr. Mariea Clonts. She verbalized understanding and is appreciative of advice. Palliative care will continue to follow.    (Duration of visit and documentation 15 minutes)   Daryl Eastern, RN BSN

## 2022-02-21 ENCOUNTER — Other Ambulatory Visit: Payer: Federal, State, Local not specified - PPO | Admitting: *Deleted

## 2022-02-21 DIAGNOSIS — Z515 Encounter for palliative care: Secondary | ICD-10-CM

## 2022-02-21 NOTE — Progress Notes (Incomplete)
[facility] COMMUNITY PALLIATIVE CARE RN NOTE  PATIENT NAME: Rabecca Klepinger DOB: Jul 09, 1930 MRN: [id number]  PRIMARY CARE PROVIDER: Velna Hatchet, MD  RESPONSIBLE PARTY: Candyce Churn (niece) Acct ID - Guarantor Home Phone Work Phone Relationship Acct Type  [id number] OMIE, GALIPEAU [phone]  Self P/F     [place], [place], [place] [address]   Due to the COVID-19 crisis, this virtual check-in visit was done via telephone from my office and it was initiated and consent by this patient and or family.  RN telephonic encounter completed with patient's niece [name]. She reports that patient has not had a BM in 2 days. She is currently giving Lactulose 30 ml daily and Senna S 2 tablets daily. Instructed niece to increase frequency to twice daily to see if this helps as recommended by Dr. Mariea Clonts. She is agreeable.   [name] also reports that she has called some facilities regarding respite from the list that was provided. She says she was told that our palliative SW would need to call them to provide them the information/forms they will need. Sh   Daryl Eastern, RN

## 2022-02-21 NOTE — Progress Notes (Signed)
[facility] COMMUNITY PALLIATIVE CARE RN NOTE  PATIENT NAME: Kaitlin Smith DOB: [birth year] MRN: [id number]  PRIMARY CARE PROVIDER: Alysia Penna, MD  RESPONSIBLE PARTY: [name] (niece) Acct ID - Guarantor Home Phone Work Phone Relationship Acct Type  [id number] SHEY, YOTT [phone]  Self P/F     [address], [place], [place] [address]   Due to the COVID-19 crisis, this virtual check-in visit was done via telephone from my office and it was initiated and consent by this patient and or family.  RN telephonic encounter completed with patient's niece [name]. She reports that patient has not had a BM in 2 days. She is currently giving Lactulose 30 ml daily and Senna S 2 tablets daily. Instructed niece to increase frequency to twice daily to see if this helps as recommended by Dr. Renato Gails. She is agreeable.   [name] also reports that she has called some facilities regarding respite from the list that was provided. She says she was told that our palliative SW would need to call them to provide them the information/forms they will need. She would like for [place] to contact [facility] as her first choice. I spoke with our palliative SW, M. Lonon. She reached out to [facility] and spoke with their SW. She advised that they would need an FL2 and they require a minimum of 7 days for respite. Called [name] and she is ok with this. She is requesting respite from 03/17/22 to 03/23/22. Made our SW aware who will start process of FL2 process. [name] is Adult nurse.   [name] also informed me that she herself had to go to the ED on Sat night until Sun morning. She says she knows it is stress related. She had a discussion with patient regarding long term SNF placement and says patient is agreeable. She is not looking to place patient as of yet, but could be a possibility in the future. Palliative care will continue to follow.   (Duration of visit and documentation 30 minutes)   Candiss Norse,  RN BSN

## 2022-02-22 ENCOUNTER — Other Ambulatory Visit: Payer: Federal, State, Local not specified - PPO

## 2022-02-22 DIAGNOSIS — Z515 Encounter for palliative care: Secondary | ICD-10-CM

## 2022-02-22 NOTE — Progress Notes (Signed)
COMMUNITY PALLIATIVE CARE SW NOTE  PATIENT NAME: Kaitlin Smith DOB: 1929/09/29 MRN: [id number]  PRIMARY CARE PROVIDER: Alysia Penna, MD  RESPONSIBLE PARTY:  Acct ID - Guarantor Home Phone Work Phone Relationship Acct Type  [id number] [name], [name] [phone]  Self P/F     [address], [place], [place] [address]   SOCIAL WORK TELEPHONIC ENCOUNTER  PC SW completed a telephonic encounter with patient's niece-Deborah. [name] is interested in respite care for patient at [facility]. SW provided education to her regarding respite process, paperwork needed (FL2, face sheet, med list). SW advised that she could assist with completing the FL2, however once the facility receives the information, it will be based on their ability to care for patient is she is extended a respite bed. [name] verbalized understanding. SW obtained a list of patient's medications from [name] to complete the FL2. SW will complete and forward to physician for signature.  No other concerns were noted.    [address] [place], [place]

## 2022-03-04 ENCOUNTER — Other Ambulatory Visit: Payer: Federal, State, Local not specified - PPO

## 2022-03-04 DIAGNOSIS — Z515 Encounter for palliative care: Secondary | ICD-10-CM

## 2022-03-04 NOTE — Progress Notes (Signed)
COMMUNITY PALLIATIVE CARE SW NOTE  PATIENT NAME: Kaitlin Smith DOB: April 30, 1930 MRN: [id number]  PRIMARY CARE PROVIDER: Alysia Penna, MD  RESPONSIBLE PARTY:  Acct ID - Guarantor Home Phone Work Phone Relationship Acct Type  [id number] ELYSABETH, AUST [phone]  Self P/F     [address], [place], [place] [address]   SOCIAL WORK TELEPHONIC ENCOUNTER  PC SW completed a telephonic encounter with patient's niece/PCG-Debbie. PCG advised that she is experiencing her own health issues and is anticipating that she may be admitted to the hospital. If this happens she is concern that patient will not have any care. SW advised her that patient's FL2, face sheet and nursing notes have been forwarded to [facility] to be considered for respite. SW encouraged her to contact them to discuss respite care with them. Debbie questioned why patient could not go on hospice. SW reviewed RN/NP notes and they did not indicate that patient was appropriate at this time. However she could call patient's primary care physician to discuss and request a consult. Eunice Blase stated stated she will follow-up with patient's doctor and Joetta Manners. SW reinforced access to support and encouraged her to call with any questions or concerns.    [address] [place], [place]

## 2024-04-19 DEATH — deceased
